# Patient Record
Sex: Male | Born: 1955 | Race: Black or African American | Hispanic: No | Marital: Single | Smoking: Current every day smoker
Health system: Southern US, Community
[De-identification: ages and names within clinical notes are randomized; demographics above are authoritative.]

## PROBLEM LIST (undated history)

## (undated) DIAGNOSIS — I10 Essential (primary) hypertension: Secondary | ICD-10-CM

## (undated) DIAGNOSIS — I513 Intracardiac thrombosis, not elsewhere classified: Secondary | ICD-10-CM

## (undated) DIAGNOSIS — I480 Paroxysmal atrial fibrillation: Secondary | ICD-10-CM

## (undated) DIAGNOSIS — I7101 Dissection of ascending aorta: Secondary | ICD-10-CM

## (undated) DIAGNOSIS — I5022 Chronic systolic (congestive) heart failure: Secondary | ICD-10-CM

## (undated) DIAGNOSIS — Z950 Presence of cardiac pacemaker: Secondary | ICD-10-CM

## (undated) DIAGNOSIS — G8220 Paraplegia, unspecified: Secondary | ICD-10-CM

## (undated) DIAGNOSIS — E782 Mixed hyperlipidemia: Secondary | ICD-10-CM

## (undated) DIAGNOSIS — I739 Peripheral vascular disease, unspecified: Secondary | ICD-10-CM

## (undated) DIAGNOSIS — R32 Unspecified urinary incontinence: Secondary | ICD-10-CM

## (undated) HISTORY — PX: HERNIA REPAIR: SHX51

---

## 2016-06-29 DIAGNOSIS — I639 Cerebral infarction, unspecified: Secondary | ICD-10-CM

## 2016-06-29 HISTORY — DX: Cerebral infarction, unspecified: I63.9

## 2016-07-03 ENCOUNTER — Inpatient Hospital Stay
Admission: EM | Admit: 2016-07-03 | Discharge: 2016-07-05 | DRG: 066 | Disposition: A | Discharge status: Home / Self Care | Payer: Self-pay | Attending: Internal Medicine | Admitting: Internal Medicine

## 2016-07-03 ENCOUNTER — Emergency Department: Payer: Self-pay

## 2016-07-03 ENCOUNTER — Encounter: Payer: Self-pay | Admitting: Emergency Medicine

## 2016-07-03 ENCOUNTER — Inpatient Hospital Stay: Payer: Self-pay

## 2016-07-03 DIAGNOSIS — R29702 NIHSS score 2: Secondary | ICD-10-CM | POA: Diagnosis present

## 2016-07-03 DIAGNOSIS — W19XXXA Unspecified fall, initial encounter: Secondary | ICD-10-CM | POA: Diagnosis present

## 2016-07-03 DIAGNOSIS — I160 Hypertensive urgency: Secondary | ICD-10-CM | POA: Diagnosis present

## 2016-07-03 DIAGNOSIS — I639 Cerebral infarction, unspecified: Secondary | ICD-10-CM

## 2016-07-03 DIAGNOSIS — R296 Repeated falls: Secondary | ICD-10-CM | POA: Diagnosis present

## 2016-07-03 DIAGNOSIS — M25561 Pain in right knee: Secondary | ICD-10-CM | POA: Diagnosis present

## 2016-07-03 DIAGNOSIS — F17210 Nicotine dependence, cigarettes, uncomplicated: Secondary | ICD-10-CM | POA: Diagnosis present

## 2016-07-03 DIAGNOSIS — I513 Intracardiac thrombosis, not elsewhere classified: Secondary | ICD-10-CM | POA: Diagnosis present

## 2016-07-03 DIAGNOSIS — R55 Syncope and collapse: Secondary | ICD-10-CM

## 2016-07-03 DIAGNOSIS — E876 Hypokalemia: Secondary | ICD-10-CM | POA: Diagnosis present

## 2016-07-03 DIAGNOSIS — I638 Other cerebral infarction: Principal | ICD-10-CM | POA: Diagnosis present

## 2016-07-03 DIAGNOSIS — G8311 Monoplegia of lower limb affecting right dominant side: Secondary | ICD-10-CM | POA: Diagnosis present

## 2016-07-03 DIAGNOSIS — I10 Essential (primary) hypertension: Secondary | ICD-10-CM | POA: Diagnosis present

## 2016-07-03 DIAGNOSIS — Z8249 Family history of ischemic heart disease and other diseases of the circulatory system: Secondary | ICD-10-CM

## 2016-07-03 HISTORY — DX: Essential (primary) hypertension: I10

## 2016-07-03 LAB — CBC
HCT: 44.2 % (ref 40.0–52.0)
Hemoglobin: 15 g/dL (ref 13.0–18.0)
MCH: 32.6 pg (ref 26.0–34.0)
MCHC: 34 g/dL (ref 32.0–36.0)
MCV: 95.7 fL (ref 80.0–100.0)
PLATELETS: 252 10*3/uL (ref 150–440)
RBC: 4.61 MIL/uL (ref 4.40–5.90)
RDW: 13.8 % (ref 11.5–14.5)
WBC: 9.9 10*3/uL (ref 3.8–10.6)

## 2016-07-03 LAB — URINALYSIS, COMPLETE (UACMP) WITH MICROSCOPIC
Bacteria, UA: NONE SEEN
Bilirubin Urine: NEGATIVE
GLUCOSE, UA: NEGATIVE mg/dL
HGB URINE DIPSTICK: NEGATIVE
KETONES UR: NEGATIVE mg/dL
LEUKOCYTES UA: NEGATIVE
Nitrite: NEGATIVE
PH: 7 (ref 5.0–8.0)
Protein, ur: NEGATIVE mg/dL
SQUAMOUS EPITHELIAL / LPF: NONE SEEN
Specific Gravity, Urine: 1.008 (ref 1.005–1.030)

## 2016-07-03 LAB — BASIC METABOLIC PANEL
Anion gap: 9 (ref 5–15)
BUN: 11 mg/dL (ref 6–20)
CALCIUM: 8.8 mg/dL — AB (ref 8.9–10.3)
CHLORIDE: 103 mmol/L (ref 101–111)
CO2: 26 mmol/L (ref 22–32)
CREATININE: 1 mg/dL (ref 0.61–1.24)
Glucose, Bld: 103 mg/dL — ABNORMAL HIGH (ref 65–99)
Potassium: 3.1 mmol/L — ABNORMAL LOW (ref 3.5–5.1)
SODIUM: 138 mmol/L (ref 135–145)

## 2016-07-03 LAB — MAGNESIUM: Magnesium: 2 mg/dL (ref 1.7–2.4)

## 2016-07-03 LAB — TROPONIN I: Troponin I: <0.03 ng/mL (ref <0.03)

## 2016-07-03 MED ORDER — GADOBENATE DIMEGLUMINE 529 MG/ML IV SOLN
15.0000 mL | Freq: Once | INTRAVENOUS | Status: AC | PRN
  Administered 2016-07-03: 15 mL via INTRAVENOUS

## 2016-07-03 MED ORDER — LABETALOL HCL 5 MG/ML IV SOLN
10.0000 mg | Freq: Once | INTRAVENOUS | Status: AC
  Administered 2016-07-03: 10 mg via INTRAVENOUS
  Filled 2016-07-03: qty 4

## 2016-07-03 MED ORDER — POTASSIUM CHLORIDE CRYS ER 20 MEQ PO TBCR
40.0000 meq | EXTENDED_RELEASE_TABLET | Freq: Once | ORAL | Status: AC
  Administered 2016-07-03: 40 meq via ORAL
  Filled 2016-07-03: qty 2

## 2016-07-03 MED ORDER — ATORVASTATIN CALCIUM 20 MG PO TABS
40.0000 mg | ORAL_TABLET | Freq: Every day | ORAL | Status: DC
  Administered 2016-07-03 – 2016-07-04 (×2): 40 mg via ORAL
  Filled 2016-07-03 (×2): qty 2

## 2016-07-03 MED ORDER — HYDROCHLOROTHIAZIDE 25 MG PO TABS: 25.0000 mg | ORAL_TABLET | Freq: Every day | ORAL | 0 refills | Status: DC

## 2016-07-03 MED ORDER — ASPIRIN EC 81 MG PO TBEC
81.0000 mg | DELAYED_RELEASE_TABLET | Freq: Every day | ORAL | Status: DC
  Administered 2016-07-03 – 2016-07-05 (×3): 81 mg via ORAL
  Filled 2016-07-03 (×3): qty 1

## 2016-07-03 MED ORDER — LISINOPRIL 10 MG PO TABS
10.0000 mg | ORAL_TABLET | Freq: Once | ORAL | Status: AC
  Administered 2016-07-03: 10 mg via ORAL
  Filled 2016-07-03: qty 1

## 2016-07-03 MED ORDER — ENOXAPARIN SODIUM 40 MG/0.4ML ~~LOC~~ SOLN
40.0000 mg | SUBCUTANEOUS | Status: DC
  Administered 2016-07-03 – 2016-07-04 (×2): 40 mg via SUBCUTANEOUS
  Filled 2016-07-03 (×3): qty 0.4

## 2016-07-03 MED ORDER — IOPAMIDOL (ISOVUE-370) INJECTION 76%
75.0000 mL | Freq: Once | INTRAVENOUS | Status: AC | PRN
  Administered 2016-07-03: 75 mL via INTRAVENOUS

## 2016-07-03 MED ORDER — ONDANSETRON HCL 4 MG/2ML IJ SOLN
4.0000 mg | Freq: Four times a day (QID) | INTRAMUSCULAR | Status: DC | PRN
  Administered 2016-07-04: 4 mg via INTRAVENOUS
  Filled 2016-07-03: qty 2

## 2016-07-03 MED ORDER — ONDANSETRON HCL 4 MG PO TABS
4.0000 mg | ORAL_TABLET | Freq: Four times a day (QID) | ORAL | Status: DC | PRN
  Administered 2016-07-03: 4 mg via ORAL
  Filled 2016-07-03: qty 1

## 2016-07-03 MED ORDER — LISINOPRIL 10 MG PO TABS
10.0000 mg | ORAL_TABLET | Freq: Every day | ORAL | Status: DC
  Administered 2016-07-03 – 2016-07-05 (×3): 10 mg via ORAL
  Filled 2016-07-03 (×3): qty 1

## 2016-07-03 MED ORDER — HYDRALAZINE HCL 20 MG/ML IJ SOLN
10.0000 mg | Freq: Four times a day (QID) | INTRAMUSCULAR | Status: DC | PRN
  Administered 2016-07-03: 10 mg via INTRAVENOUS
  Filled 2016-07-03: qty 1

## 2016-07-03 MED ORDER — ACETAMINOPHEN 325 MG PO TABS: 650.0000 mg | ORAL_TABLET | Freq: Four times a day (QID) | ORAL | Status: DC | PRN

## 2016-07-03 MED ORDER — POTASSIUM CHLORIDE CRYS ER 20 MEQ PO TBCR
20.0000 meq | EXTENDED_RELEASE_TABLET | Freq: Two times a day (BID) | ORAL | Status: AC
  Administered 2016-07-03 – 2016-07-04 (×4): 20 meq via ORAL
  Filled 2016-07-03 (×4): qty 1

## 2016-07-03 MED ORDER — POTASSIUM CHLORIDE CRYS ER 20 MEQ PO TBCR: 40.0000 meq | EXTENDED_RELEASE_TABLET | Freq: Once | ORAL | Status: DC

## 2016-07-03 MED ORDER — TRAMADOL HCL 50 MG PO TABS
50.0000 mg | ORAL_TABLET | Freq: Once | ORAL | Status: AC
  Administered 2016-07-03: 50 mg via ORAL
  Filled 2016-07-03: qty 1

## 2016-07-03 MED ORDER — METOPROLOL TARTRATE 25 MG PO TABS
25.0000 mg | ORAL_TABLET | Freq: Two times a day (BID) | ORAL | Status: DC
  Administered 2016-07-03 – 2016-07-05 (×4): 25 mg via ORAL
  Filled 2016-07-03 (×4): qty 1

## 2016-07-03 MED ORDER — HYDROCHLOROTHIAZIDE 25 MG PO TABS
25.0000 mg | ORAL_TABLET | Freq: Once | ORAL | Status: AC
  Administered 2016-07-03: 25 mg via ORAL
  Filled 2016-07-03: qty 1

## 2016-07-03 MED ORDER — ACETAMINOPHEN 650 MG RE SUPP: 650.0000 mg | Freq: Four times a day (QID) | RECTAL | Status: DC | PRN

## 2016-07-03 NOTE — ED Notes (Signed)
ED Provider at bedside. 

## 2016-07-03 NOTE — ED Notes (Signed)
Patient reports falling 5 times since last Thursday. Patient reports just walking and his legs just give out. Patient denies hitting head when he falls. Patient fell 2x on right knee tonight. Patient reports it is more the right side that gives out. Reports only pain in the right knee. Denies taking any medication at home for knee pain.

## 2016-07-03 NOTE — ED Triage Notes (Signed)
Pt ambulatory to stat desk in NAD, report multiple falls over past couple days, can't explain what causes him to fall, denies dizziness and lightheadedness.  Reports pain to right knee after falls.

## 2016-07-03 NOTE — ED Notes (Signed)
Patient transported to CT 

## 2016-07-03 NOTE — ED Notes (Signed)
Pt states unable to give urine sample at this time 

## 2016-07-03 NOTE — ED Provider Notes (Addendum)
[facility] Emergency Department Provider Note   ____________________________________________   First MD Initiated Contact with Patient 07/03/16 [phone]     (approximate)  I have reviewed the triage vital signs and the nursing notes.   HISTORY  Chief Complaint Fall and Knee Pain    HPI Mitchell Dixon is a 61 y.o. male who comes into the hospital today with some right knee pain. The patient reports that he has been having troubles with falls ever since Thursday. The patient denies any dizziness but reports that he's fallen 5 times since Thursday. He reports that when he walks his legs just seem to give out on him. The patient reports that he denies hitting his head but he did fall onto his right knee twice overnight. The patient reports that his right leg gives out more than the left and he rates his pain currently a 5 out of 10 in intensity. The patient did not take any medicine before coming in but just came in for evaluation. The patient has not had any headache, nausea, vomiting, shortness of breath or chest pain.   History reviewed. No pertinent past medical history.  There are no active problems to display for this patient.   Past Surgical History:  Procedure Laterality Date  . HERNIA REPAIR      Prior to Admission medications   Medication Sig Start Date End Date Taking? Authorizing Provider  hydrochlorothiazide (HYDRODIURIL) 25 MG tablet Take 1 tablet (25 mg total) by mouth daily. 07/03/16   Rebecka Apley, MD    Allergies Patient has no known allergies.  History reviewed. No pertinent family history.  Social History Social History  Substance Use Topics  . Smoking status: Current Every Day Smoker    Types: Cigarettes  . Smokeless tobacco: Never Used  . Alcohol use No    Review of Systems  Constitutional: No fever/chills Eyes: No visual changes. ENT: No sore throat. Cardiovascular: Denies chest pain. Respiratory: Denies  shortness of breath. Gastrointestinal: No abdominal pain.  No nausea, no vomiting.  No diarrhea.  No constipation. Genitourinary: Negative for dysuria. Musculoskeletal: right knee pain Skin: Negative for rash. Neurological: Frequent falls   ____________________________________________   PHYSICAL EXAM:  VITAL SIGNS: ED Triage Vitals  Enc Vitals Group     BP 07/03/16 0552 (!) 196/120     Pulse Rate 07/03/16 0552 77     Resp 07/03/16 0552 16     Temp 07/03/16 0552 97.8 F (36.6 C)     Temp Source 07/03/16 0552 Oral     SpO2 07/03/16 0552 97 %     Weight 07/03/16 0546 169 lb (76.7 kg)     Height 07/03/16 0546 5\' 10"  (1.778 m)     Head Circumference --      Peak Flow --      Pain Score 07/03/16 0546 5     Pain Loc --      Pain Edu? --      Excl. in GC? --     Constitutional: Alert and oriented. Well appearing and in no acute distress. Eyes: Conjunctivae are normal. PERRL. EOMI. Head: Atraumatic. Nose: No congestion/rhinnorhea. Mouth/Throat: Mucous membranes are moist.  Oropharynx non-erythematous. Neck: No cervical spine tenderness to palpation. Cardiovascular: Normal rate, regular rhythm. Grossly normal heart sounds.  Good peripheral circulation. Respiratory: Normal respiratory effort.  No retractions. Lungs CTAB. Gastrointestinal: Soft and nontender. No distention. positive bowel sounds Musculoskeletal: right knee tenderness to palpation worse on the right with some  pain with valgus stress, no noted swelling    Neurologic:  Normal speech and language. Cranial nerves II through XII are grossly intact with no focal motor none deficits Skin:  Skin is warm, dry and intact.  Psychiatric: Mood and affect are normal.   ____________________________________________   LABS (all labs ordered are listed, but only abnormal results are displayed)  Labs Reviewed  BASIC METABOLIC PANEL - Abnormal; Notable for the following:       Result Value   Potassium 3.1 (*)    Glucose, Bld  103 (*)    Calcium 8.8 (*)    All other components within normal limits  CBC  TROPONIN I  URINALYSIS, COMPLETE (UACMP) WITH MICROSCOPIC   ____________________________________________  EKG  ED ECG REPORT I, Rebecka Apley, the attending physician, personally viewed and interpreted this ECG.   Date: 07/03/2016  EKG Time: 555  Rate: 81  Rhythm: normal sinus rhythm  Axis: normal  Intervals:none  ST&T Change: none  ____________________________________________  RADIOLOGY  CT head Dg right knee ____________________________________________   PROCEDURES  Procedure(s) performed: None  Procedures  Critical Care performed: No  ____________________________________________   INITIAL IMPRESSION / ASSESSMENT AND PLAN / ED COURSE  Pertinent labs & imaging results that were available during my care of the patient were reviewed by me and considered in my medical decision making (see chart for details).  This is a 62 year old male who comes into the hospital today with some right knee pain after a fall. He reports that his leg is giving out on him more over the past few days. I did perform an x-ray of the patient's right knee and it was unremarkable. The patient also had some blood work performed was unremarkable. At this time we're waiting for the patient's urinalysis and a CT scan. His blood pressure is elevated sided give him a dose of tramadol. He denies taking any medication for hypertension. I will give him also a dose of hydrochlorothiazide and some potassium. The patient's care will be signed out to Dr. Shaune Pollack will follow-up the results of the CT scan and urinalysis and disposition the patient.  Clinical Course as of Jul 04 839  Sat Jul 03, 2016  0750 Negative DG Knee Complete 4 Views Right [AW]    Clinical Course User Index [AW] Rebecka Apley, MD     ____________________________________________   FINAL CLINICAL IMPRESSION(S) / ED DIAGNOSES  Final  diagnoses:  Acute pain of right knee  Fall, initial encounter  Hypertension, unspecified type      NEW MEDICATIONS STARTED DURING THIS VISIT:  New Prescriptions   HYDROCHLOROTHIAZIDE (HYDRODIURIL) 25 MG TABLET    Take 1 tablet (25 mg total) by mouth daily.     Note:  This document was prepared using Dragon voice recognition software and may include unintentional dictation errors.    Rebecka Apley, MD 07/03/16 0815    Rebecka Apley, MD 07/03/16 [phone]

## 2016-07-03 NOTE — Discharge Instructions (Signed)
Please follow-up with your primary care physician for further evaluation of this knee pain and this leg weakness. Please also follow-up for evaluation for your hypertension.

## 2016-07-03 NOTE — ED Notes (Signed)
Pt alert and oriented. NAD. Waiting on disposition.

## 2016-07-03 NOTE — ED Notes (Signed)
Patient transported to MRI 

## 2016-07-03 NOTE — ED Provider Notes (Signed)
[facility]  I accepted care from Dr. Zenda Alpers ____________________________________________    LABS (pertinent positives/negatives)  Labs Reviewed  BASIC METABOLIC PANEL - Abnormal; Notable for the following:       Result Value   Potassium 3.1 (*)    Glucose, Bld 103 (*)    Calcium 8.8 (*)    All other components within normal limits  URINALYSIS, COMPLETE (UACMP) WITH MICROSCOPIC - Abnormal; Notable for the following:    Color, Urine STRAW (*)    APPearance CLEAR (*)    All other components within normal limits  CBC  TROPONIN I     ____________________________________________    RADIOLOGY All xrays were viewed by me. Imaging interpreted by radiologist.  CT head without contrast:  IMPRESSION: 1. Advanced but nonspecific cerebral white matter abnormality, mostly subcortical and especially involving the left temporal lobe and bilateral subinsular white matter. Recommend follow-up Brain MRI (without and with contrast preferred) to characterize further. Differential considerations at this time include accelerated small vessel ischemia, sequelae of hypercoagulable state, vasculitis, prior infection, or demyelination. 2. No other acute intracranial abnormality.  MRI brain with and without contrast: Pending ____________________________________________   PROCEDURES  Procedure(s) performed: None  Critical Care performed: None  ____________________________________________   INITIAL IMPRESSION / ASSESSMENT AND PLAN / ED COURSE   Pertinent labs & imaging results that were available during my care of the patient were reviewed by me and considered in my medical decision making (see chart for details).  Mr. Lascelles was signed out to me awaiting repeat blood pressure as well as urinalysis and head CT result.  Head CT result showed no acute findings, but some nonspecific findings with the recommendation for MRI of the brain.  Patient has had  persistent elevated blood pressures despite initially having received hydrochlorothiazide by mouth, followed by lisinopril by mouth. His blood pressure remains near 200/130-140 diastolic.  He is not having headache or neurologic complaints, nor chest pain or breathing issues, however his diastolic is severely elevated and he does not have a primary care doctor for follow-up. I'm concerned about his ability to have close follow-up.  He describes the 2 falls being preceded by just "going out." He is not having any ongoing dizziness, and not sure if his blood pressure would've been related to his nearly passing out/falls.  Out of concern for the possibility of early PRES and persistent severely elevated diastolic bp with no outpatient provider, patient discussed with hospitalist Dr. Letitia Libra for observation admission.  He was given IV labetalol and is ordered to have MRI brain.  CONSULTATIONS: Hospitalist for admission.    Patient / Family / Caregiver informed of clinical course, medical decision-making process, and agree with plan.     ____________________________________________   FINAL CLINICAL IMPRESSION(S) / ED DIAGNOSES  Final diagnoses:  Acute pain of right knee  Fall, initial encounter  Hypertension, unspecified type  Hypertensive urgency  Syncope, unspecified syncope type        Governor Rooks, MD 07/03/16 1313

## 2016-07-03 NOTE — ED Notes (Signed)
Returned from MRI. NAD. Waiting on admit consult

## 2016-07-03 NOTE — H&P (Signed)
[employer] [facility] [place] [facility]   PATIENT NAME: Mitchell Dixon    MR#:  [id number]  DATE OF BIRTH:  [birth year]  DATE OF ADMISSION:  07/03/2016  PRIMARY CARE PHYSICIAN: Patient, No Pcp Per   REQUESTING/REFERRING PHYSICIAN: Dr. Governor Rooks  CHIEF COMPLAINT:   Chief Complaint  Patient presents with  . Fall  . Knee Pain    HISTORY OF PRESENT ILLNESS:  Melio Wasil  is a 61 y.o. male with a known history of Essential hypertension who presents to the hospital due to recurrent falls over the past 3-4 days. Patient says he is right leg gives way and he has fallen multiple times over the past 3-4 days. He does say that his right leg feels a little bit heavier than usual. He denies any headache, blurry vision, nausea, vomiting, chest pain, shortness of breath or any other associated symptoms. Patient presented to the emergency room was noted to be severely hypertensive with systolic blood pressures greater than 200. Patient also underwent a CT scan and MRI of the brain which is consistent with a acute small ischemic CVA and therefore hospitalist services clinic for evaluation.  PAST MEDICAL HISTORY:   Past Medical History:  Diagnosis Date  . Essential hypertension     PAST SURGICAL HISTORY:   Past Surgical History:  Procedure Laterality Date  . HERNIA REPAIR      SOCIAL HISTORY:   Social History  Substance Use Topics  . Smoking status: Current Every Day Smoker    Packs/day: 1.00    Years: 30.00    Types: Cigarettes  . Smokeless tobacco: Never Used  . Alcohol use No    FAMILY HISTORY:   Family History  Problem Relation Age of Onset  . Dementia Mother   . Hypertension Mother   . Pneumonia Father   . Hypertension Sister     DRUG ALLERGIES:  No Known Allergies  REVIEW OF SYSTEMS:   Review of Systems  Constitutional: Negative for fever and weight loss.  HENT: Negative for congestion, nosebleeds and tinnitus.   Eyes: Negative for blurred  vision, double vision and redness.  Respiratory: Negative for cough, hemoptysis and shortness of breath.   Cardiovascular: Negative for chest pain, orthopnea, leg swelling and PND.  Gastrointestinal: Negative for abdominal pain, diarrhea, melena, nausea and vomiting.  Genitourinary: Negative for dysuria, hematuria and urgency.  Musculoskeletal: Positive for falls. Negative for joint pain.  Neurological: Positive for sensory change (Right leg numbness. ). Negative for dizziness, tingling, focal weakness, seizures, weakness and headaches.  Endo/Heme/Allergies: Negative for polydipsia. Does not bruise/bleed easily.  Psychiatric/Behavioral: Negative for depression and memory loss. The patient is not nervous/anxious.     MEDICATIONS AT HOME:   Prior to Admission medications   Medication Sig Start Date End Date Taking? Authorizing Provider  hydrochlorothiazide (HYDRODIURIL) 25 MG tablet Take 1 tablet (25 mg total) by mouth daily. 07/03/16   Rebecka Apley, MD      VITAL SIGNS:  Blood pressure (!) 164/130, pulse 72, temperature 97.8 F (36.6 C), temperature source Oral, resp. rate 20, height 5\' 10"  (1.778 m), weight 76.7 kg (169 lb), SpO2 97 %.  PHYSICAL EXAMINATION:  Physical Exam  GENERAL:  61 y.o.-year-old patient lying in the bed in no acute distress.  EYES: Pupils equal, round, reactive to light and accommodation. No scleral icterus. Extraocular muscles intact.  HEENT: Head atraumatic, normocephalic. Oropharynx and nasopharynx clear. No oropharyngeal erythema, moist oral mucosa  NECK:  Supple, no jugular venous distention.  No thyroid enlargement, no tenderness.  LUNGS: Normal breath sounds bilaterally, no wheezing, rales, rhonchi. No use of accessory muscles of respiration.  CARDIOVASCULAR: S1, S2 RRR. No murmurs, rubs, gallops, clicks.  ABDOMEN: Soft, nontender, nondistended. Bowel sounds present. No organomegaly or mass.  EXTREMITIES: No pedal edema, cyanosis, or clubbing. + 2  pedal & radial pulses b/l.   NEUROLOGIC: Cranial nerves II through XII are intact. No focal Motor or sensory deficits appreciated b/l PSYCHIATRIC: The patient is alert and oriented x 3.  SKIN: No obvious rash, lesion, or ulcer.   LABORATORY PANEL:   CBC  Recent Labs Lab 07/03/16 0554  WBC 9.9  HGB 15.0  HCT 44.2  PLT 252   ------------------------------------------------------------------------------------------------------------------  Chemistries   Recent Labs Lab 07/03/16 0554  NA 138  K 3.1*  CL 103  CO2 26  GLUCOSE 103*  BUN 11  CREATININE 1.00  CALCIUM 8.8*   ------------------------------------------------------------------------------------------------------------------  Cardiac Enzymes  Recent Labs Lab 07/03/16 0554  TROPONINI <0.03   ------------------------------------------------------------------------------------------------------------------  RADIOLOGY:  Ct Head Wo Contrast  Result Date: 07/03/2016 CLINICAL DATA:  61 year old male with multiple falls recently. Hypertensive on presentation. EXAM: CT HEAD WITHOUT CONTRAST TECHNIQUE: Contiguous axial images were obtained from the base of the skull through the vertex without intravenous contrast. COMPARISON:  None. FINDINGS: Brain: No acute intracranial hemorrhage identified. No ventriculomegaly. Patchy and confluent nonspecific subcortical white matter hypodensity, with insula and anterior left temporal lobe involvement most prominent. Scattered frontal lobe involvement. There is some left corona radiata involvement. No definite cortical hypodensity or cytotoxic edema to suggest acute cortically based infarct. No midline shift, mass effect, or evidence of intracranial mass lesion. 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 Mild Calcified atherosclerosis at the skull base. No suspicious intracranial vascular hyperdensity. Skull: Intact.  No osseous  abnormality identified. Sinuses/Orbits: Clear. Other: No scalp hematoma identified. Visualized orbits and scalp soft tissues are within normal limits. IMPRESSION: 1. Advanced but nonspecific cerebral white matter abnormality, mostly subcortical and especially involving the left temporal lobe and bilateral subinsular white matter. Recommend follow-up Brain MRI (without and with contrast preferred) to characterize further. Differential considerations at this time include accelerated small vessel ischemia, sequelae of hypercoagulable state, vasculitis, prior infection, or demyelination. 2. No other acute intracranial abnormality. Electronically Signed   By: Odessa Fleming M.D.   On: 07/03/2016 08:42   Mr Brain W And Wo Contrast  Result Date: 07/03/2016 CLINICAL DATA:  Hypertension and abnormal head CT. EXAM: MRI HEAD WITHOUT AND WITH CONTRAST TECHNIQUE: Multiplanar, multiecho pulse sequences of the brain and surrounding structures were obtained without and with intravenous contrast. CONTRAST:  15mL MULTIHANCE GADOBENATE DIMEGLUMINE 529 MG/ML IV SOLN COMPARISON:  Head CT from earlier today FINDINGS: Brain: 11 mm focus of restricted diffusion in the left corona radiata and caudate body. Remote lacunar infarcts in the left caudate head and right internal capsule. There is patchy age advanced FLAIR hyperintensity in the cerebral white matter. There is nonspecific location, although it is notable anterior temporal lobe involvement. No infratentorial signal abnormality or predominant ovoid radiating periventricular involvement. No hemorrhage, hydrocephalus, or swelling. No abnormal intracranial enhancement Vascular: Major flow voids are preserved Skull and upper cervical spine: Negative Sinuses/Orbits: Negative IMPRESSION: 1. 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 2. Moderate for age white matter disease that is nonspecific. Patient's vascular risk factors and remote lacunar infarcts implies  chronic small vessel ischemia. 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  Pattern would be atypical for multiple sclerosis. Electronically Signed   By: Marnee Spring M.D.   On: 07/03/2016 14:12   Dg Knee Complete 4 Views Right  Result Date: 07/03/2016 CLINICAL DATA:  Larey Seat multiple times over the past 2 days. EXAM: RIGHT KNEE - COMPLETE 4+ VIEW COMPARISON:  None. FINDINGS: No evidence of fracture, dislocation, or joint effusion. No evidence of arthropathy or other focal bone abnormality. Soft tissues are unremarkable. IMPRESSION: Negative. Electronically Signed   By: Ellery Plunk M.D.   On: 07/03/2016 06:24     IMPRESSION AND PLAN:   62 year old male with past medical history of hypertension who presents to the hospital due to recurrent falls and noted to have accelerated hypertension with an acute CVA.  1. Acute CVA-patient has a acute CVA on MRI of the brain and the left corona radiata and caudate body. This correlates with patient's right leg weakness and recurrent falls. -We'll start the patient aspirin, high-dose intensity statin. -Gets CTA head and neck, a neurology consult. - Check a two-dimensional echocardiogram.  2. Hypertensive urgency- start the pt. On Oral Metoprolol, Lisinopril.  - PRN Hydralazine.  Goal SBP 170 and diastolic BP in the 90's.   3. Hypokalemia - will place on Oral potassium supplements.  - repeat level in a.m. Check Mg. Level.    All the records are reviewed and case discussed with ED provider. Management plans discussed with the patient, family and they are in agreement.  CODE STATUS: Full code  TOTAL TIME TAKING CARE OF THIS PATIENT: 40 minutes.    Houston Siren M.D on 07/03/2016 at 2:52 PM  Between 7am to 6pm - Pager - [phone]  After 6pm go to [email or web address] - password [email or web address]  [facility]  Office  [phone]  CC: Primary care physician; Patient, No Pcp Per

## 2016-07-04 ENCOUNTER — Inpatient Hospital Stay
Admit: 2016-07-04 | Discharge: 2016-07-04 | Disposition: A | Discharge status: Home / Self Care | Payer: Self-pay | Attending: Specialist | Admitting: Specialist

## 2016-07-04 DIAGNOSIS — I10 Essential (primary) hypertension: Secondary | ICD-10-CM

## 2016-07-04 DIAGNOSIS — I6300 Cerebral infarction due to thrombosis of unspecified precerebral artery: Secondary | ICD-10-CM

## 2016-07-04 LAB — ECHOCARDIOGRAM COMPLETE
HEIGHTINCHES: 70 in
WEIGHTICAEL: 2704 [oz_av]

## 2016-07-04 LAB — POTASSIUM: POTASSIUM: 3.9 mmol/L (ref 3.5–5.1)

## 2016-07-04 MED ORDER — SODIUM CHLORIDE 0.9% FLUSH
3.0000 mL | Freq: Two times a day (BID) | INTRAVENOUS | Status: DC
  Administered 2016-07-04 – 2016-07-05 (×2): 3 mL via INTRAVENOUS

## 2016-07-04 MED ORDER — SODIUM CHLORIDE 0.9% FLUSH: 3.0000 mL | INTRAVENOUS | Status: DC | PRN

## 2016-07-04 NOTE — Consult Note (Signed)
Referring Physician: Dr. Allena Katz     Chief Complaint: R Leg weakness   HPI: Moreland Degenova is an 61 y.o. male known history of Essential hypertension who presents to the hospital due to recurrent falls over the past 3-4 days. Patient says he is right leg give.  Patient presented to the emergency room was noted to be severely hypertensive with systolic blood pressures greater than 200. Pt has R corona radiata stroke.     Date last known well: 3-4 days ago  Time last known well: Unable to determine tPA Given: No: last normal 3-4 days ago.   Past Medical History:  Diagnosis Date  . Essential hypertension     Past Surgical History:  Procedure Laterality Date  . HERNIA REPAIR      Family History  Problem Relation Age of Onset  . Dementia Mother   . Hypertension Mother   . Pneumonia Father   . Hypertension Sister    Social History:  reports that he has been smoking Cigarettes.  He has a 30.00 pack-year smoking history. He has never used smokeless tobacco. He reports that he does not drink alcohol or use drugs.  Allergies: No Known Allergies  Medications: I have reviewed the patient's current medications.  ROS: History obtained from the patient  General ROS: negative for - chills, fatigue, fever, night sweats, weight gain or weight loss Psychological ROS: negative for - behavioral disorder, hallucinations, memory difficulties, mood swings or suicidal ideation Ophthalmic ROS: negative for - blurry vision, double vision, eye pain or loss of vision ENT ROS: negative for - epistaxis, nasal discharge, oral lesions, sore throat, tinnitus or vertigo Allergy and Immunology ROS: negative for - hives or itchy/watery eyes Hematological and Lymphatic ROS: negative for - bleeding problems, bruising or swollen lymph nodes Endocrine ROS: negative for - galactorrhea, hair pattern changes, polydipsia/polyuria or temperature intolerance Respiratory ROS: negative for - cough, hemoptysis,  shortness of breath or wheezing Cardiovascular ROS: negative for - chest pain, dyspnea on exertion, edema or irregular heartbeat Gastrointestinal ROS: negative for - abdominal pain, diarrhea, hematemesis, nausea/vomiting or stool incontinence Genito-Urinary ROS: negative for - dysuria, hematuria, incontinence or urinary frequency/urgency Musculoskeletal ROS: negative for - joint swelling or muscular weakness Neurological ROS: as noted in HPI Dermatological ROS: negative for rash and skin lesion changes  Physical Examination: Blood pressure (!) 142/101, pulse 84, temperature 98.3 F (36.8 C), temperature source Oral, resp. rate 16, height 5\' 10"  (1.778 m), weight 76.7 kg (169 lb), SpO2 96 %.   Neurological Examination   Mental Status: Alert, oriented, thought content appropriate.  Speech fluent without evidence of aphasia.  Able to follow 3 step commands without difficulty. Cranial Nerves: II: Discs flat bilaterally; Visual fields grossly normal, pupils equal, round, reactive to light and accommodation III,IV, VI: ptosis not present, extra-ocular motions intact bilaterally V,VII: smile symmetric, facial light touch sensation normal bilaterally VIII: hearing normal bilaterally IX,X: gag reflex present XI: bilateral shoulder shrug XII: midline tongue extension Motor: Right : Upper extremity   5/5    Left:     Upper extremity   5/5  Lower extremity   4+/5     Lower extremity   5/5 Tone and bulk:normal tone throughout; no atrophy noted Sensory: Pinprick and light touch intact throughout, bilaterally Deep Tendon Reflexes: 1+ and symmetric throughout Plantars: Right: downgoing   Left: downgoing Cerebellar: normal finger-to-nose, normal rapid alternating movements and normal heel-to-shin test Gait: not tested      Laboratory Studies:  Basic Metabolic  Panel:  Recent Labs Lab 07/03/16 0554 07/04/16 1019  NA 138  --   K 3.1* 3.9  CL 103  --   CO2 26  --   GLUCOSE 103*  --    BUN 11  --   CREATININE 1.00  --   CALCIUM 8.8*  --   MG 2.0  --     Liver Function Tests: No results for input(s): AST, ALT, ALKPHOS, BILITOT, PROT, ALBUMIN in the last 168 hours. No results for input(s): LIPASE, AMYLASE in the last 168 hours. No results for input(s): AMMONIA in the last 168 hours.  CBC:  Recent Labs Lab 07/03/16 0554  WBC 9.9  HGB 15.0  HCT 44.2  MCV 95.7  PLT 252    Cardiac Enzymes:  Recent Labs Lab 07/03/16 0554  TROPONINI <0.03    BNP: Invalid input(s): POCBNP  CBG: No results for input(s): GLUCAP in the last 168 hours.  Microbiology: No results found for this or any previous visit.  Coagulation Studies: No results for input(s): LABPROT, INR in the last 72 hours.  Urinalysis:  Recent Labs Lab 07/03/16 1231  COLORURINE STRAW*  LABSPEC 1.008  PHURINE 7.0  GLUCOSEU NEGATIVE  HGBUR NEGATIVE  BILIRUBINUR NEGATIVE  KETONESUR NEGATIVE  PROTEINUR NEGATIVE  NITRITE NEGATIVE  LEUKOCYTESUR NEGATIVE    Lipid Panel: No results found for: CHOL, TRIG, HDL, CHOLHDL, VLDL, LDLCALC  HgbA1C: No results found for: HGBA1C  Urine Drug Screen:  No results found for: LABOPIA, COCAINSCRNUR, LABBENZ, AMPHETMU, THCU, LABBARB  Alcohol Level: No results for input(s): ETH in the last 168 hours.  Other results: EKG: normal EKG, normal sinus rhythm, unchanged from previous tracings.  Imaging: Ct Angio Head W Or Wo Contrast  Result Date: 07/03/2016 CLINICAL DATA:  CVA. EXAM: CT ANGIOGRAPHY HEAD AND NECK TECHNIQUE: Multidetector CT imaging of the head and neck was performed using the standard protocol during bolus administration of intravenous contrast. Multiplanar CT image reconstructions and MIPs were obtained to evaluate the vascular anatomy. Carotid stenosis measurements (when applicable) are obtained utilizing NASCET criteria, using the distal internal carotid diameter as the denominator. CONTRAST:  75 cc Isovue 370 intravenous COMPARISON:  Head CT  and brain MRI earlier today FINDINGS: CTA NECK FINDINGS Aortic arch: Widening of the arch due to ductus bump. Three vessel branching but arch origin of the left vertebral artery and common origin of the brachiocephalic and left common carotid. No acute finding. Subtle ridge like structure along the left subclavian artery of indeterminate significance. More distal mild wasting is at the first rib crossing. Right carotid system: Smooth and widely patent. No definitive atherosclerosis. Left carotid system: Widely patent. Minimal undulation of the mid ICA is attributed to curvature rather than beading. No definitive atherosclerosis. Vertebral arteries: Right dominant vertebral artery. No brachiocephalic or right subclavian stenosis. The small left vertebral artery ends in PICA and originates from the arch. Both vessels are smooth and widely patent. Skeleton: No acute or aggressive finding Other neck: No evidence of mass or inflammation. Upper chest: Centrilobular emphysema. Review of the MIP images confirms the above findings CTA HEAD FINDINGS Anterior circulation: No noted atherosclerosis, stenosis, or beading. Hypoplastic right A1 segment with large right posterior communicating artery. Negative for aneurysm Posterior circulation: Strongly dominant right vertebral artery. Left vertebral artery ends in PICA. Basilar and vertebral arteries are smooth and widely patent. Large posterior communicating artery on the right. No branch occlusion or flow limiting stenosis. Negative for aneurysm. Venous sinuses: Patent Anatomic variants: Described above. Delayed  phase: No abnormal intracranial enhancement. Review of the MIP images confirms the above findings IMPRESSION: No acute arterial finding.  No noted atherosclerosis or stenosis. Electronically Signed   By: Marnee Spring M.D.   On: 07/03/2016 19:34   Ct Head Wo Contrast  Result Date: 07/03/2016 CLINICAL DATA:  61 year old male with multiple falls recently. Hypertensive  on presentation. EXAM: CT HEAD WITHOUT CONTRAST TECHNIQUE: Contiguous axial images were obtained from the base of the skull through the vertex without intravenous contrast. COMPARISON:  None. FINDINGS: Brain: No acute intracranial hemorrhage identified. No ventriculomegaly. Patchy and confluent nonspecific subcortical white matter hypodensity, with insula and anterior left temporal lobe involvement most prominent. Scattered frontal lobe involvement. There is some left corona radiata involvement. No definite cortical hypodensity or cytotoxic edema to suggest acute cortically based infarct. No midline shift, mass effect, or evidence of intracranial mass lesion. 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 Mild Calcified atherosclerosis at the skull base. No suspicious intracranial vascular hyperdensity. Skull: Intact.  No osseous abnormality identified. Sinuses/Orbits: Clear. Other: No scalp hematoma identified. Visualized orbits and scalp soft tissues are within normal limits. IMPRESSION: 1. Advanced but nonspecific cerebral white matter abnormality, mostly subcortical and especially involving the left temporal lobe and bilateral subinsular white matter. Recommend follow-up Brain MRI (without and with contrast preferred) to characterize further. Differential considerations at this time include accelerated small vessel ischemia, sequelae of hypercoagulable state, vasculitis, prior infection, or demyelination. 2. No other acute intracranial abnormality. Electronically Signed   By: Odessa Fleming M.D.   On: 07/03/2016 08:42   Ct Angio Neck W Or Wo Contrast  Result Date: 07/03/2016 CLINICAL DATA:  CVA. EXAM: CT ANGIOGRAPHY HEAD AND NECK TECHNIQUE: Multidetector CT imaging of the head and neck was performed using the standard protocol during bolus administration of intravenous contrast. Multiplanar CT image reconstructions and MIPs were obtained to evaluate the vascular  anatomy. Carotid stenosis measurements (when applicable) are obtained utilizing NASCET criteria, using the distal internal carotid diameter as the denominator. CONTRAST:  75 cc Isovue 370 intravenous COMPARISON:  Head CT and brain MRI earlier today FINDINGS: CTA NECK FINDINGS Aortic arch: Widening of the arch due to ductus bump. Three vessel branching but arch origin of the left vertebral artery and common origin of the brachiocephalic and left common carotid. No acute finding. Subtle ridge like structure along the left subclavian artery of indeterminate significance. More distal mild wasting is at the first rib crossing. Right carotid system: Smooth and widely patent. No definitive atherosclerosis. Left carotid system: Widely patent. Minimal undulation of the mid ICA is attributed to curvature rather than beading. No definitive atherosclerosis. Vertebral arteries: Right dominant vertebral artery. No brachiocephalic or right subclavian stenosis. The small left vertebral artery ends in PICA and originates from the arch. Both vessels are smooth and widely patent. Skeleton: No acute or aggressive finding Other neck: No evidence of mass or inflammation. Upper chest: Centrilobular emphysema. Review of the MIP images confirms the above findings CTA HEAD FINDINGS Anterior circulation: No noted atherosclerosis, stenosis, or beading. Hypoplastic right A1 segment with large right posterior communicating artery. Negative for aneurysm Posterior circulation: Strongly dominant right vertebral artery. Left vertebral artery ends in PICA. Basilar and vertebral arteries are smooth and widely patent. Large posterior communicating artery on the right. No branch occlusion or flow limiting stenosis. Negative for aneurysm. Venous sinuses: Patent Anatomic variants: Described above. Delayed phase: No abnormal intracranial enhancement. Review of the MIP images confirms the above findings IMPRESSION:  No acute arterial finding.  No noted  atherosclerosis or stenosis. Electronically Signed   By: Marnee Spring M.D.   On: 07/03/2016 19:34   Mr Laqueta Jean And Wo Contrast  Result Date: 07/03/2016 CLINICAL DATA:  Hypertension and abnormal head CT. EXAM: MRI HEAD WITHOUT AND WITH CONTRAST TECHNIQUE: Multiplanar, multiecho pulse sequences of the brain and surrounding structures were obtained without and with intravenous contrast. CONTRAST:  15mL MULTIHANCE GADOBENATE DIMEGLUMINE 529 MG/ML IV SOLN COMPARISON:  Head CT from earlier today FINDINGS: Brain: 11 mm focus of restricted diffusion in the left corona radiata and caudate body. Remote lacunar infarcts in the left caudate head and right internal capsule. There is patchy age advanced FLAIR hyperintensity in the cerebral white matter. There is nonspecific location, although it is notable anterior temporal lobe involvement. No infratentorial signal abnormality or predominant ovoid radiating periventricular involvement. No hemorrhage, hydrocephalus, or swelling. No abnormal intracranial enhancement Vascular: Major flow voids are preserved Skull and upper cervical spine: Negative Sinuses/Orbits: Negative IMPRESSION: 1. 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 2. Moderate for age white matter disease that is nonspecific. Patient's vascular risk factors and remote lacunar infarcts implies chronic small vessel ischemia. 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 Pattern would be atypical for multiple sclerosis. Electronically Signed   By: Marnee Spring M.D.   On: 07/03/2016 14:12   Dg Knee Complete 4 Views Right  Result Date: 07/03/2016 CLINICAL DATA:  Larey Seat multiple times over the past 2 days. EXAM: RIGHT KNEE - COMPLETE 4+ VIEW COMPARISON:  None. FINDINGS: No evidence of fracture, dislocation, or joint effusion. No evidence of arthropathy or other focal bone abnormality. Soft tissues are unremarkable. IMPRESSION: Negative. Electronically Signed   By:  Ellery Plunk M.D.   On: 07/03/2016 06:24    Assessment: 61 y.o. male known history of Essential hypertension who presents to the hospital due to recurrent falls over the past 3-4 days. Patient says he is right leg give.  Patient presented to the emergency room was noted to be severely hypertensive with systolic blood pressures greater than 200. Pt has R corona radiata stroke.   Stroke Risk Factors - hypertension  Plan: - ASA started was non any anti platelet therapy prior - Stroke likely related to small vessel dz.  - MRI likely significant white matter changes due to chronic HTN rather then [place] - pt/ot - echo - likely d/c tomorrow  07/04/2016, 12:51 PM

## 2016-07-04 NOTE — Progress Notes (Signed)
[facility] - [place] at [facility]   PATIENT NAME: Mitchell Dixon    MR#:  [id number]  DATE OF BIRTH:  January 13, 1956  SUBJECTIVE:   Came in with falls and right leg heaviness REVIEW OF SYSTEMS:   Review of Systems  Constitutional: Negative for chills, fever and weight loss.  HENT: Negative for ear discharge, ear pain and nosebleeds.   Eyes: Negative for blurred vision, pain and discharge.  Respiratory: Negative for sputum production, shortness of breath, wheezing and stridor.   Cardiovascular: Negative for chest pain, palpitations, orthopnea and PND.  Gastrointestinal: Negative for abdominal pain, diarrhea, nausea and vomiting.  Genitourinary: Negative for frequency and urgency.  Musculoskeletal: Negative for back pain and joint pain.  Neurological: Positive for weakness. Negative for sensory change, speech change and focal weakness.  Psychiatric/Behavioral: Negative for depression and hallucinations. The patient is not nervous/anxious.    Tolerating Diet:yes Tolerating PT: pending  DRUG ALLERGIES:  No Known Allergies  VITALS:  Blood pressure (!) 172/101, pulse 73, temperature 97.9 F (36.6 C), resp. rate 16, height 5\' 10"  (1.778 m), weight 76.7 kg (169 lb), SpO2 96 %.  PHYSICAL EXAMINATION:   Physical Exam  GENERAL:  61 y.o.-year-old patient lying in the bed with no acute distress.  EYES: Pupils equal, round, reactive to light and accommodation. No scleral icterus. Extraocular muscles intact.  HEENT: Head atraumatic, normocephalic. Oropharynx and nasopharynx clear.  NECK:  Supple, no jugular venous distention. No thyroid enlargement, no tenderness.  LUNGS: Normal breath sounds bilaterally, no wheezing, rales, rhonchi. No use of accessory muscles of respiration.  CARDIOVASCULAR: S1, S2 normal. No murmurs, rubs, or gallops.  ABDOMEN: Soft, nontender, nondistended. Bowel sounds present. No organomegaly or mass.  EXTREMITIES: No cyanosis, clubbing or  edema b/l.    NEUROLOGIC: Cranial nerves II through XII are intact. No focal Motor or sensory deficits b/l.   PSYCHIATRIC:  patient is alert and oriented x 3.  SKIN: No obvious rash, lesion, or ulcer.   LABORATORY PANEL:  CBC  Recent Labs Lab 07/03/16 0554  WBC 9.9  HGB 15.0  HCT 44.2  PLT 252    Chemistries   Recent Labs Lab 07/03/16 0554 07/04/16 1019  NA 138  --   K 3.1* 3.9  CL 103  --   CO2 26  --   GLUCOSE 103*  --   BUN 11  --   CREATININE 1.00  --   CALCIUM 8.8*  --   MG 2.0  --    Cardiac Enzymes  Recent Labs Lab 07/03/16 0554  TROPONINI <0.03   RADIOLOGY:  Ct Angio Head W Or Wo Contrast  Result Date: 07/03/2016 CLINICAL DATA:  CVA. EXAM: CT ANGIOGRAPHY HEAD AND NECK TECHNIQUE: Multidetector CT imaging of the head and neck was performed using the standard protocol during bolus administration of intravenous contrast. Multiplanar CT image reconstructions and MIPs were obtained to evaluate the vascular anatomy. Carotid stenosis measurements (when applicable) are obtained utilizing NASCET criteria, using the distal internal carotid diameter as the denominator. CONTRAST:  75 cc Isovue 370 intravenous COMPARISON:  Head CT and brain MRI earlier today FINDINGS: CTA NECK FINDINGS Aortic arch: Widening of the arch due to ductus bump. Three vessel branching but arch origin of the left vertebral artery and common origin of the brachiocephalic and left common carotid. No acute finding. Subtle ridge like structure along the left subclavian artery of indeterminate significance. More distal mild wasting is at the first rib crossing. Right carotid system: Smooth  and widely patent. No definitive atherosclerosis. Left carotid system: Widely patent. Minimal undulation of the mid ICA is attributed to curvature rather than beading. No definitive atherosclerosis. Vertebral arteries: Right dominant vertebral artery. No brachiocephalic or right subclavian stenosis. The small left vertebral  artery ends in PICA and originates from the arch. Both vessels are smooth and widely patent. Skeleton: No acute or aggressive finding Other neck: No evidence of mass or inflammation. Upper chest: Centrilobular emphysema. Review of the MIP images confirms the above findings CTA HEAD FINDINGS Anterior circulation: No noted atherosclerosis, stenosis, or beading. Hypoplastic right A1 segment with large right posterior communicating artery. Negative for aneurysm Posterior circulation: Strongly dominant right vertebral artery. Left vertebral artery ends in PICA. Basilar and vertebral arteries are smooth and widely patent. Large posterior communicating artery on the right. No branch occlusion or flow limiting stenosis. Negative for aneurysm. Venous sinuses: Patent Anatomic variants: Described above. Delayed phase: No abnormal intracranial enhancement. Review of the MIP images confirms the above findings IMPRESSION: No acute arterial finding.  No noted atherosclerosis or stenosis. Electronically Signed   By: Marnee Spring M.D.   On: 07/03/2016 19:34   Ct Head Wo Contrast  Result Date: 07/03/2016 CLINICAL DATA:  60 year old male with multiple falls recently. Hypertensive on presentation. EXAM: CT HEAD WITHOUT CONTRAST TECHNIQUE: Contiguous axial images were obtained from the base of the skull through the vertex without intravenous contrast. COMPARISON:  None. FINDINGS: Brain: No acute intracranial hemorrhage identified. No ventriculomegaly. Patchy and confluent nonspecific subcortical white matter hypodensity, with insula and anterior left temporal lobe involvement most prominent. Scattered frontal lobe involvement. There is some left corona radiata involvement. No definite cortical hypodensity or cytotoxic edema to suggest acute cortically based infarct. No midline shift, mass effect, or evidence of intracranial mass lesion. 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 Mild Calcified atherosclerosis at the skull base. No suspicious intracranial vascular hyperdensity. Skull: Intact.  No osseous abnormality identified. Sinuses/Orbits: Clear. Other: No scalp hematoma identified. Visualized orbits and scalp soft tissues are within normal limits. IMPRESSION: 1. Advanced but nonspecific cerebral white matter abnormality, mostly subcortical and especially involving the left temporal lobe and bilateral subinsular white matter. Recommend follow-up Brain MRI (without and with contrast preferred) to characterize further. Differential considerations at this time include accelerated small vessel ischemia, sequelae of hypercoagulable state, vasculitis, prior infection, or demyelination. 2. No other acute intracranial abnormality. Electronically Signed   By: Odessa Fleming M.D.   On: 07/03/2016 08:42   Ct Angio Neck W Or Wo Contrast  Result Date: 07/03/2016 CLINICAL DATA:  CVA. EXAM: CT ANGIOGRAPHY HEAD AND NECK TECHNIQUE: Multidetector CT imaging of the head and neck was performed using the standard protocol during bolus administration of intravenous contrast. Multiplanar CT image reconstructions and MIPs were obtained to evaluate the vascular anatomy. Carotid stenosis measurements (when applicable) are obtained utilizing NASCET criteria, using the distal internal carotid diameter as the denominator. CONTRAST:  75 cc Isovue 370 intravenous COMPARISON:  Head CT and brain MRI earlier today FINDINGS: CTA NECK FINDINGS Aortic arch: Widening of the arch due to ductus bump. Three vessel branching but arch origin of the left vertebral artery and common origin of the brachiocephalic and left common carotid. No acute finding. Subtle ridge like structure along the left subclavian artery of indeterminate significance. More distal mild wasting is at the first rib crossing. Right carotid system: Smooth and widely patent. No definitive atherosclerosis. Left carotid system: Widely patent. Minimal  undulation of  the mid ICA is attributed to curvature rather than beading. No definitive atherosclerosis. Vertebral arteries: Right dominant vertebral artery. No brachiocephalic or right subclavian stenosis. The small left vertebral artery ends in PICA and originates from the arch. Both vessels are smooth and widely patent. Skeleton: No acute or aggressive finding Other neck: No evidence of mass or inflammation. Upper chest: Centrilobular emphysema. Review of the MIP images confirms the above findings CTA HEAD FINDINGS Anterior circulation: No noted atherosclerosis, stenosis, or beading. Hypoplastic right A1 segment with large right posterior communicating artery. Negative for aneurysm Posterior circulation: Strongly dominant right vertebral artery. Left vertebral artery ends in PICA. Basilar and vertebral arteries are smooth and widely patent. Large posterior communicating artery on the right. No branch occlusion or flow limiting stenosis. Negative for aneurysm. Venous sinuses: Patent Anatomic variants: Described above. Delayed phase: No abnormal intracranial enhancement. Review of the MIP images confirms the above findings IMPRESSION: No acute arterial finding.  No noted atherosclerosis or stenosis. Electronically Signed   By: Marnee Spring M.D.   On: 07/03/2016 19:34   Mr Laqueta Jean And Wo Contrast  Result Date: 07/03/2016 CLINICAL DATA:  Hypertension and abnormal head CT. EXAM: MRI HEAD WITHOUT AND WITH CONTRAST TECHNIQUE: Multiplanar, multiecho pulse sequences of the brain and surrounding structures were obtained without and with intravenous contrast. CONTRAST:  15mL MULTIHANCE GADOBENATE DIMEGLUMINE 529 MG/ML IV SOLN COMPARISON:  Head CT from earlier today FINDINGS: Brain: 11 mm focus of restricted diffusion in the left corona radiata and caudate body. Remote lacunar infarcts in the left caudate head and right internal capsule. There is patchy age advanced FLAIR hyperintensity in the cerebral white matter.  There is nonspecific location, although it is notable anterior temporal lobe involvement. No infratentorial signal abnormality or predominant ovoid radiating periventricular involvement. No hemorrhage, hydrocephalus, or swelling. No abnormal intracranial enhancement Vascular: Major flow voids are preserved Skull and upper cervical spine: Negative Sinuses/Orbits: Negative IMPRESSION: 1. 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 2. Moderate for age white matter disease that is nonspecific. Patient's vascular risk factors and remote lacunar infarcts implies chronic small vessel ischemia. 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 Pattern would be atypical for multiple sclerosis. Electronically Signed   By: Marnee Spring M.D.   On: 07/03/2016 14:12   Dg Knee Complete 4 Views Right  Result Date: 07/03/2016 CLINICAL DATA:  Larey Seat multiple times over the past 2 days. EXAM: RIGHT KNEE - COMPLETE 4+ VIEW COMPARISON:  None. FINDINGS: No evidence of fracture, dislocation, or joint effusion. No evidence of arthropathy or other focal bone abnormality. Soft tissues are unremarkable. IMPRESSION: Negative. Electronically Signed   By: Ellery Plunk M.D.   On: 07/03/2016 06:24   ASSESSMENT AND PLAN:  61 year old male with past medical history of hypertension who presents to the hospital due to recurrent falls and noted to have accelerated hypertension with an acute CVA.  1. Acute CVA-patient has a acute CVA on MRI of the brain and the left corona radiata and caudate body. This correlates with patient's right leg weakness and recurrent falls. -cont aspirin, high-dose intensity statin. -Gets CTA head and neck negative -appreciate neurology consult. - Check a two-dimensional echocardiogram.  2. Hypertensive urgency- start the pt. On Oral Metoprolol, Lisinopril.  - PRN Hydralazine.  Goal SBP 170 and diastolic BP in the 90's.   3. Hypokalemia - will place on Oral  potassium supplements.    Case discussed with Care Management/Social Worker. Management plans discussed with  the patient, family and they are in agreement.  CODE STATUS: full  DVT Prophylaxis: lovenox  TOTAL TIME TAKING CARE OF THIS PATIENT: *30* minutes.  >50% time spent on counselling and coordination of care  POSSIBLE D/C IN 1-2 DAYS, DEPENDING ON CLINICAL CONDITION.  Note: This dictation was prepared with Dragon dictation along with smaller phrase technology. Any transcriptional errors that result from this process are unintentional.  Janyla Biscoe M.D on 07/04/2016 at 2:11 PM  Between 7am to 6pm - Pager - [phone]  After 6pm go to [email or web address] - password [email or web address]  [facility]  Office  [phone]  CC: Primary care physician; Patient, No Pcp Per

## 2016-07-04 NOTE — Progress Notes (Signed)
PT Cancellation Note  Patient Details Name: Domenique Cuervo MRN: [id number] DOB: 09-Apr-1955   Cancelled Treatment:    Reason Eval/Treat Not Completed: Medical issues which prohibited therapy.  BP very elevated and will check later as time and pt allow.   Ivar Drape 07/04/2016, 9:39 AM   Samul Dada, PT MS Acute Rehab Dept. Number: [facility] [id number] and [facility] [phone]

## 2016-07-04 NOTE — Progress Notes (Signed)
*  PRELIMINARY RESULTS* Echocardiogram 2D Echocardiogram has been performed.  Mitchell Dixon 07/04/2016, 8:11 AM

## 2016-07-05 DIAGNOSIS — I639 Cerebral infarction, unspecified: Secondary | ICD-10-CM

## 2016-07-05 LAB — CBC
HEMATOCRIT: 45 % (ref 40.0–52.0)
Hemoglobin: 15.4 g/dL (ref 13.0–18.0)
MCH: 33 pg (ref 26.0–34.0)
MCHC: 34.3 g/dL (ref 32.0–36.0)
MCV: 96.2 fL (ref 80.0–100.0)
PLATELETS: 248 10*3/uL (ref 150–440)
RBC: 4.68 MIL/uL (ref 4.40–5.90)
RDW: 14.1 % (ref 11.5–14.5)
WBC: 9 10*3/uL (ref 3.8–10.6)

## 2016-07-05 MED ORDER — ATORVASTATIN CALCIUM 40 MG PO TABS: 40.0000 mg | ORAL_TABLET | Freq: Every day | ORAL | 0 refills | Status: DC

## 2016-07-05 MED ORDER — LISINOPRIL 10 MG PO TABS: 10.0000 mg | ORAL_TABLET | Freq: Every day | ORAL | 0 refills | Status: DC

## 2016-07-05 MED ORDER — APIXABAN 5 MG PO TABS: 5.0000 mg | ORAL_TABLET | Freq: Two times a day (BID) | ORAL | Status: DC

## 2016-07-05 MED ORDER — APIXABAN 5 MG PO TABS: ORAL_TABLET | ORAL | 1 refills | Status: DC

## 2016-07-05 MED ORDER — APIXABAN 5 MG PO TABS
10.0000 mg | ORAL_TABLET | Freq: Two times a day (BID) | ORAL | Status: DC
  Administered 2016-07-05: 10 mg via ORAL
  Filled 2016-07-05: qty 2

## 2016-07-05 MED ORDER — METOPROLOL TARTRATE 25 MG PO TABS: 25.0000 mg | ORAL_TABLET | Freq: Two times a day (BID) | ORAL | 0 refills | Status: DC

## 2016-07-05 NOTE — Care Management (Signed)
Patient admitted with acute CVA.  No insurance.  Does odd jobs to get money.  Lives  in a mobile home.  Says "I am not disabled.  I just have not worked since 2012".  Denies issues with obtaining food, has a car and drives.  has not been to the doctor "in years."  Provided patient with [facility] application and given very firm instructions on the need to follow through with completion .  Made patient an appointment at the [facility] for 07/22/2016 at 8:40A in the event he decides he would rather be followed there.  Dr Judithann Sheen will sign home health orders and follow until 5/24- at which time patient should have kept appointment at the [facility] or have completed and been accepted by [facility].  Advanced evaluated for eligibility for charity care and have requested nurse, physical therapy and social work. [facility].  Patient is also going to discharge on new Eliquis.  Provided 30 day Eliquis coupon and it is very possible [facility] will have some on hand.  Updated attending that services have been finalized and patient to discharge.  Patient says his sister will transport him home

## 2016-07-05 NOTE — Progress Notes (Signed)
[facility] - [place] at [facility]   PATIENT NAME: Mitchell Dixon    MR#:  [id number]  DATE OF BIRTH:  [birth year]  SUBJECTIVE:   Came in with falls and right leg heaviness. Pt ambulated well this am with PT REVIEW OF SYSTEMS:   Review of Systems  Constitutional: Negative for chills, fever and weight loss.  HENT: Negative for ear discharge, ear pain and nosebleeds.   Eyes: Negative for blurred vision, pain and discharge.  Respiratory: Negative for sputum production, shortness of breath, wheezing and stridor.   Cardiovascular: Negative for chest pain, palpitations, orthopnea and PND.  Gastrointestinal: Negative for abdominal pain, diarrhea, nausea and vomiting.  Genitourinary: Negative for frequency and urgency.  Musculoskeletal: Negative for back pain and joint pain.  Neurological: Positive for weakness. Negative for sensory change, speech change and focal weakness.  Psychiatric/Behavioral: Negative for depression and hallucinations. The patient is not nervous/anxious.    Tolerating Diet:yes Tolerating PT: HHPT  DRUG ALLERGIES:  No Known Allergies  VITALS:  Blood pressure 132/88, pulse 65, temperature 98 F (36.7 C), temperature source Oral, resp. rate 17, height 5\' 10"  (1.778 m), weight 76.7 kg (169 lb), SpO2 95 %.  PHYSICAL EXAMINATION:   Physical Exam  GENERAL:  61 y.o.-year-old patient lying in the bed with no acute distress.  EYES: Pupils equal, round, reactive to light and accommodation. No scleral icterus. Extraocular muscles intact.  HEENT: Head atraumatic, normocephalic. Oropharynx and nasopharynx clear.  NECK:  Supple, no jugular venous distention. No thyroid enlargement, no tenderness.  LUNGS: Normal breath sounds bilaterally, no wheezing, rales, rhonchi. No use of accessory muscles of respiration.  CARDIOVASCULAR: S1, S2 normal. No murmurs, rubs, or gallops.  ABDOMEN: Soft, nontender, nondistended. Bowel sounds present. No  organomegaly or mass.  EXTREMITIES: No cyanosis, clubbing or edema b/l.    NEUROLOGIC: Cranial nerves II through XII are intact. No focal Motor or sensory deficits b/l.   PSYCHIATRIC:  patient is alert and oriented x 3.  SKIN: No obvious rash, lesion, or ulcer.   LABORATORY PANEL:  CBC  Recent Labs Lab 07/03/16 0554  WBC 9.9  HGB 15.0  HCT 44.2  PLT 252    Chemistries   Recent Labs Lab 07/03/16 0554 07/04/16 1019  NA 138  --   K 3.1* 3.9  CL 103  --   CO2 26  --   GLUCOSE 103*  --   BUN 11  --   CREATININE 1.00  --   CALCIUM 8.8*  --   MG 2.0  --    Cardiac Enzymes  Recent Labs Lab 07/03/16 0554  TROPONINI <0.03   RADIOLOGY:  Ct Angio Head W Or Wo Contrast  Result Date: 07/03/2016 CLINICAL DATA:  CVA. EXAM: CT ANGIOGRAPHY HEAD AND NECK TECHNIQUE: Multidetector CT imaging of the head and neck was performed using the standard protocol during bolus administration of intravenous contrast. Multiplanar CT image reconstructions and MIPs were obtained to evaluate the vascular anatomy. Carotid stenosis measurements (when applicable) are obtained utilizing NASCET criteria, using the distal internal carotid diameter as the denominator. CONTRAST:  75 cc Isovue 370 intravenous COMPARISON:  Head CT and brain MRI earlier today FINDINGS: CTA NECK FINDINGS Aortic arch: Widening of the arch due to ductus bump. Three vessel branching but arch origin of the left vertebral artery and common origin of the brachiocephalic and left common carotid. No acute finding. Subtle ridge like structure along the left subclavian artery of indeterminate significance. More distal mild wasting is  at the first rib crossing. Right carotid system: Smooth and widely patent. No definitive atherosclerosis. Left carotid system: Widely patent. Minimal undulation of the mid ICA is attributed to curvature rather than beading. No definitive atherosclerosis. Vertebral arteries: Right dominant vertebral artery. No  brachiocephalic or right subclavian stenosis. The small left vertebral artery ends in PICA and originates from the arch. Both vessels are smooth and widely patent. Skeleton: No acute or aggressive finding Other neck: No evidence of mass or inflammation. Upper chest: Centrilobular emphysema. Review of the MIP images confirms the above findings CTA HEAD FINDINGS Anterior circulation: No noted atherosclerosis, stenosis, or beading. Hypoplastic right A1 segment with large right posterior communicating artery. Negative for aneurysm Posterior circulation: Strongly dominant right vertebral artery. Left vertebral artery ends in PICA. Basilar and vertebral arteries are smooth and widely patent. Large posterior communicating artery on the right. No branch occlusion or flow limiting stenosis. Negative for aneurysm. Venous sinuses: Patent Anatomic variants: Described above. Delayed phase: No abnormal intracranial enhancement. Review of the MIP images confirms the above findings IMPRESSION: No acute arterial finding.  No noted atherosclerosis or stenosis. Electronically Signed   By: Marnee Spring M.D.   On: 07/03/2016 19:34   Ct Angio Neck W Or Wo Contrast  Result Date: 07/03/2016 CLINICAL DATA:  CVA. EXAM: CT ANGIOGRAPHY HEAD AND NECK TECHNIQUE: Multidetector CT imaging of the head and neck was performed using the standard protocol during bolus administration of intravenous contrast. Multiplanar CT image reconstructions and MIPs were obtained to evaluate the vascular anatomy. Carotid stenosis measurements (when applicable) are obtained utilizing NASCET criteria, using the distal internal carotid diameter as the denominator. CONTRAST:  75 cc Isovue 370 intravenous COMPARISON:  Head CT and brain MRI earlier today FINDINGS: CTA NECK FINDINGS Aortic arch: Widening of the arch due to ductus bump. Three vessel branching but arch origin of the left vertebral artery and common origin of the brachiocephalic and left common carotid.  No acute finding. Subtle ridge like structure along the left subclavian artery of indeterminate significance. More distal mild wasting is at the first rib crossing. Right carotid system: Smooth and widely patent. No definitive atherosclerosis. Left carotid system: Widely patent. Minimal undulation of the mid ICA is attributed to curvature rather than beading. No definitive atherosclerosis. Vertebral arteries: Right dominant vertebral artery. No brachiocephalic or right subclavian stenosis. The small left vertebral artery ends in PICA and originates from the arch. Both vessels are smooth and widely patent. Skeleton: No acute or aggressive finding Other neck: No evidence of mass or inflammation. Upper chest: Centrilobular emphysema. Review of the MIP images confirms the above findings CTA HEAD FINDINGS Anterior circulation: No noted atherosclerosis, stenosis, or beading. Hypoplastic right A1 segment with large right posterior communicating artery. Negative for aneurysm Posterior circulation: Strongly dominant right vertebral artery. Left vertebral artery ends in PICA. Basilar and vertebral arteries are smooth and widely patent. Large posterior communicating artery on the right. No branch occlusion or flow limiting stenosis. Negative for aneurysm. Venous sinuses: Patent Anatomic variants: Described above. Delayed phase: No abnormal intracranial enhancement. Review of the MIP images confirms the above findings IMPRESSION: No acute arterial finding.  No noted atherosclerosis or stenosis. Electronically Signed   By: Marnee Spring M.D.   On: 07/03/2016 19:34   Mr Laqueta Jean And Wo Contrast  Result Date: 07/03/2016 CLINICAL DATA:  Hypertension and abnormal head CT. EXAM: MRI HEAD WITHOUT AND WITH CONTRAST TECHNIQUE: Multiplanar, multiecho pulse sequences of the brain and surrounding structures were obtained without  and with intravenous contrast. CONTRAST:  15mL MULTIHANCE GADOBENATE DIMEGLUMINE 529 MG/ML IV SOLN  COMPARISON:  Head CT from earlier today FINDINGS: Brain: 11 mm focus of restricted diffusion in the left corona radiata and caudate body. Remote lacunar infarcts in the left caudate head and right internal capsule. There is patchy age advanced FLAIR hyperintensity in the cerebral white matter. There is nonspecific location, although it is notable anterior temporal lobe involvement. No infratentorial signal abnormality or predominant ovoid radiating periventricular involvement. No hemorrhage, hydrocephalus, or swelling. No abnormal intracranial enhancement Vascular: Major flow voids are preserved Skull and upper cervical spine: Negative Sinuses/Orbits: Negative IMPRESSION: 1. 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 2. Moderate for age white matter disease that is nonspecific. Patient's vascular risk factors and remote lacunar infarcts implies chronic small vessel ischemia. 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 Pattern would be atypical for multiple sclerosis. Electronically Signed   By: Marnee Spring M.D.   On: 07/03/2016 14:12   ASSESSMENT AND PLAN:  61 year old male with past medical history of hypertension who presents to the hospital due to recurrent falls and noted to have accelerated hypertension with an acute CVA.  1. Acute CVA-patient has a acute CVA on MRI of the brain and the left corona radiata and caudate body. This correlates with patient's right leg weakness and recurrent falls. -cont aspirin, high-dose intensity statin. - CTA head and neck negative -appreciate neurology consult. -  two-dimensional echocardiogram showed LV thrombus per Dr Milta Deiters. Will start on po oral anticoagulation for now. This was d/w dr Thad Ranger and she is agreeable. -CM for d/c planning.  2. Hypertensive urgency - On Oral Metoprolol, Lisinopril.  - PRN Hydralazine.  Goal SBP 170 and diastolic BP in the 90's.   3. Hypokalemia - will place on Oral  potassium supplements.   D/c once  CM makes arrangement for medication help  Case discussed with Care Management/Social Worker. Management plans discussed with the patient, family and they are in agreement.  CODE STATUS: full  DVT Prophylaxis: lovenox  TOTAL TIME TAKING CARE OF THIS PATIENT: *30* minutes.  >50% time spent on counselling and coordination of care  Pt is ready for discharge.  Note: This dictation was prepared with Dragon dictation along with smaller phrase technology. Any transcriptional errors that result from this process are unintentional.  Roosevelt Eimers M.D on 07/05/2016 at 12:32 PM  Between 7am to 6pm - Pager - [phone]  After 6pm go to [email or web address] - password [email or web address]  [facility]  Office  [phone]  CC: Primary care physician; Patient, No Pcp Per

## 2016-07-05 NOTE — Evaluation (Signed)
Physical Therapy Evaluation Patient Details Name: Mitchell Dixon MRN: [id number] DOB: 1955-08-20 Today's Date: 07/05/2016   History of Present Illness  Pt is a 61 y.o. 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 Pt came to ED reporting multiple falls in 2-3 days prior d/t his legs giving out and R knee pain from 2 falls landing on his R knee (x-ray negative). Pt has PMH of essential hypertension.  Clinical Impression  Pt was in bed watching TV upon entry and was agreeable to PT evaluation. Pt presents with mild R sided LE weakness, mild R LE sensation deficits, mild R UE fine motor coordination deficits, and mild R sided peripheral vision loss. Pt lives alone in a one story mobile home with 5 steps to enter with no rails. His two brothers will be available to assist PRN upon d/c. Pt usually drives himself and does not presently have anyone who can drive him upon d/c. Pt was independent with bed mobility and CGA for transfers and ambulation with and w/o RW (see gait deficits below) w/o LOB. PT will attempt stairs, continue exercise program, and perform gait training to address deficits during next session. Recommend HHPT upon d/c to address deficits mentioned above and d/t pt not having someone available to drive him to appointments.    Follow Up Recommendations Home health PT    Equipment Recommendations       Recommendations for Other Services OT consult     Precautions / Restrictions Precautions Precautions: Fall Restrictions Weight Bearing Restrictions: No      Mobility  Bed Mobility Overal bed mobility: Independent                Transfers Overall transfer level: Needs assistance Equipment used: Rolling walker (2 wheeled) Transfers: Sit to/from Stand Sit to Stand: Modified independent (Device/Increase time);Min guard         General transfer comment: Pt performed sit to stand with  RW for safety, but did not use RW for stand to sit with CGA for safety  Ambulation/Gait Ambulation/Gait assistance: Min guard   Assistive device: Rolling walker (2 wheeled);None Gait Pattern/deviations: Decreased stance time - right;Scissoring;Narrow base of support Gait velocity: Pt reprots he walks a little faster at home, but not too much Gait velocity interpretation: Below normal speed for age/gender General Gait Details: Pt ambulated 100 ft with RW steady on his feet with light touch on RW, then pt ambulated 160 ft w/o RW with gait deviations noted above; v/c's provided to perform quad set prior to steping on R LE d/t slight bend in R knee with stepping (pt able to follow cues and did not require any further cueing for this during ambulation)  Stairs            Wheelchair Mobility    Modified Rankin (Stroke Patients Only)       Balance Overall balance assessment: Needs assistance Sitting-balance support: No upper extremity supported;Feet supported Sitting balance-Leahy Scale: Normal     Standing balance support: No upper extremity supported Standing balance-Leahy Scale: Good               High level balance activites: Head turns High Level Balance Comments: Pt performed head turns up/down/L/R during ambulation w/o RW w/o changes to gait or LOB             Pertinent Vitals/Pain Pain Assessment: No/denies pain    Home Living Family/patient expects to be  discharged to:: Private residence Living Arrangements: Alone Available Help at Discharge: Family;Available PRN/intermittently Type of Home: Mobile home Home Access: Stairs to enter Entrance Stairs-Rails: None Entrance Stairs-Number of Steps: 5 Home Layout: One level Home Equipment: Cane - single point Additional Comments: Pt reports his 2 brothers will be able to check on him at home and his brother has a SPC that he could borrow.    Prior Function Level of Independence: Independent                Hand Dominance   Dominant Hand: Left    Extremity/Trunk Assessment   Upper Extremity Assessment Upper Extremity Assessment: Overall WFL for tasks assessed;RUE deficits/detail (B UE 5/5 strength for shoulder flexion and elbow flexion extension; good grip strength; sensation intact; ) RUE Deficits / Details: increased time to perform finger to thumb opposition test on R compared to L; normal finger to nose test     Lower Extremity Assessment Lower Extremity Assessment: RLE deficits/detail;LLE deficits/detail RLE Deficits / Details: Strength 5/5 ankle DF/PF in supine, 4/5 hip flexion and 4+/5 for knee flexion/extension in sitting; decreased sensation for dermatomes L1, L3, and S1 75% compared to L; tone WNL; normal heel to shin test; proprioception intact LLE Deficits / Details: Strength 5/5 ankle DF/PF in supine, 5/5 hip flexion and 5/5 knee flexion/extension in sitting; sensation intact; tone WNL; normal heel to shin test; proprioception intact       Communication   Communication: No difficulties  Cognition Arousal/Alertness: Awake/alert Behavior During Therapy: WFL for tasks assessed/performed Overall Cognitive Status: Within Functional Limits for tasks assessed                                        General Comments  Pt reported 7 falls in the 2-3 days prior to admission, but no other falls in the past year. Pt's mild R sided peripheral vision deficit did not appear to impair pt's function or mobility during the evaluation.    Exercises General Exercises - Lower Extremity Long Arc Quad: AROM;Strengthening;Right;10 reps;Seated (with manual resistance) Hip Flexion/Marching: AROM;Strengthening;Both;10 reps;Seated   Assessment/Plan    PT Assessment Patient needs continued PT services  PT Problem List Decreased strength;Decreased coordination;Decreased balance;Decreased knowledge of use of DME       PT Treatment Interventions DME instruction;Gait  training;Therapeutic activities;Therapeutic exercise;Patient/family education;Stair training;Balance training;Functional mobility training;Neuromuscular re-education    PT Goals (Current goals can be found in the Care Plan section)  Acute Rehab PT Goals Patient Stated Goal: to go home PT Goal Formulation: With patient Time For Goal Achievement: 07/19/16 Potential to Achieve Goals: Good    Frequency 7X/week   Barriers to discharge        Co-evaluation               AM-PAC PT "6 Clicks" Daily Activity  Outcome Measure Difficulty turning over in bed (including adjusting bedclothes, sheets and blankets)?: None Difficulty moving from lying on back to sitting on the side of the bed? : None Difficulty sitting down on and standing up from a chair with arms (e.g., wheelchair, bedside commode, etc,.)?: None Help needed moving to and from a bed to chair (including a wheelchair)?: A Little Help needed walking in hospital room?: A Little Help needed climbing 3-5 steps with a railing? : A Little 6 Click Score: 21    End of Session Equipment Utilized During Treatment: Gait belt Activity  Tolerance: Patient tolerated treatment well;No increased pain Patient left: in chair;with call bell/phone within reach;with chair alarm set Nurse Communication: Mobility status;Precautions PT Visit Diagnosis: Muscle weakness (generalized) (M62.81);Other symptoms and signs involving the nervous system (R29.898);Other abnormalities of gait and mobility (R26.89)    Time: 1018-1050 PT Time Calculation (min) (ACUTE ONLY): 32 min   Charges:         PT G Codes:         Ambra Haverstick, SPT 07/05/2016, 1:09 PM

## 2016-07-05 NOTE — Discharge Summary (Signed)
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 Essential hypertension     HOSPITAL COURSE:  61 year old male with past medical history of hypertension who presents to the hospital due to recurrent falls and noted to have accelerated hypertension with an acute CVA.  1. Acute CVA-patient has a acute CVA on MRI of the brain and the left corona radiata and caudate body. This correlates with patient's right leg weakness and recurrent falls. -cont aspirin, high-dose intensity statin. - CTA head and neck negative -appreciateneurology consult. -  two-dimensional echocardiogram showed LV thrombus per Dr Milta Deiters. Will start on po oral anticoagulation for now. This was d/w dr Thad Ranger and she is agreeable. -CM for d/c planning-patient has been given 30 day supply of eliquis via CM  2. Hypertensive urgency - On Oral Metoprolol, Lisinopril.  - PRN Hydralazine. Goal SBP 170 and diastolic BP in the 90's.   3. Hypokalemia - will place on Oral potassium supplements  CM has made arrangements for patient to be [facility] up [facility] and also made arrangements for patient to pick up medications from medication management clinic.  d/c h ome  CONSULTS OBTAINED:   Treatment Team:  Pauletta Browns, MD  DRUG ALLERGIES:  No Known Allergies  DISCHARGE MEDICATIONS:   Current Discharge Medication List    START taking these medications   Details  apixaban (ELIQUIS) 5 MG TABS tablet Take 10 mg (2 tabs) bid till 07/11/16 and then take 1 tab (5 mg ) bid from 07/12/16 Qty: 60 tablet, Refills: 1    atorvastatin (LIPITOR) 40 MG tablet Take 1 tablet (40 mg total) by mouth daily at 6 PM. Qty: 30 tablet, Refills: 0    hydrochlorothiazide (HYDRODIURIL) 25 MG tablet Take 1 tablet (25 mg total) by mouth daily. Qty: 20 tablet, Refills: 0    lisinopril (PRINIVIL,ZESTRIL) 10 MG tablet Take 1 tablet (10 mg total) by mouth daily. Qty: 30 tablet, Refills: 0    metoprolol tartrate (LOPRESSOR) 25 MG tablet Take 1 tablet (25 mg total) by mouth 2 (two) times daily. Qty: 60 tablet, Refills: 0        If you experience worsening of your admission symptoms, develop shortness of breath, life threatening emergency, suicidal or homicidal thoughts you must seek medical attention immediately by calling 911 or calling your MD immediately  if symptoms less severe.  You Must read complete instructions/literature along with all the possible adverse reactions/side effects for all the Medicines you take and that have been prescribed to you. Take any new Medicines after you have completely understood and accept all the possible adverse reactions/side effects.   Please note  You were cared for by a hospitalist during your hospital stay. If you have any questions about your discharge medications or the care you received while you were in the hospital after you are discharged,  you can call the unit and asked to speak with the hospitalist on call if the hospitalist that took care of you is not available. Once you are discharged, your primary care physician will handle any further medical issues. Please note that NO REFILLS for any discharge medications will be authorized once you are  discharged, as it is imperative that you return to your primary care physician (or establish a relationship with a primary care physician if you do not have one) for your aftercare needs so that they can reassess your need for medications and monitor your lab values.  DATA REVIEW:   CBC   Recent Labs Lab 07/05/16 1317  WBC 9.0  HGB 15.4  HCT 45.0  PLT 248    Chemistries   Recent Labs Lab 07/03/16 0554 07/04/16 1019  NA 138  --   K 3.1* 3.9  CL 103  --   CO2 26  --   GLUCOSE 103*  --   BUN 11  --   CREATININE 1.00  --   CALCIUM 8.8*  --   MG 2.0  --     Microbiology Results   No results found for this or any previous visit (from the past 240 hour(s)).  RADIOLOGY:  Ct Angio Head W Or Wo Contrast  Result Date: 07/03/2016 CLINICAL DATA:  CVA. EXAM: CT ANGIOGRAPHY HEAD AND NECK TECHNIQUE: Multidetector CT imaging of the head and neck was performed using the standard protocol during bolus administration of intravenous contrast. Multiplanar CT image reconstructions and MIPs were obtained to evaluate the vascular anatomy. Carotid stenosis measurements (when applicable) are obtained utilizing NASCET criteria, using the distal internal carotid diameter as the denominator. CONTRAST:  75 cc Isovue 370 intravenous COMPARISON:  Head CT and brain MRI earlier today FINDINGS: CTA NECK FINDINGS Aortic arch: Widening of the arch due to ductus bump. Three vessel branching but arch origin of the left vertebral artery and common origin of the brachiocephalic and left common carotid. No acute finding. Subtle ridge like structure along the left subclavian artery of indeterminate significance. More distal mild wasting is at the first rib crossing. Right carotid system: Smooth and widely patent. No definitive atherosclerosis. Left carotid system: Widely patent. Minimal undulation of the mid ICA is attributed to curvature rather than beading. No definitive atherosclerosis. Vertebral arteries: Right  dominant vertebral artery. No brachiocephalic or right subclavian stenosis. The small left vertebral artery ends in PICA and originates from the arch. Both vessels are smooth and widely patent. Skeleton: No acute or aggressive finding Other neck: No evidence of mass or inflammation. Upper chest: Centrilobular emphysema. Review of the MIP images confirms the above findings CTA HEAD FINDINGS Anterior circulation: No noted atherosclerosis, stenosis, or beading. Hypoplastic right A1 segment with large right posterior communicating artery. Negative for aneurysm Posterior circulation: Strongly dominant right vertebral artery. Left vertebral artery ends in PICA. Basilar and vertebral arteries are smooth and widely patent. Large posterior communicating artery on the right. No branch occlusion or flow limiting stenosis. Negative for aneurysm. Venous sinuses: Patent Anatomic variants: Described above. Delayed phase: No abnormal intracranial enhancement. Review of the MIP images confirms the above findings IMPRESSION: No acute arterial finding.  No noted atherosclerosis or stenosis. Electronically Signed   By: Marnee Spring M.D.   On: 07/03/2016 19:34   Ct Angio Neck W Or Wo Contrast  Result Date: 07/03/2016 CLINICAL DATA:  CVA. EXAM: CT ANGIOGRAPHY HEAD AND NECK TECHNIQUE: Multidetector CT imaging of the head and neck was performed  using the standard protocol during bolus administration of intravenous contrast. Multiplanar CT image reconstructions and MIPs were obtained to evaluate the vascular anatomy. Carotid stenosis measurements (when applicable) are obtained utilizing NASCET criteria, using the distal internal carotid diameter as the denominator. CONTRAST:  75 cc Isovue 370 intravenous COMPARISON:  Head CT and brain MRI earlier today FINDINGS: CTA NECK FINDINGS Aortic arch: Widening of the arch due to ductus bump. Three vessel branching but arch origin of the left vertebral artery and common origin of the  brachiocephalic and left common carotid. No acute finding. Subtle ridge like structure along the left subclavian artery of indeterminate significance. More distal mild wasting is at the first rib crossing. Right carotid system: Smooth and widely patent. No definitive atherosclerosis. Left carotid system: Widely patent. Minimal undulation of the mid ICA is attributed to curvature rather than beading. No definitive atherosclerosis. Vertebral arteries: Right dominant vertebral artery. No brachiocephalic or right subclavian stenosis. The small left vertebral artery ends in PICA and originates from the arch. Both vessels are smooth and widely patent. Skeleton: No acute or aggressive finding Other neck: No evidence of mass or inflammation. Upper chest: Centrilobular emphysema. Review of the MIP images confirms the above findings CTA HEAD FINDINGS Anterior circulation: No noted atherosclerosis, stenosis, or beading. Hypoplastic right A1 segment with large right posterior communicating artery. Negative for aneurysm Posterior circulation: Strongly dominant right vertebral artery. Left vertebral artery ends in PICA. Basilar and vertebral arteries are smooth and widely patent. Large posterior communicating artery on the right. No branch occlusion or flow limiting stenosis. Negative for aneurysm. Venous sinuses: Patent Anatomic variants: Described above. Delayed phase: No abnormal intracranial enhancement. Review of the MIP images confirms the above findings IMPRESSION: No acute arterial finding.  No noted atherosclerosis or stenosis. Electronically Signed   By: Marnee Spring M.D.   On: 07/03/2016 19:34     Management plans discussed with the patient, family and they are in agreement.  CODE STATUS:     Code Status Orders        Start     Ordered   07/03/16 1558  Full code  Continuous     07/03/16 1557    Code Status History    Date Active Date Inactive Code Status Order ID Comments User Context   This  patient has a current code status but no historical code status.      TOTAL TIME TAKING CARE OF THIS PATIENT: 40  minutes.    Oris Calmes M.D on 07/05/2016 at 3:21 PM  Between 7am to 6pm - Pager - [phone] After 6pm go to [email or web address] - password [email or web address]  [facility]  Office  [phone]  CC: Primary care physician; Patient, No Pcp Per

## 2016-07-05 NOTE — Progress Notes (Signed)
Subjective: Patient awake and alert.  No new neurological complaints.  Reports still having some difficulty walking.    Objective: Current vital signs: BP 132/88 (BP Location: Left Arm)   Pulse 65   Temp 98 F (36.7 C) (Oral)   Resp 17   Ht 5\' 10"  (1.778 m)   Wt 76.7 kg (169 lb)   SpO2 95%   BMI 24.25 kg/m  Vital signs in last 24 hours: Temp:  [97.9 F (36.6 C)-98.4 F (36.9 C)] 98 F (36.7 C) (05/07 0737) Pulse Rate:  [65-75] 65 (05/07 0737) Resp:  [16-17] 17 (05/07 0737) BP: (108-172)/(81-106) 132/88 (05/07 0737) SpO2:  [95 %-97 %] 95 % (05/07 0737)  Intake/Output from previous day: 05/06 0701 - 05/07 0700 In: 480 [P.O.:480] Out: 950 [Urine:950] Intake/Output this shift: Total I/O In: 240 [P.O.:240] Out: -  Nutritional status: Diet Heart Room service appropriate? Yes; Fluid consistency: Thin  Neurologic Exam: Mental Status: Alert, oriented, thought content appropriate.  Speech fluent without evidence of aphasia.  Able to follow 3 step commands without difficulty. Cranial Nerves: II: Discs flat bilaterally; Visual fields grossly normal, pupils equal, round, reactive to light and accommodation III,IV, VI: ptosis not present, extra-ocular motions intact bilaterally V,VII: smile symmetric, facial light touch sensation normal bilaterally VIII: hearing normal bilaterally IX,X: gag reflex present XI: bilateral shoulder shrug XII: midline tongue extension Motor: Right :  Upper extremity   5/5                                      Left:     Upper extremity   5/5             Lower extremity   4+/5                                                Lower extremity   5/5 Tone and bulk:normal tone throughout; no atrophy noted Sensory: Pinprick and light touch intact throughout, bilaterally Deep Tendon Reflexes: 1+ and symmetric throughout Plantars: Right: downgoing                                Left: downgoing Cerebellar: Normal finger-to-nose.  Dysmetria on right heel-to-shin  testing   Lab Results: Basic Metabolic Panel:  Recent Labs Lab 07/03/16 0554 07/04/16 1019  NA 138  --   K 3.1* 3.9  CL 103  --   CO2 26  --   GLUCOSE 103*  --   BUN 11  --   CREATININE 1.00  --   CALCIUM 8.8*  --   MG 2.0  --     Liver Function Tests: No results for input(s): AST, ALT, ALKPHOS, BILITOT, PROT, ALBUMIN in the last 168 hours. No results for input(s): LIPASE, AMYLASE in the last 168 hours. No results for input(s): AMMONIA in the last 168 hours.  CBC:  Recent Labs Lab 07/03/16 0554  WBC 9.9  HGB 15.0  HCT 44.2  MCV 95.7  PLT 252    Cardiac Enzymes:  Recent Labs Lab 07/03/16 0554  TROPONINI <0.03    Lipid Panel: No results for input(s): CHOL, TRIG, HDL, CHOLHDL, VLDL, LDLCALC in the last 168 hours.  CBG: No results for input(s):  GLUCAP in the last 168 hours.  Microbiology: No results found for this or any previous visit.  Coagulation Studies: No results for input(s): LABPROT, INR in the last 72 hours.  Imaging: Ct Angio Head W Or Wo Contrast  Result Date: 07/03/2016 CLINICAL DATA:  CVA. EXAM: CT ANGIOGRAPHY HEAD AND NECK TECHNIQUE: Multidetector CT imaging of the head and neck was performed using the standard protocol during bolus administration of intravenous contrast. Multiplanar CT image reconstructions and MIPs were obtained to evaluate the vascular anatomy. Carotid stenosis measurements (when applicable) are obtained utilizing NASCET criteria, using the distal internal carotid diameter as the denominator. CONTRAST:  75 cc Isovue 370 intravenous COMPARISON:  Head CT and brain MRI earlier today FINDINGS: CTA NECK FINDINGS Aortic arch: Widening of the arch due to ductus bump. Three vessel branching but arch origin of the left vertebral artery and common origin of the brachiocephalic and left common carotid. No acute finding. Subtle ridge like structure along the left subclavian artery of indeterminate significance. More distal mild wasting is  at the first rib crossing. Right carotid system: Smooth and widely patent. No definitive atherosclerosis. Left carotid system: Widely patent. Minimal undulation of the mid ICA is attributed to curvature rather than beading. No definitive atherosclerosis. Vertebral arteries: Right dominant vertebral artery. No brachiocephalic or right subclavian stenosis. The small left vertebral artery ends in PICA and originates from the arch. Both vessels are smooth and widely patent. Skeleton: No acute or aggressive finding Other neck: No evidence of mass or inflammation. Upper chest: Centrilobular emphysema. Review of the MIP images confirms the above findings CTA HEAD FINDINGS Anterior circulation: No noted atherosclerosis, stenosis, or beading. Hypoplastic right A1 segment with large right posterior communicating artery. Negative for aneurysm Posterior circulation: Strongly dominant right vertebral artery. Left vertebral artery ends in PICA. Basilar and vertebral arteries are smooth and widely patent. Large posterior communicating artery on the right. No branch occlusion or flow limiting stenosis. Negative for aneurysm. Venous sinuses: Patent Anatomic variants: Described above. Delayed phase: No abnormal intracranial enhancement. Review of the MIP images confirms the above findings IMPRESSION: No acute arterial finding.  No noted atherosclerosis or stenosis. Electronically Signed   By: Marnee Spring M.D.   On: 07/03/2016 19:34   Ct Angio Neck W Or Wo Contrast  Result Date: 07/03/2016 CLINICAL DATA:  CVA. EXAM: CT ANGIOGRAPHY HEAD AND NECK TECHNIQUE: Multidetector CT imaging of the head and neck was performed using the standard protocol during bolus administration of intravenous contrast. Multiplanar CT image reconstructions and MIPs were obtained to evaluate the vascular anatomy. Carotid stenosis measurements (when applicable) are obtained utilizing NASCET criteria, using the distal internal carotid diameter as the  denominator. CONTRAST:  75 cc Isovue 370 intravenous COMPARISON:  Head CT and brain MRI earlier today FINDINGS: CTA NECK FINDINGS Aortic arch: Widening of the arch due to ductus bump. Three vessel branching but arch origin of the left vertebral artery and common origin of the brachiocephalic and left common carotid. No acute finding. Subtle ridge like structure along the left subclavian artery of indeterminate significance. More distal mild wasting is at the first rib crossing. Right carotid system: Smooth and widely patent. No definitive atherosclerosis. Left carotid system: Widely patent. Minimal undulation of the mid ICA is attributed to curvature rather than beading. No definitive atherosclerosis. Vertebral arteries: Right dominant vertebral artery. No brachiocephalic or right subclavian stenosis. The small left vertebral artery ends in PICA and originates from the arch. Both vessels are smooth and widely patent.  Skeleton: No acute or aggressive finding Other neck: No evidence of mass or inflammation. Upper chest: Centrilobular emphysema. Review of the MIP images confirms the above findings CTA HEAD FINDINGS Anterior circulation: No noted atherosclerosis, stenosis, or beading. Hypoplastic right A1 segment with large right posterior communicating artery. Negative for aneurysm Posterior circulation: Strongly dominant right vertebral artery. Left vertebral artery ends in PICA. Basilar and vertebral arteries are smooth and widely patent. Large posterior communicating artery on the right. No branch occlusion or flow limiting stenosis. Negative for aneurysm. Venous sinuses: Patent Anatomic variants: Described above. Delayed phase: No abnormal intracranial enhancement. Review of the MIP images confirms the above findings IMPRESSION: No acute arterial finding.  No noted atherosclerosis or stenosis. Electronically Signed   By: Marnee Spring M.D.   On: 07/03/2016 19:34   Mr Laqueta Jean And Wo Contrast  Result Date:  07/03/2016 CLINICAL DATA:  Hypertension and abnormal head CT. EXAM: MRI HEAD WITHOUT AND WITH CONTRAST TECHNIQUE: Multiplanar, multiecho pulse sequences of the brain and surrounding structures were obtained without and with intravenous contrast. CONTRAST:  62mL MULTIHANCE GADOBENATE DIMEGLUMINE 529 MG/ML IV SOLN COMPARISON:  Head CT from earlier today FINDINGS: Brain: 11 mm focus of restricted diffusion in the left corona radiata and caudate body. Remote lacunar infarcts in the left caudate head and right internal capsule. There is patchy age advanced FLAIR hyperintensity in the cerebral white matter. There is nonspecific location, although it is notable anterior temporal lobe involvement. No infratentorial signal abnormality or predominant ovoid radiating periventricular involvement. No hemorrhage, hydrocephalus, or swelling. No abnormal intracranial enhancement Vascular: Major flow voids are preserved Skull and upper cervical spine: Negative Sinuses/Orbits: Negative IMPRESSION: 1. 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 2. Moderate for age white matter disease that is nonspecific. Patient's vascular risk factors and remote lacunar infarcts implies chronic small vessel ischemia. 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 Pattern would be atypical for multiple sclerosis. Electronically Signed   By: Marnee Spring M.D.   On: 07/03/2016 14:12    Medications:  I have reviewed the patient's current medications. Scheduled: . aspirin EC  81 mg Oral Daily  . atorvastatin  40 mg Oral q1800  . enoxaparin (LOVENOX) injection  40 mg Subcutaneous Q24H  . lisinopril  10 mg Oral Daily  . metoprolol tartrate  25 mg Oral BID  . sodium chloride flush  3 mL Intravenous Q12H    Assessment/Plan: Patient with continued RLE weakness.  Echocardiogram shows LV thrombus.  High risk for further ischemic events.    Recommendations: 1.  Anticoagulation to be started.   Patient made aware of risks and benefits. 2.  Frequent neuro checks 3.  BP control with SBP to be maintained <180 to help reduce the risk of hemorrhage.  .       LOS: 2 days   Thana Farr, MD Neurology [phone] 07/05/2016  12:31 PM

## 2016-07-05 NOTE — Progress Notes (Signed)
ANTICOAGULATION CONSULT NOTE - Initial Consult  Pharmacy Consult for Apixaban   Indication: LV Thrombus   No Known Allergies  Patient Measurements: Height: 5\' 10"  (177.8 cm) Weight: 169 lb (76.7 kg) IBW/kg (Calculated) : 73   Vital Signs: Temp: 98 F (36.7 C) (05/07 0737) Temp Source: Oral (05/07 0737) BP: 132/88 (05/07 0737) Pulse Rate: 65 (05/07 0737)  Labs:  Recent Labs  07/03/16 0554  HGB 15.0  HCT 44.2  PLT 252  CREATININE 1.00  TROPONINI <0.03    Estimated Creatinine Clearance: 83.1 mL/min (by C-G formula based on SCr of 1 mg/dL).   Medical History: Past Medical History:  Diagnosis Date  . Essential hypertension     Assessment: 61 yo male with Acute CVA, found to have LV thrombus on echo. Pharmacy consulted for Eliquis (Apixaban) dosing.    Goal of Therapy:  Monitor platelets by anticoagulation protocol: Yes   Plan:  Will start patient on Eliquis 10mg  BID x 7 days, followed by Eliquis 5mg  BID thereafter. Will discontinue enoxaparin.   Pharmacy will counsel patient prior to discharged on new medication.   Gardner Candle, PharmD, BCPS Clinical Pharmacist 07/05/2016 12:46 PM

## 2016-07-05 NOTE — Progress Notes (Signed)
Discharge paperwork reviewed with patient. Information regarding follow-up appointments, medications and education for all newly prescribed meds was provided, all questions answered. Prescription meds were provided to patient prior to discharge. Peripheral IV removed, catheter intact. Heart monitor removed, returned to the nurse station. Transport requested via wheelchair to the lobby for discharge.

## 2016-07-20 ENCOUNTER — Ambulatory Visit: Payer: Self-pay | Admitting: Adult Health Nurse Practitioner

## 2016-07-20 VITALS — BP 165/108 | HR 84 | Wt 158.0 lb

## 2016-07-20 DIAGNOSIS — I10 Essential (primary) hypertension: Secondary | ICD-10-CM | POA: Insufficient documentation

## 2016-07-20 DIAGNOSIS — Z0000 Encounter for general adult medical examination without abnormal findings: Secondary | ICD-10-CM

## 2016-07-20 DIAGNOSIS — E785 Hyperlipidemia, unspecified: Secondary | ICD-10-CM | POA: Insufficient documentation

## 2016-07-20 MED ORDER — ATORVASTATIN CALCIUM 40 MG PO TABS: 40.0000 mg | ORAL_TABLET | Freq: Every day | ORAL | 3 refills | Status: DC

## 2016-07-20 MED ORDER — LISINOPRIL 20 MG PO TABS: 20.0000 mg | ORAL_TABLET | Freq: Every day | ORAL | 3 refills | Status: DC

## 2016-07-20 MED ORDER — METOPROLOL TARTRATE 25 MG PO TABS: 25.0000 mg | ORAL_TABLET | Freq: Two times a day (BID) | ORAL | 3 refills | Status: DC

## 2016-07-20 MED ORDER — POTASSIUM CHLORIDE ER 20 MEQ PO TBCR: 20.0000 meq | EXTENDED_RELEASE_TABLET | Freq: Every day | ORAL | 3 refills | Status: DC

## 2016-07-20 MED ORDER — HYDROCHLOROTHIAZIDE 25 MG PO TABS: 25.0000 mg | ORAL_TABLET | Freq: Every day | ORAL | 3 refills | Status: DC

## 2016-07-20 NOTE — Progress Notes (Signed)
  Patient: Mitchell Dixon Male    DOB: [birth year]   61 y.o.   MRN: [id number] Visit Date: 07/20/2016  Today's Provider: Jacelyn Pi, NP   Chief Complaint  Patient presents with  . Establish Care   Subjective:    HPI   Pt states that he is not taking HCTZ and does not know why.  K was 3.1 during hospitalization.   He reports taking the other medications as directed.  Pt states that his BP runs high.  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   Denies dizziness, headaches or slurred speech.  Pt states that his eyesight and balance were both affected by the CVA.  Denies hematuria, melena or bleeding gums.     Denied family hx of prostate CA.  No Known Allergies Previous Medications   APIXABAN (ELIQUIS) 5 MG TABS TABLET    Take 10 mg (2 tabs) bid till 07/11/16 and then take 1 tab (5 mg ) bid from 07/12/16   ATORVASTATIN (LIPITOR) 40 MG TABLET    Take 1 tablet (40 mg total) by mouth daily at 6 PM.   HYDROCHLOROTHIAZIDE (HYDRODIURIL) 25 MG TABLET    Take 1 tablet (25 mg total) by mouth daily.   LISINOPRIL (PRINIVIL,ZESTRIL) 10 MG TABLET    Take 1 tablet (10 mg total) by mouth daily.   METOPROLOL TARTRATE (LOPRESSOR) 25 MG TABLET    Take 1 tablet (25 mg total) by mouth 2 (two) times daily.    Review of Systems  All other systems reviewed and are negative.   Social History  Substance Use Topics  . Smoking status: Current Every Day Smoker    Packs/day: 1.00    Years: 30.00    Types: Cigarettes  . Smokeless tobacco: Never Used  . Alcohol use No   Objective:   BP (!) 165/108 (BP Location: Right Arm, Patient Position: Sitting, Cuff Size: Normal)   Pulse 84   Wt 158 lb (71.7 kg)   BMI 22.67 kg/m   Physical Exam  Constitutional: He is oriented to person, place, and time. He appears well-developed and well-nourished.  HENT:  Head: Normocephalic and atraumatic.  Eyes: Pupils are equal, round, and reactive to light.  Neck: Normal range of motion. Neck supple.   Cardiovascular: Normal rate, regular rhythm, normal heart sounds and intact distal pulses.   Pulmonary/Chest: Effort normal and breath sounds normal.  Abdominal: Soft. Bowel sounds are normal.  Neurological: He is alert and oriented to person, place, and time.  Skin: Skin is warm and dry.  Psychiatric: He has a normal mood and affect.  Vitals reviewed.       Assessment & Plan:        Keep appointment with MM on 6.15  HTN:   Not controlled.  Goal BP <140/80.  Continue metoprolol, increase lisinopril to 20mg  daily and restart HCTZ 25 daily.  Add KCL supplement daily.  FU in 4 weeks for BP check and repeat CMET.  Encourage low salt diet and exercise.   Baseline labs ordered.     Jacelyn Pi, NP   [facility]

## 2016-07-21 LAB — LIPID PANEL
CHOLESTEROL TOTAL: 114 mg/dL (ref 100–199)
Chol/HDL Ratio: 4.1 ratio (ref 0.0–5.0)
HDL: 28 mg/dL — AB (ref >39)
LDL Calculated: 67 mg/dL (ref 0–99)
Triglycerides: 95 mg/dL (ref 0–149)
VLDL CHOLESTEROL CAL: 19 mg/dL (ref 5–40)

## 2016-07-21 LAB — COMPREHENSIVE METABOLIC PANEL
ALK PHOS: 94 IU/L (ref 39–117)
ALT: 19 IU/L (ref 0–44)
AST: 18 IU/L (ref 0–40)
Albumin/Globulin Ratio: 1.2 (ref 1.2–2.2)
Albumin: 3.9 g/dL (ref 3.5–5.5)
BILIRUBIN TOTAL: 0.4 mg/dL (ref 0.0–1.2)
BUN/Creatinine Ratio: 6 — ABNORMAL LOW (ref 9–20)
BUN: 7 mg/dL (ref 6–24)
CHLORIDE: 98 mmol/L (ref 96–106)
CO2: 27 mmol/L (ref 18–29)
Calcium: 9.1 mg/dL (ref 8.7–10.2)
Creatinine, Ser: 1.12 mg/dL (ref 0.76–1.27)
GFR calc non Af Amer: 72 mL/min/{1.73_m2} (ref >59)
GFR, EST AFRICAN AMERICAN: 83 mL/min/{1.73_m2} (ref >59)
GLUCOSE: 136 mg/dL — AB (ref 65–99)
Globulin, Total: 3.2 g/dL (ref 1.5–4.5)
Potassium: 3.9 mmol/L (ref 3.5–5.2)
Sodium: 142 mmol/L (ref 134–144)
TOTAL PROTEIN: 7.1 g/dL (ref 6.0–8.5)

## 2016-07-21 LAB — TSH: TSH: 0.577 u[IU]/mL (ref 0.450–4.500)

## 2016-07-21 LAB — PSA: Prostate Specific Ag, Serum: 0.5 ng/mL (ref 0.0–4.0)

## 2016-07-21 LAB — HEMOGLOBIN A1C
Est. average glucose Bld gHb Est-mCnc: 117 mg/dL
Hgb A1c MFr Bld: 5.7 % — ABNORMAL HIGH (ref 4.8–5.6)

## 2016-07-23 ENCOUNTER — Telehealth: Payer: Self-pay

## 2016-07-23 NOTE — Telephone Encounter (Signed)
Called pt with results. PT verbalized understanding. 

## 2016-07-23 NOTE — Telephone Encounter (Signed)
-----   Message from Jacelyn Pi, NP sent at 07/22/2016  5:42 PM EDT ----- Labs are stable. A1C is in the prediabetes range. Encourage healthy dietary changes and exercise.

## 2016-08-05 ENCOUNTER — Ambulatory Visit: Payer: Self-pay | Admitting: Family Medicine

## 2016-08-05 DIAGNOSIS — I10 Essential (primary) hypertension: Secondary | ICD-10-CM

## 2016-08-05 DIAGNOSIS — I6300 Cerebral infarction due to thrombosis of unspecified precerebral artery: Secondary | ICD-10-CM

## 2016-08-05 DIAGNOSIS — E785 Hyperlipidemia, unspecified: Secondary | ICD-10-CM

## 2016-08-05 MED ORDER — APIXABAN 5 MG PO TABS: ORAL_TABLET | ORAL | 3 refills | Status: DC

## 2016-08-05 NOTE — Progress Notes (Signed)
BP 115/79 (BP Location: Left Arm, Patient Position: Sitting, Cuff Size: Normal)   Pulse 60   Temp 97.7 F (36.5 C)   Ht '5\' 10"'$  (1.778 m)   Wt 151 lb 9.6 oz (68.8 kg)   BMI 21.75 kg/m    Subjective:    Patient ID: Mitchell Dixon, male    DOB: [birth year], 61 y.o.   MRN: [id number]  HPI: Mitchell Dixon is a 61 y.o. male  Chief Complaint  Patient presents with  . Follow-up    refill medications   Patient doing well on medications taken Eliquis without bleeding bruising issues. Taking Lipitor 40 review of labs from 2 weeks ago indicating good control of cholesterol with normal liver function. Blood pressure doing well with no complaints takes medication without problems and potassium is normal.  Relevant past medical, surgical, family and social history reviewed and updated as indicated. Interim medical history since our last visit reviewed. Allergies and medications reviewed and updated.  Review of Systems  Constitutional: Negative.   Respiratory: Negative.   Cardiovascular: Negative.     Per HPI unless specifically indicated above     Objective:    BP 115/79 (BP Location: Left Arm, Patient Position: Sitting, Cuff Size: Normal)   Pulse 60   Temp 97.7 F (36.5 C)   Ht '5\' 10"'$  (1.778 m)   Wt 151 lb 9.6 oz (68.8 kg)   BMI 21.75 kg/m   Wt Readings from Last 3 Encounters:  08/05/16 151 lb 9.6 oz (68.8 kg)  07/20/16 158 lb (71.7 kg)  07/03/16 169 lb (76.7 kg)    Physical Exam  Constitutional: He is oriented to person, place, and time. He appears well-developed and well-nourished.  HENT:  Head: Normocephalic and atraumatic.  Eyes: Conjunctivae and EOM are normal.  Neck: Normal range of motion.  Cardiovascular: Normal rate, regular rhythm and normal heart sounds.   Pulmonary/Chest: Effort normal and breath sounds normal.  Musculoskeletal: Normal range of motion.  Neurological: He is alert and oriented to person, place, and time.  Skin: No erythema.   Psychiatric: He has a normal mood and affect. His behavior is normal. Judgment and thought content normal.    Results for orders placed or performed in visit on 07/20/16  Comp Met (CMET)  Result Value Ref Range   Glucose 136 (H) 65 - 99 mg/dL   BUN 7 6 - 24 mg/dL   Creatinine, Ser 1.12 0.76 - 1.27 mg/dL   GFR calc non Af Amer 72 >59 mL/min/1.73   GFR calc Af Amer 83 >59 mL/min/1.73   BUN/Creatinine Ratio 6 (L) 9 - 20   Sodium 142 134 - 144 mmol/L   Potassium 3.9 3.5 - 5.2 mmol/L   Chloride 98 96 - 106 mmol/L   CO2 27 18 - 29 mmol/L   Calcium 9.1 8.7 - 10.2 mg/dL   Total Protein 7.1 6.0 - 8.5 g/dL   Albumin 3.9 3.5 - 5.5 g/dL   Globulin, Total 3.2 1.5 - 4.5 g/dL   Albumin/Globulin Ratio 1.2 1.2 - 2.2   Bilirubin Total 0.4 0.0 - 1.2 mg/dL   Alkaline Phosphatase 94 39 - 117 IU/L   AST 18 0 - 40 IU/L   ALT 19 0 - 44 IU/L  Lipid panel  Result Value Ref Range   Cholesterol, Total 114 100 - 199 mg/dL   Triglycerides 95 0 - 149 mg/dL   HDL 28 (L) >39 mg/dL   VLDL Cholesterol Cal 19 5 - 40 mg/dL  LDL Calculated 67 0 - 99 mg/dL   Chol/HDL Ratio 4.1 0.0 - 5.0 ratio  TSH  Result Value Ref Range   TSH 0.577 0.450 - 4.500 uIU/mL  Hemoglobin A1c  Result Value Ref Range   Hgb A1c MFr Bld 5.7 (H) 4.8 - 5.6 %   Est. average glucose Bld gHb Est-mCnc 117 mg/dL  PSA  Result Value Ref Range   Prostate Specific Ag, Serum 0.5 0.0 - 4.0 ng/mL      Assessment & Plan:   Problem List Items Addressed This Visit      Cardiovascular and Mediastinum   CVA (cerebral vascular accident) [facility])    Patient making good recovery and strength especially of his right leg. No issues with Eliquis. We will need to determine how long patient remains on anticoagulation will check with Dr. Posey Pronto.      Relevant Medications   apixaban (ELIQUIS) 5 MG TABS tablet   Hypertension    The current medical regimen is effective;  continue present plan and medications.       Relevant Medications   apixaban  (ELIQUIS) 5 MG TABS tablet     Other   Hyperlipidemia    The current medical regimen is effective;  continue present plan and medications.       Relevant Medications   apixaban (ELIQUIS) 5 MG TABS tablet       Follow up plan: Return in about 2 months (around 10/05/2016) for CBC, BMP, .

## 2016-08-05 NOTE — Assessment & Plan Note (Signed)
The current medical regimen is effective;  continue present plan and medications.  

## 2016-08-05 NOTE — Assessment & Plan Note (Signed)
Patient making good recovery and strength especially of his right leg. No issues with Eliquis. We will need to determine how long patient remains on anticoagulation will check with Dr. Allena Katz.

## 2016-08-10 ENCOUNTER — Other Ambulatory Visit: Payer: Self-pay

## 2016-08-12 ENCOUNTER — Ambulatory Visit: Payer: Self-pay

## 2016-08-13 ENCOUNTER — Ambulatory Visit: Payer: Self-pay | Admitting: Pharmacy Technician

## 2016-08-13 DIAGNOSIS — Z79899 Other long term (current) drug therapy: Secondary | ICD-10-CM

## 2016-08-13 NOTE — Progress Notes (Signed)
Completed [facility] application and contract.  Patient agreed to all terms of the [facility] contract.   Patient approved to receive medication assistance through 2018 at [facility], as long as eligibility continues to be met.   Provided patient with Civil engineer, contracting based on his particular needs.    Eliquis Prescription Application completed with patient.  Forwarded to [facility] for signature.  Sending request to [facility] for prescription for Eliquis to fill samples of Eliquis to bridge patient until medication arrives from [employer].  Upon receipt of signed application from provider, Eliquis Prescription Application will be submitted to [place].  [facility]

## 2016-08-19 ENCOUNTER — Other Ambulatory Visit: Payer: Self-pay | Admitting: Urology

## 2016-08-19 DIAGNOSIS — I6300 Cerebral infarction due to thrombosis of unspecified precerebral artery: Secondary | ICD-10-CM

## 2016-08-19 MED ORDER — APIXABAN 5 MG PO TABS: ORAL_TABLET | ORAL | 3 refills | Status: DC

## 2016-08-24 ENCOUNTER — Telehealth: Payer: Self-pay

## 2016-08-24 ENCOUNTER — Other Ambulatory Visit: Payer: Self-pay | Admitting: Adult Health Nurse Practitioner

## 2016-08-24 DIAGNOSIS — I6300 Cerebral infarction due to thrombosis of unspecified precerebral artery: Secondary | ICD-10-CM

## 2016-08-24 MED ORDER — APIXABAN 5 MG PO TABS: ORAL_TABLET | ORAL | 3 refills | Status: DC

## 2016-08-24 NOTE — Telephone Encounter (Signed)
Received PAP application from [facility] for Eliquis placed for provider to sign. 

## 2016-08-24 NOTE — Telephone Encounter (Signed)
Placed signed application/script in [facility] folder for pickup. 

## 2016-09-14 ENCOUNTER — Telehealth: Payer: Self-pay | Admitting: Pharmacist

## 2016-09-14 NOTE — Telephone Encounter (Signed)
09/14/16 Eliquis 5mg  Take 1 tablet two times a day, Faxed [employer] application for PAP.Forde Radon

## 2016-09-30 ENCOUNTER — Encounter: Payer: Self-pay | Admitting: Pharmacist

## 2016-09-30 ENCOUNTER — Other Ambulatory Visit: Payer: Self-pay | Admitting: Internal Medicine

## 2016-09-30 ENCOUNTER — Ambulatory Visit: Payer: Self-pay | Admitting: Pharmacist

## 2016-09-30 VITALS — BP 90/60 | Ht 70.0 in | Wt 145.0 lb

## 2016-09-30 DIAGNOSIS — Z79899 Other long term (current) drug therapy: Secondary | ICD-10-CM

## 2016-09-30 NOTE — Progress Notes (Signed)
[facility] Visit Note  Patient: Mitchell Dixon MRN: [id number] Date of Birth: [birth year] PCP: Patient, No Pcp Per   Koren Shiver 61 y.o. male presents for an Initial MTM visit today.  BP 90/60   Ht 5\' 10"  (1.778 m)   Wt 145 lb (65.8 kg)   BMI 20.81 kg/m   Patient Information   Past Medical History:  Diagnosis Date  . Essential hypertension       Past Surgical History:  Procedure Laterality Date  . HERNIA REPAIR       Family History  Problem Relation Age of Onset  . Dementia Mother   . Hypertension Mother   . Pneumonia Father   . Hypertension Sister     New Diagnoses (since last visit):   Family Support: sister  Lifestyle Diet: Lunch:  Eats once daily at lunch - hamburger, boiled chicken, bbq sandwich  Drinks: water             History  Alcohol Use No      History  Smoking Status  . Current Every Day Smoker  . Packs/day: 1.00  . Years: 30.00  . Types: Cigarettes  Smokeless Tobacco  . Never Used      Health Maintenance  Topic Date Due  . Hepatitis C Screening  [birth year]  . HIV Screening  08/22/1972  . TETANUS/TDAP  08/22/1976  . COLONOSCOPY  08/23/2007  . INFLUENZA VACCINE  09/29/2016   Outpatient Encounter Prescriptions as of 09/30/2016  Medication Sig  . apixaban (ELIQUIS) 5 MG TABS tablet Take 1 tab (5 mg ) bid  . atorvastatin (LIPITOR) 40 MG tablet Take 1 tablet (40 mg total) by mouth daily at 6 PM.  . hydrochlorothiazide (HYDRODIURIL) 25 MG tablet Take 1 tablet (25 mg total) by mouth daily.  Marland Kitchen lisinopril (PRINIVIL,ZESTRIL) 20 MG tablet Take 1 tablet (20 mg total) by mouth daily.  . metoprolol tartrate (LOPRESSOR) 25 MG tablet Take 1 tablet (25 mg total) by mouth 2 (two) times daily.  . potassium chloride 20 MEQ TBCR Take 20 mEq by mouth daily.   No facility-administered encounter medications on file as of 09/30/2016.     Assessment and Plan:   Compliance/Adherance: pt knows 3W's of all his  medications and only needed prompting on the names. He states that he never misses doses.  HTN: BP today was 90/60. Well under goal of <140/90. States that BP at home has bee 80-85/60 lately. Pt states that he has been experiencing dizziness multiple times daily. PT is taking lisinopril, metoprolol, and HCTZ for BP/fluid. Will ask provider to consider decreasing doses on Metoprolol to 12.5mg  bid and stop HCTZ.   HLD; TC 114; TG 95; HDL 28; VLDL 19; LDL 67; pt doing well on Atorvastatin 40mg .  CVA: pt doing well with no SEs. Know what signs and sx's to look for for bleeding - bruising, blood in urine or stool, and bleeding gums. States that he has not had any trouble with any of this on Eliquis. Pt is compliant and reports that he does not miss doses. Pt does mention that his appetite has still not returned to normal and that he only eats once a day. Encouraged pt to eat small meals throughout the day. He also states that he has trouble chewing sometimes since his stroke.  Smoking Cessation: has decreased from 1 PPD to 2 cigs/week. Pt has almost stopped after coming home from hospital.   Denice Paradise, PharmD, RPh [facility]) [phone]

## 2016-10-05 ENCOUNTER — Other Ambulatory Visit: Payer: Self-pay

## 2016-10-05 DIAGNOSIS — I10 Essential (primary) hypertension: Secondary | ICD-10-CM

## 2016-10-06 LAB — CBC
HEMOGLOBIN: 11.2 g/dL — AB (ref 13.0–17.7)
Hematocrit: 33 % — ABNORMAL LOW (ref 37.5–51.0)
MCH: 31.5 pg (ref 26.6–33.0)
MCHC: 33.9 g/dL (ref 31.5–35.7)
MCV: 93 fL (ref 79–97)
PLATELETS: 212 10*3/uL (ref 150–379)
RBC: 3.55 x10E6/uL — ABNORMAL LOW (ref 4.14–5.80)
RDW: 13.6 % (ref 12.3–15.4)
WBC: 9.4 10*3/uL (ref 3.4–10.8)

## 2016-10-06 LAB — COMPREHENSIVE METABOLIC PANEL
A/G RATIO: 1.7 (ref 1.2–2.2)
ALT: 24 IU/L (ref 0–44)
AST: 21 IU/L (ref 0–40)
Albumin: 4.4 g/dL (ref 3.5–5.5)
Alkaline Phosphatase: 102 IU/L (ref 39–117)
BILIRUBIN TOTAL: 0.4 mg/dL (ref 0.0–1.2)
BUN/Creatinine Ratio: 9 (ref 9–20)
BUN: 16 mg/dL (ref 6–24)
CHLORIDE: 99 mmol/L (ref 96–106)
CO2: 24 mmol/L (ref 20–29)
Calcium: 9.6 mg/dL (ref 8.7–10.2)
Creatinine, Ser: 1.77 mg/dL — ABNORMAL HIGH (ref 0.76–1.27)
GFR calc non Af Amer: 41 mL/min/{1.73_m2} — ABNORMAL LOW (ref >59)
GFR, EST AFRICAN AMERICAN: 48 mL/min/{1.73_m2} — AB (ref >59)
Globulin, Total: 2.6 g/dL (ref 1.5–4.5)
Glucose: 85 mg/dL (ref 65–99)
POTASSIUM: 4.7 mmol/L (ref 3.5–5.2)
Sodium: 139 mmol/L (ref 134–144)
TOTAL PROTEIN: 7 g/dL (ref 6.0–8.5)

## 2016-10-07 ENCOUNTER — Ambulatory Visit: Payer: Self-pay | Admitting: Internal Medicine

## 2016-10-07 VITALS — BP 97/72 | HR 68 | Temp 98.1°F | Ht 70.0 in | Wt 146.2 lb

## 2016-10-07 DIAGNOSIS — N289 Disorder of kidney and ureter, unspecified: Secondary | ICD-10-CM

## 2016-10-07 DIAGNOSIS — I513 Intracardiac thrombosis, not elsewhere classified: Principal | ICD-10-CM

## 2016-10-07 DIAGNOSIS — I639 Cerebral infarction, unspecified: Secondary | ICD-10-CM

## 2016-10-07 DIAGNOSIS — I10 Essential (primary) hypertension: Secondary | ICD-10-CM

## 2016-10-07 DIAGNOSIS — Z7901 Long term (current) use of anticoagulants: Secondary | ICD-10-CM

## 2016-10-07 DIAGNOSIS — I240 Acute coronary thrombosis not resulting in myocardial infarction: Secondary | ICD-10-CM

## 2016-10-07 DIAGNOSIS — I959 Hypotension, unspecified: Secondary | ICD-10-CM

## 2016-10-07 MED ORDER — HYDROCHLOROTHIAZIDE 25 MG PO TABS: 25.0000 mg | ORAL_TABLET | ORAL | 3 refills | Status: DC

## 2016-10-07 NOTE — Patient Instructions (Signed)
Do not take your fluid pill (hydrochlorothiazide) for the next 3 days.  Starting Monday 8/13, take the fluid pill just on Mondays, Wednesdays, and Fridays.  Recheck at the Clinic in about 2 weeks, with labs (BMP) first to check blood pressure/kidney irritation situation. We will have you followup with cardiology for an ultrasound of the heart to recheck the clot in your left heart.  This will help [place] make a decision about what type of blood thinner you may need to take long term.

## 2016-10-07 NOTE — Progress Notes (Signed)
Patient: Mitchell Dixon Male    DOB: 04-Mar-1955   61 y.o.   MRN: [id number] Visit Date: 10/07/2016  Today's Provider: Eustace Moore, MD   No chief complaint on file.  Subjective:    HPI  Checks blood pressure every am about 11, takes meds 8:30 or 9am.  BPs running low for about the last week, as low as 61/47, with some fatigue/malaise and indigestion.  No CP.  Equivocal dyspnea.  No black stools.  No syncope/presyncope, no falls.  BP improved a little yesterday and was 97 systolic today.   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 Labs a week ago with new renal insufficiency, creat 1.77.    No Known Allergies Previous Medications   APIXABAN (ELIQUIS) 5 MG TABS TABLET    Take 1 tab (5 mg ) bid   ATORVASTATIN (LIPITOR) 40 MG TABLET    TAKE ONE TABLET BY MOUTH EVERY EVENING AT 6:00pm   HYDROCHLOROTHIAZIDE (HYDRODIURIL) 25 MG TABLET    Take 1 tablet (25 mg total) by mouth daily.   LISINOPRIL (PRINIVIL,ZESTRIL) 20 MG TABLET    Take 1 tablet (20 mg total) by mouth daily.   METOPROLOL TARTRATE (LOPRESSOR) 25 MG TABLET    Take 1 tablet (25 mg total) by mouth 2 (two) times daily.   POTASSIUM CHLORIDE 20 MEQ TBCR    Take 20 mEq by mouth daily.    Review of Systems  All other systems reviewed and are negative.   Social History  Substance Use Topics  . Smoking status: Current Every Day Smoker    Packs/day: 1.00    Years: 30.00    Types: Cigarettes  . Smokeless tobacco: Never Used  . Alcohol use No   Objective:   Wt 146 lb 3.2 oz (66.3 kg)   BMI 20.98 kg/m   Physical Exam  Constitutional: He is oriented to person, place, and time. No distress.  Alert, nicely groomed  HENT:  Head: Atraumatic.  Eyes:  Conjugate gaze, no eye redness/drainage  Neck: Neck supple.  Cardiovascular: Normal rate and regular rhythm.   Pulmonary/Chest: No respiratory distress. He has no wheezes. He has no rales.  Lungs clear, symmetric breath sounds   Abdominal: He exhibits no distension.  Musculoskeletal: Normal range of motion.  No leg swelling Able to walk into the clinic independently  Neurological: He is alert and oriented to person, place, and time.  Skin: Skin is warm and dry.  No cyanosis  Nursing note and vitals reviewed.       Assessment & Plan:     Hypotension, acute renal insufficiency:  ? Dehydration? Does not appear septic or anaphylactic.  Cannot exclude pump dysfunction, but not clearly dyspneic, no clear anginal equivalent.  Do not take your fluid pill (hydrochlorothiazide) for the next 3 days.  Starting Monday 8/13, take the fluid pill just on Mondays, Wednesdays, and Fridays.  Recheck at the Clinic in about 2 weeks, with labs (BMP) first to check blood pressure/kidney irritation situation.  LV thrombus, small vessel CVA:  We will have you followup with cardiology for an ultrasound of the heart to recheck the clot in your left heart.  This will help [place] make a decision about what type of blood thinner you may need to take long term.  May also help determine source of recent difficulty with low blood pressures.         Eustace Moore, MD   [facility]

## 2016-10-19 ENCOUNTER — Other Ambulatory Visit: Payer: Self-pay

## 2016-10-19 DIAGNOSIS — I10 Essential (primary) hypertension: Secondary | ICD-10-CM

## 2016-10-20 LAB — COMPREHENSIVE METABOLIC PANEL
ALBUMIN: 4.2 g/dL (ref 3.5–5.5)
ALT: 13 IU/L (ref 0–44)
AST: 20 IU/L (ref 0–40)
Albumin/Globulin Ratio: 1.7 (ref 1.2–2.2)
Alkaline Phosphatase: 107 IU/L (ref 39–117)
BUN / CREAT RATIO: 8 — AB (ref 9–20)
BUN: 12 mg/dL (ref 6–24)
Bilirubin Total: 0.4 mg/dL (ref 0.0–1.2)
CO2: 26 mmol/L (ref 20–29)
Calcium: 9.4 mg/dL (ref 8.7–10.2)
Chloride: 98 mmol/L (ref 96–106)
Creatinine, Ser: 1.55 mg/dL — ABNORMAL HIGH (ref 0.76–1.27)
GFR, EST AFRICAN AMERICAN: 56 mL/min/{1.73_m2} — AB (ref >59)
GFR, EST NON AFRICAN AMERICAN: 48 mL/min/{1.73_m2} — AB (ref >59)
GLUCOSE: 102 mg/dL — AB (ref 65–99)
Globulin, Total: 2.5 g/dL (ref 1.5–4.5)
POTASSIUM: 4.2 mmol/L (ref 3.5–5.2)
SODIUM: 139 mmol/L (ref 134–144)
TOTAL PROTEIN: 6.7 g/dL (ref 6.0–8.5)

## 2016-10-26 ENCOUNTER — Ambulatory Visit: Payer: Self-pay | Admitting: Adult Health Nurse Practitioner

## 2016-10-26 VITALS — BP 137/94 | HR 72 | Temp 98.1°F | Ht 70.0 in | Wt 150.8 lb

## 2016-10-26 DIAGNOSIS — I10 Essential (primary) hypertension: Secondary | ICD-10-CM

## 2016-10-26 MED ORDER — LISINOPRIL 40 MG PO TABS: 20.0000 mg | ORAL_TABLET | Freq: Every day | ORAL | 3 refills | Status: DC

## 2016-10-26 NOTE — Progress Notes (Signed)
   Subjective:    Patient ID: Mitchell Dixon, male    DOB: 1955/09/22, 61 y.o.   MRN: [id number]  HPI   Pt here for a BMP and BP check due to titration of medication.  Pt reports taking HCTZ everyday b/c his BP was running high. Prescribed for MWF. Pt reports daily dizzy spells but no falling. He reports when he stopped taking it, his dizzy spells went away. Denies swelling when taking it.  Pt denies chest pain, SOB.  Patient Active Problem List   Diagnosis Date Noted  . Hypertension 07/20/2016  . Hyperlipidemia 07/20/2016  . CVA (cerebral vascular accident) (HCC) 07/03/2016   Allergies as of 10/26/2016   No Known Allergies     Medication List       Accurate as of 10/26/16  7:29 PM. Always use your most recent med list.          apixaban 5 MG Tabs tablet Commonly known as:  ELIQUIS Take 1 tab (5 mg ) bid   atorvastatin 40 MG tablet Commonly known as:  LIPITOR TAKE ONE TABLET BY MOUTH EVERY EVENING AT 6:00pm   hydrochlorothiazide 25 MG tablet Commonly known as:  HYDRODIURIL Take 1 tablet (25 mg total) by mouth 3 (three) times a week.   lisinopril 20 MG tablet Commonly known as:  PRINIVIL,ZESTRIL Take 1 tablet (20 mg total) by mouth daily.   metoprolol tartrate 25 MG tablet Commonly known as:  LOPRESSOR Take 1 tablet (25 mg total) by mouth 2 (two) times daily.   Potassium Chloride ER 20 MEQ Tbcr Take 20 mEq by mouth daily.        Review of Systems  All other systems reviewed and are negative.       Objective:   Physical Exam  Constitutional: He is oriented to person, place, and time. He appears well-developed and well-nourished.  Cardiovascular: Normal rate, regular rhythm and normal heart sounds.   Pulmonary/Chest: Effort normal and breath sounds normal.  Neurological: He is alert and oriented to person, place, and time.    BP (!) 137/94 (BP Location: Left Arm, Patient Position: Sitting, Cuff Size: Normal)   Pulse 72   Temp 98.1 F (36.7 C)    Ht 5\' 10"  (1.778 m)   Wt 150 lb 12.8 oz (68.4 kg)   BMI 21.64 kg/m        Assessment & Plan:   Stop HCTZ.  Increase Lisinopril to 40mg .  Stop Potassium.  F/u in 2 weeks for BP check.

## 2016-10-26 NOTE — Patient Instructions (Signed)
Stop taking Hydrochlorothiazide and Potassium. Take 40 mg of Lisinopril.

## 2016-11-11 ENCOUNTER — Ambulatory Visit: Payer: Self-pay

## 2016-12-03 ENCOUNTER — Other Ambulatory Visit: Payer: Self-pay | Admitting: Adult Health Nurse Practitioner

## 2016-12-09 ENCOUNTER — Ambulatory Visit: Payer: Self-pay

## 2016-12-21 ENCOUNTER — Telehealth: Payer: Self-pay | Admitting: Adult Health Nurse Practitioner

## 2016-12-21 NOTE — Telephone Encounter (Signed)
Wants to be a new pt. Call back

## 2017-01-18 ENCOUNTER — Other Ambulatory Visit: Payer: Self-pay | Admitting: Adult Health Nurse Practitioner

## 2017-02-03 ENCOUNTER — Ambulatory Visit: Payer: Self-pay | Admitting: Adult Health Nurse Practitioner

## 2017-02-03 VITALS — BP 190/128 | HR 63 | Temp 98.0°F | Wt 153.0 lb

## 2017-02-03 DIAGNOSIS — I10 Essential (primary) hypertension: Secondary | ICD-10-CM

## 2017-02-03 MED ORDER — LISINOPRIL 20 MG PO TABS: 40.0000 mg | ORAL_TABLET | Freq: Every day | ORAL | 0 refills | Status: DC

## 2017-02-03 NOTE — Progress Notes (Signed)
  Patient: Mitchell Dixon Male    DOB: [birth year]   61 y.o.   MRN: [id number] Visit Date: 02/03/2017  Today's Provider: [facility]   Chief Complaint  Patient presents with  . Follow-up    f/u with BP   Subjective:    Mitchell Dixon is 61 y/o male with HTN here for BP check after changing meds.  Recently d/ced HCTZ and but did not increase lisinopril d/t prescription mixup.  No chest pain. No HA. No n/v        No Known Allergies This SmartLink is deprecated. Use AVSMEDLIST instead to display the medication list for a patient.  Review of Systems  All other systems reviewed and are negative.   Social History   Tobacco Use  . Smoking status: Current Every Day Smoker    Packs/day: 0.25    Years: 30.00    Pack years: 7.50    Types: Cigarettes  . Smokeless tobacco: Never Used  Substance Use Topics  . Alcohol use: No   Objective:   BP (!) 190/128 (BP Location: Left Arm, Patient Position: Sitting, Cuff Size: Normal)   Pulse 63   Temp 98 F (36.7 C) (Oral)   Wt 153 lb (69.4 kg)   BMI 21.95 kg/m   Physical Exam  Constitutional: He is oriented to person, place, and time. He appears well-developed and well-nourished.  HENT:  Head: Normocephalic.  Eyes: Conjunctivae are normal.  Cardiovascular: Normal rate and regular rhythm.  Murmur heard.  Crescendo systolic murmur is present with a grade of 3/6. Pulmonary/Chest: Effort normal and breath sounds normal.  Abdominal: Soft. Bowel sounds are normal.  Neurological: He is alert and oriented to person, place, and time.  Skin: Skin is warm and dry.        Assessment & Plan:     Mitchell Dixon is 61 y/o male with HTN here for BP check after changing meds.  HTN Uncontrolled today to 190/128, but asymptomatic; concerning given hx of CVA. Recommended taking 25 mg HCTZ tonight, then starting 40 mg lisinopril tomorrow.   Murmur 3/6 systolic crescendo murmur loudest at Right upper sternal border.  Since patient is asymptomatic, will ctm unless he develops any symptoms. Will consider cards referral in future.       [facility]   [facility]

## 2017-02-17 ENCOUNTER — Ambulatory Visit: Payer: Self-pay | Admitting: Adult Health Nurse Practitioner

## 2017-02-17 VITALS — BP 187/126 | HR 72 | Temp 97.9°F | Wt 155.7 lb

## 2017-02-17 DIAGNOSIS — I10 Essential (primary) hypertension: Secondary | ICD-10-CM

## 2017-02-17 DIAGNOSIS — I6300 Cerebral infarction due to thrombosis of unspecified precerebral artery: Secondary | ICD-10-CM

## 2017-02-17 MED ORDER — APIXABAN 5 MG PO TABS: ORAL_TABLET | ORAL | 3 refills | Status: DC

## 2017-02-17 MED ORDER — AMLODIPINE BESYLATE 5 MG PO TABS: 5.0000 mg | ORAL_TABLET | Freq: Every day | ORAL | 3 refills | Status: DC

## 2017-02-17 NOTE — Progress Notes (Signed)
  Patient: Mitchell Dixon Male    DOB: 07/21/1955   61 y.o.   MRN: [id number] Visit Date: 02/17/2017  Today's Provider: Jacelyn Pi, NP   No chief complaint on file.  Subjective:    HPI  Here for BP FU- last visit BP was 190/128.  Lisinopril increased to 40mg  daily at last visit.  Off HCTZ due to dizziness. Denies HA, Dizziness or CP.     No Known Allergies This SmartLink is deprecated. Use AVSMEDLIST instead to display the medication list for a patient.  Review of Systems  All other systems reviewed and are negative.   Social History   Tobacco Use  . Smoking status: Current Every Day Smoker    Packs/day: 0.25    Years: 30.00    Pack years: 7.50    Types: Cigarettes  . Smokeless tobacco: Never Used  Substance Use Topics  . Alcohol use: No   Objective:   BP (!) 187/126 (BP Location: Left Arm, Patient Position: Sitting, Cuff Size: Normal)   Pulse 72   Temp 97.9 F (36.6 C) (Oral)   Wt 155 lb 11.2 oz (70.6 kg)   BMI 22.34 kg/m   Physical Exam  Constitutional: He appears well-developed and well-nourished.  HENT:  Head: Normocephalic and atraumatic.  Cardiovascular: Normal rate, regular rhythm and normal heart sounds.  Pulmonary/Chest: Effort normal and breath sounds normal.  Skin: Skin is warm and dry.  Vitals reviewed.       Assessment & Plan:     HTN:  Not controlled.  Goal BP <140/90.  Continue lisinopril and metoprolol. Add Norvasc 5mg  daily.  Encourage low salt diet and exercise.  Reviewed s/s of CVA.   FU in 2 weeks for BP check.  BMET today.  Will check CBC due to previous drop and Eliquis use.        Jacelyn Pi, NP   [facility] [place]

## 2017-02-18 LAB — BASIC METABOLIC PANEL
BUN/Creatinine Ratio: 10 (ref 9–20)
BUN: 12 mg/dL (ref 6–24)
CALCIUM: 9.7 mg/dL (ref 8.7–10.2)
CHLORIDE: 98 mmol/L (ref 96–106)
CO2: 26 mmol/L (ref 20–29)
CREATININE: 1.18 mg/dL (ref 0.76–1.27)
GFR calc non Af Amer: 67 mL/min/{1.73_m2} (ref >59)
GFR, EST AFRICAN AMERICAN: 78 mL/min/{1.73_m2} (ref >59)
Glucose: 98 mg/dL (ref 65–99)
Potassium: 3.6 mmol/L (ref 3.5–5.2)
Sodium: 141 mmol/L (ref 134–144)

## 2017-02-18 LAB — CBC
HEMATOCRIT: 40.7 % (ref 37.5–51.0)
HEMOGLOBIN: 13.6 g/dL (ref 13.0–17.7)
MCH: 31.8 pg (ref 26.6–33.0)
MCHC: 33.4 g/dL (ref 31.5–35.7)
MCV: 95 fL (ref 79–97)
Platelets: 246 10*3/uL (ref 150–379)
RBC: 4.28 x10E6/uL (ref 4.14–5.80)
RDW: 13.3 % (ref 12.3–15.4)
WBC: 7.6 10*3/uL (ref 3.4–10.8)

## 2017-02-23 ENCOUNTER — Other Ambulatory Visit: Payer: Self-pay

## 2017-02-23 ENCOUNTER — Inpatient Hospital Stay
Admission: EM | Admit: 2017-02-23 | Discharge: 2017-02-26 | DRG: 270 | Disposition: A | Discharge status: Other Acute Inpatient Hospital | Payer: Medicaid Other | Attending: Vascular Surgery | Admitting: Vascular Surgery

## 2017-02-23 ENCOUNTER — Inpatient Hospital Stay: Payer: Medicaid Other

## 2017-02-23 ENCOUNTER — Emergency Department: Payer: Medicaid Other | Admitting: Anesthesiology

## 2017-02-23 ENCOUNTER — Emergency Department: Payer: Medicaid Other

## 2017-02-23 ENCOUNTER — Encounter: Payer: Self-pay | Admitting: Emergency Medicine

## 2017-02-23 ENCOUNTER — Encounter
Admission: EM | Disposition: A | Discharge status: Other Acute Inpatient Hospital | Payer: Self-pay | Source: Home / Self Care | Attending: Vascular Surgery

## 2017-02-23 DIAGNOSIS — D696 Thrombocytopenia, unspecified: Secondary | ICD-10-CM | POA: Diagnosis present

## 2017-02-23 DIAGNOSIS — I708 Atherosclerosis of other arteries: Secondary | ICD-10-CM | POA: Diagnosis present

## 2017-02-23 DIAGNOSIS — G9511 Acute infarction of spinal cord (embolic) (nonembolic): Secondary | ICD-10-CM | POA: Diagnosis present

## 2017-02-23 DIAGNOSIS — I97618 Postprocedural hemorrhage and hematoma of a circulatory system organ or structure following other circulatory system procedure: Secondary | ICD-10-CM | POA: Diagnosis not present

## 2017-02-23 DIAGNOSIS — I998 Other disorder of circulatory system: Secondary | ICD-10-CM | POA: Diagnosis present

## 2017-02-23 DIAGNOSIS — Z7901 Long term (current) use of anticoagulants: Secondary | ICD-10-CM | POA: Diagnosis not present

## 2017-02-23 DIAGNOSIS — N280 Ischemia and infarction of kidney: Secondary | ICD-10-CM | POA: Diagnosis present

## 2017-02-23 DIAGNOSIS — N186 End stage renal disease: Secondary | ICD-10-CM | POA: Diagnosis present

## 2017-02-23 DIAGNOSIS — I959 Hypotension, unspecified: Secondary | ICD-10-CM | POA: Diagnosis not present

## 2017-02-23 DIAGNOSIS — I10 Essential (primary) hypertension: Secondary | ICD-10-CM

## 2017-02-23 DIAGNOSIS — I120 Hypertensive chronic kidney disease with stage 5 chronic kidney disease or end stage renal disease: Secondary | ICD-10-CM | POA: Diagnosis present

## 2017-02-23 DIAGNOSIS — I743 Embolism and thrombosis of arteries of the lower extremities: Secondary | ICD-10-CM | POA: Diagnosis not present

## 2017-02-23 DIAGNOSIS — I7102 Dissection of abdominal aorta: Secondary | ICD-10-CM | POA: Diagnosis present

## 2017-02-23 DIAGNOSIS — K559 Vascular disorder of intestine, unspecified: Secondary | ICD-10-CM | POA: Diagnosis present

## 2017-02-23 DIAGNOSIS — N179 Acute kidney failure, unspecified: Secondary | ICD-10-CM | POA: Diagnosis present

## 2017-02-23 DIAGNOSIS — I745 Embolism and thrombosis of iliac artery: Secondary | ICD-10-CM | POA: Diagnosis present

## 2017-02-23 DIAGNOSIS — Y838 Other surgical procedures as the cause of abnormal reaction of the patient, or of later complication, without mention of misadventure at the time of the procedure: Secondary | ICD-10-CM | POA: Diagnosis not present

## 2017-02-23 DIAGNOSIS — R0602 Shortness of breath: Secondary | ICD-10-CM

## 2017-02-23 DIAGNOSIS — M79604 Pain in right leg: Secondary | ICD-10-CM

## 2017-02-23 DIAGNOSIS — I7100 Dissection of unspecified site of aorta: Secondary | ICD-10-CM | POA: Diagnosis present

## 2017-02-23 DIAGNOSIS — G8220 Paraplegia, unspecified: Secondary | ICD-10-CM | POA: Diagnosis present

## 2017-02-23 DIAGNOSIS — I7777 Dissection of artery of lower extremity: Secondary | ICD-10-CM | POA: Diagnosis present

## 2017-02-23 DIAGNOSIS — Z86718 Personal history of other venous thrombosis and embolism: Secondary | ICD-10-CM

## 2017-02-23 DIAGNOSIS — E872 Acidosis: Secondary | ICD-10-CM | POA: Diagnosis present

## 2017-02-23 DIAGNOSIS — Z8673 Personal history of transient ischemic attack (TIA), and cerebral infarction without residual deficits: Secondary | ICD-10-CM

## 2017-02-23 DIAGNOSIS — E785 Hyperlipidemia, unspecified: Secondary | ICD-10-CM | POA: Diagnosis present

## 2017-02-23 DIAGNOSIS — I7410 Embolism and thrombosis of unspecified parts of aorta: Secondary | ICD-10-CM

## 2017-02-23 DIAGNOSIS — Z79899 Other long term (current) drug therapy: Secondary | ICD-10-CM | POA: Diagnosis not present

## 2017-02-23 DIAGNOSIS — I70213 Atherosclerosis of native arteries of extremities with intermittent claudication, bilateral legs: Secondary | ICD-10-CM

## 2017-02-23 DIAGNOSIS — I428 Other cardiomyopathies: Secondary | ICD-10-CM | POA: Diagnosis present

## 2017-02-23 DIAGNOSIS — D62 Acute posthemorrhagic anemia: Secondary | ICD-10-CM | POA: Diagnosis not present

## 2017-02-23 DIAGNOSIS — T80212A Local infection due to central venous catheter, initial encounter: Secondary | ICD-10-CM

## 2017-02-23 DIAGNOSIS — F17210 Nicotine dependence, cigarettes, uncomplicated: Secondary | ICD-10-CM | POA: Diagnosis present

## 2017-02-23 DIAGNOSIS — R109 Unspecified abdominal pain: Secondary | ICD-10-CM

## 2017-02-23 DIAGNOSIS — D6859 Other primary thrombophilia: Secondary | ICD-10-CM | POA: Diagnosis present

## 2017-02-23 HISTORY — PX: EMBOLECTOMY: SHX44

## 2017-02-23 HISTORY — PX: FASCIECTOMY: SHX6525

## 2017-02-23 LAB — CBC WITH DIFFERENTIAL/PLATELET
BASOS PCT: 0 %
Basophils Absolute: 0 10*3/uL (ref 0–0.1)
EOS ABS: 0.1 10*3/uL (ref 0–0.7)
EOS PCT: 1 %
HCT: 43.5 % (ref 40.0–52.0)
Hemoglobin: 14.2 g/dL (ref 13.0–18.0)
Lymphocytes Relative: 49 %
Lymphs Abs: 4.1 10*3/uL — ABNORMAL HIGH (ref 1.0–3.6)
MCH: 32.3 pg (ref 26.0–34.0)
MCHC: 32.6 g/dL (ref 32.0–36.0)
MCV: 99.1 fL (ref 80.0–100.0)
MONO ABS: 0.5 10*3/uL (ref 0.2–1.0)
MONOS PCT: 6 %
Neutro Abs: 3.7 10*3/uL (ref 1.4–6.5)
Neutrophils Relative %: 44 %
Platelets: 177 10*3/uL (ref 150–440)
RBC: 4.4 MIL/uL (ref 4.40–5.90)
RDW: 13.8 % (ref 11.5–14.5)
WBC: 8.4 10*3/uL (ref 3.8–10.6)

## 2017-02-23 LAB — COMPREHENSIVE METABOLIC PANEL
ALBUMIN: 4.4 g/dL (ref 3.5–5.0)
ALT: 26 U/L (ref 17–63)
ANION GAP: 16 — AB (ref 5–15)
AST: 38 U/L (ref 15–41)
Alkaline Phosphatase: 110 U/L (ref 38–126)
BUN: 10 mg/dL (ref 6–20)
CO2: 19 mmol/L — AB (ref 22–32)
Calcium: 9.2 mg/dL (ref 8.9–10.3)
Chloride: 105 mmol/L (ref 101–111)
Creatinine, Ser: 1.71 mg/dL — ABNORMAL HIGH (ref 0.61–1.24)
GFR calc non Af Amer: 42 mL/min — ABNORMAL LOW (ref >60)
GFR, EST AFRICAN AMERICAN: 49 mL/min — AB (ref >60)
GLUCOSE: 144 mg/dL — AB (ref 65–99)
POTASSIUM: 3.6 mmol/L (ref 3.5–5.1)
SODIUM: 140 mmol/L (ref 135–145)
TOTAL PROTEIN: 7.8 g/dL (ref 6.5–8.1)
Total Bilirubin: 0.6 mg/dL (ref 0.3–1.2)

## 2017-02-23 LAB — HEMOGLOBIN AND HEMATOCRIT, BLOOD
HEMATOCRIT: 31.7 % — AB (ref 40.0–52.0)
HEMOGLOBIN: 10.4 g/dL — AB (ref 13.0–18.0)

## 2017-02-23 LAB — TROPONIN I: TROPONIN I: 0.14 ng/mL — AB (ref <0.03)

## 2017-02-23 LAB — APTT: APTT: 30 s (ref 24–36)

## 2017-02-23 LAB — PROTIME-INR
INR: 1.07
Prothrombin Time: 13.8 seconds (ref 11.4–15.2)

## 2017-02-23 LAB — MRSA PCR SCREENING: MRSA BY PCR: NEGATIVE

## 2017-02-23 LAB — GLUCOSE, CAPILLARY: GLUCOSE-CAPILLARY: 93 mg/dL (ref 65–99)

## 2017-02-23 LAB — LACTIC ACID, PLASMA: Lactic Acid, Venous: 5.7 mmol/L (ref 0.5–1.9)

## 2017-02-23 LAB — PREPARE RBC (CROSSMATCH)

## 2017-02-23 LAB — ABO/RH: ABO/RH(D): A POS

## 2017-02-23 SURGERY — EMBOLECTOMY
Anesthesia: Choice | Site: Leg Lower | Laterality: Right | Wound class: Clean

## 2017-02-23 MED ORDER — HYDRALAZINE HCL 20 MG/ML IJ SOLN: 5.0000 mg | INTRAMUSCULAR | Status: DC | PRN

## 2017-02-23 MED ORDER — AMLODIPINE BESYLATE 5 MG PO TABS
5.0000 mg | ORAL_TABLET | Freq: Every day | ORAL | Status: DC
  Administered 2017-02-24: 5 mg via ORAL
  Filled 2017-02-23: qty 1

## 2017-02-23 MED ORDER — HEPARIN SODIUM (PORCINE) 1000 UNIT/ML IJ SOLN
INTRAMUSCULAR | Status: AC
Start: 2017-02-23 — End: 2017-02-23
  Filled 2017-02-23: qty 1

## 2017-02-23 MED ORDER — HEPARIN (PORCINE) IN NACL 100-0.45 UNIT/ML-% IJ SOLN
1100.0000 [IU]/h | INTRAMUSCULAR | Status: DC
  Administered 2017-02-23: 1100 [IU]/h via INTRAVENOUS
  Filled 2017-02-23: qty 250

## 2017-02-23 MED ORDER — HYDROMORPHONE HCL 1 MG/ML IJ SOLN
INTRAMUSCULAR | Status: AC
  Administered 2017-02-23: 1 mg
  Filled 2017-02-23: qty 1

## 2017-02-23 MED ORDER — ROCURONIUM BROMIDE 100 MG/10ML IV SOLN
INTRAVENOUS | Status: DC | PRN
  Administered 2017-02-23 (×2): 10 mg via INTRAVENOUS
  Administered 2017-02-23: 30 mg via INTRAVENOUS
  Administered 2017-02-23: 20 mg via INTRAVENOUS

## 2017-02-23 MED ORDER — ONDANSETRON HCL 4 MG/2ML IJ SOLN
INTRAMUSCULAR | Status: DC | PRN
  Administered 2017-02-23: 4 mg via INTRAVENOUS

## 2017-02-23 MED ORDER — ROCURONIUM BROMIDE 50 MG/5ML IV SOLN
INTRAVENOUS | Status: AC
  Filled 2017-02-23: qty 1

## 2017-02-23 MED ORDER — ACETAMINOPHEN 10 MG/ML IV SOLN
INTRAVENOUS | Status: AC
Start: 2017-02-23 — End: 2017-02-23
  Filled 2017-02-23: qty 100

## 2017-02-23 MED ORDER — ALUM & MAG HYDROXIDE-SIMETH 200-200-20 MG/5ML PO SUSP
15.0000 mL | ORAL | Status: DC | PRN
  Filled 2017-02-23: qty 30

## 2017-02-23 MED ORDER — SODIUM CHLORIDE 0.9 % IV SOLN
INTRAVENOUS | Status: DC | PRN
  Administered 2017-02-23 (×2): via INTRAVENOUS

## 2017-02-23 MED ORDER — POTASSIUM CHLORIDE CRYS ER 20 MEQ PO TBCR
20.0000 meq | EXTENDED_RELEASE_TABLET | Freq: Every day | ORAL | Status: DC | PRN
Start: 2017-02-23 — End: 2017-02-26

## 2017-02-23 MED ORDER — PROPOFOL 10 MG/ML IV BOLUS
INTRAVENOUS | Status: AC
  Filled 2017-02-23: qty 20

## 2017-02-23 MED ORDER — DOCUSATE SODIUM 100 MG PO CAPS
100.0000 mg | ORAL_CAPSULE | Freq: Every day | ORAL | Status: DC
  Administered 2017-02-24: 100 mg via ORAL
  Filled 2017-02-23: qty 1

## 2017-02-23 MED ORDER — EPHEDRINE SULFATE 50 MG/ML IJ SOLN
INTRAMUSCULAR | Status: DC | PRN
Start: 2017-02-23 — End: 2017-02-23
  Administered 2017-02-23: 10 mg via INTRAVENOUS

## 2017-02-23 MED ORDER — DEXAMETHASONE SODIUM PHOSPHATE 10 MG/ML IJ SOLN
INTRAMUSCULAR | Status: AC
  Filled 2017-02-23: qty 1

## 2017-02-23 MED ORDER — SUGAMMADEX SODIUM 200 MG/2ML IV SOLN
INTRAVENOUS | Status: DC | PRN
  Administered 2017-02-23: 140 mg via INTRAVENOUS

## 2017-02-23 MED ORDER — ACETAMINOPHEN 325 MG PO TABS
325.0000 mg | ORAL_TABLET | ORAL | Status: DC | PRN
Start: 2017-02-23 — End: 2017-02-26

## 2017-02-23 MED ORDER — ATORVASTATIN CALCIUM 20 MG PO TABS
20.0000 mg | ORAL_TABLET | Freq: Every day | ORAL | Status: DC
  Administered 2017-02-23 – 2017-02-25 (×3): 20 mg via ORAL
  Filled 2017-02-23 (×3): qty 1

## 2017-02-23 MED ORDER — ONDANSETRON HCL 4 MG/2ML IJ SOLN
INTRAMUSCULAR | Status: AC
  Filled 2017-02-23: qty 2

## 2017-02-23 MED ORDER — PROPOFOL 10 MG/ML IV BOLUS
INTRAVENOUS | Status: DC | PRN
  Administered 2017-02-23: 150 mg via INTRAVENOUS

## 2017-02-23 MED ORDER — LISINOPRIL 20 MG PO TABS
40.0000 mg | ORAL_TABLET | Freq: Every day | ORAL | Status: DC
  Administered 2017-02-24: 40 mg via ORAL
  Filled 2017-02-23: qty 2

## 2017-02-23 MED ORDER — CEFAZOLIN SODIUM-DEXTROSE 2-4 GM/100ML-% IV SOLN
2.0000 g | INTRAVENOUS | Status: AC
  Administered 2017-02-23: 2 g via INTRAVENOUS
  Filled 2017-02-23: qty 100

## 2017-02-23 MED ORDER — MAGNESIUM SULFATE 2 GM/50ML IV SOLN: 2.0000 g | Freq: Every day | INTRAVENOUS | Status: DC | PRN

## 2017-02-23 MED ORDER — CEFAZOLIN SODIUM 1 G IJ SOLR
INTRAMUSCULAR | Status: AC
  Filled 2017-02-23: qty 20

## 2017-02-23 MED ORDER — HEPARIN BOLUS VIA INFUSION
4200.0000 [IU] | Freq: Once | INTRAVENOUS | Status: AC
  Administered 2017-02-23: 4200 [IU] via INTRAVENOUS
  Filled 2017-02-23: qty 4200

## 2017-02-23 MED ORDER — ONDANSETRON HCL 4 MG/2ML IJ SOLN
4.0000 mg | Freq: Once | INTRAMUSCULAR | Status: AC
  Administered 2017-02-23: 4 mg via INTRAVENOUS
  Filled 2017-02-23: qty 2

## 2017-02-23 MED ORDER — EVICEL 5 ML EX KIT
PACK | CUTANEOUS | Status: AC
  Filled 2017-02-23: qty 1

## 2017-02-23 MED ORDER — CEFAZOLIN SODIUM-DEXTROSE 1-4 GM/50ML-% IV SOLN: 1.0000 g | Freq: Three times a day (TID) | INTRAVENOUS | Status: DC

## 2017-02-23 MED ORDER — ACETAMINOPHEN 10 MG/ML IV SOLN
INTRAVENOUS | Status: DC | PRN
  Administered 2017-02-23: 1000 mg via INTRAVENOUS

## 2017-02-23 MED ORDER — ONDANSETRON HCL 4 MG/2ML IJ SOLN
4.0000 mg | Freq: Four times a day (QID) | INTRAMUSCULAR | Status: DC | PRN
  Administered 2017-02-24 – 2017-02-25 (×2): 4 mg via INTRAVENOUS
  Filled 2017-02-23 (×2): qty 2

## 2017-02-23 MED ORDER — DEXAMETHASONE SODIUM PHOSPHATE 10 MG/ML IJ SOLN
INTRAMUSCULAR | Status: DC | PRN
  Administered 2017-02-23: 10 mg via INTRAVENOUS

## 2017-02-23 MED ORDER — MIDAZOLAM HCL 2 MG/2ML IJ SOLN
INTRAMUSCULAR | Status: DC | PRN
  Administered 2017-02-23: 2 mg via INTRAVENOUS

## 2017-02-23 MED ORDER — MORPHINE SULFATE (PF) 2 MG/ML IV SOLN
2.0000 mg | INTRAVENOUS | Status: DC | PRN
  Administered 2017-02-24 – 2017-02-25 (×6): 2 mg via INTRAVENOUS
  Administered 2017-02-26: 1 mg via INTRAVENOUS
  Filled 2017-02-23: qty 1
  Filled 2017-02-23: qty 3
  Filled 2017-02-23 (×3): qty 1

## 2017-02-23 MED ORDER — IOPAMIDOL (ISOVUE-370) INJECTION 76%
125.0000 mL | Freq: Once | INTRAVENOUS | Status: AC | PRN
  Administered 2017-02-23: 125 mL via INTRAVENOUS

## 2017-02-23 MED ORDER — DIPHENHYDRAMINE HCL 50 MG/ML IJ SOLN: 25.0000 mg | Freq: Once | INTRAMUSCULAR | Status: DC

## 2017-02-23 MED ORDER — FENTANYL CITRATE (PF) 100 MCG/2ML IJ SOLN: 25.0000 ug | INTRAMUSCULAR | Status: DC | PRN

## 2017-02-23 MED ORDER — SUGAMMADEX SODIUM 200 MG/2ML IV SOLN
INTRAVENOUS | Status: AC
  Filled 2017-02-23: qty 2

## 2017-02-23 MED ORDER — FAMOTIDINE IN NACL 20-0.9 MG/50ML-% IV SOLN
20.0000 mg | Freq: Two times a day (BID) | INTRAVENOUS | Status: DC
  Administered 2017-02-23 – 2017-02-24 (×2): 20 mg via INTRAVENOUS
  Filled 2017-02-23 (×2): qty 50

## 2017-02-23 MED ORDER — FENTANYL CITRATE (PF) 100 MCG/2ML IJ SOLN
INTRAMUSCULAR | Status: AC
  Filled 2017-02-23: qty 2

## 2017-02-23 MED ORDER — MIDAZOLAM HCL 2 MG/2ML IJ SOLN
INTRAMUSCULAR | Status: AC
  Filled 2017-02-23: qty 2

## 2017-02-23 MED ORDER — SENNOSIDES-DOCUSATE SODIUM 8.6-50 MG PO TABS: 1.0000 | ORAL_TABLET | Freq: Every evening | ORAL | Status: DC | PRN

## 2017-02-23 MED ORDER — METOPROLOL TARTRATE 25 MG PO TABS
25.0000 mg | ORAL_TABLET | Freq: Two times a day (BID) | ORAL | Status: DC
  Administered 2017-02-24 (×2): 25 mg via ORAL
  Filled 2017-02-23 (×2): qty 1

## 2017-02-23 MED ORDER — GUAIFENESIN-DM 100-10 MG/5ML PO SYRP
15.0000 mL | ORAL_SOLUTION | ORAL | Status: DC | PRN
  Filled 2017-02-23: qty 15

## 2017-02-23 MED ORDER — FENTANYL CITRATE (PF) 100 MCG/2ML IJ SOLN
INTRAMUSCULAR | Status: DC | PRN
  Administered 2017-02-23: 25 ug via INTRAVENOUS
  Administered 2017-02-23: 50 ug via INTRAVENOUS
  Administered 2017-02-23: 100 ug via INTRAVENOUS
  Administered 2017-02-23: 75 ug via INTRAVENOUS
  Administered 2017-02-23: 100 ug via INTRAVENOUS

## 2017-02-23 MED ORDER — FENTANYL CITRATE (PF) 250 MCG/5ML IJ SOLN
INTRAMUSCULAR | Status: AC
  Filled 2017-02-23: qty 5

## 2017-02-23 MED ORDER — OXYCODONE-ACETAMINOPHEN 5-325 MG PO TABS: 1.0000 | ORAL_TABLET | ORAL | Status: DC | PRN

## 2017-02-23 MED ORDER — LABETALOL HCL 5 MG/ML IV SOLN: 10.0000 mg | INTRAVENOUS | Status: DC | PRN

## 2017-02-23 MED ORDER — MORPHINE SULFATE (PF) 4 MG/ML IV SOLN
4.0000 mg | Freq: Once | INTRAVENOUS | Status: AC
  Administered 2017-02-23: 4 mg via INTRAVENOUS
  Filled 2017-02-23: qty 1

## 2017-02-23 MED ORDER — DEXTROSE 5 % IV SOLN
Freq: Three times a day (TID) | INTRAVENOUS | Status: AC
  Administered 2017-02-23 – 2017-02-24 (×3): via INTRAVENOUS
  Filled 2017-02-23 (×3): qty 10

## 2017-02-23 MED ORDER — HEPARIN SODIUM (PORCINE) 1000 UNIT/ML IJ SOLN
INTRAMUSCULAR | Status: DC | PRN
  Administered 2017-02-23: 2000 [IU] via INTRAVENOUS
  Administered 2017-02-23: 1000 [IU] via INTRAVENOUS
  Administered 2017-02-23: 5000 [IU] via INTRAVENOUS

## 2017-02-23 MED ORDER — SORBITOL 70 % SOLN
30.0000 mL | Freq: Every day | Status: DC | PRN
  Filled 2017-02-23: qty 30

## 2017-02-23 MED ORDER — ONDANSETRON HCL 4 MG/2ML IJ SOLN
4.0000 mg | Freq: Once | INTRAMUSCULAR | Status: AC | PRN
  Administered 2017-02-23: 4 mg via INTRAVENOUS

## 2017-02-23 MED ORDER — ACETAMINOPHEN 650 MG RE SUPP: 325.0000 mg | RECTAL | Status: DC | PRN

## 2017-02-23 MED ORDER — NITROGLYCERIN IN D5W 200-5 MCG/ML-% IV SOLN: 5.0000 ug/min | INTRAVENOUS | Status: DC

## 2017-02-23 MED ORDER — METOPROLOL TARTRATE 5 MG/5ML IV SOLN: 2.0000 mg | INTRAVENOUS | Status: DC | PRN

## 2017-02-23 MED ORDER — PHENOL 1.4 % MT LIQD
1.0000 | OROMUCOSAL | Status: DC | PRN
  Filled 2017-02-23: qty 177

## 2017-02-23 MED ORDER — SODIUM CHLORIDE 0.9 % IV SOLN
INTRAVENOUS | Status: DC
  Administered 2017-02-23 – 2017-02-24 (×3): via INTRAVENOUS

## 2017-02-23 SURGICAL SUPPLY — 100 items
BAG COUNTER SPONGE EZ (MISCELLANEOUS) ×4 IMPLANT
BAG DECANTER FOR FLEXI CONT (MISCELLANEOUS) ×5 IMPLANT
BAG ISOLATATION DRAPE 20X20 ST (DRAPES) ×6 IMPLANT
BALLN ARMADA 14X40X80 (BALLOONS) ×5
BALLOON ARMADA 14X40X80 (BALLOONS) ×3 IMPLANT
BLADE SURG 15 STRL LF DISP TIS (BLADE) ×3 IMPLANT
BLADE SURG 15 STRL SS (BLADE) ×2
BLADE SURG SZ11 CARB STEEL (BLADE) ×5 IMPLANT
BOOT SUTURE AID YELLOW STND (SUTURE) ×10 IMPLANT
BRUSH SCRUB EZ  4% CHG (MISCELLANEOUS) ×2
BRUSH SCRUB EZ 4% CHG (MISCELLANEOUS) ×3 IMPLANT
CANISTER SUCT 1200ML W/VALVE (MISCELLANEOUS) ×5 IMPLANT
CATH BEACON 5 .035 65 KMP TIP (CATHETERS) ×5 IMPLANT
CATH EMBOLECTOMY 3X80 (CATHETERS) ×5 IMPLANT
CATH EMBOLECTOMY 4X80 (CATHETERS) ×5 IMPLANT
CATH PIG 70CM (CATHETERS) ×5 IMPLANT
CHLORAPREP W/TINT 26ML (MISCELLANEOUS) ×20 IMPLANT
COUNTER SPONGE BAG EZ (MISCELLANEOUS) ×1
COVER PROBE FLX POLY STRL (MISCELLANEOUS) ×10 IMPLANT
DECANTER SPIKE VIAL GLASS SM (MISCELLANEOUS) ×5 IMPLANT
DERMABOND ADVANCED (GAUZE/BANDAGES/DRESSINGS) ×2
DERMABOND ADVANCED .7 DNX12 (GAUZE/BANDAGES/DRESSINGS) ×3 IMPLANT
DEVICE PRESTO INFLATION (MISCELLANEOUS) ×10 IMPLANT
DEVICE SAFEGUARD 24CM (GAUZE/BANDAGES/DRESSINGS) ×5 IMPLANT
DEVICE STARCLOSE SE CLOSURE (Vascular Products) ×5 IMPLANT
DRAPE C-ARM XRAY 36X54 (DRAPES) ×5 IMPLANT
DRAPE INCISE IOBAN 66X45 STRL (DRAPES) ×5 IMPLANT
DRAPE ISOLATE BAG 20X20 STRL (DRAPES) ×4
DRESSING SURGICEL FIBRLLR 1X2 (HEMOSTASIS) ×6 IMPLANT
DRSG MEPITEL 4X7.2 (GAUZE/BANDAGES/DRESSINGS) ×5 IMPLANT
DRSG SURGICEL FIBRILLAR 1X2 (HEMOSTASIS) ×10
DRSG TEGADERM 4X4.75 (GAUZE/BANDAGES/DRESSINGS) ×5 IMPLANT
DRSG VAC ATS MED SENSATRAC (GAUZE/BANDAGES/DRESSINGS) ×5 IMPLANT
DURAPREP 26ML APPLICATOR (WOUND CARE) ×5 IMPLANT
ELECT CAUTERY BLADE 6.4 (BLADE) ×10 IMPLANT
ELECT CAUTERY BLADE TIP 2.5 (TIP) ×5
ELECT REM PT RETURN 9FT ADLT (ELECTROSURGICAL) ×5
ELECTRODE CAUTERY BLDE TIP 2.5 (TIP) ×3 IMPLANT
ELECTRODE REM PT RTRN 9FT ADLT (ELECTROSURGICAL) ×3 IMPLANT
GEL ULTRASOUND 20GR AQUASONIC (MISCELLANEOUS) IMPLANT
GLOVE BIO SURGEON STRL SZ 6.5 (GLOVE) ×16 IMPLANT
GLOVE BIO SURGEON STRL SZ7 (GLOVE) ×20 IMPLANT
GLOVE BIO SURGEONS STRL SZ 6.5 (GLOVE) ×4
GLOVE INDICATOR 7.5 STRL GRN (GLOVE) ×5 IMPLANT
GLOVE SURG SYN 8.0 (GLOVE) ×15 IMPLANT
GLOW 'N TELL TAPE ×4 IMPLANT
GOWN STRL REUS W/ TWL LRG LVL3 (GOWN DISPOSABLE) ×9 IMPLANT
GOWN STRL REUS W/ TWL XL LVL3 (GOWN DISPOSABLE) ×6 IMPLANT
GOWN STRL REUS W/TWL LRG LVL3 (GOWN DISPOSABLE) ×6
GOWN STRL REUS W/TWL XL LVL3 (GOWN DISPOSABLE) ×4
GUIDEWIRE ADV .018X180CM (WIRE) ×5 IMPLANT
IV NS 500ML (IV SOLUTION) ×2
IV NS 500ML BAXH (IV SOLUTION) ×3 IMPLANT
KIT RM TURNOVER STRD PROC AR (KITS) ×5 IMPLANT
LABEL OR SOLS (LABEL) ×5 IMPLANT
LEMAITRE 4F OVER THE WIRE CATHETER ×5 IMPLANT
LOOP RED MAXI  1X406MM (MISCELLANEOUS) ×6
LOOP VESSEL MAXI 1X406 RED (MISCELLANEOUS) ×9 IMPLANT
LOOP VESSEL MINI 0.8X406 BLUE (MISCELLANEOUS) ×3 IMPLANT
LOOPS BLUE MINI 0.8X406MM (MISCELLANEOUS) ×2
NEEDLE FILTER BLUNT 18X 1/2SAF (NEEDLE) ×2
NEEDLE FILTER BLUNT 18X1 1/2 (NEEDLE) ×3 IMPLANT
NS IRRIG 500ML POUR BTL (IV SOLUTION) ×5 IMPLANT
PACK ANGIOGRAPHY (CUSTOM PROCEDURE TRAY) ×5 IMPLANT
PACK BASIN MAJOR ARMC (MISCELLANEOUS) ×5 IMPLANT
PACK UNIVERSAL (MISCELLANEOUS) ×5 IMPLANT
PENCIL ELECTRO HAND CTR (MISCELLANEOUS) ×10 IMPLANT
SHEATH BRITE TIP 6FRX11 (SHEATH) ×5 IMPLANT
SHEATH BRITE TIP 8FRX11 (SHEATH) ×10 IMPLANT
SPONGE LAP 18X18 5 PK (GAUZE/BANDAGES/DRESSINGS) IMPLANT
SPONGE XRAY 4X4 16PLY STRL (MISCELLANEOUS) IMPLANT
STENT LIFESTAR 14X60 (Permanent Stent) ×5 IMPLANT
STENT LIFESTREAM 12X38X80 (Permanent Stent) ×5 IMPLANT
SUT GTX CV-5 TTC13 DBL (SUTURE) IMPLANT
SUT MNCRL+ 5-0 UNDYED PC-3 (SUTURE) ×6 IMPLANT
SUT MONOCRYL 5-0 (SUTURE) ×4
SUT PROLENE 5 0 RB 1 DA (SUTURE) ×20 IMPLANT
SUT PROLENE 6 0 BV (SUTURE) ×35 IMPLANT
SUT SILK 2 0 (SUTURE) ×2
SUT SILK 2 0 SH (SUTURE) ×5 IMPLANT
SUT SILK 2-0 18XBRD TIE 12 (SUTURE) ×3 IMPLANT
SUT SILK 3 0 (SUTURE) ×2
SUT SILK 3-0 18XBRD TIE 12 (SUTURE) ×3 IMPLANT
SUT SILK 4 0 (SUTURE) ×2
SUT SILK 4-0 18XBRD TIE 12 (SUTURE) ×3 IMPLANT
SUT VIC AB 2-0 CT1 (SUTURE) ×10 IMPLANT
SUT VIC AB 2-0 SH 27 (SUTURE) ×2
SUT VIC AB 2-0 SH 27XBRD (SUTURE) ×3 IMPLANT
SUT VIC AB 3-0 SH 27 (SUTURE) ×4
SUT VIC AB 3-0 SH 27X BRD (SUTURE) ×6 IMPLANT
SUT VICRYL+ 3-0 36IN CT-1 (SUTURE) ×10 IMPLANT
SYR 20CC LL (SYRINGE) ×5 IMPLANT
SYR 3ML LL SCALE MARK (SYRINGE) ×15 IMPLANT
SYR BULB IRRIG 60ML STRL (SYRINGE) ×5 IMPLANT
SYR TB 1ML 27GX1/2 LL (SYRINGE) ×5 IMPLANT
TAPE GLOW AND TELL VASCULAR (MISCELLANEOUS) ×5 IMPLANT
TAPE UMBIL 1/8X18 RADIOPA (MISCELLANEOUS) ×5 IMPLANT
TRAY FOLEY W/METER SILVER 16FR (SET/KITS/TRAYS/PACK) ×5 IMPLANT
WIRE MAGIC TORQUE 260C (WIRE) ×10 IMPLANT
WND VAC CANISTER 500ML (MISCELLANEOUS) ×5 IMPLANT

## 2017-02-23 NOTE — Progress Notes (Signed)
ANTICOAGULATION CONSULT NOTE - Initial Consult  Pharmacy Consult for Heparin  Indication: arterial embolism  No Known Allergies  Patient Measurements: Height: 5\' 10"  (177.8 cm) Weight: 154 lb 12.2 oz (70.2 kg) IBW/kg (Calculated) : 73 Heparin Dosing Weight: 70.3 kg   Vital Signs: Temp: 97.5 F (36.4 C) (12/26 1800) Temp Source: Axillary (12/26 1800) BP: 142/101 (12/26 1816) Pulse Rate: 71 (12/26 1800)  Labs: Recent Labs    02/23/17 1015  HGB 14.2  HCT 43.5  PLT 177  APTT 30  LABPROT 13.8  INR 1.07  CREATININE 1.71*  TROPONINI 0.14*    Estimated Creatinine Clearance: 46.2 mL/min (A) (by C-G formula based on SCr of 1.71 mg/dL (H)).   Medical History: Past Medical History:  Diagnosis Date  . Essential hypertension     Medications:  Medications Prior to Admission  Medication Sig Dispense Refill Last Dose  . amLODipine (NORVASC) 5 MG tablet Take 1 tablet (5 mg total) by mouth daily. 30 tablet 3 02/22/2017 at Unknown time  . apixaban (ELIQUIS) 5 MG TABS tablet Take 1 tab (5 mg ) bid (Patient taking differently: Take 5 mg by mouth 2 (two) times daily. Take 1 tab (5 mg ) bid) 60 tablet 3 02/22/2017 at Unknown time  . atorvastatin (LIPITOR) 40 MG tablet TAKE ONE TABLET BY MOUTH EVERY EVENING AT 6:00pm 90 tablet 0 02/22/2017 at Unknown time  . lisinopril (PRINIVIL,ZESTRIL) 20 MG tablet Take 2 tablets (40 mg total) by mouth daily. 180 tablet 0 02/22/2017 at Unknown time  . metoprolol tartrate (LOPRESSOR) 25 MG tablet TAKE ONE TABLET BY MOUTH 2 TIMES A DAY 180 tablet 0 02/22/2017 at Unknown time    Assessment: Pharmacy consulted to dose heparin in this 61 year old male S/P femoral thromboembolectomy ;  Pt was on Eliquis at home , last dose was on 12/25 PM. CrCl = 46.2 ml/min  Goal of Therapy:  Heparin level 0.3-0.7 units/ml Monitor platelets by anticoagulation protocol: Yes   Plan:  Will order Heparin 4200 units IV bolus X 1 followed by heparin gtt to start @ 1100  units/hr. Will check HL and aPTT 6 hrs from start of drip since pt was on Eliquis PTA,  If both levels are therapeutic then OK to use heparin to guide dosing.  Will monitor platelets daily.   Saagar Tortorella D 02/23/2017,6:57 PM

## 2017-02-23 NOTE — Op Note (Addendum)
[facility] Percutaneous Study/Intervention Procedural Note   Date of Surgery: 02/23/2017  Surgeon(s): Dr. Leotis Pain, MD and Dr. Hortencia Pilar, MD  Assistants:none  Pre-operative Diagnosis: PAD with acute ischemia RLE  Post-operative diagnosis: Same with apparent dissection and occlusion of the right iliac artery and stenosis of the left iliac artery  Procedure(s) Performed: 1. Ultrasound guidance for vascular access left femoral artery 2. Catheter placement into aorta from bilateral femoral approaches 3. Aortogram and bilateral ileofemoral angiogram 4. Fogarty embolectomy of the right iliac artery and Fogarty embolectomy of the right SFA and popliteal arteries as well as the right profunda femoris artery 5. Kissing balloon stent placement to bilateral common iliac arteries extending into the distal aorta with 12 mm diameter by 38 mm length Lifestream stents on each side  6.  Self-expanding stent placement in the left distal common iliac artery and external iliac artery for flow-limiting dissection with 14 mm diameter by 6 cm length life star stent 7. StarClose closure device left femoral artery  8.  4 compartment fasciotomies right calf  EBL: 100 cc  Fluoro Time: 6.4 minutes  Anesthesia: General  Indications: Patient is a 61 y.o.male with profound right lower extremity ischemia with motor and sensory dysfunction that started today. The patient has a CT scan showing essentially no flow from the right iliac artery origin down with a presumed embolic occlusion. The patient is brought in to the operating room for further evaluation and potential treatment. Risks and benefits are discussed and informed consent is obtained  Procedure: The patient was identified and appropriate procedural time out was performed. The patient was then placed supine on the table and prepped  and draped in the usual sterile fashion. With Dr. Delana Meyer working on the right and myself working on the left, we proceeded with the operation.  We began by performing a cutdown on the right femoral artery and dissecting out the right common femoral artery, profunda femoris artery, superficial femoral artery and several branches.  One vessel was pulseless but not heavily diseased.  The patient was systemically heparinized.  Initially, we began by trying to pass Fogarty embolectomy balloons.  Proximally we were able to pass the balloon to about 25 cm and initially a 3 and a 4 Fogarty were used to perform iliofemoral embolectomies.  There was some thrombus that was removed but the inflow remained very poor.  We could get an 018 wire to pass and try to perform an over-the-wire embolectomy but again inflow remained very poor.  We then used a 3 Fogarty embolectomy balloon and made 2 passes down the SFA all the way to about 65-70 cm to know that we were in the tibial vessels.  Although there was only a scant amount of thrombus returned if any, there did return some back bleeding.  A single pass with a 3 Fogarty embolectomy balloon was made down the right profunda femoris artery to about 20 cm with no significant thrombus removed and sluggish backbleeding.  At this point, with the inflow remaining very poor we elected to access the left side for further evaluation.  Ultrasound was used to visualize a patent left common femoral artery and this was accessed without difficulty with a Seldinger needle and a 6 French sheath was placed.  A wire would not easily pass and imaging performed through the left femoral sheath showed what appeared to be a dissection in the left external and common iliac arteries.  Tediously, with a Glidewire and a Kumpe  catheter we started by getting access up and over the aortic bifurcation and going down into the right femoral artery confirming that we were in the true lumen.  This gave [place] wire and  catheter access which we parked at the aortic bifurcation from the right side.  Using the Glidewire and the Kumpe catheter we were able to cross the lesion in the iliac artery and gain intraluminal flow in the aorta and confirmed this with imaging.  Several images were performed and this was clearly an aortic dissection that extended into both iliac arteries with occlusion on the right and greater than 50% stenosis on the left.  The dissection extended well down into the external iliac artery on the left.  Dr. Delana Meyer was unable to cross the occlusion with a Kumpe catheter and a Glidewire and confirm intraluminal flow in the aorta.  We now had intraluminal access to the aorta Magic torque wires from both femoral sheaths up into the aorta.  Due to the generous size of the iliac arteries and aorta, we had upsized to 8 [place] sheaths.  A 12 mm diameter by 38 mm length covered stents were then selected.  These were Lifestream stents and replaced bilaterally proximal extent going about 4-5 mm into the aorta and the distal extent being in the common iliac artery bilaterally.  These were inflated to 8 atm and inflated simultaneously in a kissing balloon fashion.  This resulted in restoration of a pulse with good blood flow in the right side with less than 10% residual stenosis.  We did post dilate the proximal portions of the stents up to 14 mm and brisk flow was seen.  On the left, there was residual greater than 50% stenosis and dissection beyond the stented area going down into the external iliac artery and a second stent was required.  A 12 mm diameter by 6 cm length life star self-expanding stent was deployed from the distal common iliac artery down into the mid external iliac this was postdilated with a 12 mm balloon with excellent angiographic completion result and less than 10% residual stenosis in either iliac system.  We elected to terminate the procedure. The left-sided sheath was removed and StarClose closure  device was deployed in the left femoral artery with excellent hemostatic result.  The transverse arteriotomy in the right femoral artery was then closed with 5 interrupted 6-0 Prolene sutures.  Flushing maneuvers were performed and good pulsatile flow was seen.  The groin was then closed with 2-0 Vicryl, 2 layers of 3-0 Vicryl, and a 4-0 Monocryl.  Medial and lateral longitudinal Incisions were performed for fasciotomies.  Dissection down to the fascia was performed with electrocautery and fasciotomies were performed in the usual fashion with Metzenbaum scissors both medially and laterally to expose all 4 compartments and decompress them.  Negative pressure dressings were then placed medially and laterally on the calf.  The patient was taken to the recovery room in stable condition having tolerated the procedure well.  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  This created occlusion of the proximal right common iliac artery and greater than 50% stenosis of the left common and external iliac arteries.  The femoral vessels appear to be reasonably clean with minimal disease and no stenosis or occlusion.    Disposition: Patient was taken to the recovery room in stable condition having tolerated the procedure well.  Complications: None  Leotis Pain 02/23/2017 3:40 PM   This note was created with  Dragon [employer] transcription system. Any errors in dictation are purely unintentional.

## 2017-02-23 NOTE — ED Notes (Signed)
Patient transported to CT 

## 2017-02-23 NOTE — Anesthesia Procedure Notes (Signed)
Central Venous Catheter Insertion Performed by: attending Patient location: OR. Preanesthetic checklist: patient identified, IV checked, site marked, risks and benefits discussed, surgical consent, monitors and equipment checked, pre-op evaluation, timeout performed and anesthesia consent Position: supine Patient sedated Hand hygiene performed  and maximum sterile barriers used  Central line was placed.Triple lumen Procedure performed using ultrasound guided technique. Attempts: 1 Following insertion, dressing applied and line sutured. Post procedure assessment: blood return through all ports and free fluid flow  Patient tolerated the procedure well with no immediate complications. Additional procedure comments: Performed by Gilda Crease, MD. Sterile technique used.

## 2017-02-23 NOTE — Anesthesia Procedure Notes (Signed)
Arterial Line Insertion Performed by: Berdine Addison, MD, anesthesiologist  Patient location: OR. Preanesthetic checklist: patient identified, IV checked, site marked, risks and benefits discussed, surgical consent, monitors and equipment checked, pre-op evaluation, timeout performed and anesthesia consent Lidocaine 1% used for infiltration and patient sedated Right, radial was placed Catheter size: 20 G Hand hygiene performed  and maximum sterile barriers used  Allen's test indicative of satisfactory collateral circulation Attempts: 3 Procedure performed without using ultrasound guided technique. Following insertion, dressing applied. Post procedure assessment: normal  Patient tolerated the procedure well with no immediate complications.

## 2017-02-23 NOTE — Consult Note (Signed)
Pt admitted to [facility] Unit 12/26 s/p catheter placement into aorta bilateral femoral artery approach, fogarty embolectomy of the right common and external iliac artery as well as the profunda femoris and superficial femoral arteries, open transluminal angioplasty and stent placement right common iliac artery; "kissing balloon" technique, percutaneous transluminal angioplasty and stent placement left common iliac artery; "kissing balloon" technique, percutaneous transluminal angioplasty and stent placement left external iliac artery, open cutdown right common femoral artery, abdominal aortogram with iliofemoral angiography bilaterally, and 4 compartment fasciotomies right calf.  PCCM consulted for medical management overnight. Pt did have postop bleeding at right fasciotomy site Vascular Surgeon assessed site and changed dressing hgb pending and heparin gtt placed on hold until 01:00 am.  Pt is alert and oriented, follows commands, nsr on cardiac monitor, right lower extremity dressing intact with small amount of dried blood present, bp stable PCCM will assist with management.  Sonda Rumble, AGNP  Pulmonary/Critical Care Pager [phone] (please enter 7 digits) PCCM Consult Pager [phone] (please enter 7 digits)

## 2017-02-23 NOTE — Progress Notes (Signed)
RN spoke with Dr. Gilda Crease and made MD aware that patient is bleeding substantially from right lower leg faciatomy site.  Dr. Gilda Crease gave order to stop heparin drip and stated that he would come see patient and assess bleeding and not to reinforce site at this time that he would handle it when he arrives.

## 2017-02-23 NOTE — ED Notes (Signed)
Pt reports his last meal was last night.

## 2017-02-23 NOTE — Anesthesia Procedure Notes (Signed)
Procedure Name: Intubation Date/Time: 02/23/2017 12:21 PM Performed by: Jonna Clark, CRNA Pre-anesthesia Checklist: Patient identified, Patient being monitored, Timeout performed, Emergency Drugs available and Suction available Patient Re-evaluated:Patient Re-evaluated prior to induction Oxygen Delivery Method: Circle system utilized Preoxygenation: Pre-oxygenation with 100% oxygen Induction Type: IV induction Ventilation: Mask ventilation without difficulty Laryngoscope Size: Mac and 3 Grade View: Grade I Tube type: Oral Tube size: 7.5 mm Number of attempts: 1 Placement Confirmation: ETT inserted through vocal cords under direct vision,  positive ETCO2 and breath sounds checked- equal and bilateral Secured at: 21 cm Tube secured with: Tape Dental Injury: Teeth and Oropharynx as per pre-operative assessment

## 2017-02-23 NOTE — Anesthesia Preprocedure Evaluation (Addendum)
Anesthesia Evaluation  Patient identified by MRN, date of birth, ID band Patient awake    Reviewed: Allergy & Precautions, NPO status , Patient's Chart, lab work & pertinent test results, reviewed documented beta blocker date and time   Airway Mallampati: II  TM Distance: >3 FB     Dental  (+) Chipped, Missing, Poor Dentition, Dental Advisory Given   Pulmonary Current Smoker,           Cardiovascular hypertension, Pt. on medications and Pt. on home beta blockers      Neuro/Psych    GI/Hepatic   Endo/Other    Renal/GU      Musculoskeletal   Abdominal   Peds  Hematology   Anesthesia Other Findings Emergency. Lbbb. Has recovered from stroke in 06/2016. Walks without a cain.EF 45. No cardiac symptoms. No chest pain. Will start A-line. Troponins elevated.Allens test ok.  Reproductive/Obstetrics                           Anesthesia Physical Anesthesia Plan  ASA: III  Anesthesia Plan: General   Post-op Pain Management:    Induction: Intravenous  PONV Risk Score and Plan:   Airway Management Planned: Oral ETT  Additional Equipment:   Intra-op Plan:   Post-operative Plan:   Informed Consent: I have reviewed the patients History and Physical, chart, labs and discussed the procedure including the risks, benefits and alternatives for the proposed anesthesia with the patient or authorized representative who has indicated his/her understanding and acceptance.     Plan Discussed with: CRNA  Anesthesia Plan Comments:         Anesthesia Quick Evaluation

## 2017-02-23 NOTE — Op Note (Addendum)
OPERATIVE NOTE   PROCEDURE: 1. Insertion of triple-lumen central venous catheter right IJ approach.  PRE-OPERATIVE DIAGNOSIS: right leg ischemia, poor venous access  POST-OPERATIVE DIAGNOSIS: same  CO-SURGEON: Renford Dills M.D. And Annice Needy, MD  ANESTHESIA: 1% lidocaine local infiltration  ESTIMATED BLOOD LOSS: Minimal cc  INDICATIONS:   Mitchell Dixon is a 61 y.o. male who presents with acute ischemia of the right lower extremity.  He has poor intravenous access so prior to initiating the surgery I will place a central line.  DESCRIPTION: After obtaining full informed written consent, the patient was positioned supine. The right neck and chest wall was prepped and draped in a sterile fashion. Ultrasound was placed in a sterile sleeve. Ultrasound was utilized to identify the right IJ vein which is noted to be echolucent and compressible indicating patency. Images recorded for the permanent record. Under real-time visualization a Seldinger needle is inserted into the vein and the guidewires advanced without difficulty. Small counterincision was made at the wire insertion site. Dilators passed over the wire and the triple-lumen catheter is fed without difficulty.  All 3 lm aspirate and flush easily and are packed with heparin saline. Catheter secured to the skin of the right neck with 2-0 silk. A sterile dressing is applied with Biopatch.  COMPLICATIONS: None  CONDITION: Unchanged  Renford Dills, M.D. [facility] Office:  [phone]   02/23/2017, 8:34 PM

## 2017-02-23 NOTE — ED Provider Notes (Signed)
[facility] Emergency Department Provider Note       Time seen: ----------------------------------------- 10:28 AM on 02/23/2017 -----------------------------------------   I have reviewed the triage vital signs and the nursing notes.  HISTORY   Chief Complaint Abdominal Pain    HPI Mitchell Dixon is a 61 y.o. male with a history of hypertension, hyperlipidemia and CVA who presents to the ED for acute onset abdominal pain.  Patient states he had some pain in his chest and then acutely began having lower abdominal pain at 830.  Patient states he cannot move either 1 of his legs right now, worse in the right leg.  He is complaining of some shortness of breath and abdominal pain currently.  He can also come planes of nausea.  He has never had this problem before.  Past Medical History:  Diagnosis Date  . Essential hypertension     Patient Active Problem List   Diagnosis Date Noted  . Hypertension 07/20/2016  . Hyperlipidemia 07/20/2016  . CVA (cerebral vascular accident) (HCC) 07/03/2016    Past Surgical History:  Procedure Laterality Date  . HERNIA REPAIR      Allergies Patient has no known allergies.  Social History Social History   Tobacco Use  . Smoking status: Current Every Day Smoker    Packs/day: 0.25    Years: 30.00    Pack years: 7.50    Types: Cigarettes  . Smokeless tobacco: Never Used  Substance Use Topics  . Alcohol use: No  . Drug use: No    Review of Systems Constitutional: Negative for fever. Eyes: Negative for vision changes ENT:  Negative for congestion, sore throat Cardiovascular: Positive for chest pain Respiratory: Positive for shortness of breath Gastrointestinal: Positive for abdominal pain Genitourinary: Negative for dysuria. Musculoskeletal: Positive for leg pain and weakness Skin: Negative for rash. Neurological: Positive for weakness in both legs  All systems negative/normal/unremarkable  except as stated in the HPI  ____________________________________________   PHYSICAL EXAM:  VITAL SIGNS: ED Triage Vitals  Enc Vitals Group     BP 02/23/17 1020 (!) 134/113     Pulse Rate 02/23/17 1020 (!) 59     Resp 02/23/17 1020 18     Temp --      Temp src --      SpO2 02/23/17 1020 99 %     Weight 02/23/17 1009 155 lb (70.3 kg)     Height 02/23/17 1009 5\' 10"  (1.778 m)     Head Circumference --      Peak Flow --      Pain Score 02/23/17 1009 7     Pain Loc --      Pain Edu? --      Excl. in GC? --     Constitutional: Alert and oriented.  Mild to moderate distress Eyes: Conjunctivae are normal. Normal extraocular movements. ENT   Head: Normocephalic and atraumatic.   Nose: No congestion/rhinnorhea.   Mouth/Throat: Mucous membranes are moist.   Neck: No stridor. Cardiovascular: Normal rate, regular rhythm.  No obvious murmur, there are no pulses in the right leg.  No femoral pulses palpated or obtained by Doppler in the right leg.  Left leg has good dopplerable pulses.  Both extremities are cool to touch.  Patient states they are asensate Respiratory: Mild tachypnea with clear breath sounds. Gastrointestinal: Soft with nonfocal tenderness.  Normal bowel sounds. Musculoskeletal: Limited range of motion, cool with pulse deficits in the right leg Neurologic:  Normal speech and  language. No gross focal neurologic deficits are appreciated.  Patient cannot move his legs, states they are a sensate. Skin: Skin is cool, pallor is noted Psychiatric: Anxious mood and affect ____________________________________________  EKG: Interpreted by me.  Sinus rhythm the rate of 69 bpm, normal PR interval,, left bundle branch block, long QT  ____________________________________________  ED COURSE:  As part of my medical decision making, I reviewed the following data within the electronic MEDICAL RECORD NUMBER History obtained from family if available, nursing notes, old chart and  ekg, as well as notes from prior ED visits. Patient presented for abdominal pain with sudden onset inability to move his legs and likely acute limb ischemia, we will assess with labs and imaging as indicated at this time. Clinical Course as of Feb 23 1129  Wed Feb 23, 2017  1051 Patient discussed with vascular surgery  [JW]    Clinical Course User Index [JW] Emily FilbertWilliams, Nena Hampe E, MD   Procedures ____________________________________________   LABS (pertinent positives/negatives)  Labs Reviewed  CBC WITH DIFFERENTIAL/PLATELET - Abnormal; Notable for the following components:      Result Value   Lymphs Abs 4.1 (*)    All other components within normal limits  PROTIME-INR  APTT  COMPREHENSIVE METABOLIC PANEL  TROPONIN I  LACTIC ACID, PLASMA  LACTIC ACID, PLASMA  TYPE AND SCREEN   CRITICAL CARE Performed by: Emily FilbertWilliams, Aedan Geimer E   Total critical care time: 30 minutes  Critical care time was exclusive of separately billable procedures and treating other patients.  Critical care was necessary to treat or prevent imminent or life-threatening deterioration.  Critical care was time spent personally by me on the following activities: development of treatment plan with patient and/or surrogate as well as nursing, discussions with consultants, evaluation of patient's response to treatment, examination of patient, obtaining history from patient or surrogate, ordering and performing treatments and interventions, ordering and review of laboratory studies, ordering and review of radiographic studies, pulse oximetry and re-evaluation of patient's condition.  RADIOLOGY Images were viewed by me  CT aorta with femoral runoff Reveals poor blood flow from the right common femoral artery distally.  There is no flow in the right leg.  Also right kidney appears ischemic. ____________________________________________  DIFFERENTIAL DIAGNOSIS   Embolism, acute ischemia, dissection, AAA  FINAL  ASSESSMENT AND PLAN  Acute limb ischemia, aortic thrombosis   Plan: Patient had presented for acute onset of abdominal pain with inability to move his legs and particularly right leg pain. Patient's labs are overall reassuring. Patient's imaging however revealed significant thrombosis and possible embolism involving the aorta and most of the right leg.  I discussed with vascular surgery early on about my suspicions prior to the CT scan.  He has been evaluated by Dr. Gilda CreaseSchnier and will go to the OR for thrombectomy.  He is in critical condition at this time.   Emily FilbertWilliams, Eliya Bubar E, MD   Note: This note was generated in part or whole with voice recognition software. Voice recognition is usually quite accurate but there are transcription errors that can and very often do occur. I apologize for any typographical errors that were not detected and corrected.     Emily FilbertWilliams, Navy Rothschild E, MD 02/23/17 [phone]

## 2017-02-23 NOTE — Anesthesia Post-op Follow-up Note (Signed)
Anesthesia QCDR form completed.        

## 2017-02-23 NOTE — Transfer of Care (Signed)
Immediate Anesthesia Transfer of Care Note  Patient: Jereth Barnish  Procedure(s) Performed: EMBOLECTOMY (Right Groin) FASCIECTOMY (Right Leg Lower)  Patient Location: PACU  Anesthesia Type:General  Level of Consciousness: sedated and responds to stimulation  Airway & Oxygen Therapy: Patient Spontanous Breathing and Patient connected to face mask oxygen  Post-op Assessment: Report given to RN and Post -op Vital signs reviewed and stable  Post vital signs: Reviewed and stable  Last Vitals:  Vitals:   02/23/17 1117 02/23/17 1634  BP: (!) 157/114 126/84  Pulse: 70 70  Resp: 16 12  SpO2: 98% 98%    Last Pain:  Vitals:   02/23/17 1112  PainSc: 7          Complications: No apparent anesthesia complications

## 2017-02-23 NOTE — Anesthesia Postprocedure Evaluation (Signed)
Anesthesia Post Note  Patient: Mitchell Dixon  Procedure(s) Performed: EMBOLECTOMY (Right Groin) FASCIECTOMY (Right Leg Lower)  Patient location during evaluation: PACU Anesthesia Type: General Level of consciousness: awake and alert Pain management: pain level controlled Vital Signs Assessment: post-procedure vital signs reviewed and stable Respiratory status: spontaneous breathing, nonlabored ventilation, respiratory function stable and patient connected to nasal cannula oxygen Cardiovascular status: blood pressure returned to baseline and stable Postop Assessment: no apparent nausea or vomiting Anesthetic complications: no     Last Vitals:  Vitals:   02/23/17 2000 02/23/17 2100  BP: (!) 127/95 98/87  Pulse: 86   Resp: 11 14  Temp: 36.4 C   SpO2: 96%     Last Pain:  Vitals:   02/23/17 2000  TempSrc: Axillary  PainSc: 5                  Kaydie Petsch S

## 2017-02-23 NOTE — Progress Notes (Signed)
Called to see patient for continued bleeding from the fasciotomy site.  Dressing taken down lateral fasciotomy site is inspected medial fasciotomy site does not have any overt bleeding.  Inspection of the lateral site does not reveal any focal bleeding site appears to be more diffuse.  Fibril R was placed in the bed of the wound and the wound was redressed with gauze ABD pads Covan and an Ace.  Hemoglobin will be checked at 10:00 as well as in the morning.  Heparin will be held for now and restarted later this evening

## 2017-02-23 NOTE — H&P (Signed)
[facility] Admission History & Physical  MRN : [id number]  Mitchell Dixon is a 61 y.o. (June 25, 1955) male who presents with chief complaint of  Chief Complaint  Patient presents with  . Abdominal Pain  .  History of Present Illness: The patient presented to the emergency room earlier this morning with combination of abdominal pain and leg pain.  At the present time he is denying abdominal pain.  He continues to have excruciating 10 out of 10 leg pain.  He also states he can no longer move his leg.  He is never experienced anything like this in the past.  The onset was abrupt.  He denies prior similar episodes.  CT angiogram demonstrates occlusion at the origin of the right common iliac.  There is no evidence of flow through the entire right leg.  There is no reconstitution of the internal iliac on the right or profunda on the right there is no distal reconstitution of any popliteal or tibial vessels.  Current Facility-Administered Medications  Medication Dose Route Frequency Provider Last Rate Last Dose  . ceFAZolin (ANCEF) IVPB 2g/100 mL premix  2 g Intravenous On Call to OR Schnier, Latina Craver, MD      . diphenhydrAMINE (BENADRYL) injection 25 mg  25 mg Intravenous Once Schnier, Latina Craver, MD       Current Outpatient Medications  Medication Sig Dispense Refill  . amLODipine (NORVASC) 5 MG tablet Take 1 tablet (5 mg total) by mouth daily. 30 tablet 3  . apixaban (ELIQUIS) 5 MG TABS tablet Take 1 tab (5 mg ) bid (Patient taking differently: Take 5 mg by mouth 2 (two) times daily. Take 1 tab (5 mg ) bid) 60 tablet 3  . atorvastatin (LIPITOR) 40 MG tablet TAKE ONE TABLET BY MOUTH EVERY EVENING AT 6:00pm 90 tablet 0  . lisinopril (PRINIVIL,ZESTRIL) 20 MG tablet Take 2 tablets (40 mg total) by mouth daily. 180 tablet 0  . metoprolol tartrate (LOPRESSOR) 25 MG tablet TAKE ONE TABLET BY MOUTH 2 TIMES A DAY 180 tablet 0    Past Medical History:  Diagnosis Date   . Essential hypertension     Past Surgical History:  Procedure Laterality Date  . HERNIA REPAIR      Social History Social History   Tobacco Use  . Smoking status: Current Every Day Smoker    Packs/day: 0.25    Years: 30.00    Pack years: 7.50    Types: Cigarettes  . Smokeless tobacco: Never Used  Substance Use Topics  . Alcohol use: No  . Drug use: No    Family History Family History  Problem Relation Age of Onset  . Dementia Mother   . Hypertension Mother   . Pneumonia Father   . Hypertension Sister   No family history of bleeding/clotting disorders, porphyria or autoimmune disease   No Known Allergies   REVIEW OF SYSTEMS (Negative unless checked)  Constitutional: [] Weight loss  [] Fever  [] Chills Cardiac: [] Chest pain   [] Chest pressure   [] Palpitations   [] Shortness of breath when laying flat   [] Shortness of breath at rest   [] Shortness of breath with exertion. Vascular:  [] Pain in legs with walking   [x] Pain in legs at rest   [] Pain in legs when laying flat   [] Claudication   [] Pain in feet when walking  [] Pain in feet at rest  [] Pain in feet when laying flat   [] History of DVT   [] Phlebitis   [] Swelling in  legs   [] Varicose veins   [] Non-healing ulcers Pulmonary:   [] Uses home oxygen   [] Productive cough   [] Hemoptysis   [] Wheeze  [] COPD   [] Asthma Neurologic:  [] Dizziness  [] Blackouts   [] Seizures   [] History of stroke   [] History of TIA  [] Aphasia   [] Temporary blindness   [] Dysphagia   [] Weakness or numbness in arms   [] Weakness or numbness in legs Musculoskeletal:  [] Arthritis   [] Joint swelling   [] Joint pain   [] Low back pain Hematologic:  [] Easy bruising  [] Easy bleeding   [] Hypercoagulable state   [] Anemic  [] Hepatitis Gastrointestinal:  [] Blood in stool   [] Vomiting blood  [] Gastroesophageal reflux/heartburn   [] Difficulty swallowing. Genitourinary:  [] Chronic kidney disease   [] Difficult urination  [] Frequent urination  [] Burning with urination    [] Blood in urine Skin:  [] Rashes   [] Ulcers   [] Wounds Psychological:  [] History of anxiety   []  History of major depression.  Physical Examination  Vitals:   02/23/17 1009 02/23/17 1020 02/23/17 1117  BP:  (!) 134/113 (!) 157/114  Pulse:  (!) 59 70  Resp:  18 16  SpO2:  99% 98%  Weight: 70.3 kg (155 lb)    Height: 5\' 10"  (1.778 m)     Body mass index is 22.24 kg/m. Gen: WD/WN, NAD Head: [place]/AT, No temporalis wasting. Prominent temp pulse not noted. Ear/Nose/Throat: Hearing grossly intact, nares w/o erythema or drainage, oropharynx w/o Erythema/Exudate,  Eyes: Conjunctiva clear, sclera non-icteric Neck: Trachea midline.  No JVD.  Pulmonary:  Good air movement, respirations not labored, no use of accessory muscles.  Cardiac: RRR, normal S1, S2. Vascular: Right leg is cold there is no motor function no flexion or extension of the knee ankle or toes. Vessel Right Left  Radial Palpable Palpable  Ulnar Palpable Palpable  Brachial Palpable Palpable  Carotid Palpable, without bruit Palpable, without bruit  Aorta Not palpable N/A  Femoral  not palpable Palpable  Popliteal  not palpable Palpable  PT  not palpable Palpable  DP  not palpable Palpable   Gastrointestinal: soft, non-tender/non-distended. No guarding/reflex.  Musculoskeletal: M/S 5/5 throughout upper extremities and left no motor function of the right lower extremity.  Right lower extremity with ischemic changes.  No deformity or atrophy.  Neurologic: Sensation grossly intact in extremities.  Symmetrical.  Speech is fluent. Motor exam as listed above. Psychiatric: Judgment intact, Mood & affect appropriate for pt's clinical situation. Dermatologic: No rashes or ulcers noted.  No cellulitis or open wounds. Lymph : No Cervical, Axillary, or Inguinal lymphadenopathy.     CBC Lab Results  Component Value Date   WBC 8.4 02/23/2017   HGB 14.2 02/23/2017   HCT 43.5 02/23/2017   MCV 99.1 02/23/2017   PLT 177 02/23/2017     BMET    Component Value Date/Time   NA 140 02/23/2017 1015   NA 141 02/17/2017 1909   K 3.6 02/23/2017 1015   CL 105 02/23/2017 1015   CO2 19 (L) 02/23/2017 1015   GLUCOSE 144 (H) 02/23/2017 1015   BUN 10 02/23/2017 1015   BUN 12 02/17/2017 1909   CREATININE 1.71 (H) 02/23/2017 1015   CALCIUM 9.2 02/23/2017 1015   GFRNONAA 42 (L) 02/23/2017 1015   GFRAA 49 (L) 02/23/2017 1015   Estimated Creatinine Clearance: 46.3 mL/min (A) (by C-G formula based on SCr of 1.71 mg/dL (H)).  COAG Lab Results  Component Value Date   INR 1.07 02/23/2017    Radiology No results found.  Assessment/Plan 1.  Ischemic right leg The patient has a profoundly ischemic right lower extremity with motor sensory compromise.  I have recommended that we proceed with surgery to revascularize his right lower extremity.  This may entail thromboembolectomy in combination with a femoral femoral bypass and/or a femoropopliteal bypass.  The fasciotomies were also discussed with the patient.  The risks and benefits as well as alternative therapies and the high likelihood for limb loss were all discussed with the patient all questions were answered patient wishes for [place] to proceed and asks that we do everything to save his leg.  Patient will be taken to the operating room emergently as soon as the surgical suite is available  2.  History of cardiac thrombus  uncertain as to whether this was ever completely evaluated.  [facility] stating that he was going to be referred to cardiology.  3.  Hypertension Continue antihypertensive medications as already ordered, these medications have been reviewed and there are no changes at this time.    Levora Dredge, MD  02/23/2017 11:42 AM

## 2017-02-23 NOTE — Progress Notes (Signed)
Dr. Gilda Crease at bedside and assessed right lower extremity bleeding.  MD stated he would place surgiseal and to order hemoglobin for 2200.

## 2017-02-23 NOTE — Op Note (Signed)
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  Catheter placement into aorta bilateral femoral artery approach  2.  Fogarty embolectomy of the right common and external iliac artery as well as the profunda femoris and superficial femoral arteries  3.  Open transluminal angioplasty and stent placement right common iliac artery; "kissing balloon" technique  4.  Percutaneous transluminal angioplasty and stent placement left common iliac artery; "kissing balloon" technique  5.  Percutaneous transluminal angioplasty and stent placement left external iliac artery  6.  Ultrasound guided access left common femoral artery             7.  Open cutdown right common femoral artery             8.  Abdominal aortogram with iliofemoral angiography bilaterally             9.  4 compartment fasciotomies right calf  Anesthesia: General  Sheath: 8 French sheaths bilaterally  Contrast: Approximately 60 cc  Fluoroscopy Time: 6.4 minutes  Indications: Patient presented to the emergency room with profound right lower extremity ischemia he had motor and sensory deficit of the right lower extremity.  CT scan demonstrated absence of flow from the common iliac down to the right ankle.  Patient has a history of embolic stroke several months ago.  He has documented thrombus within his heart.  He ran out of his Eliquis proximally 4 days ago and so has not been anticoagulated.  Procedure:  Donell BeersRickey Nelson Pattersonis a 61 y.o. male who was identified and appropriate procedural time out was performed.  General anesthesia was obtained and a central line was placed which will be dictated as a separate note.  The patient was  then positioned supine on the table and prepped and draped in the usual sterile fashion.  With Dr. Wyn Quakerew working on the patient's left side and myself on the patient's right side we began by exploring the right groin.  Vertical incision was then made in the right groin and the dissection carried down to expose the anterior wall of the common femoral artery.  A short segment of common femoral artery was then dissected circumferentially and vessel loops were then placed.  5000 units of heparin was given and allowed to circulate.  A transverse arteriotomy was then made and the common femoral artery was open.  Both a #3 and a #4 Fogarty were used to perform Fogarty embolectomy beginning in the iliacs first.  Although some forward bleeding was noted clearly this did not represent full inflow.  The Fogarty catheter itself was hanging up at approximately 20-25 cm suggesting that it was not fully into the aorta.  Attention was then turned to the superficial femoral and the profunda femoris artery which were each treated with Fogarty embolectomy using a #3 Fogarty.  2 passes were made down the SFA to the full length of the Fogarty catheter.  Although some back bleeding was noted it was sluggish.  This was true of the profunda femoris and there was no significant thrombus retrieved.  Given that the inflow was still not adequate we elected to bring in fluoroscopy and the C-arm was prepped and advanced into the field.  Ultrasound was used to evaluate the left common femoral artery.  It  was echolucent and pulsatile indicating it is patent .  An ultrasound image was acquired for the permanent record.  A micropuncture needle was used to access the left common femoral artery under direct ultrasound guidance.  The microwire was then advanced under fluoroscopic guidance and an 6 Fr sheath was placed later this was upsized to an 8 [place] sheath.  The Kumpe catheter was then advanced up the right side and positioned at the level of  the aortic bifurcation and an AP image of the aorta was obtained.  This demonstrated that the Kumpe catheter was in the false lumen of dissection this was later verified with injections from the left side where the dissection flap was clearly visualized.  Ultimately using a combination of the Kumpe catheter pigtail catheter and Glidewire as well as Magic torque wire we were able to negotiate a catheter and wire from both the right and left groins into the true lumen of the abdominal aorta is was verified by angiography.  Magic torque wires were then advanced up both the right and left side.  An additional 2000 Units of heparin was given and allowed to circulate for proximally 4 minutes.  Magnified images of the aortic bifurcation were then made using hand injection contrast from the femoral sheaths. After appropriate sizing a 12 x 38 life Stream stent was selected for the right and a 12 x 38 Lifestream stent was selected for the left. There were then advanced and positioned just above the aortic bifurcation. Insufflation for full expansion of the stents was performed simultaneously. Follow-up imaging was then performed and which demonstrated flow through both iliacs.  No further dissection flap was noted on the right however on the left there was an extensive dissection flap that extended through the common iliac and into the external iliac.  This was then treated by deploying a 14 mm by 60 life star stent across this dissection flap into the external iliac and post dilating it with a 12 mm balloon.    The pigtail catheter was then introduced up the right and bolus injection of contrast was used to perform final imaging of the distal aortic reconstruction.  Follow-up imaging demonstrated resolution of the dissection within the aortic distal aorta iliac system.  Oblique views were then obtained of the left groin Star close device is deployed without difficulty.  The right groin was then irrigated and the  femoral arteriotomy closed using interrupted 6-0 Prolene.  Once hemostasis had been achieved the wound was closed in multiple layers using 2-0 Vicryl followed by 3-0 Vicryl followed by 4-0 Monocryl subcuticular.  Attention is then turned to the distal right limb medial incision is made carried down through the soft tissues to expose the fascia in both the posterior and anterior fascia is opened with Metzenbaum scissors.  Subsequently the leg is rotated and a linear incision is made over the lateral muscle groups with the dissection is carried down through the soft tissues and then the anterior and lateral compartments are opened with Metzenbaum scissors.  Initially a VAC dressing was applied but this was later removed secondary to bleeding from the fasciotomy sites and the wounds were packed and wrapped with gauze and then Ace wraps.  The patient tolerated the procedure well but remains in guarded condition.   Findings:   Aortogram:  The abdominal aorta is opacified with contrast.  There appears to be a dissection which extends into the left common iliac and left external iliac.  It causes an occlusion  at the origin of the right common iliac as well as occludes the right renal.  Following placement of the iliac stents there is now wide patency with less than 10% residual stenosis with rapid flow through the aortic bifurcation bilaterally.  Summary:  Successful reconstruction of the distal aorta and bilateral iliac arteries  Disposition: Patient was taken to the recovery room in stable condition having tolerated the procedure well.  Earl Lites Schnier 02/23/2017,7:19 PM

## 2017-02-23 NOTE — Progress Notes (Addendum)
RN called to inform pharmacy that heparin drip will be held until 01:00 per Dr. Gilda Crease due to bleeding at fasciotomy site. I retimed aPTT and heparin level to 07:00 (approximately 6 hours after infusion resumes) per protocol.  Nathan A. Frederick, [place].D., BCPS Clinical Pharmacist 02/23/2017 20:26   12/27 0100 per RN heparin restart deferred until 0400. Levels rescheduled for 1000  Fulton Reek, PharmD, BCPS  02/24/17 2:59 AM

## 2017-02-23 NOTE — ED Triage Notes (Signed)
[facility].  Arrives from home with c/o SOB and abdominal pain.  Onset of symptoms this morning at 0830.  Patietn c/o nausea.  No emesis.  States pain initially started in chest, then went into abdomen and then to his back.  VS WNL.  Patient AAOx3.  skin cool and dry

## 2017-02-24 ENCOUNTER — Encounter: Payer: Self-pay | Admitting: Vascular Surgery

## 2017-02-24 LAB — PROCALCITONIN: PROCALCITONIN: 3.29 ng/mL

## 2017-02-24 LAB — PROTEIN / CREATININE RATIO, URINE
CREATININE, URINE: 134 mg/dL
Protein Creatinine Ratio: 0.6 mg/mg{Cre} — ABNORMAL HIGH (ref 0.00–0.15)
TOTAL PROTEIN, URINE: 81 mg/dL

## 2017-02-24 LAB — CBC
HCT: 29.1 % — ABNORMAL LOW (ref 40.0–52.0)
HEMOGLOBIN: 9.4 g/dL — AB (ref 13.0–18.0)
MCH: 31.3 pg (ref 26.0–34.0)
MCHC: 32.3 g/dL (ref 32.0–36.0)
MCV: 96.9 fL (ref 80.0–100.0)
PLATELETS: 166 10*3/uL (ref 150–440)
RBC: 3 MIL/uL — ABNORMAL LOW (ref 4.40–5.90)
RDW: 13.7 % (ref 11.5–14.5)
WBC: 19.9 10*3/uL — ABNORMAL HIGH (ref 3.8–10.6)

## 2017-02-24 LAB — HEPARIN LEVEL (UNFRACTIONATED)
HEPARIN UNFRACTIONATED: 1.23 [IU]/mL — AB (ref 0.30–0.70)
Heparin Unfractionated: 1.01 IU/mL — ABNORMAL HIGH (ref 0.30–0.70)

## 2017-02-24 LAB — BASIC METABOLIC PANEL
ANION GAP: 12 (ref 5–15)
BUN: 25 mg/dL — ABNORMAL HIGH (ref 6–20)
CALCIUM: 7.3 mg/dL — AB (ref 8.9–10.3)
CO2: 16 mmol/L — AB (ref 22–32)
CREATININE: 3.29 mg/dL — AB (ref 0.61–1.24)
Chloride: 108 mmol/L (ref 101–111)
GFR, EST AFRICAN AMERICAN: 22 mL/min — AB (ref >60)
GFR, EST NON AFRICAN AMERICAN: 19 mL/min — AB (ref >60)
GLUCOSE: 178 mg/dL — AB (ref 65–99)
Potassium: 5 mmol/L (ref 3.5–5.1)
Sodium: 136 mmol/L (ref 135–145)

## 2017-02-24 LAB — URINALYSIS, ROUTINE W REFLEX MICROSCOPIC
BACTERIA UA: NONE SEEN
BILIRUBIN URINE: NEGATIVE
Glucose, UA: 50 mg/dL — AB
KETONES UR: 5 mg/dL — AB
Leukocytes, UA: NEGATIVE
Nitrite: NEGATIVE
PH: 5 (ref 5.0–8.0)
PROTEIN: 30 mg/dL — AB
SQUAMOUS EPITHELIAL / LPF: NONE SEEN
Specific Gravity, Urine: 1.039 — ABNORMAL HIGH (ref 1.005–1.030)

## 2017-02-24 LAB — GLUCOSE, CAPILLARY
Glucose-Capillary: 111 mg/dL — ABNORMAL HIGH (ref 65–99)
Glucose-Capillary: 101 mg/dL — ABNORMAL HIGH (ref 65–99)

## 2017-02-24 LAB — HEMOGLOBIN
Hemoglobin: 8.3 g/dL — ABNORMAL LOW (ref 13.0–18.0)
Hemoglobin: 7.1 g/dL — ABNORMAL LOW (ref 13.0–18.0)

## 2017-02-24 LAB — APTT: aPTT: >160 seconds (ref 24–36)

## 2017-02-24 MED ORDER — INFLUENZA VAC SPLIT QUAD 0.5 ML IM SUSY: 0.5000 mL | PREFILLED_SYRINGE | INTRAMUSCULAR | Status: DC

## 2017-02-24 MED ORDER — SODIUM CHLORIDE 0.9 % IV SOLN
Freq: Once | INTRAVENOUS | Status: AC
  Administered 2017-02-24: 19:00:00 via INTRAVENOUS

## 2017-02-24 MED ORDER — FAMOTIDINE IN NACL 20-0.9 MG/50ML-% IV SOLN: 20.0000 mg | INTRAVENOUS | Status: DC

## 2017-02-24 MED ORDER — HEPARIN (PORCINE) IN NACL 100-0.45 UNIT/ML-% IJ SOLN
600.0000 [IU]/h | INTRAMUSCULAR | Status: DC
  Administered 2017-02-25: 700 [IU]/h via INTRAVENOUS

## 2017-02-24 MED ORDER — FUROSEMIDE 10 MG/ML IJ SOLN
10.0000 mg | Freq: Once | INTRAMUSCULAR | Status: AC
  Administered 2017-02-24: 10 mg via INTRAVENOUS
  Filled 2017-02-24: qty 2

## 2017-02-24 MED ORDER — HEPARIN (PORCINE) IN NACL 100-0.45 UNIT/ML-% IJ SOLN
900.0000 [IU]/h | INTRAMUSCULAR | Status: DC
  Administered 2017-02-24 (×2): 900 [IU]/h via INTRAVENOUS
  Filled 2017-02-24: qty 250

## 2017-02-24 MED ORDER — HYDRALAZINE HCL 20 MG/ML IJ SOLN: 10.0000 mg | INTRAMUSCULAR | Status: DC | PRN

## 2017-02-24 MED ORDER — PNEUMOCOCCAL VAC POLYVALENT 25 MCG/0.5ML IJ INJ: 0.5000 mL | INJECTION | INTRAMUSCULAR | Status: DC

## 2017-02-24 MED ORDER — METOPROLOL TARTRATE 5 MG/5ML IV SOLN: 2.5000 mg | INTRAVENOUS | Status: DC | PRN

## 2017-02-24 NOTE — Consult Note (Signed)
Date: 02/24/2017                  Patient Name:  Mitchell Dixon  MRN: [id number]  DOB: [birth year]  Age / Sex: 61 y.o., male         PCP: Patient, No Pcp Per                 Service Requesting Consult: Vascular surgery/ Schnier, Latina Craver, MD                 Reason for Consult: ARF            History of Present Illness: Patient is a 61 y.o. male with medical problems of HTN, hld, STROKE, Cardiac thrombus, who was admitted to [facility] on 02/23/2017 for evaluation of acute onset of abdominal pain.  Patient presented to the emergency room yesterday morning for acute onset of chest pain and then later lower abdominal pain.  He also complained of excruciating leg pain and inability to move his legs. He underwent a CT angiogram that demonstrated occlusion of the origin of right common iliac.  Patient was admitted for acute limb ischemia. Patient then underwent emergent revascularization of the right lower extremity along with aortic stent placement.  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  Nonocclusive thrombus of proximal SMA and celiac trunk.  Thrombus in the left external iliac artery and left common femoral artery.  Nephrology consult is requested for acute renal failure.  Patient's baseline creatinine is 1.18 from December 20, GFR 67/78 Upon admission, creatinine had increased to 1.71.  Unfortunately however, his creatinine has further increased to 3.29 today.  Patient was noted to have elevated lactic acid of 5.7.  Urine output is recorded at 760 cc over the last 24 hours He Currently has a Foley catheter.  His appetite is poor.  He does not feel like eating.  He has not had any vomiting but he does feel nauseous.  Since his revascularization, he has some improved feeling in his legs.  However, he is still unable to move his legs.    Medications: Outpatient  medications: Medications Prior to Admission  Medication Sig Dispense Refill Last Dose  . amLODipine (NORVASC) 5 MG tablet Take 1 tablet (5 mg total) by mouth daily. 30 tablet 3 02/22/2017 at Unknown time  . apixaban (ELIQUIS) 5 MG TABS tablet Take 1 tab (5 mg ) bid (Patient taking differently: Take 5 mg by mouth 2 (two) times daily. Take 1 tab (5 mg ) bid) 60 tablet 3 02/22/2017 at Unknown time  . atorvastatin (LIPITOR) 40 MG tablet TAKE ONE TABLET BY MOUTH EVERY EVENING AT 6:00pm 90 tablet 0 02/22/2017 at Unknown time  . lisinopril (PRINIVIL,ZESTRIL) 20 MG tablet Take 2 tablets (40 mg total) by mouth daily. 180 tablet 0 02/22/2017 at Unknown time  . metoprolol tartrate (LOPRESSOR) 25 MG tablet TAKE ONE TABLET BY MOUTH 2 TIMES A DAY 180 tablet 0 02/22/2017 at Unknown time    Current medications: Current Facility-Administered Medications  Medication Dose Route Frequency Provider Last Rate Last Dose  . 0.9 %  sodium chloride infusion   Intravenous Continuous Schnier, Latina Craver, MD 75 mL/hr at 02/24/17 0148    . acetaminophen (TYLENOL) tablet 325-650 mg  325-650 mg Oral Q4H PRN Schnier, Latina Craver, MD       Or  . acetaminophen (  TYLENOL) suppository 325-650 mg  325-650 mg Rectal Q4H PRN Schnier, Latina CraverGregory G, MD      . alum & mag hydroxide-simeth (MAALOX/MYLANTA) 200-200-20 MG/5ML suspension 15-30 mL  15-30 mL Oral Q2H PRN Schnier, Latina CraverGregory G, MD      . amLODipine (NORVASC) tablet 5 mg  5 mg Oral Daily Schnier, Latina CraverGregory G, MD   5 mg at 02/24/17 0904  . atorvastatin (LIPITOR) tablet 20 mg  20 mg Oral Q2000 Schnier, Latina CraverGregory G, MD   20 mg at 02/23/17 2128  . ceFAZolin (ANCEF) 1 g in dextrose 5 % 50 mL injection   Intravenous Q8H Schnier, Latina CraverGregory G, MD      . docusate sodium (COLACE) capsule 100 mg  100 mg Oral Daily Schnier, Latina CraverGregory G, MD   100 mg at 02/24/17 0904  . famotidine (PEPCID) IVPB 20 mg premix  20 mg Intravenous Q12H Schnier, Latina CraverGregory G, MD 100 mL/hr at 02/24/17 0907 20 mg at 02/24/17 0907  .  fentaNYL (SUBLIMAZE) injection 25 mcg  25 mcg Intravenous Q5 min PRN Berdine Addisonhomas, Mathai, MD      . guaiFENesin-dextromethorphan [facility](ROBITUSSIN DM) 100-10 MG/5ML syrup 15 mL  15 mL Oral Q4H PRN Schnier, Latina CraverGregory G, MD      . heparin ADULT infusion 100 units/mL (25000 units/25650mL sodium chloride 0.45%)  1,100 Units/hr Intravenous Continuous Schnier, Latina CraverGregory G, MD 11 mL/hr at 02/24/17 0500 1,100 Units/hr at 02/24/17 0500  . hydrALAZINE (APRESOLINE) injection 5 mg  5 mg Intravenous Q20 Min PRN Schnier, Latina CraverGregory G, MD      . Melene Muller[START ON 02/25/2017] Influenza vac split quadrivalent PF (FLUARIX) injection 0.5 mL  0.5 mL Intramuscular Tomorrow-1000 Merwyn KatosSimonds, David B, MD      . labetalol (NORMODYNE,TRANDATE) injection 10 mg  10 mg Intravenous Q10 min PRN Schnier, Latina CraverGregory G, MD      . magnesium sulfate IVPB 2 g 50 mL  2 g Intravenous Daily PRN Schnier, Latina CraverGregory G, MD      . metoprolol tartrate (LOPRESSOR) injection 2-5 mg  2-5 mg Intravenous Q2H PRN Schnier, Latina CraverGregory G, MD      . metoprolol tartrate (LOPRESSOR) tablet 25 mg  25 mg Oral BID Renford DillsSchnier, Gregory G, MD   25 mg at 02/24/17 0904  . morphine 2 MG/ML injection 2-5 mg  2-5 mg Intravenous Q1H PRN Schnier, Latina CraverGregory G, MD      . nitroGLYCERIN 50 mg in dextrose 5 % 250 mL (0.2 mg/mL) infusion  5-250 mcg/min Intravenous Titrated Schnier, Latina CraverGregory G, MD      . ondansetron [facility](ZOFRAN) injection 4 mg  4 mg Intravenous Q6H PRN Schnier, Latina CraverGregory G, MD   4 mg at 02/24/17 0609  . oxyCODONE-acetaminophen (PERCOCET/ROXICET) 5-325 MG per tablet 1-2 tablet  1-2 tablet Oral Q4H PRN Schnier, Latina CraverGregory G, MD      . phenol (CHLORASEPTIC) mouth spray 1 spray  1 spray Mouth/Throat PRN Schnier, Latina CraverGregory G, MD      . Melene Muller[START ON 02/25/2017] pneumococcal 23 valent vaccine (PNU-IMMUNE) injection 0.5 mL  0.5 mL Intramuscular Tomorrow-1000 Merwyn KatosSimonds, David B, MD      . potassium chloride SA (K-DUR,KLOR-CON) CR tablet 20-40 mEq  20-40 mEq Oral Daily PRN Schnier, Latina CraverGregory G, MD      . senna-docusate (Senokot-S)  tablet 1 tablet  1 tablet Oral QHS PRN Schnier, Latina CraverGregory G, MD      . sorbitol 70 % solution 30 mL  30 mL Oral Daily PRN Schnier, Latina CraverGregory G, MD  Allergies: No Known Allergies    Past Medical History: Past Medical History:  Diagnosis Date  . Essential hypertension      Past Surgical History: Past Surgical History:  Procedure Laterality Date  . HERNIA REPAIR       Family History: Family History  Problem Relation Age of Onset  . Dementia Mother   . Hypertension Mother   . Pneumonia Father   . Hypertension Sister      Social History: Social History   Socioeconomic History  . Marital status: Single    Spouse name: Not on file  . Number of children: Not on file  . Years of education: Not on file  . Highest education level: Not on file  Social Needs  . Financial resource strain: Not on file  . Food insecurity - worry: Not on file  . Food insecurity - inability: Not on file  . Transportation needs - medical: Not on file  . Transportation needs - non-medical: Not on file  Occupational History  . Not on file  Tobacco Use  . Smoking status: Current Every Day Smoker    Packs/day: 0.25    Years: 30.00    Pack years: 7.50    Types: Cigarettes  . Smokeless tobacco: Never Used  Substance and Sexual Activity  . Alcohol use: No  . Drug use: No  . Sexual activity: No  Other Topics Concern  . Not on file  Social History Narrative  . Not on file     Review of Systems: Gen: No fevers or chills HEENT: No vision problems, no hearing issues CV: No chest pain or shortness of breath at present Resp: No cough or sputum production GI: Some nausea but no vomiting.  No blood in the stool GU : No problems with voiding reported prior to admission.  No blood in the urine MS: No joint pains Derm:  No rashes Psych: No complaints Heme: Patient had clot in the heart diagnosis some Neuro: Unable to move his legs Endocrine.  No complaints  Vital Signs: Blood  pressure 130/70, pulse (!) 106, temperature (!) 97.5 F (36.4 C), temperature source Axillary, resp. rate 12, height 5\' 10"  (1.778 m), weight 70.2 kg (154 lb 12.2 oz), SpO2 93 %.   Intake/Output Summary (Last 24 hours) at 02/24/2017 0926 Last data filed at 02/24/2017 0600 Gross per 24 hour  Intake 2909.5 ml  Output 1010 ml  Net 1899.5 ml    Weight trends: Filed Weights   02/23/17 1009 02/23/17 1824  Weight: 70.3 kg (155 lb) 70.2 kg (154 lb 12.2 oz)    Physical Exam: General:  No acute distress, laying in the bed  HEENT  anicteric, moist oral mucous membranes  Neck:  Supple  Lungs:  Normal breathing effort, clear to auscultation  Heart::  Regular rhythm, no rub  Abdomen:  Soft, nontender  Extremities:  Right lower extremity bandage, toes are cool to touch  Neurologic:  Alert, oriented, unable to move legs  Skin:  No acute rashes     Foley:  In place       Lab results: Basic Metabolic Panel: Recent Labs  Lab 02/17/17 1909 02/23/17 1015 02/24/17 0415  NA 141 140 136  K 3.6 3.6 5.0  CL 98 105 108  CO2 26 19* 16*  GLUCOSE 98 144* 178*  BUN 12 10 25*  CREATININE 1.18 1.71* 3.29*  CALCIUM 9.7 9.2 7.3*    Liver Function Tests: Recent Labs  Lab 02/23/17 1015  AST 38  ALT 26  ALKPHOS 110  BILITOT 0.6  PROT 7.8  ALBUMIN 4.4   No results for input(s): LIPASE, AMYLASE in the last 168 hours. No results for input(s): AMMONIA in the last 168 hours.  CBC: Recent Labs  Lab 02/23/17 1015 02/23/17 2246 02/24/17 0415  WBC 8.4  --  19.9*  NEUTROABS 3.7  --   --   HGB 14.2 10.4* 9.4*  HCT 43.5 31.7* 29.1*  MCV 99.1  --  96.9  PLT 177  --  166    Cardiac Enzymes: Recent Labs  Lab 02/23/17 1015  TROPONINI 0.14*    BNP: Invalid input(s): POCBNP  CBG: Recent Labs  Lab 02/23/17 1758  GLUCAP 93    Microbiology: Recent Results (from the past 720 hour(s))  MRSA PCR Screening     Status: None   Collection Time: 02/23/17  5:53 PM  Result Value Ref  Range Status   MRSA by PCR NEGATIVE NEGATIVE Final    Comment:        The GeneXpert MRSA Assay (FDA approved for NASAL specimens only), is one component of a comprehensive MRSA colonization surveillance program. It is not intended to diagnose MRSA infection nor to guide or monitor treatment for MRSA infections. Performed at [facility], [address] [place], [place] [address]      Coagulation Studies: Recent Labs    02/23/17 1015  LABPROT 13.8  INR 1.07    Urinalysis: No results for input(s): COLORURINE, LABSPEC, PHURINE, GLUCOSEU, HGBUR, BILIRUBINUR, KETONESUR, PROTEINUR, UROBILINOGEN, NITRITE, LEUKOCYTESUR in the last 72 hours.  Invalid input(s): APPERANCEUR      Imaging: Dg Chest 1 View  Result Date: 02/23/2017 CLINICAL DATA:  Central line placement. EXAM: CHEST 1 VIEW COMPARISON:  None. FINDINGS: Right internal jugular approach central venous catheter terminates at the cavoatrial junction. Cardiomediastinal silhouette is normal. Mediastinal contours appear intact. There is no evidence of focal airspace consolidation, pleural effusion or pneumothorax. Osseous structures are without acute abnormality. Soft tissues are grossly normal. IMPRESSION: Right internal jugular approach central venous catheter terminates at the cavoatrial junction. No evidence of pneumothorax. Electronically Signed   By: Ted Mcalpine M.D.   On: 02/23/2017 17:08   Ct Angio Aortobifemoral W And/or Wo Contrast  Addendum Date: 02/23/2017   ADDENDUM REPORT: 02/23/2017 13:04 ADDENDUM: Pockets of gas in the anterior abdomen on sequence 4, 64 probably represent intraluminal small bowel rather than free air. Small pockets of gas in the central abdomen could also represent gas within very decompressed loops of small bowel. No evidence for portal venous gas. Electronically Signed   By: Richarda Overlie M.D.   On: 02/23/2017 13:04   Result Date: 02/23/2017 CLINICAL DATA:  61 year old with a  cold right leg. Evaluate for arterial embolism. Abdominal pain. EXAM: CT ANGIOGRAPHY OF ABDOMINAL AORTA WITH ILIOFEMORAL RUNOFF TECHNIQUE: Multidetector CT imaging of the abdomen, pelvis and lower extremities was performed using the standard protocol during bolus administration of intravenous contrast. Multiplanar CT image reconstructions and MIPs were obtained to evaluate the vascular anatomy. CONTRAST:  ISOVUE-370 IOPAMIDOL (ISOVUE-370) INJECTION 76% COMPARISON:  None. FINDINGS: VASCULAR Aorta: The distal descending thoracic aorta is mildly enlarged measuring up to 3.7 cm with irregular mural thrombus along the anterior wall. Irregular mural thrombus in the proximal abdominal aorta at the level of the SMA. Findings most likely related to acute thrombus but difficult to exclude an underlying dissection in the aorta. Thrombus along the right side of the distal abdominal aorta. Aorta at the level  of the celiac trunk measures roughly 3.2 cm. Infrarenal abdominal aorta measures 2.6 cm. Images at the aortic root are unremarkable but limited evaluation of the thoracic aorta. Celiac: Small filling defects and irregularity involving the celiac trunk most likely represents thrombus but difficult to exclude dissection flaps in this area. There is flow in the left gastric artery, splenic artery and common hepatic artery. Distal splenic artery is not visualized but the spleen is also very small. SMA: Thrombus or dissection flap involving the origin of the SMA originating from the right side of the abdominal aorta. The SMA is patent. Main branch vessels have flow. 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 There is no blood flow in the right renal artery or right kidney. Irregular filling defects in the proximal left renal artery compatible with thrombus. High-grade narrowing of the mid left renal artery due to thrombus. IMA: Origin of the IMA is thrombosed but there is distal reconstitution of the IMA a few cm  beyond the origin. RIGHT Lower Extremity Inflow: Thrombus along the right side of the distal abdominal aorta extends into the right common iliac artery. There is no flow in the right common or right external iliac artery. Question minimal reconstitution in the right internal iliac arteries. Outflow: Minimal reconstitution of the right common femoral artery from the inferior epigastric artery. Small amount of flow in the right profunda femoral artery. The proximal right SFA is thrombosed. Minimal reconstitution of mid right SFA. Distal SFA is occluded. No flow in the right popliteal artery. Runoff: No flow in the right runoff vessels. LEFT Lower Extremity Inflow: Irregular mural thrombus involving the left common iliac artery but the vessel is patent. Cannot exclude nonocclusive thrombus or dissection involving the left common iliac artery. Proximal left internal iliac artery is thrombosed with some distal reconstitution. Left external iliac artery has a dissection flap or wall based thrombus. Outflow: Dissection flap or stringy thrombus extending into the left common femoral artery. Left profunda femoral artery is patent. Left SFA is patent. Left popliteal artery is patent. Runoff: Three-vessel runoff. Cannot exclude distal thrombus in the peroneal artery at the ankle. Veins: No obvious venous abnormality within the limitations of this arterial phase study. Review of the MIP images confirms the above findings. NON-VASCULAR Lower chest: Centrilobular emphysema at the lung bases. No large pleural effusions. Hepatobiliary: No acute abnormality to the liver or gallbladder. Pancreas: No significant enhancement in the pancreas on this phase of imaging. There may be fluid along the posterior aspect of the pancreatic body and tail region. Spleen: Spleen is small without any significant enhancement. Cannot exclude distal occlusion of the splenic artery. Adrenals/Urinary Tract: Adrenal glands are within normal limits. No flow  in the right kidney. Enhancement in the left kidney but there is a striated enhancement pattern along the periphery of the kidneys. Negative for hydronephrosis. Urinary bladder is decompressed. Stomach/Bowel: Normal appendix. Irregular small foci of gas in the central mesentery area. An example of this is on sequence 4, image 76 just anterior to the aorta. These small gas pockets connect with a larger pocket in the upper abdomen on sequence 4, 66. There is also concern for an abnormal gas pocket in the right anterior abdomen on image 64 just posterior to the colon. Findings are concerning for pneumatosis and possibly free air. Cannot exclude pneumatosis in small bowel loops in the lower abdomen and pelvis. There is no free air near the hemidiaphragms or liver. Mild dilatation of proximal small bowel loops. No evidence  for bowel obstruction. Gas at the gastric fundus is probably intraluminal but has an unusual configuration. Lymphatic: No significant lymph node enlargement in the abdomen or pelvis. Reproductive: Prostate is unremarkable. Other: Trace free fluid in the pelvis. Musculoskeletal: No suspicious bone findings. IMPRESSION: VASCULAR Multifocal arterial emboli. Arterial emboli throughout the right lower extremity starting at the right common iliac artery. No significant flow to the right lower extremity with exception of minimal flow in the right profunda femoral arteries. 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 Thrombus in the left renal artery with a critical stenosis in the mid left renal artery. Nonocclusive thrombus involving proximal SMA and celiac trunk. Proximal occlusion of the IMA. Thrombus in the left external iliac artery and left common femoral artery. No significant arterial occlusion in the left lower extremity. Thrombus in the left internal iliac artery. Ectasia of the thoracoabdominal aorta with irregular mural thrombus particularly along the anterior right side of the aorta. Findings  could be secondary to arterial emboli/thrombus but difficult to exclude an underlying aortic dissection. NON-VASCULAR Unusual appearance of the bowel structures. Difficult to exclude pneumatosis and gas within the mesentery. No large pocket of free air. No arterial flow to the right kidney. Striated enhancement pattern in the left kidney compatible with arterial emboli in the left renal artery. Limited evaluation of the splenic arterial flow and enhancement. Critical Value/emergent results were called by telephone at the time of interpretation on 02/23/2017 at 11:27 am to Dr. Daryel November , who verbally acknowledged these results. The results were also discussed with Dr. Gilda Crease of Vascular surgery at 12:00 p.m. on 02/23/2017. Electronically Signed: By: Richarda Overlie M.D. On: 02/23/2017 12:22   Dg C-arm 61-120 Min-no Report  Result Date: 02/23/2017 Fluoroscopy was utilized by the requesting physician.  No radiographic interpretation.      Assessment & Plan: Pt is a 61 y.o. African-American  male with hypertension, hyperlipidemia, stroke, cardiac thrombus, was admitted on 02/23/2017 with acute limb ischemia and underwent revascularization.   1.  Acute renal failure, nonoliguric 2.  Metabolic acidosis  Multiple factors including IV contrast exposure, multifocal arterial emboli including complete occlusion of the right renal artery and critical stenosis of the left mid left renal artery due to thrombus At present, patient is getting IV normal saline His serum creatinine and potassium are increasing rapidly Discussed with patient that it is expected he will need hemodialysis this admission He has a cousin who takes dialysis and he also knows a couple of other people who take dialysis, therefore he is somewhat familiar with it. There is no acute indication for dialysis at present but due to rapid increase in serum creatinine and potassium, he is expected to need dialysis in the near future Obtain  urinalysis, urine P/C ratio, ANA, ANCA, Hepatitis B and C studies, SPEP, UPEP, HIV (patient verbally consented)

## 2017-02-24 NOTE — Progress Notes (Signed)
CRITICAL VALUE ALERT  Critical Value:  WBC 19.9  Date & Time Notied:  02/24/17 0500  Provider Notified: Annabelle Harman, NP  Orders Received/Actions taken: Order to draw Procalcitonin.

## 2017-02-24 NOTE — Progress Notes (Signed)
CRITICAL VALUE ALERT  Critical Value:  Creatinine 3.29  Date & Time Notied:  02/24/17 9604  Provider Notified: Annabelle Harman, NP  Orders Received/Actions taken: Nephrology Consult

## 2017-02-24 NOTE — Progress Notes (Signed)
Mitchell Dixon  Daily Progress Note   Subjective  - 1 Day Post-Op  Patient is awake and alert this morning he states his abdominal pain is gone and his right leg pain is gone.  However, he also states he cannot feel or move his right leg.  Objective Vitals:   02/24/17 1030 02/24/17 1100 02/24/17 1130 02/24/17 1200  BP:  (!) 136/91  (!) 139/102  Pulse:      Resp: 18 15 19 14   Temp:      TempSrc:      SpO2:      Weight:      Height:        Intake/Output Summary (Last 24 hours) at 02/24/2017 1239 Last data filed at 02/24/2017 1217 Gross per 24 hour  Intake 3439.5 ml  Output 1050 ml  Net 2389.5 ml    PULM  Normal effort , no use of accessory muscles CV  No JVD, RRR Abd      No distended, nontender VASC  right leg is warm with good Doppler signals fasciotomy sites are dressed there is some blood staining of the lateral site  Laboratory CBC    Component Value Date/Time   WBC 19.9 (H) 02/24/2017 0415   HGB 8.3 (L) 02/24/2017 1033   HGB 13.6 02/17/2017 1909   HCT 29.1 (L) 02/24/2017 0415   HCT 40.7 02/17/2017 1909   PLT 166 02/24/2017 0415   PLT 246 02/17/2017 1909    BMET    Component Value Date/Time   NA 136 02/24/2017 0415   NA 141 02/17/2017 1909   K 5.0 02/24/2017 0415   CL 108 02/24/2017 0415   CO2 16 (L) 02/24/2017 0415   GLUCOSE 178 (H) 02/24/2017 0415   BUN 25 (H) 02/24/2017 0415   BUN 12 02/17/2017 1909   CREATININE 3.29 (H) 02/24/2017 0415   CALCIUM 7.3 (L) 02/24/2017 0415   GFRNONAA 19 (L) 02/24/2017 0415   GFRAA 22 (L) 02/24/2017 0415    Assessment/Planning: POD #1 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  Bilateral fasciotomies performed as well.   I am disappointed that he is not had more significant recovery of his neuromuscular function.  At this time his right leg remains well perfused and hopefully as time passes he will regain significantly more function.  Of note  his renal function is now deteriorated significantly nephrology has seen him and we will make plans for a dialysis catheter placement.  He remains critically ill and therefore no immediate re-intervention at this time.    Levora Dredge  02/24/2017, 12:39 PM

## 2017-02-24 NOTE — Progress Notes (Signed)
At beginnining of shift in nurse to nurse hand off pt found with paper and cloth absorbant pad soaked with bright red blood. Pts right leg had bleed through wrapped fasciotomy sites on the right leg. Surgeon called and made aware. Order to stop heparin until 1 am. MD arrived to bedside and rewrapped fasciotomy site. Order to recheck H&H at 10 pm. Will continue to monitor closely.

## 2017-02-24 NOTE — Progress Notes (Signed)
On midnight assessment pt had slightly bled through newly wrapped fasciotomy site. MD called with h&h result of Hgb 10.4 and Hct 31.7 MD verbal to check morning labs for recheck and reinforce dressing. Will continue to monitor.

## 2017-02-24 NOTE — Progress Notes (Signed)
ANTICOAGULATION CONSULT NOTE - Initial Consult  Pharmacy Consult for Heparin  Indication: arterial embolism  No Known Allergies  Patient Measurements: Height: 5\' 10"  (177.8 cm) Weight: 154 lb 12.2 oz (70.2 kg) IBW/kg (Calculated) : 73 Heparin Dosing Weight: 70.3 kg   Vital Signs: Temp: 97.5 F (36.4 C) (12/27 0830) Temp Source: Axillary (12/27 0830) BP: 139/102 (12/27 1200) Pulse Rate: 125 (12/27 1000)  Labs: Recent Labs    02/23/17 1015 02/23/17 2246 02/24/17 0415 02/24/17 1000 02/24/17 1033  HGB 14.2 10.4* 9.4*  --  8.3*  HCT 43.5 31.7* 29.1*  --   --   PLT 177  --  166  --   --   APTT 30  --   --  >160*  --   LABPROT 13.8  --   --   --   --   INR 1.07  --   --   --   --   HEPARINUNFRC  --   --   --  1.01*  --   CREATININE 1.71*  --  3.29*  --   --   TROPONINI 0.14*  --   --   --   --     Estimated Creatinine Clearance: 24 mL/min (A) (by C-G formula based on SCr of 3.29 mg/dL (H)).   Medical History: Past Medical History:  Diagnosis Date  . Essential hypertension     Medications:  Medications Prior to Admission  Medication Sig Dispense Refill Last Dose  . amLODipine (NORVASC) 5 MG tablet Take 1 tablet (5 mg total) by mouth daily. 30 tablet 3 02/22/2017 at Unknown time  . apixaban (ELIQUIS) 5 MG TABS tablet Take 1 tab (5 mg ) bid (Patient taking differently: Take 5 mg by mouth 2 (two) times daily. Take 1 tab (5 mg ) bid) 60 tablet 3 02/22/2017 at Unknown time  . atorvastatin (LIPITOR) 40 MG tablet TAKE ONE TABLET BY MOUTH EVERY EVENING AT 6:00pm 90 tablet 0 02/22/2017 at Unknown time  . lisinopril (PRINIVIL,ZESTRIL) 20 MG tablet Take 2 tablets (40 mg total) by mouth daily. 180 tablet 0 02/22/2017 at Unknown time  . metoprolol tartrate (LOPRESSOR) 25 MG tablet TAKE ONE TABLET BY MOUTH 2 TIMES A DAY 180 tablet 0 02/22/2017 at Unknown time    Assessment: Pharmacy consulted to dose heparin in this 61 year old male s/p femoral thromboembolectomy. Pt was on  Eliquis at home.  Goal of Therapy:  Heparin level 0.3-0.7 units/ml Monitor platelets by anticoagulation protocol: Yes   Plan:  Baseline heparin level not ordered but both heparin level and aPTT are elevated. Will hold heparin infusion for 1 hour and resume infusion at 900 units/hr. Will check HL and aPTT 8 hours after resuming infusion. May be able to dose by HL only if levels continue to correlate, but that is difficult to determine at this time without a baseline HL.   Luisa Hart D 02/24/2017,12:22 PM

## 2017-02-24 NOTE — Care Management (Signed)
Patient is followed by [facility] and Medication Management.

## 2017-02-24 NOTE — Progress Notes (Signed)
CRITICAL VALUE ALERT  Critical Value:  APTT Date & Time Notied:  12/27 1200 pm   Provider Notified: Luisa Hart, [facility] Orders Received/Actions taken: Hold Heprin for 1 hour

## 2017-02-24 NOTE — Progress Notes (Signed)
Dr. Gilda Crease present and gave order to continue to reinforce dressing to right lower leg with ABD pads and kerlix over the ACE wrap and to not remove ace wrap.  MD stated if the ABD pads are soaked with blood to the point you can squeeze blood from them then to notify him.

## 2017-02-24 NOTE — Progress Notes (Signed)
ANTICOAGULATION CONSULT NOTE - Initial Consult  Pharmacy Consult for Heparin  Indication: arterial embolism  No Known Allergies  Patient Measurements: Height: 5\' 10"  (177.8 cm) Weight: 154 lb 12.2 oz (70.2 kg) IBW/kg (Calculated) : 73 Heparin Dosing Weight: 70.3 kg   Vital Signs: Temp: 98.6 F (37 C) (12/27 2230) Temp Source: Axillary (12/27 2230) BP: 142/80 (12/27 2210) Pulse Rate: 93 (12/27 2230)  Labs: Recent Labs    02/23/17 1015 02/23/17 2246 02/24/17 0415 02/24/17 1000 02/24/17 1033 02/24/17 1720 02/24/17 2114  HGB 14.2 10.4* 9.4*  --  8.3* 7.1*  --   HCT 43.5 31.7* 29.1*  --   --   --   --   PLT 177  --  166  --   --   --   --   APTT 30  --   --  >160*  --   --  >160*  LABPROT 13.8  --   --   --   --   --   --   INR 1.07  --   --   --   --   --   --   HEPARINUNFRC  --   --   --  1.01*  --   --  1.23*  CREATININE 1.71*  --  3.29*  --   --   --   --   TROPONINI 0.14*  --   --   --   --   --   --     Estimated Creatinine Clearance: 24 mL/min (A) (by C-G formula based on SCr of 3.29 mg/dL (H)).   Medical History: Past Medical History:  Diagnosis Date  . Essential hypertension     Medications:  Medications Prior to Admission  Medication Sig Dispense Refill Last Dose  . amLODipine (NORVASC) 5 MG tablet Take 1 tablet (5 mg total) by mouth daily. 30 tablet 3 02/22/2017 at Unknown time  . apixaban (ELIQUIS) 5 MG TABS tablet Take 1 tab (5 mg ) bid (Patient taking differently: Take 5 mg by mouth 2 (two) times daily. Take 1 tab (5 mg ) bid) 60 tablet 3 02/22/2017 at Unknown time  . atorvastatin (LIPITOR) 40 MG tablet TAKE ONE TABLET BY MOUTH EVERY EVENING AT 6:00pm 90 tablet 0 02/22/2017 at Unknown time  . lisinopril (PRINIVIL,ZESTRIL) 20 MG tablet Take 2 tablets (40 mg total) by mouth daily. 180 tablet 0 02/22/2017 at Unknown time  . metoprolol tartrate (LOPRESSOR) 25 MG tablet TAKE ONE TABLET BY MOUTH 2 TIMES A DAY 180 tablet 0 02/22/2017 at Unknown time     Assessment: Pharmacy consulted to dose heparin in this 61 year old male s/p femoral thromboembolectomy. Pt was on Eliquis at home.  Goal of Therapy:  Heparin level 0.3-0.7 units/ml Monitor platelets by anticoagulation protocol: Yes   Plan:  Baseline heparin level not ordered but both heparin level and aPTT are elevated. Will hold heparin infusion for 1 hour and resume infusion at 900 units/hr. Will check HL and aPTT 8 hours after resuming infusion. May be able to dose by HL only if levels continue to correlate, but that is difficult to determine at this time without a baseline HL.   12/27 2115 heparin level 1.23, aPTT >160. Hold drip x q hour and restart at 700 units/hr. Recheck aPTT and heparin level 6 hours after restart.  Loranda Mastel S 02/24/2017,11:27 PM

## 2017-02-24 NOTE — Progress Notes (Signed)
Dressing reinforced per MD verbal order.

## 2017-02-25 ENCOUNTER — Inpatient Hospital Stay: Payer: Medicaid Other

## 2017-02-25 ENCOUNTER — Inpatient Hospital Stay: Admit: 2017-02-25 | Payer: Medicaid Other

## 2017-02-25 ENCOUNTER — Encounter: Payer: Self-pay | Admitting: Vascular Surgery

## 2017-02-25 ENCOUNTER — Encounter
Admission: EM | Disposition: A | Discharge status: Other Acute Inpatient Hospital | Payer: Self-pay | Source: Home / Self Care | Attending: Vascular Surgery

## 2017-02-25 DIAGNOSIS — I743 Embolism and thrombosis of arteries of the lower extremities: Secondary | ICD-10-CM

## 2017-02-25 DIAGNOSIS — D6859 Other primary thrombophilia: Secondary | ICD-10-CM

## 2017-02-25 DIAGNOSIS — R0602 Shortness of breath: Secondary | ICD-10-CM

## 2017-02-25 DIAGNOSIS — G8220 Paraplegia, unspecified: Secondary | ICD-10-CM

## 2017-02-25 DIAGNOSIS — T8131XA Disruption of external operation (surgical) wound, not elsewhere classified, initial encounter: Secondary | ICD-10-CM

## 2017-02-25 DIAGNOSIS — N186 End stage renal disease: Secondary | ICD-10-CM

## 2017-02-25 HISTORY — PX: DIALYSIS/PERMA CATHETER INSERTION: CATH118288

## 2017-02-25 LAB — HEPARIN LEVEL (UNFRACTIONATED): Heparin Unfractionated: 0.68 IU/mL (ref 0.30–0.70)

## 2017-02-25 LAB — URINE DRUG SCREEN, QUALITATIVE (ARMC ONLY)
Amphetamines, Ur Screen: NOT DETECTED
Barbiturates, Ur Screen: NOT DETECTED
Benzodiazepine, Ur Scrn: POSITIVE — AB
CANNABINOID 50 NG, UR ~~LOC~~: NOT DETECTED
Cocaine Metabolite,Ur ~~LOC~~: NOT DETECTED
MDMA (ECSTASY) UR SCREEN: NOT DETECTED
METHADONE SCREEN, URINE: NOT DETECTED
Opiate, Ur Screen: POSITIVE — AB
Phencyclidine (PCP) Ur S: NOT DETECTED
TRICYCLIC, UR SCREEN: NOT DETECTED

## 2017-02-25 LAB — BASIC METABOLIC PANEL
ANION GAP: 10 (ref 5–15)
BUN: 42 mg/dL — AB (ref 6–20)
CALCIUM: 7.1 mg/dL — AB (ref 8.9–10.3)
CO2: 18 mmol/L — ABNORMAL LOW (ref 22–32)
Chloride: 105 mmol/L (ref 101–111)
Creatinine, Ser: 4.66 mg/dL — ABNORMAL HIGH (ref 0.61–1.24)
GFR calc Af Amer: 15 mL/min — ABNORMAL LOW (ref >60)
GFR, EST NON AFRICAN AMERICAN: 13 mL/min — AB (ref >60)
GLUCOSE: 114 mg/dL — AB (ref 65–99)
Potassium: 4.6 mmol/L (ref 3.5–5.1)
Sodium: 133 mmol/L — ABNORMAL LOW (ref 135–145)

## 2017-02-25 LAB — HEMOGLOBIN AND HEMATOCRIT, BLOOD
HEMATOCRIT: 24.3 % — AB (ref 40.0–52.0)
HEMOGLOBIN: 7.7 g/dL — AB (ref 13.0–18.0)

## 2017-02-25 LAB — CBC
HCT: 29.9 % — ABNORMAL LOW (ref 40.0–52.0)
Hemoglobin: 9.8 g/dL — ABNORMAL LOW (ref 13.0–18.0)
MCH: 29.8 pg (ref 26.0–34.0)
MCHC: 32.9 g/dL (ref 32.0–36.0)
MCV: 90.7 fL (ref 80.0–100.0)
PLATELETS: 102 10*3/uL — AB (ref 150–440)
RBC: 3.29 MIL/uL — ABNORMAL LOW (ref 4.40–5.90)
RDW: 15.6 % — AB (ref 11.5–14.5)
WBC: 25.5 10*3/uL — AB (ref 3.8–10.6)

## 2017-02-25 LAB — C4 COMPLEMENT: COMPLEMENT C4, BODY FLUID: 22 mg/dL (ref 14–44)

## 2017-02-25 LAB — C3 COMPLEMENT: C3 Complement: 96 mg/dL (ref 82–167)

## 2017-02-25 LAB — PROCALCITONIN: Procalcitonin: 2.81 ng/mL

## 2017-02-25 LAB — GLUCOSE, CAPILLARY
GLUCOSE-CAPILLARY: 108 mg/dL — AB (ref 65–99)
Glucose-Capillary: 118 mg/dL — ABNORMAL HIGH (ref 65–99)
Glucose-Capillary: 110 mg/dL — ABNORMAL HIGH (ref 65–99)
Glucose-Capillary: 100 mg/dL — ABNORMAL HIGH (ref 65–99)

## 2017-02-25 LAB — APTT: APTT: 111 s — AB (ref 24–36)

## 2017-02-25 LAB — HEPATITIS B SURFACE ANTIGEN: Hepatitis B Surface Ag: NEGATIVE

## 2017-02-25 LAB — HIV ANTIBODY (ROUTINE TESTING W REFLEX): HIV Screen 4th Generation wRfx: NONREACTIVE

## 2017-02-25 LAB — SURGICAL PATHOLOGY

## 2017-02-25 SURGERY — DIALYSIS/PERMA CATHETER INSERTION
Anesthesia: Moderate Sedation

## 2017-02-25 SURGERY — DIALYSIS/PERMA CATHETER INSERTION
Anesthesia: Moderate Sedation | Laterality: Right

## 2017-02-25 MED ORDER — CEFTRIAXONE SODIUM 1 G IJ SOLR
1.0000 g | INTRAMUSCULAR | Status: DC
  Filled 2017-02-25: qty 10

## 2017-02-25 MED ORDER — HEPARIN SODIUM (PORCINE) 10000 UNIT/ML IJ SOLN
INTRAMUSCULAR | Status: AC
  Filled 2017-02-25: qty 1

## 2017-02-25 MED ORDER — DEXTROSE 5 % IV SOLN
INTRAVENOUS | Status: AC
  Administered 2017-02-25: 1.5 g via INTRAVENOUS
  Filled 2017-02-25: qty 1.5

## 2017-02-25 MED ORDER — SODIUM CHLORIDE 0.9 % IV BOLUS (SEPSIS)
1000.0000 mL | Freq: Once | INTRAVENOUS | Status: AC
  Administered 2017-02-25: 1000 mL via INTRAVENOUS

## 2017-02-25 MED ORDER — CEFTRIAXONE SODIUM 2 G IJ SOLR
2.0000 g | Freq: Once | INTRAMUSCULAR | Status: AC
  Administered 2017-02-25: 2 g via INTRAVENOUS
  Filled 2017-02-25: qty 2

## 2017-02-25 MED ORDER — DEXTROSE 5 % IV SOLN: 2.0000 g | INTRAVENOUS | Status: DC

## 2017-02-25 MED ORDER — IOPAMIDOL (ISOVUE-370) INJECTION 76%
100.0000 mL | Freq: Once | INTRAVENOUS | Status: AC | PRN
  Administered 2017-02-25: 100 mL via INTRAVENOUS

## 2017-02-25 MED ORDER — IOPAMIDOL (ISOVUE-370) INJECTION 76%
75.0000 mL | Freq: Once | INTRAVENOUS | Status: AC | PRN
  Administered 2017-02-25: 75 mL via INTRAVENOUS

## 2017-02-25 MED ORDER — SODIUM CHLORIDE 0.9 % IV SOLN: INTRAVENOUS | Status: DC

## 2017-02-25 MED ORDER — MIDAZOLAM HCL 2 MG/2ML IJ SOLN
INTRAMUSCULAR | Status: AC
  Filled 2017-02-25: qty 2

## 2017-02-25 MED ORDER — LIDOCAINE-EPINEPHRINE (PF) 1 %-1:200000 IJ SOLN
INTRAMUSCULAR | Status: AC
  Filled 2017-02-25: qty 30

## 2017-02-25 MED ORDER — LORAZEPAM 2 MG/ML IJ SOLN
0.5000 mg | INTRAMUSCULAR | Status: DC | PRN
  Administered 2017-02-25: 0.5 mg via INTRAVENOUS
  Filled 2017-02-25: qty 1

## 2017-02-25 MED ORDER — LORAZEPAM 2 MG/ML IJ SOLN: 1.0000 mg | Freq: Once | INTRAMUSCULAR | Status: AC

## 2017-02-25 MED ORDER — DEXTROSE 5 % IV SOLN
1.5000 g | INTRAVENOUS | Status: DC
  Administered 2017-02-25: 1.5 g via INTRAVENOUS

## 2017-02-25 MED ORDER — IPRATROPIUM-ALBUTEROL 0.5-2.5 (3) MG/3ML IN SOLN
3.0000 mL | Freq: Four times a day (QID) | RESPIRATORY_TRACT | Status: DC
  Administered 2017-02-25 – 2017-02-26 (×3): 3 mL via RESPIRATORY_TRACT
  Filled 2017-02-25 (×3): qty 3

## 2017-02-25 MED ORDER — BUDESONIDE 0.25 MG/2ML IN SUSP
0.2500 mg | Freq: Four times a day (QID) | RESPIRATORY_TRACT | Status: DC
  Administered 2017-02-25 – 2017-02-26 (×3): 0.25 mg via RESPIRATORY_TRACT
  Filled 2017-02-25 (×4): qty 2

## 2017-02-25 MED ORDER — FENTANYL CITRATE (PF) 100 MCG/2ML IJ SOLN
INTRAMUSCULAR | Status: AC
  Filled 2017-02-25: qty 2

## 2017-02-25 MED ORDER — LORAZEPAM 2 MG/ML IJ SOLN
INTRAMUSCULAR | Status: AC
  Administered 2017-02-25: 1 mg
  Filled 2017-02-25: qty 1

## 2017-02-25 SURGICAL SUPPLY — 3 items
CATH PALINDROME RT-P 15FX28CM (CATHETERS) ×3 IMPLANT
PACK ANGIOGRAPHY (CUSTOM PROCEDURE TRAY) ×3 IMPLANT
SUT ETHILON 3-0 (SUTURE) ×9 IMPLANT

## 2017-02-25 NOTE — Progress Notes (Signed)
Dr Bard Herbert aware of patients hemoglobin being 7.7. At this time no new orders and MD acknowledged.

## 2017-02-25 NOTE — Progress Notes (Signed)
Pharmacy Antibiotic Note  Mitchell Dixon is a 61 y.o. male admitted on 02/23/2017 with UTI.  Pharmacy has been consulted for ceftriaxone dosing.  Plan: Will change ceftriaxone dosing to 1 g iv q 24 hours for UTI.   Height: 5\' 10"  (177.8 cm) Weight: 154 lb (69.9 kg) IBW/kg (Calculated) : 73  Temp (24hrs), Avg:98.5 F (36.9 C), Min:98.2 F (36.8 C), Max:98.9 F (37.2 C)  Recent Labs  Lab 02/23/17 1015 02/23/17 1030 02/24/17 0415 02/25/17 0214  WBC 8.4  --  19.9* 25.5*  CREATININE 1.71*  --  3.29* 4.66*  LATICACIDVEN  --  5.7*  --   --     Estimated Creatinine Clearance: 16.9 mL/min (A) (by C-G formula based on SCr of 4.66 mg/dL (H)).    No Known Allergies  Antimicrobials this admission: Perioperative cefazolin and cefuroxime  Ceftriaxone 12/28 >>  Dose adjustments this admission: CTX 2 g >> 1 g   Microbiology results: 12/28 UCx: pending  12/26 MRSA PCR: (-)  Thank you for allowing pharmacy to be a part of this patient's care.  Luisa Hart D 02/25/2017 2:12 PM

## 2017-02-25 NOTE — Progress Notes (Signed)
Hemodialysis started

## 2017-02-25 NOTE — Progress Notes (Signed)
1135 Patient very pale, poor mentation, and unable to obtain BP with automatic BP cuff and gasping respirations.on arrival to [facility] from procedure.  Obtained a systolic BP of 82 with doppler. HR 98, sats 100% . Placed on O2 2l/m,  IV fluids opened up to rate of 999. Stat H and H drawn and cross matched for 2 units of blood. Dr. Wyn Quaker  and charge in CCU notified of patient's condition. 1150 Transported to CCU by 2 RN's.

## 2017-02-25 NOTE — Progress Notes (Signed)
Patient alert, no complaints of shortness of breath. Patient does state that he is unable to move or feel both legs. Dr. Gilda Crease and Simmonds aware. Pedals must be dopplered bilaterally. Patient tolerating diet, minimal output noted. Dialysis catheter placed this afternoon. Dr Wyn Quaker. Patient scheduled to get dialysis today. Hemoglobin 7.7. Per Dr Bard Herbert we will not transfuse. CBC tomorrow in the am. At this time patient has no pain.

## 2017-02-25 NOTE — Progress Notes (Signed)
Post hemodialysis assessment

## 2017-02-25 NOTE — Progress Notes (Signed)
Patient restless c/o being hot saline bolus given 

## 2017-02-25 NOTE — Progress Notes (Signed)
I was notified at 9:10 pm that the patient was not able to move both legs.  This was different than the sign out I had received where I was told he only had loss of function in his right leg.  I spoke with Dr. Gilda Crease who had received no knowledge in the change in neuro status.  I also spoke with Dr. Wyn Quaker who said that he ahd motor function in his left leg during his fasciotomy closure this afternoon.  He does have doppler signals in both legs.  I do not think his change is related to vascular compromise.  This hs the potential to be a spinal cord issue, especially given his presentation with arterial dissection.  I have recommended a stat MRI of his spine and neurology consult for further evaluation, but would defer to the expertise of neurology  Durene Cal

## 2017-02-25 NOTE — Progress Notes (Signed)
Red alarm sounding. Upon entering room, writing nurse found patient to have ART line removed and pt bleeding from radial ART line site. Pressure applied until bleeding stopped, then pressure gauze applied. BP stable. EBL 50 mL. Site without complication. Previously cuff BP and ART line were correlating. Will continue to monitor.

## 2017-02-25 NOTE — Progress Notes (Signed)
[facility] [place], [place] 02/25/17  Subjective:   Patient continues to be critically ill.  He is not able to move his legs.  He has no sensation in his legs either.  Serum creatinine has further worsened to 4.67.  Potassium is 4.6.  BUN of 42. Urine output 760 cc.  Foley collection. Patient had returned from PermCath placement when seen.  Denies any nausea or vomiting.  He feels short of breath.  His chest x-ray is clear.Marland Kitchen.  He is requiring oxygen by nasal cannula.  Oxygen saturation is 100% with  supplementation  Objective:  Vital signs in last 24 hours:  Temp:  [98.2 F (36.8 C)-98.9 F (37.2 C)] 98.8 F (37.1 C) (12/28 0920) Pulse Rate:  [82-97] 83 (12/28 1300) Resp:  [12-28] 15 (12/28 1400) BP: (65-162)/(52-100) 112/80 (12/28 1400) SpO2:  [85 %-100 %] 100 % (12/28 1300) Arterial Line BP: (114-157)/(66-83) 138/80 (12/28 0300) FiO2 (%):  [28 %] 28 % (12/28 1242) Weight:  [69.9 kg (154 lb)] 69.9 kg (154 lb) (12/28 0920)  Weight change:  Filed Weights   02/23/17 1009 02/23/17 1824 02/25/17 0920  Weight: 70.3 kg (155 lb) 70.2 kg (154 lb 12.2 oz) 69.9 kg (154 lb)    Intake/Output:    Intake/Output Summary (Last 24 hours) at 02/25/2017 1533 Last data filed at 02/25/2017 1400 Gross per 24 hour  Intake 1494.58 ml  Output 825 ml  Net 669.58 ml     Physical Exam: General:  Anxious appearing, laying in the bed  HEENT  anicteric, moist oral mucous membranes  Neck  supple  Pulm/lungs  normal breathing effort, clear to auscultation  CVS/Heart  tachycardic  Abdomen:   Soft, nontender  Extremities:  Some peripheral edema on the right  Neurologic:  Unable to move both legs, no sensation to touch in both legs  Skin:  Congestive changes on the right  Access:  Left IJ PermCath       Basic Metabolic Panel:  Recent Labs  Lab 02/23/17 1015 02/24/17 0415 02/25/17 0214  NA 140 136 133*  K 3.6 5.0 4.6  CL 105 108 105  CO2 19* 16* 18*  GLUCOSE 144* 178*  114*  BUN 10 25* 42*  CREATININE 1.71* 3.29* 4.66*  CALCIUM 9.2 7.3* 7.1*     CBC: Recent Labs  Lab 02/23/17 1015 02/23/17 2246 02/24/17 0415 02/24/17 1033 02/24/17 1720 02/25/17 0214 02/25/17 1145  WBC 8.4  --  19.9*  --   --  25.5*  --   NEUTROABS 3.7  --   --   --   --   --   --   HGB 14.2 10.4* 9.4* 8.3* 7.1* 9.8* 7.7*  HCT 43.5 31.7* 29.1*  --   --  29.9* 24.3*  MCV 99.1  --  96.9  --   --  90.7  --   PLT 177  --  166  --   --  102*  --       Lab Results  Component Value Date   HEPBSAG Negative 02/24/2017      Microbiology:  Recent Results (from the past 240 hour(s))  MRSA PCR Screening     Status: None   Collection Time: 02/23/17  5:53 PM  Result Value Ref Range Status   MRSA by PCR NEGATIVE NEGATIVE Final    Comment:        The GeneXpert MRSA Assay (FDA approved for NASAL specimens only), is one component of a comprehensive MRSA colonization surveillance  program. It is not intended to diagnose MRSA infection nor to guide or monitor treatment for MRSA infections. Performed at [facility], [address] [place], [place] [address]     Coagulation Studies: Recent Labs    02/23/17 1015  LABPROT 13.8  INR 1.07    Urinalysis: Recent Labs    02/24/17 1205  COLORURINE AMBER*  LABSPEC 1.039*  PHURINE 5.0  GLUCOSEU 50*  HGBUR LARGE*  BILIRUBINUR NEGATIVE  KETONESUR 5*  PROTEINUR 30*  NITRITE NEGATIVE  LEUKOCYTESUR NEGATIVE      Imaging: Dg Chest 1 View  Result Date: 02/23/2017 CLINICAL DATA:  Central line placement. EXAM: CHEST 1 VIEW COMPARISON:  None. FINDINGS: Right internal jugular approach central venous catheter terminates at the cavoatrial junction. Cardiomediastinal silhouette is normal. Mediastinal contours appear intact. There is no evidence of focal airspace consolidation, pleural effusion or pneumothorax. Osseous structures are without acute abnormality. Soft tissues are grossly normal. IMPRESSION: Right internal  jugular approach central venous catheter terminates at the cavoatrial junction. No evidence of pneumothorax. Electronically Signed   By: Ted Mcalpine M.D.   On: 02/23/2017 17:08   Dg Chest Port 1 View  Result Date: 02/25/2017 CLINICAL DATA:  Shortness of Breath EXAM: PORTABLE CHEST 1 VIEW COMPARISON:  02/23/2017 FINDINGS: Cardiac shadow is within normal limits. The lungs are well aerated bilaterally. No focal infiltrate or sizable effusion is seen. No pneumothorax is noted. New dialysis catheter is noted on the left in satisfactory position. Right jugular central line is stable in appearance. No bony abnormality is noted. IMPRESSION: Status post dialysis catheter placement without complicating factors. Electronically Signed   By: Alcide Clever M.D.   On: 02/25/2017 12:35     Medications:   . sodium chloride Stopped (02/24/17 1830)  . [START ON 02/26/2017] cefTRIAXone (ROCEPHIN)  IV    . famotidine (PEPCID) IV     . atorvastatin  20 mg Oral Q2000  . budesonide (PULMICORT) nebulizer solution  0.25 mg Nebulization Q6H  . docusate sodium  100 mg Oral Daily  . Influenza vac split quadrivalent PF  0.5 mL Intramuscular Tomorrow-1000  . ipratropium-albuterol  3 mL Nebulization Q6H  . pneumococcal 23 valent vaccine  0.5 mL Intramuscular Tomorrow-1000   acetaminophen **OR** acetaminophen, alum & mag hydroxide-simeth, metoprolol tartrate, morphine injection, ondansetron, oxyCODONE-acetaminophen, phenol, potassium chloride, senna-docusate, sorbitol  Assessment/ Plan:  61 y.o. African-American male with hypertension, hyperlipidemia, stroke, cardiac thrombus, was admitted on 02/23/2017 with acute limb ischemia and underwent revascularization.  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  1.  Acute renal failure, nonoliguric 2.  Metabolic acidosis 3.  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  A PermCath was placed today.  Patient continues to have some bleeding issues on the surgical site.  We will dialyze the patient to treat any possibility of uremia contributing to the bleeding.  His hemoglobin has dropped from 14.2 at the time of admission to 7.7 today.  Continue to monitor clinically.  We will assess daily for need of further dialysis.   LOS: 2 Verlee Pope Thedore Mins 12/28/20183:33 PM  Central [address] [place], [place] [phone]

## 2017-02-25 NOTE — H&P (Signed)
[facility] History & Physical Update  The patient was interviewed and re-examined.  The patient's previous History and Physical has been reviewed and is unchanged.  There is no change in the plan of care. We plan to proceed with the scheduled procedure.  Festus Barren, MD  02/25/2017, 10:35 AM

## 2017-02-25 NOTE — Op Note (Signed)
OPERATIVE NOTE    PRE-OPERATIVE DIAGNOSIS: 1. ESRD 2. Aortic dissection 3. RLE ischemia  POST-OPERATIVE DIAGNOSIS: same as above  PROCEDURE: 1. Ultrasound guidance for vascular access to the left internal jugular vein 2. Fluoroscopic guidance for placement of catheter 3. Placement of a 28 cm tip to cuff tunneled hemodialysis catheter via the left internal jugular vein  SURGEON: Festus Barren, MD  ANESTHESIA:  Local  ESTIMATED BLOOD LOSS: 10 cc  FLUORO TIME: less than one minute  CONTRAST: none  FINDING(S): 1.  Patent left internal jugular vein  SPECIMEN(S):  None  INDICATIONS:   Mitchell Dixon is a 61 y.o. male who presents with renal failure after a dissection.  The patient needs long term dialysis access for their ESRD, and a Permcath is necessary.  Risks and benefits are discussed and informed consent is obtained.    DESCRIPTION: After obtaining full informed written consent, the patient was brought back to the vascular suited. The patient's left neck and chest were sterilely prepped and draped in a sterile surgical field was created. Moderate conscious sedation was administered during a face to face encounter with the patient throughout the procedure with my supervision of the RN administering medicines and monitoring the patient's vital signs, pulse oximetry, telemetry and mental status throughout from the start of the procedure until the patient was taken to the recovery room.  The left internal jugular vein was visualized with ultrasound and found to be patent. It was then accessed under direct ultrasound guidance and a permanent image was recorded. A wire was placed. After skin nick and dilatation, the peel-away sheath was placed over the wire. I then turned my attention to an area under the clavicle. Approximately 1-2 fingerbreadths below the clavicle a small counterincision was created and tunneled from the subclavicular incision to the access site. Using  fluoroscopic guidance, a 28 centimeter tip to cuff tunneled hemodialysis catheter was selected, and tunneled from the subclavicular incision to the access site. It was then placed through the peel-away sheath and the peel-away sheath was removed. Using fluoroscopic guidance the catheter tips were parked in the right atrium. The appropriate distal connectors were placed. It withdrew blood well and flushed easily with heparinized saline and a concentrated heparin solution was then placed. It was secured to the chest wall with 2 Prolene sutures. The access incision was closed single 4-0 Monocryl. A 4-0 Monocryl pursestring suture was placed around the exit site. Sterile dressings were placed. The patient tolerated the procedure well and was taken to the recovery room in stable condition.  COMPLICATIONS: None  CONDITION: Stable  Festus Barren  02/25/2017, 11:21 AM   This note was created with Dragon Medical transcription system. Any errors in dictation are purely unintentional.

## 2017-02-25 NOTE — Progress Notes (Signed)
Unable to get an accurate PCOT with [employer]. PCOT obtained by pulling blood from central line.

## 2017-02-25 NOTE — Progress Notes (Signed)
Dialysis post treatment assessment

## 2017-02-25 NOTE — Consult Note (Addendum)
ADDENDUM CTA Chest shows dissection in the aorta -  Most likely has infarct to the thoracic spine.  Will continue to follow for results.  [facility]  Impression:    61 year old male with abrupt onset lower extremity weakness, bilaterally after surgery.  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 Not a tPA candidate because of surgery today. Also spinal cord infarct with tpa could be dangerous. Also onset is unkown, given that right leg did not work two days ago, would not be a candidate for tPA at this time.      Recommendations:   #1. CT head and CT Angio head and Neck stat, with Chest Abdomen/Pelvis #2. MRI without contrast of the lumbar spine stat. MRI of thoracic spine without contrast stat., and MRI Cervical spine #3. CT head, CTA H/N ordered stat. ADDENDUM #4. Patinet was emergently transferrred for Ascending Aortic Dissection   ---------------------------------------------------------------------  CC:  History of Present Illness: Mitchell Dixon is a 61 y.o. that comes in with abrupt onset lower extremity weakeness, with history of blood clots prior and  Had a right leg evac and fasciotomy and developed bilateral le paralysis, with right let. He is bale to move his legs and completely absent.    He came to the emergency room he coul dnot move his right leg could not come to the emergency room on the 26th. The left leg is now weak. He could not move both legs, when he came to the emergency room.    Diagnostic Testing:  CT of his head   He is not able to move his bilateral lower extremities.      CT head is negative.   Vital Signs:    Vitals:   02/25/17 1945 02/25/17 2000 02/25/17 2015 02/25/17 2030  BP: 114/69 (!) 82/68 110/69 121/85  Pulse:      Resp: (!) 23 (!) 27 19 (!) 32  Temp:  97.8 F (36.6 C)  97.6 F (36.4 C)  TempSrc:  Axillary  Axillary  SpO2:      Weight:       Height:          Exam:  Mental Status:  Awake, alert, oriented  Naming: Intact Repetition: Intact   Speech: fluent  Cranial Nerves:  Pupils: Equal round and reactive to light Extraocular movements: Intact in all cardinal gaze Ptosis: Absent Visual fields: Intact to finger counting Facial sensation: Intact to pin and light touch Facial movements: Intact and symmetric    Motor Examination: Not able to feel in the left or the right leg, and so now this is acute.  Also at the same time has a foley in so unable to tell about urinary incontinence.  Reflexes not able to be done by nursing @ bediside.   Medical Decision Making:  - Extensive number of diagnosis or management options are considered above.   - Extensive amount of complex data reviewed.   - High risk of complication and/or morbidity or mortality are associated with differential diagnostic considerations above.  - There may be uncertain outcome and increased probability of prolonged functional impairment or high probability of severe prolonged functional impairment associated with some of these differential diagnosis.   Medical Data Reviewed:  1.Data reviewed include clinical labs, radiology,  Medical Tests;   2.Tests results discussed w/performing or interpreting physician;   3.Obtaining/reviewing old medical records;  4.Obtaining case history from another source;  5.Independent review of image,  tracing or specimen.    Patient was informed the Neurology Consult would happen via telehealth (remote video) and consented to receiving care in this manner.

## 2017-02-25 NOTE — Progress Notes (Signed)
Pharmacy Antibiotic Note  Mitchell Dixon is a 61 y.o. male admitted on 02/23/2017 with UTI.  Pharmacy has been consulted for ceftriaxone dosing.  Plan: Ceftriaxone 2 grams q 24 hours ordered.  Height: 5\' 10"  (177.8 cm) Weight: 154 lb 12.2 oz (70.2 kg) IBW/kg (Calculated) : 73  Temp (24hrs), Avg:98.3 F (36.8 C), Min:97.4 F (36.3 C), Max:98.9 F (37.2 C)  Recent Labs  Lab 02/23/17 1015 02/23/17 1030 02/24/17 0415 02/25/17 0214  WBC 8.4  --  19.9* 25.5*  CREATININE 1.71*  --  3.29* 4.66*  LATICACIDVEN  --  5.7*  --   --     Estimated Creatinine Clearance: 16.9 mL/min (A) (by C-G formula based on SCr of 4.66 mg/dL (H)).    No Known Allergies  Antimicrobials this admission: Cefazolin (surg) ceftriaxone 12/28 >>    >>   Dose adjustments this admission:   Microbiology results: 12/28 UCx: pending  12/26 MRSA PCR: (-)      12/27 UA: LE(-) NO2(-)  WBC TNTC Thank you for allowing pharmacy to be a part of this patient's care.  Massiel Stipp S 02/25/2017 3:36 AM

## 2017-02-25 NOTE — Progress Notes (Signed)
Dr Gilda Crease at bedside to evaluate patient. MD is aware that patient unable to move right leg. Moderate blood drainage noted. Per MD he will take him back to surgery and to re-inforce dressing. NPO

## 2017-02-25 NOTE — Progress Notes (Signed)
CRITICAL VALUE ALERT  Critical Value:  Creatinine 4.66, Procalcitonin 2.8, WBC 25.5, Hgb 9.8  Date & Time Notied:  02/25/17 0325  Provider Notified: Annabelle Harman, NP  Orders Received/Actions taken: Order for Cefepime

## 2017-02-25 NOTE — Op Note (Signed)
[facility]   OPERATIVE NOTE  DATE: 02/25/2017  PRE-OPERATIVE DIAGNOSIS: Open wounds right lower extremity status post fasciotomies performed for profound ischemia with revascularization with persistent neurologic deficit present and bleeding from the wounds  POST-OPERATIVE DIAGNOSIS: same as above  PROCEDURE: 1.   Right lower extremity fasciotomy closures  SURGEON: Festus Barren  ASSISTANT(S): None  ANESTHESIA: None  ESTIMATED BLOOD LOSS: 10 cc  FINDING(S): 1.  None   INDICATIONS:   Mitchell Dixon is a 61 y.o. male who presents with bleeding from his fasciotomy sites in the right lower extremity.  He continues to have profound neurologic deficit of the right lower extremity despite revascularization in a prompt fashion.  He is bleeding from these wounds and they should be close to stop the bleeding.  Risks and benefits were discussed and informed consent was obtained.  DESCRIPTION: After obtaining full informed written consent, the patient was brought back to the operating room and placed supine upon the operating table.  The patient was prepped and draped in the standard fashion.  The medial wound was closed with a running 3-0 nylon suture without tension.  The lateral wound was more swollen and had more bleeding.  A larger 2-0 nylon suture as well as a 3-0 nylon suture were required to close this wound.  These were performed in a running fashion and the wound was closed without difficulty.  Sterile dressings were then placed. At this point, we elected to complete the procedure.  The patient was taken to the recovery room in stable condition.   COMPLICATIONS: None  CONDITION: Stable  Festus Barren  02/25/2017, 11:22 AM    This note was created with Dragon Medical transcription system. Any errors in dictation are purely unintentional.

## 2017-02-25 NOTE — Progress Notes (Signed)
Hemodialysis pre-assessment 

## 2017-02-25 NOTE — Progress Notes (Signed)
PULMONARY / CRITICAL CARE MEDICINE   Name: Mitchell Dixon MRN: [id number] DOB: 08-09-1955    ADMISSION DATE:  02/23/2017 CONSULTATION DATE:  12/26  REFERRING MD:  Gilda Crease   PR PROFILE:   77 M smoker with hx of htn, CVA, LV thrombus who was off of his apixaban for several day and presented to ED with acute onset of abdominal and LE pain. CTA revealed occlusion of R common iliac artery with complete absence of flow to entire R leg. Went urgently to OR where abdominal, iliac and femoral artery dissection with ischemia to RLE was found.  Underwent extensive procedure including embolectomy, angioplasty and right calf fasciotomy.  Admitted to ICU postoperatively on heparin infusion.  Also noted to have acute kidney injury.  Underwent PermCath placement 12/28  MAJOR EVENTS/TEST RESULTS: 12/26 Admission via ED as above 12/26 Emergent OR with embolectomy of R common and external iliac arteries, profunda femoral rest and superficial femoral arteries.  Also, open transluminal angioplasty and stent placement right common iliac artery, percutaneous transluminal angioplasty and stent placement left common iliac artery, percutaneous transluminal angioplasty and stent placement left external iliac artery, open cutdown right common femoral artery, 4 compartment fasciotomies right calf.  To ICU postop on heparin infusion. 12/27 worsening acute kidney injury.  Nephrology consultation. 12/28 Paraplegia with nearly total loss of all sensation in BLE. Underwent closure of RLE fasciotomy. PermCath placed. HD initiated  INDWELLING DEVICES: L IJ Permcath (tunneled) 12/28 R IJ CVL 12/28 >>   MICRO DATA: MRSA PCR 12/26 >> NEG  ANTIMICROBIALS:  Ceftriaxone 12/29 >>     SUBJECTIVE:  Returned to ICU from procedure with complaints of shortness of breath and with hypotension.  BP improved after NS bolus.  CXR without acute findings.  Bronchodilators initiated.  VITAL SIGNS: BP 136/78   Pulse 83   Temp  98.8 F (37.1 C) (Oral)   Resp (!) 21   Ht 5\' 10"  (1.778 m)   Wt 69.9 kg (154 lb)   SpO2 100%   BMI 22.10 kg/m   HEMODYNAMICS:    VENTILATOR SETTINGS: FiO2 (%):  [28 %] 28 %  INTAKE / OUTPUT: I/O last 3 completed shifts: In: 3455.6 [P.O.:540; I.V.:1895.6; Blood:815; Other:55; IV Piggyback:150] Out: 1220 [Urine:1220]  PHYSICAL EXAMINATION: General: Cognition intact.  No overt distress Neuro: CNs intact, upper extremity strength normal, BLE paraplegia with complete sensory loss HEENT: NCAT, sclerae white Cardiovascular: Reg, no M Lungs: Mildly diminished breath sounds throughout without adventitious sounds Abdomen: Soft, diminished BS, nontender Ext: RLE edema, left DP pulse palpable, right DP pulse dopplerable Skin:  RLE calf in surgical dressing  LABS:  BMET Recent Labs  Lab 02/23/17 1015 02/24/17 0415 02/25/17 0214  NA 140 136 133*  K 3.6 5.0 4.6  CL 105 108 105  CO2 19* 16* 18*  BUN 10 25* 42*  CREATININE 1.71* 3.29* 4.66*  GLUCOSE 144* 178* 114*    Electrolytes Recent Labs  Lab 02/23/17 1015 02/24/17 0415 02/25/17 0214  CALCIUM 9.2 7.3* 7.1*    CBC Recent Labs  Lab 02/23/17 1015  02/24/17 0415  02/24/17 1720 02/25/17 0214 02/25/17 1145  WBC 8.4  --  19.9*  --   --  25.5*  --   HGB 14.2   < > 9.4*   < > 7.1* 9.8* 7.7*  HCT 43.5   < > 29.1*  --   --  29.9* 24.3*  PLT 177  --  166  --   --  102*  --    < > =  values in this interval not displayed.    Coag's Recent Labs  Lab 02/23/17 1015 02/24/17 1000 02/24/17 2114 02/25/17 0612  APTT 30 >160* >160* 111*  INR 1.07  --   --   --     Sepsis Markers Recent Labs  Lab 02/23/17 1030 02/24/17 0514 02/25/17 0214  LATICACIDVEN 5.7*  --   --   PROCALCITON  --  3.29 2.81    ABG No results for input(s): PHART, PCO2ART, PO2ART in the last 168 hours.  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 Extensive embolization to renal arteries and lower extremities P:  MAP goal > 55 mmHg Holding metoprolol and amlodipine  RENAL A:   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 P:   Monitor BMET intermittently Monitor I/Os Correct electrolytes as indicated Nephrology following HD initiated 12/28  GASTROINTESTINAL A:   Abdominal pain -likely due to vascular insufficiency P:   SUP: IV famotidine Renal/carb modified diet  HEMATOLOGIC A:   Acute blood loss anemia Thrombocytopenia Apparent thrombophilia -unclear if this has been evaluated by Hematology P:  DVT px: full dose heparin Monitor CBC intermittently Transfuse per usual guidelines  Timing of resumption of heparin per vascular surgery Will need lifelong anticoagulation Would consider Hematology consultation at some point  INFECTIOUS A:   Pyuria P:   Monitor temp, WBC count Micro and abx as above   ENDOCRINE A:   No issues P:   Monitor glucose on chem panels  NEUROLOGIC A:   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

## 2017-02-25 NOTE — Progress Notes (Signed)
ANTICOAGULATION CONSULT NOTE - Initial Consult  Pharmacy Consult for Heparin  Indication: arterial embolism  No Known Allergies  Patient Measurements: Height: 5\' 10"  (177.8 cm) Weight: 154 lb 12.2 oz (70.2 kg) IBW/kg (Calculated) : 73 Heparin Dosing Weight: 70.3 kg   Vital Signs: Temp: 98.2 F (36.8 C) (12/28 0125) Temp Source: Oral (12/28 0125) BP: 146/93 (12/28 0625) Pulse Rate: 88 (12/28 0200)  Labs: Recent Labs    02/23/17 1015 02/23/17 2246 02/24/17 0415 02/24/17 1000  02/24/17 1720 02/24/17 2114 02/25/17 0214 02/25/17 0612  HGB 14.2 10.4* 9.4*  --    < > 7.1*  --  9.8*  --   HCT 43.5 31.7* 29.1*  --   --   --   --  29.9*  --   PLT 177  --  166  --   --   --   --  102*  --   APTT 30  --   --  >160*  --   --  >160*  --  111*  LABPROT 13.8  --   --   --   --   --   --   --   --   INR 1.07  --   --   --   --   --   --   --   --   HEPARINUNFRC  --   --   --  1.01*  --   --  1.23*  --  0.68  CREATININE 1.71*  --  3.29*  --   --   --   --  4.66*  --   TROPONINI 0.14*  --   --   --   --   --   --   --   --    < > = values in this interval not displayed.    Estimated Creatinine Clearance: 16.9 mL/min (A) (by C-G formula based on SCr of 4.66 mg/dL (H)).   Medical History: Past Medical History:  Diagnosis Date  . Essential hypertension     Medications:  Medications Prior to Admission  Medication Sig Dispense Refill Last Dose  . amLODipine (NORVASC) 5 MG tablet Take 1 tablet (5 mg total) by mouth daily. 30 tablet 3 02/22/2017 at Unknown time  . apixaban (ELIQUIS) 5 MG TABS tablet Take 1 tab (5 mg ) bid (Patient taking differently: Take 5 mg by mouth 2 (two) times daily. Take 1 tab (5 mg ) bid) 60 tablet 3 02/22/2017 at Unknown time  . atorvastatin (LIPITOR) 40 MG tablet TAKE ONE TABLET BY MOUTH EVERY EVENING AT 6:00pm 90 tablet 0 02/22/2017 at Unknown time  . lisinopril (PRINIVIL,ZESTRIL) 20 MG tablet Take 2 tablets (40 mg total) by mouth daily. 180 tablet 0  02/22/2017 at Unknown time  . metoprolol tartrate (LOPRESSOR) 25 MG tablet TAKE ONE TABLET BY MOUTH 2 TIMES A DAY 180 tablet 0 02/22/2017 at Unknown time    Assessment: Pharmacy consulted to dose heparin in this 61 year old male s/p femoral thromboembolectomy. Pt was on Eliquis at home.  Goal of Therapy:  Heparin level 0.3-0.7 units/ml Monitor platelets by anticoagulation protocol: Yes   Plan:  Baseline heparin level not ordered but both heparin level and aPTT are elevated. Will hold heparin infusion for 1 hour and resume infusion at 900 units/hr. Will check HL and aPTT 8 hours after resuming infusion. May be able to dose by HL only if levels continue to correlate, but that is difficult to determine at this  time without a baseline HL.   12/27 2115 heparin level 1.23, aPTT >160. Hold drip x q hour and restart at 700 units/hr. Recheck aPTT and heparin level 6 hours after restart.  12/28 0615 heparin level 0.68. APTT 111. Decrease to 600 units/hr. Recheck aPTT in 6 hours.  Fulton Reek, PharmD, BCPS  02/25/17 6:48 AM

## 2017-02-26 ENCOUNTER — Ambulatory Visit (HOSPITAL_COMMUNITY)
Admission: AD | Admit: 2017-02-26 | Discharge: 2017-02-26 | Disposition: A | Discharge status: Home / Self Care | Payer: Medicaid Other | Source: Other Acute Inpatient Hospital | Attending: Vascular Surgery | Admitting: Vascular Surgery

## 2017-02-26 DIAGNOSIS — I7100 Dissection of unspecified site of aorta: Secondary | ICD-10-CM | POA: Insufficient documentation

## 2017-02-26 DIAGNOSIS — G8220 Paraplegia, unspecified: Secondary | ICD-10-CM

## 2017-02-26 DIAGNOSIS — D6859 Other primary thrombophilia: Secondary | ICD-10-CM

## 2017-02-26 DIAGNOSIS — I743 Embolism and thrombosis of arteries of the lower extremities: Secondary | ICD-10-CM

## 2017-02-26 DIAGNOSIS — R0602 Shortness of breath: Secondary | ICD-10-CM

## 2017-02-26 LAB — COMPREHENSIVE METABOLIC PANEL
ALBUMIN: 2.3 g/dL — AB (ref 3.5–5.0)
ALT: 48 U/L (ref 17–63)
ANION GAP: 12 (ref 5–15)
AST: 258 U/L — AB (ref 15–41)
Alkaline Phosphatase: 43 U/L (ref 38–126)
BILIRUBIN TOTAL: 0.4 mg/dL (ref 0.3–1.2)
BUN: 42 mg/dL — AB (ref 6–20)
CHLORIDE: 101 mmol/L (ref 101–111)
CO2: 21 mmol/L — ABNORMAL LOW (ref 22–32)
Calcium: 6.8 mg/dL — ABNORMAL LOW (ref 8.9–10.3)
Creatinine, Ser: 4.73 mg/dL — ABNORMAL HIGH (ref 0.61–1.24)
GFR calc Af Amer: 14 mL/min — ABNORMAL LOW (ref >60)
GFR calc non Af Amer: 12 mL/min — ABNORMAL LOW (ref >60)
GLUCOSE: 107 mg/dL — AB (ref 65–99)
POTASSIUM: 4.5 mmol/L (ref 3.5–5.1)
SODIUM: 134 mmol/L — AB (ref 135–145)
TOTAL PROTEIN: 4.6 g/dL — AB (ref 6.5–8.1)

## 2017-02-26 LAB — HEPATITIS C ANTIBODY

## 2017-02-26 LAB — CBC
HEMATOCRIT: 16.7 % — AB (ref 40.0–52.0)
Hemoglobin: 5.4 g/dL — ABNORMAL LOW (ref 13.0–18.0)
MCH: 29.5 pg (ref 26.0–34.0)
MCHC: 32.4 g/dL (ref 32.0–36.0)
MCV: 91.1 fL (ref 80.0–100.0)
PLATELETS: 71 10*3/uL — AB (ref 150–440)
RBC: 1.83 MIL/uL — ABNORMAL LOW (ref 4.40–5.90)
RDW: 15.9 % — AB (ref 11.5–14.5)
WBC: 21.7 10*3/uL — AB (ref 3.8–10.6)

## 2017-02-26 LAB — MAGNESIUM: Magnesium: 1.7 mg/dL (ref 1.7–2.4)

## 2017-02-26 LAB — PHOSPHORUS: Phosphorus: 5.5 mg/dL — ABNORMAL HIGH (ref 2.5–4.6)

## 2017-02-26 LAB — PREPARE RBC (CROSSMATCH)

## 2017-02-26 LAB — URINE CULTURE: CULTURE: NO GROWTH

## 2017-02-26 LAB — PROCALCITONIN: PROCALCITONIN: 2.93 ng/mL

## 2017-02-26 LAB — HEPATITIS B SURFACE ANTIBODY,QUALITATIVE: HEP B S AB: NONREACTIVE

## 2017-02-26 LAB — HEPATITIS B CORE ANTIBODY, TOTAL: Hep B Core Total Ab: NEGATIVE

## 2017-02-26 LAB — ANA W/REFLEX IF POSITIVE: Anti Nuclear Antibody(ANA): NEGATIVE

## 2017-02-26 LAB — PROTIME-INR
INR: 1.11
PROTHROMBIN TIME: 14.2 s (ref 11.4–15.2)

## 2017-02-26 MED ORDER — LABETALOL HCL 5 MG/ML IV SOLN
5.0000 mg | INTRAVENOUS | Status: DC | PRN
  Administered 2017-02-26: 5 mg via INTRAVENOUS

## 2017-02-26 MED ORDER — LORAZEPAM 2 MG/ML IJ SOLN
0.5000 mg | INTRAMUSCULAR | 0 refills | Status: DC | PRN
Start: 2017-02-26 — End: 2017-04-07

## 2017-02-26 MED ORDER — FAMOTIDINE IN NACL 20-0.9 MG/50ML-% IV SOLN: 20.0000 mg | INTRAVENOUS | Status: DC

## 2017-02-26 MED ORDER — SORBITOL 70 % SOLN
30.0000 mL | Freq: Every day | Status: DC | PRN
Start: 2017-02-26 — End: 2017-04-07

## 2017-02-26 MED ORDER — PHENOL 1.4 % MT LIQD: 1.0000 | OROMUCOSAL | 0 refills | Status: DC | PRN

## 2017-02-26 MED ORDER — ONDANSETRON HCL 4 MG/2ML IJ SOLN
4.00 mg | INTRAMUSCULAR | Status: DC
Start: ? — End: 2017-02-26

## 2017-02-26 MED ORDER — LABETALOL HCL 5 MG/ML IV SOLN
5.0000 mg | INTRAVENOUS | Status: DC | PRN
  Administered 2017-02-26 (×2): 5 mg via INTRAVENOUS
  Filled 2017-02-26 (×2): qty 4

## 2017-02-26 MED ORDER — POLYETHYLENE GLYCOL 3350 17 G PO PACK
17.00 | PACK | ORAL | Status: DC
Start: 2017-02-26 — End: 2017-02-26

## 2017-02-26 MED ORDER — MORPHINE SULFATE (PF) 2 MG/ML IV SOLN: 2.0000 mg | INTRAVENOUS | 0 refills | Status: DC | PRN

## 2017-02-26 MED ORDER — SENNOSIDES-DOCUSATE SODIUM 8.6-50 MG PO TABS: 1.0000 | ORAL_TABLET | Freq: Every evening | ORAL | Status: DC | PRN

## 2017-02-26 MED ORDER — LABETALOL HCL 5 MG/ML IV SOLN: 5.0000 mg | INTRAVENOUS | Status: DC | PRN

## 2017-02-26 MED ORDER — SODIUM CHLORIDE 0.9 % IV SOLN
20.00 | INTRAVENOUS | Status: DC
Start: ? — End: 2017-02-26

## 2017-02-26 MED ORDER — DOCUSATE SODIUM 100 MG PO CAPS
100.00 mg | ORAL_CAPSULE | ORAL | Status: DC
Start: 2017-02-26 — End: 2017-02-26

## 2017-02-26 MED ORDER — DOCUSATE SODIUM 100 MG PO CAPS: 100.0000 mg | ORAL_CAPSULE | Freq: Every day | ORAL | 0 refills | Status: DC

## 2017-02-26 MED ORDER — BUDESONIDE 0.25 MG/2ML IN SUSP: 0.2500 mg | Freq: Four times a day (QID) | RESPIRATORY_TRACT | 12 refills | Status: DC

## 2017-02-26 MED ORDER — POTASSIUM CHLORIDE CRYS ER 20 MEQ PO TBCR: 20.0000 meq | EXTENDED_RELEASE_TABLET | Freq: Every day | ORAL | Status: DC | PRN

## 2017-02-26 MED ORDER — SODIUM CHLORIDE 0.9 % IV SOLN
Freq: Once | INTRAVENOUS | Status: AC
  Administered 2017-02-26: 03:00:00 via INTRAVENOUS

## 2017-02-26 MED ORDER — DEXTROSE 5 % IV SOLN: 1.0000 g | INTRAVENOUS | Status: DC

## 2017-02-26 MED ORDER — INFLUENZA VAC SPLIT QUAD 0.5 ML IM SUSY
0.50 | PREFILLED_SYRINGE | INTRAMUSCULAR | Status: DC
Start: ? — End: 2017-02-26

## 2017-02-26 MED ORDER — ALUM & MAG HYDROXIDE-SIMETH 200-200-20 MG/5ML PO SUSP: 15.0000 mL | ORAL | 0 refills | Status: DC | PRN

## 2017-02-26 MED ORDER — INFLUENZA VAC SPLIT QUAD 0.5 ML IM SUSY: 0.5000 mL | PREFILLED_SYRINGE | INTRAMUSCULAR | 0 refills | Status: AC

## 2017-02-26 MED ORDER — CLEVIDIPINE 25 MG/50ML IV EMUL
0.00 | INTRAVENOUS | Status: DC
Start: ? — End: 2017-02-26

## 2017-02-26 MED ORDER — IPRATROPIUM-ALBUTEROL 0.5-2.5 (3) MG/3ML IN SOLN: 3.0000 mL | Freq: Four times a day (QID) | RESPIRATORY_TRACT | Status: DC

## 2017-02-26 MED ORDER — ACETAMINOPHEN 650 MG RE SUPP
650.00 mg | RECTAL | Status: DC
Start: ? — End: 2017-02-26

## 2017-02-26 MED ORDER — ACETAMINOPHEN 325 MG PO TABS
650.00 mg | ORAL_TABLET | ORAL | Status: DC
Start: ? — End: 2017-02-26

## 2017-02-26 MED ORDER — HYDRALAZINE HCL 20 MG/ML IJ SOLN
10.00 mg | INTRAMUSCULAR | Status: DC
Start: ? — End: 2017-02-26

## 2017-02-26 MED ORDER — ATORVASTATIN CALCIUM 20 MG PO TABS: 20.0000 mg | ORAL_TABLET | Freq: Every day | ORAL | Status: DC

## 2017-02-26 MED ORDER — ONDANSETRON HCL 4 MG/2ML IJ SOLN: 4.0000 mg | Freq: Four times a day (QID) | INTRAMUSCULAR | 0 refills | Status: DC | PRN

## 2017-02-26 MED ORDER — LORAZEPAM 2 MG/ML IJ SOLN
1.0000 mg | Freq: Once | INTRAMUSCULAR | Status: AC
  Administered 2017-02-26: 1 mg via INTRAVENOUS

## 2017-02-26 MED ORDER — ESMOLOL HCL-SODIUM CHLORIDE 2500 MG/250ML IV SOLN
0.00 | INTRAVENOUS | Status: DC
Start: ? — End: 2017-02-26

## 2017-02-26 MED ORDER — OXYCODONE-ACETAMINOPHEN 5-325 MG PO TABS: 1.0000 | ORAL_TABLET | ORAL | 0 refills | Status: DC | PRN

## 2017-02-26 NOTE — Progress Notes (Signed)
Patient seen on routine rounds. Unable to move either extremities. Also reports complete loss of sensation in bilateral lower extremities with significant loss in light touch as well as pinprick sensation.  Heparin on hold secondary to bleeding from right calf fasciotomy. Dr. Myra Gianotti notified of physical exam findings.  Also inquired from Dr. Myra Gianotti if heparin should be restarted.  Per Dr.Brabham's recommendations, neurology was consulted and an MRI of the L-spine was ordered.  Patient was evaluated by telemetry neurology- Dr. Vivien Rota.  The [facility] recommended the stat CT head, CTA head and neck, CTA chest abdomen and pelvis, MRI L-spine and MRI of the spine.  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  Imaging results discussed with Dr. Myra Gianotti who recommended transferring the patient to eat at [facility] or [facility].  [facility] contacted and patient accepted by the vascular team.  Dr. Alphonsus Sias and Dr. Patrice Paradise at the accepting physicians.  Patient transported by [facility] to [facility]. Patient's sister notified and updated on imaging findings and transfer  [name] S. [facility] ANP-BC Pulmonary and Critical Care Medicine [facility] Pager [phone] or [phone]

## 2017-02-26 NOTE — Progress Notes (Signed)
Report called to TICU RN, Arline Asp and the air transport RN, [place]. All questions answered and number given to them incase they needed to contact me. [facility] is setting up transportation and will call me back with an update.

## 2017-02-26 NOTE — Progress Notes (Signed)
Patient received a total of 5mg  of morphine and a total of 2mg  from additional doses of lorazepam to keep patient calm during CT/MRI. Patient remains lethargic from medications.

## 2017-02-26 NOTE — Discharge Summary (Signed)
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 CTA revealed occlusion of R common iliac artery with complete absence of flow to entire R leg. Went urgently to OR where abdominal, iliac and femoral artery dissection with ischemia to RLE was found.  Underwent extensive procedure including embolectomy, angioplasty and right calf fasciotomy.  Admitted to ICU postoperatively on heparin infusion.  Also noted to have acute kidney injury.  Underwent PermCath placement  And hemodialysis 12/28  MAJOR EVENTS/TEST RESULTS: 12/26 Admission via ED as above 12/26 Emergent OR with embolectomy of R common and external iliac arteries, profunda femoral rest and superficial femoral arteries.  Also, open transluminal angioplasty and stent placement right common iliac artery, percutaneous transluminal angioplasty and stent placement left common iliac artery, percutaneous transluminal angioplasty and stent placement left external iliac artery, open cutdown right common femoral artery, 4 compartment fasciotomies right calf.  To ICU postop on heparin infusion. 12/27 worsening acute  kidney injury.  Nephrology consultation. 12/28 Paraplegia with nearly total loss of all sensation in BLE. Underwent closure of RLE fasciotomy. PermCath placed. HD initiated 12/29>transfer to [facility] for type A aortic dissection  PROCEDURES 1. Ultrasound guidance for vascular access left femoral artery 2.Catheter placement into aorta from bilateral femoral approaches 3. Aortogram and bilateral ileofemoral angiogram 4. Fogarty embolectomy of the right iliac artery and Fogarty embolectomy of the right SFA and popliteal arteries as well as the right profunda femoris artery 5. Kissing balloon stent placement to bilateral common iliac arteries extending into the distal aorta with 12 mm diameter by 38 mm length Lifestream stents on each side 6.  Self-expanding stent placement in the left distal common iliac artery and external iliac artery for flow-limiting dissection with 14 mm diameter by 6 cm length life star stent 7. StarClose closure device left femoral artery  8.  4 compartment fasciotomies right calf 9. Right IJ TLC 10. Right [place] HD catheter 11. Open cutdown right common femoral artery  CONSULTATIONS Nephology Vascular Neurology Hematology (pending)  INDWELLING DEVICES: L IJ Permcath (tunneled) 12/28 R IJ CVL 12/28 >>   MICRO DATA: MRSA PCR 12/26 >> NEG  ANTIMICROBIALS:  Ceftriaxone 12/29 >>     SUBJECTIVE:  Returned to ICU from procedure with complaints of shortness of breath and with hypotension.  BP improved after NS bolus.  CXR without acute findings.  Bronchodilators initiated.  VITAL SIGNS: BP 114/80   Pulse (!) 107   Temp 98.4 F (36.9 C) (Axillary)   Resp 14   Ht 5\' 10"  (1.778 m)   Wt 70.4 kg (155 lb 3.3 oz)   SpO2 96%   BMI 22.27 kg/m   HEMODYNAMICS:    VENTILATOR SETTINGS: FiO2 (%):  [28 %] 28 %  INTAKE / OUTPUT: I/O last  3 completed shifts: In: 2619.6 [P.O.:540; I.V.:1059.6; Blood:815; Other:55; IV Piggyback:150] Out: 1085 [Urine:1085]  PHYSICAL  EXAMINATION: General: Cognition intact.  No overt distress Neuro: CNs intact, upper extremity strength normal, BLE paraplegia with complete sensory loss  HEENT: NCAT, sclerae white Cardiovascular: Apical pulse tachycardic, S1/S2, no MRG, +3 edema RLE, +1 in LLE, +2 pulses in BLUE, Doppler pedal pulses in BLLE Lungs: Mildly diminished breath sounds throughout without adventitious sounds Abdomen: Soft, diminished BS, nontender Ext: RLE edema, left DP pulse palpable, right DP pulse dopplerable MS: +ROM in BLUE, unable to move lower extremities Skin:  RLE calf in surgical dressing  LABS:  BMET Recent Labs  Lab 02/24/17 0415 02/25/17 0214 02/26/17 0123  NA 136 133* 134*  K 5.0 4.6 4.5  CL 108 105 101  CO2 16* 18* 21*  BUN 25* 42* 42*  CREATININE 3.29* 4.66* 4.73*  GLUCOSE 178* 114* 107*    Electrolytes Recent Labs  Lab 02/24/17 0415 02/25/17 0214 02/26/17 0123  CALCIUM 7.3* 7.1* 6.8*  MG  --   --  1.7  PHOS  --   --  5.5*    CBC Recent Labs  Lab 02/24/17 0415  02/25/17 0214 02/25/17 1145 02/26/17 0123  WBC 19.9*  --  25.5*  --  21.7*  HGB 9.4*   < > 9.8* 7.7* 5.4*  HCT 29.1*  --  29.9* 24.3* 16.7*  PLT 166  --  102*  --  71*   < > = values in this interval not displayed.    Coag's Recent Labs  Lab 02/23/17 1015 02/24/17 1000 02/24/17 2114 02/25/17 0612 02/26/17 0123  APTT 30 >160* >160* 111*  --   INR 1.07  --   --   --  1.11    Sepsis Markers Recent Labs  Lab 02/23/17 1030 02/24/17 0514 02/25/17 0214 02/26/17 0123  LATICACIDVEN 5.7*  --   --   --   PROCALCITON  --  3.29 2.81 2.93    ABG No results for input(s): PHART, PCO2ART, PO2ART in the last 168 hours.  Liver Enzymes Recent Labs  Lab 02/23/17 1015 02/26/17 0123  AST 38 258*  ALT 26 48  ALKPHOS 110 43  BILITOT 0.6 0.4  ALBUMIN 4.4 2.3*    Cardiac Enzymes Recent Labs  Lab 02/23/17 1015  TROPONINI 0.14*    Glucose Recent Labs  Lab 02/25/17 0052 02/25/17 0608  02/25/17 1253 02/25/17 1256 02/25/17 1738 02/25/17 1741  GLUCAP 108* 118* 12* 110* 42* 100*    Ct Angio Head W Or Wo Contrast  Addendum Date: 02/26/2017   ADDENDUM REPORT: 02/26/2017 01:16 ADDENDUM: These results were called by telephone at the time of interpretation on 02/26/2017 at 1:14 am to NP MAGADALENE TUKOV , who verbally acknowledged these results. Electronically Signed   By: Mitzi Hansen M.D.   On: 02/26/2017 01:16   Result Date: 02/26/2017 CLINICAL DATA:  61 y/o  M; stroke for follow-up. EXAM: CT ANGIOGRAM OF HEAD AND NECK CT HEAD WITHOUT CONTRAST CT ANGIOGRAPHY CHEST, ABDOMEN AND PELVIS TECHNIQUE: CT angiogram of head and neck was acquired with multi detector CT imaging technique using the standard protocol during bolus administration of intravenous contrast. Multiplanar MIPS were obtained. Multidetector CT imaging through the chest, abdomen and pelvis was performed using the standard protocol during bolus administration of intravenous contrast. Multiplanar reconstructed images and MIPs were obtained and reviewed to evaluate the vascular anatomy. CONTRAST:  75mL ISOVUE-370 IOPAMIDOL (ISOVUE-370) INJECTION 76%, ISOVUE-370 IOPAMIDOL (ISOVUE-370) INJECTION 76% COMPARISON:  07/03/2016 CT angiogram head and neck. 02/23/2017 CT angiogram aorta by femoral. FINDINGS: CT HEAD FINDINGS No large acute infarct, hemorrhage, or focal mass effect. Stable small chronic lacunar infarcts are present within left lentiform nucleus, left anterior insula, and right anterior insula. Stable mild chronic microvascular ischemic changes of white matter. Stable mild brain parenchymal volume loss. Stable calcific atherosclerosis of carotid siphons. Skull is unremarkable. Normal aeration of visualized paranasal sinuses and mastoid air cells. Normal visualized orbits. CTA NECK FINDINGS Aortic arch: Left vertebral artery arises from aortic arch true lumen. Left common carotid artery arises from right  brachiocephalic artery. Bilateral common carotid artery is and the right vertebral artery arise from true lumen. Type A the dissection propagates into the left subclavian artery and the origin of right brachiocephalic artery. Aortic dissection better characterized on the concurrent CT angiogram of chest. Right carotid system: No evidence of dissection, stenosis (50% or greater) or occlusion. Left carotid system: No evidence of dissection, stenosis (50% or greater) or occlusion. Vertebral arteries: Right dominant. No evidence of dissection, stenosis (50% or greater) or occlusion. Left vertebral artery origin arises from the true lumen of dissection. Skeleton: Mild cervical spondylosis with multilevel disc and facet degenerative changes. No high-grade bony canal stenosis. Disc and facet disease results in multifactorial at least moderate canal stenosis at the C4-5 level. Other neck: Bilateral central venous catheters are present extending into the SVC. There are foci of air throughout the subcutaneous fat and edema at the sites of catheter insertion which is likely post procedural. No lymphadenopathy or mass identified. No effacement of the aerodigestive tract. Upper chest: As below. Review of the MIP images confirms the above findings CTA HEAD FINDINGS Anterior circulation: No significant stenosis, proximal occlusion, aneurysm, or vascular malformation. Posterior circulation: No significant stenosis, proximal occlusion, aneurysm, or vascular malformation. Venous sinuses: As permitted by contrast timing, patent. Anatomic variants: Bilateral posterior communicating arteries and anterior communicating artery present. Delayed phase: No abnormal intracranial enhancement. Review of the MIP images confirms the above findings CTA CHEST FINDINGS Cardiovascular: 4.2 cm ascending aortic aneurysm. Type A aortic dissection involving the origin of right brachiocephalic artery and extending into the left subclavian artery. The left  subclavian artery dissection flap ends before the axillary artery. The aortic dissection flap terminates just below the brachiocephalic artery origin approximately 7 cm from the sino-tubular junction in the ascending segment and does not extend into the aortic root. The flap within the descending aorta extends to the aortic bifurcation in the lower abdomen. Mild cardiomegaly. No pericardial effusion. Normal caliber main pulmonary artery. No central or lobar pulmonary embolus. Mediastinum/Nodes: No enlarged mediastinal, hilar, or axillary lymph nodes. Thyroid gland, trachea, and esophagus demonstrate no significant findings. Lungs/Pleura: Mild centrilobular emphysema with upper lobe predominance. Minor bibasilar atelectasis of lower lobes. No consolidation, effusion, or pneumothorax. Musculoskeletal: No chest wall abnormality. No acute or significant osseous findings. Review of the MIP images confirms the above findings. CTA ABDOMEN AND PELVIS FINDINGS VASCULAR Aorta: Type A aortic dissection extends from the ascending segment of the thoracic aorta to the bifurcation of the abdominal aorta. The false lumen of the dissection through the majority of descending thoracic aorta and abdominal aorta is largely collapsed against the wall of the aorta. A small false lumen is present within the infrarenal segment. Celiac: Patent without evidence of aneurysm, dissection, vasculitis or significant stenosis. True lumen origin. Dissection flap extends to the origin but does not propagate into the artery. SMA: Patent without evidence of aneurysm, dissection, vasculitis  or significant stenosis. True lumen origin. Dissection flap extends to the origin but does not propagate into the artery. Renals: Right renal artery occlusion and a non enhancement of right kidney. Patent left renal artery arising from true lumen. There appears to be improved patency of the mid segment of left renal artery. IMA: Patent without evidence of aneurysm,  dissection, vasculitis or significant stenosis. True lumen origin. Inflow: Stented bilateral common iliac artery's are patent as is the runoff into the lower extremities and pelvis. Veins: No obvious venous abnormality within the limitations of this arterial phase study. Review of the MIP images confirms the above findings. NON-VASCULAR Hepatobiliary: No focal liver abnormality is seen. No gallstones, gallbladder wall thickening, or biliary dilatation. Vicarious excretion of contrast into the gallbladder. Pancreas: Unremarkable. No pancreatic ductal dilatation or surrounding inflammatory changes. Spleen: Normal in size without focal abnormality. Adrenals/Urinary Tract: Normal adrenal glands. Occlusion of right renal artery and nonenhancement of right kidney is stable. Multiple small cortical nonenhancing defects are present within the left kidney likely representing areas of infarction (less than 10% renal volume). No hydronephrosis. Normal bladder. Foley catheter in situ within the bladder. Stomach/Bowel: Stomach is within normal limits. Appendix appears normal. No evidence of bowel wall thickening, distention, or inflammatory changes. Lymphatic: No lymphadenopathy. Reproductive: Prostate is unremarkable. Other: Large volume of low-attenuation fluid within the right retroperitoneum, probably edema related the kidney infarction. Musculoskeletal: No fracture is seen. Review of the MIP images confirms the above findings. IMPRESSION: CT head: 1. No acute intracranial abnormality or abnormal enhancement. 2. Stable chronic microvascular ischemic changes and parenchymal volume loss of the brain. CTA head: No large vessel occlusion, aneurysm, or significant stenosis is identified. CTA neck: 1. Patent carotid and vertebral arteries. No dissection, aneurysm, or significant stenosis by NASCET criteria is identified. 2. Bilateral central venous catheters extending into the SVC. Edema and small volume of air in soft tissues at  catheter insertions is likely post procedural. 3. Cervical spondylosis with at least moderate multifactorial C4-5 canal stenosis. CTA chest, abdomen, and pelvis for dissection: 1. Type A aortic dissection arising approximately 7 cm downstream to the sino-tubular junction and extending to the aortic bifurcation. The false lumen is largely collapsed throughout the majority of descending thoracic and abdominal segments. 2. Dissection flap extends into right brachiocephalic artery and the left subclavian artery. No involvement of the carotid or vertebral artery origins. 3. 4.2 cm ascending aortic aneurysm. 4. Stable occlusion of right renal artery and nonenhancement of right kidney. 5. 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 Improved patency of left renal artery. 6. Patent celiac axis, SMA, IMA. Dissection flap extends to the origins of celiac axis and SMA without propagation into the vessels. 7. Patent stented bilateral common iliac arteries and visible runoff. 8. Edema and right retroperitoneum likely related to renal infarction. 9. Mild centrilobular emphysema of the lungs. Electronically Signed: By: Mitzi Hansen M.D. On: 02/26/2017 00:28   Ct Angio Neck W Or Wo Contrast  Addendum Date: 02/26/2017   ADDENDUM REPORT: 02/26/2017 01:16 ADDENDUM: These results were called by telephone at the time of interpretation on 02/26/2017 at 1:14 am to NP MAGADALENE TUKOV , who verbally acknowledged these results. Electronically Signed   By: Mitzi Hansen M.D.   On: 02/26/2017 01:16   Result Date: 02/26/2017 CLINICAL DATA:  61 y/o  M; stroke for follow-up. EXAM: CT ANGIOGRAM OF HEAD AND NECK CT HEAD WITHOUT CONTRAST CT ANGIOGRAPHY CHEST, ABDOMEN AND PELVIS TECHNIQUE: CT angiogram  of head and neck was acquired with multi detector CT imaging technique using the standard protocol during bolus administration of intravenous contrast. Multiplanar MIPS were obtained.  Multidetector CT imaging through the chest, abdomen and pelvis was performed using the standard protocol during bolus administration of intravenous contrast. Multiplanar reconstructed images and MIPs were obtained and reviewed to evaluate the vascular anatomy. CONTRAST:  75mL ISOVUE-370 IOPAMIDOL (ISOVUE-370) INJECTION 76%, ISOVUE-370 IOPAMIDOL (ISOVUE-370) INJECTION 76% COMPARISON:  07/03/2016 CT angiogram head and neck. 02/23/2017 CT angiogram aorta by femoral. FINDINGS: CT HEAD FINDINGS No large acute infarct, hemorrhage, or focal mass effect. Stable small chronic lacunar infarcts are present within left lentiform nucleus, left anterior insula, and right anterior insula. Stable mild chronic microvascular ischemic changes of white matter. Stable mild brain parenchymal volume loss. Stable calcific atherosclerosis of carotid siphons. Skull is unremarkable. Normal aeration of visualized paranasal sinuses and mastoid air cells. Normal visualized orbits. CTA NECK FINDINGS Aortic arch: Left vertebral artery arises from aortic arch true lumen. Left common carotid artery arises from right brachiocephalic artery. Bilateral common carotid artery is and the right vertebral artery arise from true lumen. Type A the dissection propagates into the left subclavian artery and the origin of right brachiocephalic artery. Aortic dissection better characterized on the concurrent CT angiogram of chest. Right carotid system: No evidence of dissection, stenosis (50% or greater) or occlusion. Left carotid system: No evidence of dissection, stenosis (50% or greater) or occlusion. Vertebral arteries: Right dominant. No evidence of dissection, stenosis (50% or greater) or occlusion. Left vertebral artery origin arises from the true lumen of dissection. Skeleton: Mild cervical spondylosis with multilevel disc and facet degenerative changes. No high-grade bony canal stenosis. Disc and facet disease results in multifactorial at least  moderate canal stenosis at the C4-5 level. Other neck: Bilateral central venous catheters are present extending into the SVC. There are foci of air throughout the subcutaneous fat and edema at the sites of catheter insertion which is likely post procedural. No lymphadenopathy or mass identified. No effacement of the aerodigestive tract. Upper chest: As below. Review of the MIP images confirms the above findings CTA HEAD FINDINGS Anterior circulation: No significant stenosis, proximal occlusion, aneurysm, or vascular malformation. Posterior circulation: No significant stenosis, proximal occlusion, aneurysm, or vascular malformation. Venous sinuses: As permitted by contrast timing, patent. Anatomic variants: Bilateral posterior communicating arteries and anterior communicating artery present. Delayed phase: No abnormal intracranial enhancement. Review of the MIP images confirms the above findings CTA CHEST FINDINGS Cardiovascular: 4.2 cm ascending aortic aneurysm. Type A aortic dissection involving the origin of right brachiocephalic artery and extending into the left subclavian artery. The left subclavian artery dissection flap ends before the axillary artery. The aortic dissection flap terminates just below the brachiocephalic artery origin approximately 7 cm from the sino-tubular junction in the ascending segment and does not extend into the aortic root. The flap within the descending aorta extends to the aortic bifurcation in the lower abdomen. Mild cardiomegaly. No pericardial effusion. Normal caliber main pulmonary artery. No central or lobar pulmonary embolus. Mediastinum/Nodes: No enlarged mediastinal, hilar, or axillary lymph nodes. Thyroid gland, trachea, and esophagus demonstrate no significant findings. Lungs/Pleura: Mild centrilobular emphysema with upper lobe predominance. Minor bibasilar atelectasis of lower lobes. No consolidation, effusion, or pneumothorax. Musculoskeletal: No chest wall abnormality.  No acute or significant osseous findings. Review of the MIP images confirms the above findings. CTA ABDOMEN AND PELVIS FINDINGS VASCULAR Aorta: Type A aortic dissection extends from the ascending segment of the thoracic  aorta to the bifurcation of the abdominal aorta. The false lumen of the dissection through the majority of descending thoracic aorta and abdominal aorta is largely collapsed against the wall of the aorta. A small false lumen is present within the infrarenal segment. Celiac: Patent without evidence of aneurysm, dissection, vasculitis or significant stenosis. True lumen origin. Dissection flap extends to the origin but does not propagate into the artery. SMA: Patent without evidence of aneurysm, dissection, vasculitis or significant stenosis. True lumen origin. Dissection flap extends to the origin but does not propagate into the artery. Renals: Right renal artery occlusion and a non enhancement of right kidney. Patent left renal artery arising from true lumen. There appears to be improved patency of the mid segment of left renal artery. IMA: Patent without evidence of aneurysm, dissection, vasculitis or significant stenosis. True lumen origin. Inflow: Stented bilateral common iliac artery's are patent as is the runoff into the lower extremities and pelvis. Veins: No obvious venous abnormality within the limitations of this arterial phase study. Review of the MIP images confirms the above findings. NON-VASCULAR Hepatobiliary: No focal liver abnormality is seen. No gallstones, gallbladder wall thickening, or biliary dilatation. Vicarious excretion of contrast into the gallbladder. Pancreas: Unremarkable. No pancreatic ductal dilatation or surrounding inflammatory changes. Spleen: Normal in size without focal abnormality. Adrenals/Urinary Tract: Normal adrenal glands. Occlusion of right renal artery and nonenhancement of right kidney is stable. Multiple small cortical nonenhancing defects are present  within the left kidney likely representing areas of infarction (less than 10% renal volume). No hydronephrosis. Normal bladder. Foley catheter in situ within the bladder. Stomach/Bowel: Stomach is within normal limits. Appendix appears normal. No evidence of bowel wall thickening, distention, or inflammatory changes. Lymphatic: No lymphadenopathy. Reproductive: Prostate is unremarkable. Other: Large volume of low-attenuation fluid within the right retroperitoneum, probably edema related the kidney infarction. Musculoskeletal: No fracture is seen. Review of the MIP images confirms the above findings. IMPRESSION: CT head: 1. No acute intracranial abnormality or abnormal enhancement. 2. Stable chronic microvascular ischemic changes and parenchymal volume loss of the brain. CTA head: No large vessel occlusion, aneurysm, or significant stenosis is identified. CTA neck: 1. Patent carotid and vertebral arteries. No dissection, aneurysm, or significant stenosis by NASCET criteria is identified. 2. Bilateral central venous catheters extending into the SVC. Edema and small volume of air in soft tissues at catheter insertions is likely post procedural. 3. Cervical spondylosis with at least moderate multifactorial C4-5 canal stenosis. CTA chest, abdomen, and pelvis for dissection: 1. Type A aortic dissection arising approximately 7 cm downstream to the sino-tubular junction and extending to the aortic bifurcation. The false lumen is largely collapsed throughout the majority of descending thoracic and abdominal segments. 2. Dissection flap extends into right brachiocephalic artery and the left subclavian artery. No involvement of the carotid or vertebral artery origins. 3. 4.2 cm ascending aortic aneurysm. 4. Stable occlusion of right renal artery and nonenhancement of right kidney. 5. 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 Improved patency of left renal artery. 6. Patent celiac axis, SMA,  IMA. Dissection flap extends to the origins of celiac axis and SMA without propagation into the vessels. 7. Patent stented bilateral common iliac arteries and visible runoff. 8. Edema and right retroperitoneum likely related to renal infarction. 9. Mild centrilobular emphysema of the lungs. Electronically Signed: By: Mitzi HansenLance  Furusawa-Stratton M.D. On: 02/26/2017 00:28   Mr Cervical Spine Wo Contrast  Result Date: 02/26/2017 CLINICAL DATA:  61 y/o  M; loss of function of legs. EXAM: MRI CERVICAL SPINE WITHOUT CONTRAST TECHNIQUE: Multiplanar, multisequence MR imaging of the cervical spine was performed. No intravenous contrast was administered. COMPARISON:  02/17/2017 CT angiogram of head, neck, chest, abdomen, and pelvis. FINDINGS: Alignment: Straightening of cervical lordosis, no listhesis. Vertebrae: No fracture, evidence of discitis, or bone lesion. Cord: Increased cord signal within right cord at C4-5 level. Otherwise no abnormal cord signal. Posterior Fossa, vertebral arteries, paraspinal tissues: Negative. Disc levels: C2-3: No significant disc displacement, foraminal stenosis, or canal stenosis. C3-4: Disc osteophyte complex with right greater than left uncovertebral and facet hypertrophy. Mild right foraminal stenosis and mild canal stenosis. C4-5: Large disc osteophyte complex eccentric to the right with right greater than left uncovertebral and facet hypertrophy. Severe canal stenosis with for greater than left anterior cord compression. Severe right and mild left foraminal stenosis. C5-6: Disc osteophyte complex with right greater than left uncovertebral and facet hypertrophy. Severe right and moderate left foraminal stenosis. Moderate canal stenosis. C6-7: Disc osteophyte complex with bilateral uncovertebral and facet hypertrophy. Mild bilateral foraminal stenosis. Mild canal stenosis. C7-T1: Left-greater-than-right uncovertebral and facet hypertrophy. Mild right and severe left foraminal stenosis.  IMPRESSION: 1. No acute osseous abnormality.  Advanced cervical spondylosis. 2. 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 3. Multiple levels of mild canal stenosis and moderate C5-6 canal stenosis. 4. Multilevel mild and moderate foraminal stenosis. Severe right C4-5, severe right C5-6, and severe left C7-T1 foraminal stenosis. These results were called by telephone at the time of interpretation on 02/26/2017 at 1:14 am to NP MAGADALENE TUKOV , who verbally acknowledged these results. Electronically Signed   By: Mitzi Hansen M.D.   On: 02/26/2017 01:14   Mr Lumbar Spine Wo Contrast  Result Date: 02/26/2017 CLINICAL DATA:  61 y/o  M; loss of motor function in the legs. EXAM: MRI LUMBAR SPINE WITHOUT CONTRAST TECHNIQUE: Multiplanar, multisequence MR imaging of the lumbar spine was performed. No intravenous contrast was administered. COMPARISON:  Concurrent cervical spine MRI. CT angiogram of chest abdomen and pelvis. FINDINGS: Segmentation:  Standard. Alignment:  Physiologic. Vertebrae:  No fracture, evidence of discitis, or bone lesion. Conus medullaris and cauda equina: Conus extends to the L2 level. Diffusely increased T2 signal within the visible lower thoracic cord and conus medullaris. Normal appearance of cauda equina. Paraspinal and other soft tissues: Increased signal of right kidney, likely related to renal infarction as seen on CT. Dissection flap within the infrarenal abdominal aorta. Increased T2 signal throughout the right psoas muscle with swelling, no discrete mass, suspected myositis, possibly ischemic given additional areas of ischemia. Disc levels: L1-2: No significant disc displacement, foraminal stenosis, or canal stenosis. L2-3: No significant disc displacement, foraminal stenosis, or canal stenosis. L3-4: Minimal disc bulge. No significant foraminal or canal stenosis. L4-5: Small disc bulge and right subarticular disc  protrusion with annular fissure. Mild bilateral foraminal stenosis. Disc protrusion contacts descending right L5 nerve root in lateral recess. L5-S1: Small disc bulge with small central disc protrusion. Mild bilateral foraminal stenosis. No significant canal stenosis. IMPRESSION: 1. Diffusely increased signal within lower thoracic cord and conus medullaris. Findings likely represent acute myelitis, probably ischemic given aortic dissection and multiple vessel thromboses in the abdomen. 2. Mild lumbar spondylosis. No high-grade foraminal or canal stenosis. 3. Swelling and increased T2 signal of right psoas muscle, probably myositis. 4. Increased T2 signal of the right kidney, renal infarct as seen on CT. These results were called by telephone at  the time of interpretation on 02/26/2017 at 1:15 am to NP MAGADALENE TUKOV , who verbally acknowledged these results. Electronically Signed   By: Mitzi Hansen M.D.   On: 02/26/2017 01:15   Dg Chest Port 1 View  Result Date: 02/25/2017 CLINICAL DATA:  Shortness of Breath EXAM: PORTABLE CHEST 1 VIEW COMPARISON:  02/23/2017 FINDINGS: Cardiac shadow is within normal limits. The lungs are well aerated bilaterally. No focal infiltrate or sizable effusion is seen. No pneumothorax is noted. New dialysis catheter is noted on the left in satisfactory position. Right jugular central line is stable in appearance. No bony abnormality is noted. IMPRESSION: Status post dialysis catheter placement without complicating factors. Electronically Signed   By: Alcide Clever M.D.   On: 02/25/2017 12:35   Ct Angio Chest/abd/pel For Dissection W And/or W/wo  Addendum Date: 02/26/2017   ADDENDUM REPORT: 02/26/2017 01:16 ADDENDUM: These results were called by telephone at the time of interpretation on 02/26/2017 at 1:14 am to NP MAGADALENE TUKOV , who verbally acknowledged these results. Electronically Signed   By: Mitzi Hansen M.D.   On: 02/26/2017 01:16   Result  Date: 02/26/2017 CLINICAL DATA:  61 y/o  M; stroke for follow-up. EXAM: CT ANGIOGRAM OF HEAD AND NECK CT HEAD WITHOUT CONTRAST CT ANGIOGRAPHY CHEST, ABDOMEN AND PELVIS TECHNIQUE: CT angiogram of head and neck was acquired with multi detector CT imaging technique using the standard protocol during bolus administration of intravenous contrast. Multiplanar MIPS were obtained. Multidetector CT imaging through the chest, abdomen and pelvis was performed using the standard protocol during bolus administration of intravenous contrast. Multiplanar reconstructed images and MIPs were obtained and reviewed to evaluate the vascular anatomy. CONTRAST:  75mL ISOVUE-370 IOPAMIDOL (ISOVUE-370) INJECTION 76%, ISOVUE-370 IOPAMIDOL (ISOVUE-370) INJECTION 76% COMPARISON:  07/03/2016 CT angiogram head and neck. 02/23/2017 CT angiogram aorta by femoral. FINDINGS: CT HEAD FINDINGS No large acute infarct, hemorrhage, or focal mass effect. Stable small chronic lacunar infarcts are present within left lentiform nucleus, left anterior insula, and right anterior insula. Stable mild chronic microvascular ischemic changes of white matter. Stable mild brain parenchymal volume loss. Stable calcific atherosclerosis of carotid siphons. Skull is unremarkable. Normal aeration of visualized paranasal sinuses and mastoid air cells. Normal visualized orbits. CTA NECK FINDINGS Aortic arch: Left vertebral artery arises from aortic arch true lumen. Left common carotid artery arises from right brachiocephalic artery. Bilateral common carotid artery is and the right vertebral artery arise from true lumen. Type A the dissection propagates into the left subclavian artery and the origin of right brachiocephalic artery. Aortic dissection better characterized on the concurrent CT angiogram of chest. Right carotid system: No evidence of dissection, stenosis (50% or greater) or occlusion. Left carotid system: No evidence of dissection, stenosis (50% or  greater) or occlusion. Vertebral arteries: Right dominant. No evidence of dissection, stenosis (50% or greater) or occlusion. Left vertebral artery origin arises from the true lumen of dissection. Skeleton: Mild cervical spondylosis with multilevel disc and facet degenerative changes. No high-grade bony canal stenosis. Disc and facet disease results in multifactorial at least moderate canal stenosis at the C4-5 level. Other neck: Bilateral central venous catheters are present extending into the SVC. There are foci of air throughout the subcutaneous fat and edema at the sites of catheter insertion which is likely post procedural. No lymphadenopathy or mass identified. No effacement of the aerodigestive tract. Upper chest: As below. Review of the MIP images confirms the above findings CTA HEAD FINDINGS Anterior circulation: No significant stenosis, proximal  occlusion, aneurysm, or vascular malformation. Posterior circulation: No significant stenosis, proximal occlusion, aneurysm, or vascular malformation. Venous sinuses: As permitted by contrast timing, patent. Anatomic variants: Bilateral posterior communicating arteries and anterior communicating artery present. Delayed phase: No abnormal intracranial enhancement. Review of the MIP images confirms the above findings CTA CHEST FINDINGS Cardiovascular: 4.2 cm ascending aortic aneurysm. Type A aortic dissection involving the origin of right brachiocephalic artery and extending into the left subclavian artery. The left subclavian artery dissection flap ends before the axillary artery. The aortic dissection flap terminates just below the brachiocephalic artery origin approximately 7 cm from the sino-tubular junction in the ascending segment and does not extend into the aortic root. The flap within the descending aorta extends to the aortic bifurcation in the lower abdomen. Mild cardiomegaly. No pericardial effusion. Normal caliber main pulmonary artery. No central or  lobar pulmonary embolus. Mediastinum/Nodes: No enlarged mediastinal, hilar, or axillary lymph nodes. Thyroid gland, trachea, and esophagus demonstrate no significant findings. Lungs/Pleura: Mild centrilobular emphysema with upper lobe predominance. Minor bibasilar atelectasis of lower lobes. No consolidation, effusion, or pneumothorax. Musculoskeletal: No chest wall abnormality. No acute or significant osseous findings. Review of the MIP images confirms the above findings. CTA ABDOMEN AND PELVIS FINDINGS VASCULAR Aorta: Type A aortic dissection extends from the ascending segment of the thoracic aorta to the bifurcation of the abdominal aorta. The false lumen of the dissection through the majority of descending thoracic aorta and abdominal aorta is largely collapsed against the wall of the aorta. A small false lumen is present within the infrarenal segment. Celiac: Patent without evidence of aneurysm, dissection, vasculitis or significant stenosis. True lumen origin. Dissection flap extends to the origin but does not propagate into the artery. SMA: Patent without evidence of aneurysm, dissection, vasculitis or significant stenosis. True lumen origin. Dissection flap extends to the origin but does not propagate into the artery. Renals: Right renal artery occlusion and a non enhancement of right kidney. Patent left renal artery arising from true lumen. There appears to be improved patency of the mid segment of left renal artery. IMA: Patent without evidence of aneurysm, dissection, vasculitis or significant stenosis. True lumen origin. Inflow: Stented bilateral common iliac artery's are patent as is the runoff into the lower extremities and pelvis. Veins: No obvious venous abnormality within the limitations of this arterial phase study. Review of the MIP images confirms the above findings. NON-VASCULAR Hepatobiliary: No focal liver abnormality is seen. No gallstones, gallbladder wall thickening, or biliary dilatation.  Vicarious excretion of contrast into the gallbladder. Pancreas: Unremarkable. No pancreatic ductal dilatation or surrounding inflammatory changes. Spleen: Normal in size without focal abnormality. Adrenals/Urinary Tract: Normal adrenal glands. Occlusion of right renal artery and nonenhancement of right kidney is stable. Multiple small cortical nonenhancing defects are present within the left kidney likely representing areas of infarction (less than 10% renal volume). No hydronephrosis. Normal bladder. Foley catheter in situ within the bladder. Stomach/Bowel: Stomach is within normal limits. Appendix appears normal. No evidence of bowel wall thickening, distention, or inflammatory changes. Lymphatic: No lymphadenopathy. Reproductive: Prostate is unremarkable. Other: Large volume of low-attenuation fluid within the right retroperitoneum, probably edema related the kidney infarction. Musculoskeletal: No fracture is seen. Review of the MIP images confirms the above findings. IMPRESSION: CT head: 1. No acute intracranial abnormality or abnormal enhancement. 2. Stable chronic microvascular ischemic changes and parenchymal volume loss of the brain. CTA head: No large vessel occlusion, aneurysm, or significant stenosis is identified. CTA neck: 1. Patent carotid and vertebral  arteries. No dissection, aneurysm, or significant stenosis by NASCET criteria is identified. 2. Bilateral central venous catheters extending into the SVC. Edema and small volume of air in soft tissues at catheter insertions is likely post procedural. 3. Cervical spondylosis with at least moderate multifactorial C4-5 canal stenosis. CTA chest, abdomen, and pelvis for dissection: 1. Type A aortic dissection arising approximately 7 cm downstream to the sino-tubular junction and extending to the aortic bifurcation. The false lumen is largely collapsed throughout the majority of descending thoracic and abdominal segments. 2. Dissection flap extends into  right brachiocephalic artery and the left subclavian artery. No involvement of the carotid or vertebral artery origins. 3. 4.2 cm ascending aortic aneurysm. 4. Stable occlusion of right renal artery and nonenhancement of right kidney. 5. 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 Improved patency of left renal artery. 6. Patent celiac axis, SMA, IMA. Dissection flap extends to the origins of celiac axis and SMA without propagation into the vessels. 7. Patent stented bilateral common iliac arteries and visible runoff. 8. Edema and right retroperitoneum likely related to renal infarction. 9. Mild centrilobular emphysema of the lungs. Electronically Signed: By: Mitzi Hansen M.D. 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 Extensive embolization to renal arteries and lower extremities Sinus tachycardia P:  Vascular Surgery recommends transfer to [facility] for further evaluation and treatment MAP goal > 55 mmHg; SBP 90-120 Labetalol prn to keep HR and BP within parameters Hemodynamic monitoring per ICU protocol  RENAL A:   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 P:   Monitor BMET intermittently Monitor I/Os Correct electrolytes as indicated Nephrology following HD initiated 12/28  GASTROINTESTINAL A:   Abdominal pain -likely due to vascular insufficiency P:   SUP: IV famotidine Renal/carb modified diet as tolerated  HEMATOLOGIC A:   Acute blood loss anemia-Current hemoglobin 5.4g/dl Thrombocytopenia Apparent thrombophilia -unclear if this has been evaluated by Hematology P:  DVT px: full  dose heparin Monitor CBC intermittently Transfuse 2 units of PRBCs Timing of resumption of heparin per vascular surgery Will need lifelong anticoagulation Would consider Hematology consultation at some point  INFECTIOUS A:   Pyuria P:   Monitor temp, WBC count Micro and abx as above   ENDOCRINE A:   No issues P:   Monitor glucose on chem panels  NEUROLOGIC A:   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 Myra Gianotti, Dr. Vivien Rota and Dr, Deterding.  Patient's sister updated on on MRI and CTA findings and  pending transfer to [facility]  Allergies as of 02/26/2017   No Known Allergies     Medication List    STOP taking these medications   amLODipine 5 MG tablet Commonly known as:  NORVASC   apixaban 5 MG Tabs tablet Commonly known as:  ELIQUIS   lisinopril 20 MG tablet Commonly known as:  PRINIVIL,ZESTRIL   metoprolol tartrate 25 MG tablet Commonly known as:  LOPRESSOR     TAKE these medications   alum & mag hydroxide-simeth 200-200-20 MG/5ML suspension  Commonly known as:  MAALOX/MYLANTA Take 15-30 mLs by mouth every 2 (two) hours as needed for indigestion.   atorvastatin 20 MG tablet Commonly known as:  LIPITOR Take 1 tablet (20 mg total) by mouth daily at 8 pm. What changed:    medication strength  See the new instructions.   budesonide 0.25 MG/2ML nebulizer solution Commonly known as:  PULMICORT Take 2 mLs (0.25 mg total) by nebulization every 6 (six) hours.   cefTRIAXone 1 g in dextrose 5 % 50 mL Inject 1 g into the vein daily.   docusate sodium 100 MG capsule Commonly known as:  COLACE Take 1 capsule (100 mg total) by mouth daily.   famotidine 20-0.9 MG/50ML-% Commonly known as:  PEPCID Inject 50 mLs (20 mg total) into the vein daily.   Influenza vac split  quadrivalent PF 0.5 ML injection Commonly known as:  FLUARIX Inject 0.5 mLs into the muscle tomorrow at 10 am for 1 dose.   ipratropium-albuterol 0.5-2.5 (3) MG/3ML Soln Commonly known as:  DUONEB Take 3 mLs by nebulization every 6 (six) hours.   labetalol 5 MG/ML injection Commonly known as:  NORMODYNE,TRANDATE Inject 1-4 mLs (5-20 mg total) into the vein every 30 (thirty) minutes as needed (Until SBP < 120 mmHg and KEEP HR 60-70).   LORazepam 2 MG/ML injection Commonly known as:  ATIVAN Inject 0.25 mLs (0.5 mg total) into the vein every 4 (four) hours as needed for anxiety.   morphine 2 MG/ML injection Inject 1-2.5 mLs (2-5 mg total) into the vein every hour as needed.   ondansetron 4 MG/2ML Soln injection Commonly known as:  ZOFRAN Inject 2 mLs (4 mg total) into the vein every 6 (six) hours as needed for nausea or vomiting.   oxyCODONE-acetaminophen 5-325 MG tablet Commonly known as:  PERCOCET/ROXICET Take 1-2 tablets by mouth every 4 (four) hours as needed for moderate pain.   phenol 1.4 % Liqd Commonly known as:  CHLORASEPTIC Use as directed 1 spray in the mouth or throat as needed for throat irritation / pain.   potassium chloride SA 20 MEQ tablet Commonly known as:  K-DUR,KLOR-CON Take 1-2 tablets (20-40 mEq total) by mouth daily as needed (low potassium level).   senna-docusate 8.6-50 MG tablet Commonly known as:  Senokot-S Take 1 tablet by mouth at bedtime as needed for mild constipation.   sorbitol 70 % Soln Take 30 mLs by mouth daily as needed for moderate constipation.       Chanette Demo S. [facility] ANP-BC Pulmonary and Critical Care Medicine [facility] Pager [phone] or [phone] 02/26/2017 3:34 AM

## 2017-02-26 NOTE — Progress Notes (Signed)
Report given to [facility] transportation. They are on their way to pick up the patient at this time.

## 2017-02-27 LAB — TYPE AND SCREEN
ABO/RH(D): A POS
Antibody Screen: NEGATIVE
UNIT DIVISION: 0
UNIT DIVISION: 0
UNIT DIVISION: 0
Unit division: 0

## 2017-02-27 LAB — BPAM RBC
BLOOD PRODUCT EXPIRATION DATE: 201901252359
BLOOD PRODUCT EXPIRATION DATE: 201901252359
Blood Product Expiration Date: 201901132359
Blood Product Expiration Date: 201901132359
ISSUE DATE / TIME: 201812271820
ISSUE DATE / TIME: 201812272158
ISSUE DATE / TIME: 201812290216
UNIT TYPE AND RH: 6200
Unit Type and Rh: 6200
Unit Type and Rh: 6200
Unit Type and Rh: 6200

## 2017-02-28 DIAGNOSIS — Q2112 Patent foramen ovale: Secondary | ICD-10-CM | POA: Insufficient documentation

## 2017-02-28 DIAGNOSIS — Q211 Atrial septal defect: Secondary | ICD-10-CM | POA: Insufficient documentation

## 2017-02-28 LAB — GLUCOSE, CAPILLARY
GLUCOSE-CAPILLARY: 12 mg/dL — AB (ref 65–99)
GLUCOSE-CAPILLARY: 42 mg/dL — AB (ref 65–99)

## 2017-02-28 LAB — PROTEIN ELECTROPHORESIS, SERUM
A/G Ratio: 1.3 (ref 0.7–1.7)
Albumin ELP: 2.7 g/dL — ABNORMAL LOW (ref 2.9–4.4)
Alpha-1-Globulin: 0.3 g/dL (ref 0.0–0.4)
Alpha-2-Globulin: 0.6 g/dL (ref 0.4–1.0)
Beta Globulin: 0.6 g/dL — ABNORMAL LOW (ref 0.7–1.3)
GAMMA GLOBULIN: 0.6 g/dL (ref 0.4–1.8)
GLOBULIN, TOTAL: 2.1 g/dL — AB (ref 2.2–3.9)
TOTAL PROTEIN ELP: 4.8 g/dL — AB (ref 6.0–8.5)

## 2017-03-02 LAB — ANCA TITERS
Atypical P-ANCA titer: <1:20 {titer}
C-ANCA: <1:20 {titer}
P-ANCA: <1:20 {titer}

## 2017-03-02 MED ORDER — DOCUSATE SODIUM 100 MG PO CAPS
100.00 mg | ORAL_CAPSULE | ORAL | Status: DC
Start: 2017-03-02 — End: 2017-03-02

## 2017-03-02 MED ORDER — ACETAMINOPHEN 325 MG PO TABS
650.00 mg | ORAL_TABLET | ORAL | Status: DC
Start: ? — End: 2017-03-02

## 2017-03-02 MED ORDER — METOPROLOL TARTRATE 25 MG PO TABS
25.00 mg | ORAL_TABLET | ORAL | Status: DC
Start: 2017-03-02 — End: 2017-03-02

## 2017-03-02 MED ORDER — POLYETHYLENE GLYCOL 3350 17 G PO PACK
17.00 | PACK | ORAL | Status: DC
Start: 2017-03-03 — End: 2017-03-02

## 2017-03-02 MED ORDER — GENERIC EXTERNAL MEDICATION
Status: DC
Start: ? — End: 2017-03-02

## 2017-03-02 MED ORDER — HEPARIN SODIUM (PORCINE) 5000 UNIT/ML IJ SOLN
5000.00 | INTRAMUSCULAR | Status: DC
Start: 2017-03-02 — End: 2017-03-02

## 2017-03-02 MED ORDER — ONDANSETRON HCL 4 MG/2ML IJ SOLN
4.00 mg | INTRAMUSCULAR | Status: DC
Start: ? — End: 2017-03-02

## 2017-03-02 MED ORDER — HYDRALAZINE HCL 20 MG/ML IJ SOLN
5.00 mg | INTRAMUSCULAR | Status: DC
Start: ? — End: 2017-03-02

## 2017-03-02 MED ORDER — ACETAMINOPHEN 650 MG RE SUPP
650.00 mg | RECTAL | Status: DC
Start: ? — End: 2017-03-02

## 2017-03-03 ENCOUNTER — Ambulatory Visit: Payer: Self-pay

## 2017-03-03 LAB — PROTEIN ELECTRO, RANDOM URINE
ALPHA-1-GLOBULIN, U: 7.3 %
Albumin ELP, Urine: 23 %
Alpha-2-Globulin, U: 20.2 %
Beta Globulin, U: 21.9 %
Gamma Globulin, U: 27.6 %
M SPIKE UR: 18.2 % — AB
TOTAL PROTEIN, URINE-UPE24: 61.5 mg/dL

## 2017-03-04 ENCOUNTER — Encounter: Payer: Self-pay | Admitting: Vascular Surgery

## 2017-03-09 ENCOUNTER — Ambulatory Visit: Payer: Self-pay | Admitting: Ophthalmology

## 2017-03-10 ENCOUNTER — Ambulatory Visit: Payer: Self-pay

## 2017-03-17 NOTE — Care Management (Signed)
03/17/2017 note entry regarding call from [place]: This RNCM received call from Dionicio Stall Director of Care management and Patient logistics at [facility].  [phone] or [phone]. She is requesting that [facility] take patient back and assume care of this patient.  She states that [facility] hospitalist have already declined assumption of care.  She explained that it was assumed by [facility] that we would take patient back. Patient was transferred to [facility] on 02/23/17 per Lelan Pons, by Harless Nakayama "that agreed to take patient back".  I explained that our hospitalists admit such patients in the event this need arises however Ms. Patria Mane does not have admission privileges. I did tell Lelan Pons that I would reach out to our Physician Advisor, which I did (Dr. Fulton Reek) and my director [facility]. Patient was transferred to [facility] as [facility] could not meet his medical needs. I have been advised by both [facility] and Dr. Fulton Reek, that we will respect the decision made by our admitting hospitalist to not accept this patient as we would not be doing anything any differently than [facility]. I have updated Lelan Pons and shared her concern with meeting patient's needs however those needs can be met at his current location- [facility].

## 2017-04-07 ENCOUNTER — Other Ambulatory Visit: Payer: Self-pay | Admitting: Internal Medicine

## 2017-04-07 ENCOUNTER — Encounter: Payer: Self-pay | Admitting: Pharmacist

## 2017-04-07 ENCOUNTER — Other Ambulatory Visit: Payer: Self-pay | Admitting: Adult Health Nurse Practitioner

## 2017-04-07 DIAGNOSIS — I270 Primary pulmonary hypertension: Secondary | ICD-10-CM

## 2017-04-07 DIAGNOSIS — I70229 Atherosclerosis of native arteries of extremities with rest pain, unspecified extremity: Secondary | ICD-10-CM | POA: Insufficient documentation

## 2017-04-07 DIAGNOSIS — I7101 Dissection of ascending aorta: Secondary | ICD-10-CM | POA: Insufficient documentation

## 2017-04-07 DIAGNOSIS — I998 Other disorder of circulatory system: Secondary | ICD-10-CM | POA: Insufficient documentation

## 2017-04-07 DIAGNOSIS — N179 Acute kidney failure, unspecified: Secondary | ICD-10-CM | POA: Insufficient documentation

## 2017-05-16 ENCOUNTER — Other Ambulatory Visit: Payer: Self-pay | Admitting: Adult Health Nurse Practitioner

## 2017-05-16 ENCOUNTER — Other Ambulatory Visit: Payer: Self-pay | Admitting: Internal Medicine

## 2017-06-09 ENCOUNTER — Telehealth: Payer: Self-pay | Admitting: Pharmacy Technician

## 2017-06-09 NOTE — Telephone Encounter (Signed)
Patient failed to provide 2019 poi.  No additional medication assistance will be provided by [facility] without the required proof of income documentation.  Patient notified by letter.  Betty J. Kluttz Care Manager [facility] 

## 2017-07-19 ENCOUNTER — Telehealth: Payer: Self-pay | Admitting: Pharmacy Technician

## 2017-07-19 NOTE — Telephone Encounter (Signed)
Patient has full Medicaid.  No longer eligible to receive [facility] services.  Pt notified by letter.  Sherilyn Dacosta Care Manager [facility]

## 2018-03-29 ENCOUNTER — Encounter: Payer: Medicaid Other | Attending: Internal Medicine | Admitting: Internal Medicine

## 2018-03-29 ENCOUNTER — Other Ambulatory Visit: Payer: Self-pay | Admitting: Internal Medicine

## 2018-03-29 ENCOUNTER — Ambulatory Visit
Admission: RE | Admit: 2018-03-29 | Discharge: 2018-03-29 | Disposition: A | Discharge status: Home / Self Care | Payer: Medicaid Other | Source: Ambulatory Visit | Attending: Internal Medicine | Admitting: Internal Medicine

## 2018-03-29 ENCOUNTER — Other Ambulatory Visit
Admission: RE | Admit: 2018-03-29 | Discharge: 2018-03-29 | Disposition: A | Discharge status: Home / Self Care | Payer: Medicaid Other | Source: Ambulatory Visit | Attending: Internal Medicine | Admitting: Internal Medicine

## 2018-03-29 DIAGNOSIS — Z8249 Family history of ischemic heart disease and other diseases of the circulatory system: Secondary | ICD-10-CM | POA: Insufficient documentation

## 2018-03-29 DIAGNOSIS — L089 Local infection of the skin and subcutaneous tissue, unspecified: Secondary | ICD-10-CM | POA: Diagnosis present

## 2018-03-29 DIAGNOSIS — Z8673 Personal history of transient ischemic attack (TIA), and cerebral infarction without residual deficits: Secondary | ICD-10-CM | POA: Insufficient documentation

## 2018-03-29 DIAGNOSIS — L89312 Pressure ulcer of right buttock, stage 2: Secondary | ICD-10-CM | POA: Diagnosis not present

## 2018-03-29 DIAGNOSIS — G8221 Paraplegia, complete: Secondary | ICD-10-CM | POA: Insufficient documentation

## 2018-03-29 DIAGNOSIS — Z86718 Personal history of other venous thrombosis and embolism: Secondary | ICD-10-CM | POA: Diagnosis not present

## 2018-03-29 DIAGNOSIS — Z87891 Personal history of nicotine dependence: Secondary | ICD-10-CM | POA: Diagnosis not present

## 2018-03-29 DIAGNOSIS — Q211 Atrial septal defect: Secondary | ICD-10-CM | POA: Insufficient documentation

## 2018-03-29 DIAGNOSIS — I4891 Unspecified atrial fibrillation: Secondary | ICD-10-CM | POA: Insufficient documentation

## 2018-03-29 DIAGNOSIS — T148XXA Other injury of unspecified body region, initial encounter: Secondary | ICD-10-CM | POA: Diagnosis not present

## 2018-03-29 DIAGNOSIS — Z7901 Long term (current) use of anticoagulants: Secondary | ICD-10-CM | POA: Insufficient documentation

## 2018-03-29 DIAGNOSIS — L89154 Pressure ulcer of sacral region, stage 4: Secondary | ICD-10-CM | POA: Diagnosis not present

## 2018-03-29 DIAGNOSIS — I10 Essential (primary) hypertension: Secondary | ICD-10-CM | POA: Insufficient documentation

## 2018-03-30 NOTE — Progress Notes (Signed)
Mitchell Dixon, Mitchell Dixon ([id number]) Visit Report for 03/29/2018 Abuse/Suicide Risk Screen Details Patient Name: Mitchell Dixon, Mitchell Dixon Date of Service: 03/29/2018 8:45 AM Medical Record Number: [id number] Patient Account Number: [id number] Date of Birth/Sex: [birth year] (63 y.o. Male) Treating RN: Curtis Sites Primary Care Pookela Sellin: Karie Fetch Other Clinician: Referring Eston Heslin: Karie Fetch Treating Eamonn Sermeno/Extender: [facility] [place] in Treatment: 0 Abuse/Suicide Risk Screen Items Answer ABUSE/SUICIDE RISK SCREEN: Has anyone close to you tried to hurt or harm you recentlyo No Do you feel uncomfortable with anyone in your familyo No Has anyone forced you do things that you didnot want to doo No Do you have any thoughts of harming yourselfo No Patient displays signs or symptoms of abuse and/or neglect. No Electronic Signature(s) Signed: 03/29/2018 5:19:25 PM By: Curtis Sites Entered By: Curtis Sites on 03/29/2018 08:55:18 Mitchell Dixon ([id number]) -------------------------------------------------------------------------------- Activities of Daily Living Details Patient Name: Mitchell Dixon Date of Service: 03/29/2018 8:45 AM Medical Record Number: [id number] Patient Account Number: [id number] Date of Birth/Sex: 02-17-1956 (63 y.o. Male) Treating RN: Curtis Sites Primary Care Amillion Scobee: Karie Fetch Other Clinician: Referring Davide Risdon: Karie Fetch Treating Humberto Addo/Extender: [facility] [place] in Treatment: 0 Activities of Daily Living Items Answer Activities of Daily Living (Please select one for each item) Drive Automobile Not Able Take Medications Completely Able Use Telephone Completely Able Care for Appearance Need Assistance Use Toilet Need Assistance Bath / Shower Need Assistance Dress Self Need Assistance Feed Self Completely Able Walk Not Able Get In / Out Bed Need Assistance Housework Need Assistance Prepare Meals Need Assistance Handle  Money Completely Able Shop for Self Need Assistance Electronic Signature(s) Signed: 03/29/2018 5:19:25 PM By: Curtis Sites Entered By: Curtis Sites on 03/29/2018 08:56:53 Mitchell Dixon ([id number]) -------------------------------------------------------------------------------- Education Assessment Details Patient Name: Mitchell Dixon Date of Service: 03/29/2018 8:45 AM Medical Record Number: [id number] Patient Account Number: [id number] Date of Birth/Sex: 02/04/1956 (62 y.o. Male) Treating RN: Curtis Sites Primary Care Jaliza Seifried: Karie Fetch Other Clinician: Referring Eyana Stolze: Karie Fetch Treating Alitzel Cookson/Extender: [facility] [place] in Treatment: 0 Primary Learner Assessed: Patient Learning Preferences/Education Level/Primary Language Learning Preference: Explanation, Demonstration Highest Education Level: High School Preferred Language: English Cognitive Barrier Assessment/Beliefs Language Barrier: No Translator Needed: No Memory Deficit: No Emotional Barrier: No Cultural/Religious Beliefs Affecting Medical Care: No Physical Barrier Assessment Impaired Vision: No Impaired Hearing: No Decreased Hand dexterity: No Knowledge/Comprehension Assessment Knowledge Level: Medium Comprehension Level: Medium Ability to understand written Medium instructions: Ability to understand verbal Medium instructions: Motivation Assessment Anxiety Level: Calm Cooperation: Cooperative Education Importance: Acknowledges Need Interest in Health Problems: Asks Questions Perception: Coherent Willingness to Engage in Self- Medium Management Activities: Readiness to Engage in Self- Medium Management Activities: Electronic Signature(s) Signed: 03/29/2018 5:19:25 PM By: Curtis Sites Entered By: Curtis Sites on 03/29/2018 08:55:52 Mitchell Dixon ([id number]) -------------------------------------------------------------------------------- Fall Risk Assessment  Details Patient Name: Mitchell Dixon Date of Service: 03/29/2018 8:45 AM Medical Record Number: [id number] Patient Account Number: [id number] Date of Birth/Sex: Nov 27, 1955 (62 y.o. Male) Treating RN: Curtis Sites Primary Care Cutter Passey: Karie Fetch Other Clinician: Referring Amali Uhls: Karie Fetch Treating Niajah Sipos/Extender: [facility] [place] in Treatment: 0 Fall Risk Assessment Items Have you had 2 or more falls in the last 12 monthso 0 No Have you had any fall that resulted in injury in the last 12 monthso 0 No FALL RISK ASSESSMENT: History of falling - immediate or within 3 months 0 No Secondary diagnosis 0 No Ambulatory aid None/bed rest/wheelchair/nurse 0 Yes Crutches/cane/walker 0 No Furniture 0  No IV Access/Saline Lock 0 No Gait/Training Normal/bed rest/immobile 0 No Weak 10 Yes Impaired 20 Yes Mental Status Oriented to own ability 0 Yes Electronic Signature(s) Signed: 03/29/2018 5:19:25 PM By: Curtis Sites Entered By: Curtis Sites on 03/29/2018 08:56:13 Mitchell Dixon ([id number]) -------------------------------------------------------------------------------- Foot Assessment Details Patient Name: Mitchell Dixon Date of Service: 03/29/2018 8:45 AM Medical Record Number: [id number] Patient Account Number: [id number] Date of Birth/Sex: November 22, 1955 (62 y.o. Male) Treating RN: Curtis Sites Primary Care Ahmya Bernick: Karie Fetch Other Clinician: Referring Inaya Gillham: Karie Fetch Treating Tracker Mance/Extender: [facility] [place] in Treatment: 0 Foot Assessment Items Site Locations + = Sensation present, - = Sensation absent, C = Callus, U = Ulcer R = Redness, W = Warmth, M = Maceration, PU = Pre-ulcerative lesion F = Fissure, S = Swelling, D = Dryness Assessment Right: Left: Other Deformity: No No Prior Foot Ulcer: No No Prior Amputation: No No Charcot Joint: No No Ambulatory Status: Non-ambulatory Assistance Device: Wheelchair Gait:  Steady Electronic Signature(s) Signed: 03/29/2018 5:19:25 PM By: Curtis Sites Entered By: Curtis Sites on 03/29/2018 09:08:21 Mitchell Dixon ([id number]) -------------------------------------------------------------------------------- Nutrition Risk Assessment Details Patient Name: Mitchell Dixon Date of Service: 03/29/2018 8:45 AM Medical Record Number: [id number] Patient Account Number: [id number] Date of Birth/Sex: 03/28/1955 (62 y.o. Male) Treating RN: Curtis Sites Primary Care Johnni Wunschel: Karie Fetch Other Clinician: Referring Laurelyn Terrero: Karie Fetch Treating Sheilia Reznick/Extender: [facility] [place] in Treatment: 0 Height (in): 70 Weight (lbs): 140 Body Mass Index (BMI): 20.1 Nutrition Risk Assessment Items NUTRITION RISK SCREEN: I have an illness or condition that made me change the kind and/or amount of 0 No food I eat I eat fewer than two meals per day 0 No I eat few fruits and vegetables, or milk products 0 No I have three or more drinks of beer, liquor or wine almost every day 0 No I have tooth or mouth problems that make it hard for me to eat 0 No I don't always have enough money to buy the food I need 0 No I eat alone most of the time 0 No I take three or more different prescribed or over-the-counter drugs a day 1 Yes Without wanting to, I have lost or gained 10 pounds in the last six months 0 No I am not always physically able to shop, cook and/or feed myself 0 No Nutrition Protocols Good Risk Protocol 0 No interventions needed Moderate Risk Protocol Electronic Signature(s) Signed: 03/29/2018 5:19:25 PM By: Curtis Sites Entered By: Curtis Sites on 03/29/2018 08:56:18

## 2018-03-31 NOTE — Progress Notes (Addendum)
[name], [name] ([id number]) Visit Report for 03/29/2018 Chief Complaint Document Details Patient Name: Mitchell Dixon Date of Service: 03/29/2018 8:45 AM Medical Record Number: [id number] Patient Account Number: [id number] Date of Birth/Sex: [birth year] (63 y.o. M) Treating RN: Huel Coventry Primary Care Provider: Karie Fetch Other Clinician: Referring Provider: Karie Fetch Treating Provider/Extender: Altamese [place] in Treatment: 0 Information Obtained from: Patient Chief Complaint 03/29/2018; patient is here for review of a sacral pressure ulcer and a more superficial ulcer on the right buttock Electronic Signature(s) Signed: 03/30/2018 9:49:40 AM By: Baltazar Najjar MD Entered By: Baltazar Najjar on 03/30/2018 09:47:44 Tangen, [name] ([id number]) -------------------------------------------------------------------------------- Debridement Details Patient Name: Mitchell Dixon Date of Service: 03/29/2018 8:45 AM Medical Record Number: [id number] Patient Account Number: [id number] Date of Birth/Sex: December 02, 1955 (63 y.o. M) Treating RN: Huel Coventry Primary Care Provider: Karie Fetch Other Clinician: Referring Provider: Karie Fetch Treating Provider/Extender: Altamese [place] in Treatment: 0 Debridement Performed for Wound #1 Midline Sacrum Assessment: Performed By: Physician Maxwell Caul, MD Debridement Type: Debridement Level of Consciousness (Pre- Awake and Alert procedure): Pre-procedure Verification/Time Yes - 09:40 Out Taken: Start Time: 09:40 Pain Control: Lidocaine Total Area Debrided (L x W): 3.3 (cm) x 3 (cm) = 9.9 (cm) Tissue and other material Viable, Non-Viable, Eschar, Slough, Subcutaneous, Slough debrided: Level: Skin/Subcutaneous Tissue Debridement Description: Excisional Instrument: Curette, Forceps Specimen: Swab, Number of Specimens Taken: 1 Bleeding: Moderate Hemostasis Achieved: Pressure End Time:  09:46 Procedural Pain: Insensate Post Procedural Pain: Insensate Response to Treatment: Procedure was tolerated well Level of Consciousness Awake and Alert (Post-procedure): Post Debridement Measurements of Total Wound Length: (cm) 3.3 Stage: Category/Stage IV Width: (cm) 3 Depth: (cm) 2.1 Volume: (cm) 16.328 Character of Wound/Ulcer Post Stable Debridement: Post Procedure Diagnosis Same as Pre-procedure Electronic Signature(s) Signed: 03/29/2018 6:00:27 PM By: Elliot Gurney, BSN, RN, CWS, Kim RN, BSN Signed: 03/30/2018 9:49:40 AM By: Baltazar Najjar MD Entered By: Elliot Gurney, BSN, RN, CWS, Kim on 03/29/2018 09:46:24 NAVJOT, LOERA ([id number]) -------------------------------------------------------------------------------- HPI Details Patient Name: Mitchell Dixon Date of Service: 03/29/2018 8:45 AM Medical Record Number: [id number] Patient Account Number: [id number] Date of Birth/Sex: 13-Oct-1955 (63 y.o. 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 He has been followed at the [facility] wound care clinic/podiatry for wounds on his bilateral feet. He has home health. We did not look at these today. Over the last 3 weeks they noticed a small area on his sacrum which has matured into a fairly substantial pressure ulcer. He is here for our review of this. 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 He also required an embolectomy and fasciectomy of the right leg and he  currently has wounds on the right heel and left ankle but as stated these are being followed at [facility]. 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 fib on Eliquis, Electronic Signature(s) Signed:  03/30/2018 9:49:40 AM By: Baltazar Najjarobson, Michael MD Entered By: Baltazar Najjarobson, Michael on 03/30/2018 09:47:57 Weinert, Rachel MouldsICKEY N. ([id number]) -------------------------------------------------------------------------------- Physical Exam Details Patient Name: Mitchell GearPATTERSON, Chapman N. Date of Service: 03/29/2018 8:45 AM Medical Record Number: [id number] Patient Account Number: [id number] Date of Birth/Sex: 1955-11-28 (63 y.o. M) Treating RN: Huel CoventryWoody, Kim Primary Care Provider: Karie FetchAYCOCK, NGWE Other Clinician: Referring Provider: Karie FetchAYCOCK, NGWE Treating Provider/Extender: Altamese CarolinaOBSON, MICHAEL G Weeks in Treatment: 0 Constitutional Patient is hypertensive.. Pulse regular and within target range for patient.Marland Kitchen. Respirations regular, non-labored and within target range.. Temperature is normal and within the target range for the patient.Marland Kitchen. appears in no distress. Somewhat thin man although his brother states this is always been so. Eyes Conjunctivae clear. No discharge. Respiratory Respiratory effort is easy and symmetric bilaterally. Rate is normal at rest and on room air.. Bilateral breath sounds are clear and equal in all lobes with no wheezes, rales or rhonchi.. Cardiovascular I did not hear any systolic murmurs. Gastrointestinal (GI) Abdomen is soft and non-distended without masses or tenderness. Bowel sounds active in all quadrants.. No liver or spleen enlargement or tenderness.. Neurological Lower thoracic spinal level. Psychiatric No evidence of depression, anxiety, or agitation. Calm, cooperative, and communicative. Appropriate  interactions and affect.. Notes Wound exam oThe area is on the lower sacral a little bigger than a quarter in terms of area however this has considerable depth and necrotic debris at the surface. Using a #5 curette and pickups I removed copious amounts of nonviable subcutaneous tissue. This does not have palpable bone but it is certainly close to it. There is also very significant undermining. [facility] area on the right buttock which is a superficial stage II Electronic Signature(s) Signed: 03/30/2018 9:49:40 AM By: Baltazar Najjarobson, Michael MD Entered By: Baltazar Najjarobson, Michael on 03/29/2018 10:38:06 Mitchell GearPATTERSON, Nuchem N. ([id number]) -------------------------------------------------------------------------------- Physician Orders Details Patient Name: Mitchell GearPATTERSON, Eligh N. Date of Service: 03/29/2018 8:45 AM Medical Record Number: [id number] Patient Account Number: [id number] Date of Birth/Sex: 1955-11-28 (63 y.o. M) Treating RN: Huel CoventryWoody, Kim Primary Care Provider: Karie FetchAYCOCK, NGWE Other Clinician: Referring Provider: Karie FetchAYCOCK, NGWE Treating Provider/Extender: Altamese CarolinaOBSON, MICHAEL G Weeks in Treatment: 0 Verbal / Phone Orders: No Diagnosis Coding Wound Cleansing Wound #1 Midline Sacrum o Clean wound with Normal Saline. Wound #2 Right Sacrum o Clean wound with Normal Saline. Anesthetic (add to Medication List) Wound #1 Midline Sacrum o Topical Lidocaine 4% cream applied to wound bed prior to debridement (In Clinic Only). Wound #2 Right Sacrum o Topical Lidocaine 4% cream applied to wound bed prior to debridement (In Clinic Only). Primary Wound Dressing Wound #1 Midline Sacrum o Silver Alginate Wound #2 Right Sacrum o Silver Alginate Secondary Dressing Wound #1 Midline Sacrum o Boardered Foam Dressing Wound #2 Right Sacrum o Boardered Foam Dressing Dressing Change Frequency Wound #1 Midline Sacrum o Change dressing every day. Wound #2 Right Sacrum o  Change dressing every day. Follow-up Appointments Wound #1 Midline Sacrum o Return Appointment in 1 week. Wound #2 Right Sacrum o Return Appointment in 1 week. Off-Loading Mitchell GearTERSON, Dora N. ([id number]) Wound #1 Midline Sacrum o Mattress o Turn and reposition every 2 hours Wound #2 Right Sacrum o Mattress o Turn and reposition every 2 hours Home Health Wound #1 Midline Sacrum o Continue Home Health Visits - [facility] o Home Health Nurse may visit PRN to address patientos wound care needs. o FACE TO FACE ENCOUNTER: MEDICARE and MEDICAID PATIENTS: I certify that this patient is under my care and that I had  a face-to-face encounter that meets the physician face-to-face encounter requirements with this patient on this date. The encounter with the patient was in whole or in part for the following MEDICAL CONDITION: (primary reason for Home Healthcare) MEDICAL NECESSITY: I certify, that based on my findings, NURSING services are a medically necessary home health service. HOME BOUND STATUS: I certify that my clinical findings support that this patient is homebound (i.e., Due to illness or injury, pt requires aid of supportive devices such as crutches, cane, wheelchairs, walkers, the use of special transportation or the assistance of another person to leave their place of residence. There is a normal inability to leave the home and doing so requires considerable and taxing effort. Other absences are for medical reasons / religious services and are infrequent or of short duration when for other reasons). o If current dressing causes regression in wound condition, may D/C ordered dressing product/s and apply Normal Saline Moist Dressing daily until next Wound Healing Center / Other MD appointment. Notify Wound Healing Center of regression in wound condition at [phone]. o Please direct any NON-WOUND related issues/requests for orders to patient's Primary  Care Physician Wound #2 Right Sacrum o Continue Home Health Visits - [facility] o Home Health Nurse may visit PRN to address patientos wound care needs. o FACE TO FACE ENCOUNTER: MEDICARE and MEDICAID PATIENTS: I certify that this patient is under my care and that I had a face-to-face encounter that meets the physician face-to-face encounter requirements with this patient on this date. The encounter with the patient was in whole or in part for the following MEDICAL CONDITION: (primary reason for Home Healthcare) MEDICAL NECESSITY: I certify, that based on my findings, NURSING services are a medically necessary home health service. HOME BOUND STATUS: I certify that my clinical findings support that this patient is homebound (i.e., Due to illness or injury, pt requires aid of supportive devices such as crutches, cane, wheelchairs, walkers, the use of special transportation or the assistance of another person to leave their place of residence. There is a normal inability to leave the home and doing so requires considerable and taxing effort. Other absences are for medical reasons / religious services and are infrequent or of short duration when for other reasons). o If current dressing causes regression in wound condition, may D/C ordered dressing product/s and apply Normal Saline Moist Dressing daily until next Wound Healing Center / Other MD appointment. Notify Wound Healing Center of regression in wound condition at [phone]. o Please direct any NON-WOUND related issues/requests for orders to patient's Primary Care Physician Medications-please add to medication list. Wound #1 Midline Sacrum o P.O. Antibiotics Wound #2 Right Sacrum o P.O. Antibiotics Laboratory o Bacteria identified in Wound by Culture (MICRO) - sacrum oooo LOINC Code: 6462-6 FERMIN, HOSSEINI ([id number]) oooo Convenience Name: Wound culture routine Radiology o X-ray, coccyx -  Pelvis Electronic Signature(s) Signed: 03/29/2018 6:00:27 PM By: Elliot Gurney, BSN, RN, CWS, Kim RN, BSN Signed: 03/30/2018 9:49:40 AM By: Baltazar Najjar MD Entered By: Elliot Gurney, BSN, RN, CWS, Kim on 03/29/2018 09:49:21 SIDDHAN, STUECK ([id number]) -------------------------------------------------------------------------------- Problem List Details Patient Name: Mitchell Dixon Date of Service: 03/29/2018 8:45 AM Medical Record Number: [id number] Patient Account Number: [id number] Date of Birth/Sex: 1955-09-16 (63 y.o. M) Treating RN: Huel Coventry Primary Care Provider: Karie Fetch Other Clinician: Referring Provider: Karie Fetch Treating Provider/Extender: Altamese [place] in Treatment: 0 Active Problems ICD-10 Evaluated Encounter Code Description Active Date Today Diagnosis L89.154 Pressure ulcer of sacral  region, stage 4 03/29/2018 No Yes L89.312 Pressure ulcer of right buttock, stage 2 03/29/2018 No Yes G82.21 Paraplegia, complete 03/29/2018 No Yes Inactive Problems Resolved Problems Electronic Signature(s) Signed: 03/30/2018 9:49:40 AM By: Baltazar Najjar MD Entered By: Baltazar Najjar on 03/29/2018 10:26:31 Mitchell Dixon ([id number]) -------------------------------------------------------------------------------- Progress Note Details Patient Name: Mitchell Dixon Date of Service: 03/29/2018 8:45 AM Medical Record Number: [id number] Patient Account Number: [id number] Date of Birth/Sex: 01/03/1956 (63 y.o. M) Treating RN: Huel Coventry Primary Care Provider: Karie Fetch Other Clinician: Referring Provider: Karie Fetch Treating Provider/Extender: Altamese [place] in Treatment: 0 Subjective Chief Complaint Information obtained from Patient 03/29/2018; patient is here for review of a sacral pressure ulcer and a more superficial ulcer on the right buttock History of Present Illness (HPI) ADMISSION 03/29/2018 This is a 63 year old man who is a thoracic level  paraplegic secondary to a spinal infarction during a time of critical illness in late 2018. He has been followed at the [facility] wound care clinic/podiatry for wounds on his bilateral feet. He has home health. We did not look at these today. Over the last 3 weeks they noticed a small area on his sacrum which has matured into a fairly substantial pressure ulcer. He is here for our review of this. 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 He also required an embolectomy and fasciectomy of the right leg and he currently has wounds on the right heel and left ankle but as stated these are being followed at [facility]. 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 fib on Eliquis, Wound History Patient presents with 3 open wounds that have been present for approximately 3 weeks. Patient has been treating wounds in the following manner: silvadene and alginate. Laboratory tests have not been performed in the last month. Patient reportedly has not tested positive for an antibiotic resistant organism. Patient reportedly has not tested positive for osteomyelitis. Patient reportedly has had testing performed to evaluate circulation in the legs. Patient History Information obtained from Patient. Allergies No Known Drug Allergies Family History Cancer - Siblings, Diabetes - Siblings, Heart Disease - Mother, Hypertension - Mother, No family history of Hereditary Spherocytosis, Kidney Disease, Lung Disease, Seizures, Stroke, Thyroid Problems, Tuberculosis. Social History Former smoker - 2 or 3 years, Marital Status - Single, Alcohol Use - Never,  Drug Use - No History. Medical History Eyes JMARION, CHRISTIANO ([id number]) Denies history of Cataracts, Glaucoma, Optic Neuritis Ear/Nose/Mouth/Throat Denies history of Chronic sinus problems/congestion, Middle ear problems Hematologic/Lymphatic Denies history of Anemia, Hemophilia, Human Immunodeficiency Virus, Lymphedema, Sickle Cell Disease Respiratory Denies history of Aspiration, Asthma, Chronic Obstructive Pulmonary Disease (COPD), Pneumothorax, Sleep Apnea, Tuberculosis Cardiovascular Patient has history of Arrhythmia - a fib, Deep Vein Thrombosis, Hypertension Denies history of Congestive Heart Failure, Coronary Artery Disease, Hypotension, Myocardial Infarction, Peripheral Arterial Disease, Peripheral Venous Disease, Phlebitis, Vasculitis Gastrointestinal Denies history of Cirrhosis , Colitis, Crohn s, Hepatitis A, Hepatitis B, Hepatitis C Endocrine Denies history of Type I Diabetes, Type II Diabetes Genitourinary Denies history of End Stage Renal Disease Immunological Denies history of Lupus Erythematosus, Raynaud s, Scleroderma Integumentary (Skin) Patient has history of History of pressure  wounds Denies history of History of Burn Musculoskeletal Denies history of Gout, Rheumatoid Arthritis, Osteoarthritis, Osteomyelitis Neurologic Patient has history of Paraplegia - since Dec 2018 Denies history of Dementia, Neuropathy, Quadriplegia, Seizure Disorder Oncologic Denies history of Received Chemotherapy, Received Radiation Psychiatric Denies history of Anorexia/bulimia, Confinement Anxiety Medical And Surgical History Notes Genitourinary self cath Neurologic CVA in 2017 Review of Systems (ROS) Constitutional Symptoms (General Health) Denies complaints or symptoms of Fatigue, Fever, Chills, Marked Weight Change. Eyes Denies complaints or symptoms of Dry Eyes, Vision Changes, Glasses / Contacts. Ear/Nose/Mouth/Throat Denies complaints or symptoms of Difficult  clearing ears, Sinusitis. Hematologic/Lymphatic Denies complaints or symptoms of Bleeding / Clotting Disorders, Human Immunodeficiency Virus. Respiratory Denies complaints or symptoms of Chronic or frequent coughs, Shortness of Breath. Cardiovascular Complains or has symptoms of LE edema. Denies complaints or symptoms of Chest pain. Gastrointestinal Denies complaints or symptoms of Frequent diarrhea, Nausea, Vomiting. Endocrine Denies complaints or symptoms of Hepatitis, Thyroid disease, Polydypsia (Excessive Thirst). Genitourinary Denies complaints or symptoms of Kidney failure/ Dialysis, Incontinence/dribbling. Immunological PAIGE, LACOUR ([id number]) Denies complaints or symptoms of Hives, Itching. Integumentary (Skin) Complains or has symptoms of Wounds. Denies complaints or symptoms of Bleeding or bruising tendency, Breakdown, Swelling. Musculoskeletal Denies complaints or symptoms of Muscle Pain, Muscle Weakness. Neurologic Denies complaints or symptoms of Numbness/parasthesias, Focal/Weakness. Psychiatric Denies complaints or symptoms of Anxiety, Claustrophobia. Objective Constitutional Patient is hypertensive.. Pulse regular and within target range for patient.Marland Kitchen Respirations regular, non-labored and within target range.. Temperature is normal and within the target range for the patient.Marland Kitchen appears in no distress. Somewhat thin man although his brother states this is always been so. Vitals Time Taken: 8:46 AM, Height: 70 in, Source: Stated, Weight: 140 lbs, Source: Stated, BMI: 20.1, Temperature: 97.6 F, Pulse: 68 bpm, Respiratory Rate: 16 breaths/min, Blood Pressure: 148/90 mmHg. Eyes Conjunctivae clear. No discharge. Respiratory Respiratory effort is easy and symmetric bilaterally. Rate is normal at rest and on room air.. Bilateral breath sounds are clear and equal in all lobes with no wheezes, rales or rhonchi.. Cardiovascular I did not hear any systolic  murmurs. Gastrointestinal (GI) Abdomen is soft and non-distended without masses or tenderness. Bowel sounds active in all quadrants.. No liver or spleen enlargement or tenderness.. Neurological Lower thoracic spinal level. Psychiatric No evidence of depression, anxiety, or agitation. Calm, cooperative, and communicative. Appropriate interactions and affect.. General Notes: Wound exam The area is on the lower sacral a little bigger than a quarter in terms of area however this has considerable depth and necrotic debris at the surface. Using a #5 curette and pickups I removed copious amounts of nonviable subcutaneous tissue. This does not have palpable bone but it is certainly close to it. There is also very significant undermining. A culture was done He has a superficial area on the right buttock which is a superficial stage II Integumentary (Hair, Skin) Wound #1 status is Open. Original cause of wound was Gradually Appeared. The wound is located on the Midline Sacrum. The wound measures 3.3cm length x 3cm width x 1.1cm depth; 7.775cm^2 area and 8.553cm^3 volume. There is Fat Layer (Subcutaneous Tissue) Exposed exposed. There is no tunneling or undermining noted. There is a large amount of serous Timberlake, Ranier N. ([id number]) drainage noted. Foul odor after cleansing was noted. The wound margin is flat and intact. There is no granulation within the wound bed. There is a large (67-100%) amount of necrotic tissue within the wound bed including Eschar and Adherent Slough. The periwound skin appearance did not  exhibit: Callus, Crepitus, Excoriation, Induration, Rash, Scarring, Dry/Scaly, Maceration, Atrophie Blanche, Cyanosis, Ecchymosis, Hemosiderin Staining, Mottled, Pallor, Rubor, Erythema. Periwound temperature was noted as No Abnormality. Wound #2 status is Open. Original cause of wound was Pressure Injury. The wound is located on the Right Gluteus. The wound measures 0.8cm length x 0.5cm  width x 0.1cm depth; 0.314cm^2 area and 0.031cm^3 volume. The wound is limited to skin breakdown. There is no tunneling or undermining noted. There is a medium amount of serous drainage noted. The wound margin is flat and intact. There is large (67-100%) pink granulation within the wound bed. There is a small (1-33%) amount of necrotic tissue within the wound bed including Adherent Slough. The periwound skin appearance did not exhibit: Callus, Crepitus, Excoriation, Induration, Rash, Scarring, Dry/Scaly, Maceration, Atrophie Blanche, Cyanosis, Ecchymosis, Hemosiderin Staining, Mottled, Pallor, Rubor, Erythema. Periwound temperature was noted as No Abnormality. Assessment Active Problems ICD-10 Pressure ulcer of sacral region, stage 4 Pressure ulcer of right buttock, stage 2 Paraplegia, complete Diagnoses ICD-10 L89.154: Pressure ulcer of sacral region, stage 4 L89.312: Pressure ulcer of right buttock, stage 2 G82.21: Paraplegia, complete Procedures Wound #1 Pre-procedure diagnosis of Wound #1 is a Pressure Ulcer located on the Midline Sacrum . There was a Excisional Skin/Subcutaneous Tissue Debridement with a total area of 9.9 sq cm performed by Maxwell Caul, MD. With the following instrument(s): Curette, and Forceps to remove Viable and Non-Viable tissue/material. Material removed includes Eschar, Subcutaneous Tissue, and Slough after achieving pain control using Lidocaine. 1 specimen was taken by a Swab and sent to the lab per facility protocol. A time out was conducted at 09:40, prior to the start of the procedure. A Moderate amount of bleeding was controlled with Pressure. The procedure was tolerated well with a pain level of Insensate throughout and a pain level of Insensate following the procedure. Post Debridement Measurements: 3.3cm length x 3cm width x 2.1cm depth; 16.328cm^3 volume. Post debridement Stage noted as Category/Stage IV. Character of Wound/Ulcer Post Debridement  is stable. Post procedure Diagnosis Wound #1: Same as Pre-Procedure Plan JUANJOSE, MOJICA. ([id number]) Wound Cleansing: Wound #1 Midline Sacrum: Clean wound with Normal Saline. Wound #2 Right Sacrum: Clean wound with Normal Saline. Anesthetic (add to Medication List): Wound #1 Midline Sacrum: Topical Lidocaine 4% cream applied to wound bed prior to debridement (In Clinic Only). Wound #2 Right Sacrum: Topical Lidocaine 4% cream applied to wound bed prior to debridement (In Clinic Only). Primary Wound Dressing: Wound #1 Midline Sacrum: Silver Alginate Wound #2 Right Sacrum: Silver Alginate Secondary Dressing: Wound #1 Midline Sacrum: Boardered Foam Dressing Wound #2 Right Sacrum: Boardered Foam Dressing Dressing Change Frequency: Wound #1 Midline Sacrum: Change dressing every day. Wound #2 Right Sacrum: Change dressing every day. Follow-up Appointments: Wound #1 Midline Sacrum: Return Appointment in 1 week. Wound #2 Right Sacrum: Return Appointment in 1 week. Off-Loading: Wound #1 Midline Sacrum: Mattress Turn and reposition every 2 hours Wound #2 Right Sacrum: Mattress Turn and reposition every 2 hours Home Health: Wound #1 Midline Sacrum: Continue Home Health Visits - [facility] Nurse may visit PRN to address patient s wound care needs. FACE TO FACE ENCOUNTER: MEDICARE and MEDICAID PATIENTS: I certify that this patient is under my care and that I had a face-to-face encounter that meets the physician face-to-face encounter requirements with this patient on this date. The encounter with the patient was in whole or in part for the following MEDICAL CONDITION: (primary reason for Home Healthcare) MEDICAL NECESSITY: I  certify, that based on my findings, NURSING services are a medically necessary home health service. HOME BOUND STATUS: I certify that my clinical findings support that this patient is homebound (i.e., Due to illness or injury, pt  requires aid of supportive devices such as crutches, cane, wheelchairs, walkers, the use of special transportation or the assistance of another person to leave their place of residence. There is a normal inability to leave the home and doing so requires considerable and taxing effort. Other absences are for medical reasons / religious services and are infrequent or of short duration when for other reasons). If current dressing causes regression in wound condition, may D/C ordered dressing product/s and apply Normal Saline Moist Dressing daily until next Wound Healing Center / Other MD appointment. Notify Wound Healing Center of regression in wound condition at [phone]. Please direct any NON-WOUND related issues/requests for orders to patient's Primary Care Physician Wound #2 Right Sacrum: Continue Home Health Visits - [facility] Nurse may visit PRN to address patient s wound care needs. FACE TO FACE ENCOUNTER: MEDICARE and MEDICAID PATIENTS: I certify that this patient is under my care and that I had a face-to-face encounter that meets the physician face-to-face encounter requirements with this patient on this date. The JOSEALFREDO, LANGTRY ([id number]) encounter with the patient was in whole or in part for the following MEDICAL CONDITION: (primary reason for Home Healthcare) MEDICAL NECESSITY: I certify, that based on my findings, NURSING services are a medically necessary home health service. HOME BOUND STATUS: I certify that my clinical findings support that this patient is homebound (i.e., Due to illness or injury, pt requires aid of supportive devices such as crutches, cane, wheelchairs, walkers, the use of special transportation or the assistance of another person to leave their place of residence. There is a normal inability to leave the home and doing so requires considerable and taxing effort. Other absences are for medical reasons / religious services and are  infrequent or of short duration when for other reasons). If current dressing causes regression in wound condition, may D/C ordered dressing product/s and apply Normal Saline Moist Dressing daily until next Wound Healing Center / Other MD appointment. Notify Wound Healing Center of regression in wound condition at [phone]. Please direct any NON-WOUND related issues/requests for orders to patient's Primary Care Physician Medications-please add to medication list.: Wound #1 Midline Sacrum: P.O. Antibiotics Wound #2 Right Sacrum: P.O. Antibiotics Laboratory ordered were: Wound culture routine - sacrum Radiology ordered were: X-ray, coccyx - Pelvis 1. Situation of the sacrum is very concerning for an underlying wound infection/soft tissue infection or osteomyelitis. I have ordered an x-ray and done a culture but no empiric antibiotics 2. Silver alginate as the primary dressing for now these orders have been sent to home health. 3. The patient has wounds on his left ankle and right heel which are being followed by another clinic. I had some concern that home health could not take orders on a different area of the same patient from different providers although we called them and they state it was not the case although I would still be suspicious about this 4. No empiric antibiotics I will wait for the culture and x-ray results. 5. I was very firm about offloading this both in the wheelchair [has aRoho cushion] and in bed apparently having a level 1 surface Electronic Signature(s) Signed: 04/03/2018 12:33:32 PM By: Elliot Gurney, BSN, RN, CWS, Kim RN, BSN Signed: 04/05/2018 5:51:47 PM By: Baltazar Najjar  MD Previous Signature: 03/30/2018 9:48:27 AM Version By: Baltazar Najjar MD Entered By: Elliot Gurney, BSN, RN, CWS, Kim on 04/03/2018 12:33:32 ZACK, CRAGER ([id number]) -------------------------------------------------------------------------------- ROS/PFSH Details Patient Name: Mitchell Dixon Date of Service: 03/29/2018 8:45 AM Medical Record Number: [id number] Patient Account Number: [id number] Date of Birth/Sex: [birth year] (63 y.o. M) Treating RN: Curtis Sites Primary Care Provider: Karie Fetch Other Clinician: Referring Provider: Karie Fetch Treating Provider/Extender: Altamese  in Treatment: 0 Information Obtained From Patient Wound History Do you currently have one or more open woundso Yes How many open wounds do you currently haveo 3 Approximately how long have you had your woundso 3 weeks How have you been treating your wound(s) until nowo silvadene and alginate Has your wound(s) ever healed and then re-openedo No Have you had any lab work done in the past montho No Have you tested positive for an antibiotic resistant organism (MRSA, VRE)o No Have you tested positive for osteomyelitis (bone infection)o No Have you had any tests for circulation on your legso Yes Where was the test doneo [facility] vascular Constitutional Symptoms (General Health) Complaints and Symptoms: Negative for: Fatigue; Fever; Chills; Marked Weight Change Eyes Complaints and Symptoms: Negative for: Dry Eyes; Vision Changes; Glasses / Contacts Medical History: Negative for: Cataracts; Glaucoma; Optic Neuritis Ear/Nose/Mouth/Throat Complaints and Symptoms: Negative for: Difficult clearing ears; Sinusitis Medical History: Negative for: Chronic sinus problems/congestion; Middle ear problems Hematologic/Lymphatic Complaints and Symptoms: Negative for: Bleeding / Clotting Disorders; Human Immunodeficiency Virus Medical History: Negative for: Anemia; Hemophilia; Human Immunodeficiency Virus; Lymphedema; Sickle Cell Disease Respiratory Complaints and Symptoms: Negative for: Chronic or frequent coughs; Shortness of Breath Medical HistoryORRY, SIGL ([id number]) Negative for: Aspiration; Asthma; Chronic Obstructive Pulmonary Disease (COPD); Pneumothorax; Sleep  Apnea; Tuberculosis Cardiovascular Complaints and Symptoms: Positive for: LE edema Negative for: Chest pain Medical History: Positive for: Arrhythmia - a fib; Deep Vein Thrombosis; Hypertension Negative for: Congestive Heart Failure; Coronary Artery Disease; Hypotension; Myocardial Infarction; Peripheral Arterial Disease; Peripheral Venous Disease; Phlebitis; Vasculitis Gastrointestinal Complaints and Symptoms: Negative for: Frequent diarrhea; Nausea; Vomiting Medical History: Negative for: Cirrhosis ; Colitis; Crohnos; Hepatitis A; Hepatitis B; Hepatitis C Endocrine Complaints and Symptoms: Negative for: Hepatitis; Thyroid disease; Polydypsia (Excessive Thirst) Medical History: Negative for: Type I Diabetes; Type II Diabetes Genitourinary Complaints and Symptoms: Negative for: Kidney failure/ Dialysis; Incontinence/dribbling Medical History: Negative for: End Stage Renal Disease Past Medical History Notes: self cath Immunological Complaints and Symptoms: Negative for: Hives; Itching Medical History: Negative for: Lupus Erythematosus; Raynaudos; Scleroderma Integumentary (Skin) Complaints and Symptoms: Positive for: Wounds Negative for: Bleeding or bruising tendency; Breakdown; Swelling Medical History: Positive for: History of pressure wounds Negative for: History of Burn Musculoskeletal MERLEN, GURRY ([id number]) Complaints and Symptoms: Negative for: Muscle Pain; Muscle Weakness Medical History: Negative for: Gout; Rheumatoid Arthritis; Osteoarthritis; Osteomyelitis Neurologic Complaints and Symptoms: Negative for: Numbness/parasthesias; Focal/Weakness Medical History: Positive for: Paraplegia - since Dec 2018 Negative for: Dementia; Neuropathy; Quadriplegia; Seizure Disorder Past Medical History Notes: CVA in 2017 Psychiatric Complaints and Symptoms: Negative for: Anxiety; Claustrophobia Medical History: Negative for: Anorexia/bulimia; Confinement  Anxiety Oncologic Medical History: Negative for: Received Chemotherapy; Received Radiation Immunizations Pneumococcal Vaccine: Received Pneumococcal Vaccination: No Implantable Devices Family and Social History Cancer: Yes - Siblings; Diabetes: Yes - Siblings; Heart Disease: Yes - Mother; Hereditary Spherocytosis: No; Hypertension: Yes - Mother; Kidney Disease: No; Lung Disease: No; Seizures: No; Stroke: No; Thyroid Problems: No; Tuberculosis: No; Former smoker - 2 or 3 years; Marital Status - Single; Alcohol Use: Never;  Drug Use: No History; Financial Concerns: No; Food, Clothing or Shelter Needs: No; Support System Lacking: No; Transportation Concerns: No; Advanced Directives: No; Patient does not want information on Advanced Directives Electronic Signature(s) Signed: 03/29/2018 5:19:25 PM By: Curtis Sitesorthy, Joanna Signed: 03/30/2018 9:49:40 AM By: Baltazar Najjarobson, Michael MD Entered By: Curtis Sitesorthy, Joanna on 03/29/2018 09:07:58 Mitchell GearPATTERSON, Elwin N. ([id number]) -------------------------------------------------------------------------------- SuperBill Details Patient Name: Mitchell GearPATTERSON, Bryley N. Date of Service: 03/29/2018 Medical Record Number: [id number] Patient Account Number: [id number] Date of Birth/Sex: 1955/08/18 (63 y.o. M) Treating RN: Huel CoventryWoody, Kim Primary Care Provider: Karie FetchAYCOCK, NGWE Other Clinician: Referring Provider: Karie FetchAYCOCK, NGWE Treating Provider/Extender: Altamese CarolinaOBSON, MICHAEL G Weeks in Treatment: 0 Diagnosis Coding ICD-10 Codes Code Description L89.154 Pressure ulcer of sacral region, stage 4 L89.312 Pressure ulcer of right buttock, stage 2 G82.21 Paraplegia, complete Facility Procedures CPT4 Code: [id number] Description: [phone] - WOUND CARE VISIT-LEV 2 EST PT Modifier: Quantity: 1 CPT4 Code: [id number] Description: 11042 - DEB SUBQ TISSUE 20 SQ CM/< ICD-10 Diagnosis Description L89.154 Pressure ulcer of sacral region, stage 4 Modifier: Quantity: 1 Physician Procedures CPT4 Code:  [id number] Description: WC PHYS LEVEL 3 o NEW PT ICD-10 Diagnosis Description L89.154 Pressure ulcer of sacral region, stage 4 L89.312 Pressure ulcer of right buttock, stage 2 G82.21 Paraplegia, complete Modifier: 25 Quantity: 1 CPT4 Code: [id number] Description: 11042 - WC PHYS SUBQ TISS 20 SQ CM ICD-10 Diagnosis Description L89.154 Pressure ulcer of sacral region, stage 4 Modifier: Quantity: 1 Electronic Signature(s) Signed: 03/30/2018 9:49:40 AM By: Baltazar Najjarobson, Michael MD Entered By: Baltazar Najjarobson, Michael on 03/29/2018 10:40:34

## 2018-03-31 NOTE — Progress Notes (Addendum)
ORMAN, Dixon ([id number]) Visit Report for 03/29/2018 Allergy List Details Patient Name: Mitchell Dixon, Mitchell Dixon Date of Service: 03/29/2018 8:45 AM Medical Record Number: [id number] Patient Account Number: [id number] Date of Birth/Sex: 03-18-1955 (63 y.o. Male) Treating RN: Curtis Sites Primary Care Tamalyn Wadsworth: Karie Fetch Other Clinician: Referring Shai Mckenzie: Karie Fetch Treating Sebastian Lurz/Extender: Maxwell Caul Weeks in Treatment: 0 Allergies Active Allergies No Known Drug Allergies Allergy Notes Electronic Signature(s) Signed: 03/29/2018 5:19:25 PM By: Curtis Sites Entered By: Curtis Sites on 03/29/2018 08:55:06 Mitchell Dixon ([id number]) -------------------------------------------------------------------------------- Arrival Information Details Patient Name: Mitchell Dixon Date of Service: 03/29/2018 8:45 AM Medical Record Number: [id number] Patient Account Number: [id number] Date of Birth/Sex: 08/01/1955 (63 y.o. Male) Treating RN: Huel Coventry Primary Care Sir Mallis: Karie Fetch Other Clinician: Referring Sholanda Croson: Karie Fetch Treating Adaiah Jaskot/Extender: Altamese [place] in Treatment: 0 Visit Information Patient Arrived: Wheel Chair Arrival Time: 08:45 Accompanied By: brother Transfer Assistance: Manual Patient Identification Verified: Yes Secondary Verification Process Completed: Yes Electronic Signature(s) Signed: 03/29/2018 11:46:38 AM By: Dayton Martes RCP, RRT, CHT Entered By: Dayton Martes on 03/29/2018 08:46:13 Mitchell Dixon ([id number]) -------------------------------------------------------------------------------- Clinic Level of Care Assessment Details Patient Name: Mitchell Dixon Date of Service: 03/29/2018 8:45 AM Medical Record Number: [id number] Patient Account Number: [id number] Date of Birth/Sex: [birth year] (63 y.o. Male) Treating RN: Huel Coventry Primary Care Ruddy Swire: Karie Fetch  Other Clinician: Referring Passion Lavin: Karie Fetch Treating Marella Vanderpol/Extender: Altamese [place] in Treatment: 0 Clinic Level of Care Assessment Items TOOL 1 Quantity Score []  - Use when EandM and Procedure is performed on INITIAL visit 0 ASSESSMENTS - Nursing Assessment / Reassessment X - General Physical Exam (combine w/ comprehensive assessment (listed just below) when 1 20 performed on new pt. evals) []  - 0 Comprehensive Assessment (HX, ROS, Risk Assessments, Wounds Hx, etc.) ASSESSMENTS - Wound and Skin Assessment / Reassessment []  - Dermatologic / Skin Assessment (not related to wound area) 0 ASSESSMENTS - Ostomy and/or Continence Assessment and Care []  - Incontinence Assessment and Management 0 []  - 0 Ostomy Care Assessment and Management (repouching, etc.) PROCESS - Coordination of Care X - Simple Patient / Family Education for ongoing care 1 15 []  - 0 Complex (extensive) Patient / Family Education for ongoing care []  - 0 Staff obtains Chiropractor, Records, Test Results / Process Orders []  - 0 Staff telephones HHA, Nursing Homes / Clarify orders / etc []  - 0 Routine Transfer to another Facility (non-emergent condition) []  - 0 Routine Hospital Admission (non-emergent condition) X- 1 15 New Admissions / Manufacturing engineer / Ordering NPWT, Apligraf, etc. []  - 0 Emergency Hospital Admission (emergent condition) PROCESS - Special Needs []  - Pediatric / Minor Patient Management 0 []  - 0 Isolation Patient Management []  - 0 Hearing / Language / Visual special needs []  - 0 Assessment of Community assistance (transportation, D/C planning, etc.) []  - 0 Additional assistance / Altered mentation []  - 0 Support Surface(s) Assessment (bed, cushion, seat, etc.) Mitchell Dixon. ([id number]) INTERVENTIONS - Miscellaneous []  - External ear exam 0 []  - 0 Patient Transfer (multiple staff / Nurse, adult / Similar devices) []  - 0 Simple Staple / Suture removal (25 or  less) []  - 0 Complex Staple / Suture removal (26 or more) []  - 0 Hypo/Hyperglycemic Management (do not check if billed separately) []  - 0 Ankle / Brachial Index (ABI) - do not check if billed separately Has the patient been seen at the hospital within the last three years: Yes Total Score: 50 Level  Of Care: New/Established - Level 2 Electronic Signature(s) Signed: 03/29/2018 6:00:27 PM By: Elliot Gurney, BSN, RN, CWS, Kim RN, BSN Entered By: Elliot Gurney, BSN, RN, CWS, Kim on 03/29/2018 09:49:43 Mitchell Dixon ([id number]) -------------------------------------------------------------------------------- Encounter Discharge Information Details Patient Name: Mitchell Dixon Date of Service: 03/29/2018 8:45 AM Medical Record Number: [id number] Patient Account Number: [id number] Date of Birth/Sex: Jan 21, 1956 (63 y.o. Male) Treating RN: Curtis Sites Primary Care Kairee Kozma: Karie Fetch Other Clinician: Referring Kannan Proia: Karie Fetch Treating Ruth Tully/Extender: Altamese [place] in Treatment: 0 Encounter Discharge Information Items Post Procedure Vitals Discharge Condition: Stable Temperature (F): 97.6 Ambulatory Status: Wheelchair Pulse (bpm): 68 Discharge Destination: Home Respiratory Rate (breaths/min): 16 Transportation: Private Auto Blood Pressure (mmHg): 148/90 Accompanied By: brother Schedule Follow-up Appointment: Yes Clinical Summary of Care: Electronic Signature(s) Signed: 03/29/2018 5:19:25 PM By: Curtis Sites Entered By: Curtis Sites on 03/29/2018 10:06:10 Mitchell Dixon ([id number]) -------------------------------------------------------------------------------- Lower Extremity Assessment Details Patient Name: Mitchell Dixon Date of Service: 03/29/2018 8:45 AM Medical Record Number: [id number] Patient Account Number: [id number] Date of Birth/Sex: 31-Oct-1955 (63 y.o. Male) Treating RN: Curtis Sites Primary Care Zareena Willis: Karie Fetch Other  Clinician: Referring Myra Weng: Karie Fetch Treating Caleen Taaffe/Extender: Maxwell Caul Weeks in Treatment: 0 Electronic Signature(s) Signed: 03/29/2018 5:19:25 PM By: Curtis Sites Entered By: Curtis Sites on 03/29/2018 09:08:09 Mitchell Dixon ([id number]) -------------------------------------------------------------------------------- Multi Wound Chart Details Patient Name: Mitchell Dixon Date of Service: 03/29/2018 8:45 AM Medical Record Number: [id number] Patient Account Number: [id number] Date of Birth/Sex: 04-12-1955 (63 y.o. Male) Treating RN: Huel Coventry Primary Care Darol Cush: Karie Fetch Other Clinician: Referring Magdelene Ruark: Karie Fetch Treating Seanna Sisler/Extender: Altamese [place] in Treatment: 0 Vital Signs Height(in): 70 Pulse(bpm): 68 Weight(lbs): 140 Blood Pressure(mmHg): 148/90 Body Mass Index(BMI): 20 Temperature(F): 97.6 Respiratory Rate 16 (breaths/min): Photos: [N/A:N/A] Wound Location: Sacrum - Midline Right Sacrum N/A Wounding Event: Gradually Appeared Pressure Injury N/A Primary Etiology: Pressure Ulcer Pressure Ulcer N/A Comorbid History: Arrhythmia, Deep Vein Arrhythmia, Deep Vein N/A Thrombosis, Hypertension, Thrombosis, Hypertension, History of pressure wounds, History of pressure wounds, Paraplegia Paraplegia Date Acquired: 03/08/2018 03/08/2018 N/A Weeks of Treatment: 0 0 N/A Wound Status: Open Open N/A Measurements L x W x D 3.3x3x1.1 0.8x0.5x0.1 N/A (cm) Area (cm) : 7.775 0.314 N/A Volume (cm) : 8.553 0.031 N/A Classification: Unstageable/Unclassified Category/Stage II N/A Exudate Amount: Large Medium N/A Exudate Type: Serous Serous N/A Exudate Color: amber amber N/A Foul Odor After Cleansing: Yes No N/A Odor Anticipated Due to No N/A N/A Product Use: Wound Margin: Flat and Intact Flat and Intact N/A Granulation Amount: None Present (0%) Large (67-100%) N/A Granulation Quality: N/A Pink N/A Necrotic Amount: Large  (67-100%) Small (1-33%) N/A Necrotic Tissue: Eschar, Adherent Slough Adherent Slough N/A Exposed Structures: Fat Layer (Subcutaneous Fascia: No N/A Tissue) Exposed: Yes Fat Layer (Subcutaneous Fascia: No Tissue) Exposed: No Packett, Bunnie N. ([id number]) Tendon: No Tendon: No Muscle: No Muscle: No Joint: No Joint: No Bone: No Bone: No Limited to Skin Breakdown Epithelialization: None Small (1-33%) N/A Debridement: Debridement - Excisional N/A N/A Pre-procedure 09:40 N/A N/A Verification/Time Out Taken: Pain Control: Lidocaine N/A N/A Tissue Debrided: Necrotic/Eschar, N/A N/A Subcutaneous, Slough Level: Skin/Subcutaneous Tissue N/A N/A Debridement Area (sq cm): 9.9 N/A N/A Instrument: Curette, Forceps N/A N/A Specimen: Swab N/A N/A Number of Specimens 1 N/A N/A Taken: Bleeding: Moderate N/A N/A Hemostasis Achieved: Pressure N/A N/A Procedural Pain: Insensate N/A N/A Post Procedural Pain: Insensate N/A N/A Debridement Treatment Procedure was tolerated well N/A N/A Response: Post Debridement 3.3x3x2.1 N/A N/A Measurements  L x W x D (cm) Post Debridement Volume: 16.328 N/A N/A (cm) Post Debridement Stage: Category/Stage IV N/A N/A Periwound Skin Texture: Excoriation: No Excoriation: No N/A Induration: No Induration: No Callus: No Callus: No Crepitus: No Crepitus: No Rash: No Rash: No Scarring: No Scarring: No Periwound Skin Moisture: Maceration: No Maceration: No N/A Dry/Scaly: No Dry/Scaly: No Periwound Skin Color: Atrophie Blanche: No Atrophie Blanche: No N/A Cyanosis: No Cyanosis: No Ecchymosis: No Ecchymosis: No Erythema: No Erythema: No Hemosiderin Staining: No Hemosiderin Staining: No Mottled: No Mottled: No Pallor: No Pallor: No Rubor: No Rubor: No Temperature: No Abnormality No Abnormality N/A Tenderness on Palpation: No No N/A Wound Preparation: Ulcer Cleansing: Ulcer Cleansing: N/A Rinsed/Irrigated with Saline  Rinsed/Irrigated with Saline Topical Anesthetic Applied: Topical Anesthetic Applied: Other: lidocaine 4% Other: lidocaine 4% Procedures Performed: Debridement N/A N/A Treatment Notes Wound #1 (Midline Sacrum) Noe, Dozier N. ([id number]) Notes silvercel, bordered foam dressing Wound #2 (Right Sacrum) Notes silvercel, bordered foam dressing Electronic Signature(s) Signed: 03/30/2018 9:49:40 AM By: Baltazar Najjarobson, Michael MD Entered By: Baltazar Najjarobson, Michael on 03/29/2018 10:26:47 Mitchell Dixon, Mitchell N. ([id number]) -------------------------------------------------------------------------------- Multi-Disciplinary Care Plan Details Patient Name: Mitchell Dixon, Mitchell N. Date of Service: 03/29/2018 8:45 AM Medical Record Number: [id number] Patient Account Number: [id number] Date of Birth/Sex: 1955-11-07 (63 y.o. Male) Treating RN: Huel CoventryWoody, Kim Primary Care Gabriele Zwilling: Karie FetchAYCOCK, NGWE Other Clinician: Referring Olanda Boughner: Karie FetchAYCOCK, NGWE Treating Sebert Stollings/Extender: Altamese CarolinaOBSON, MICHAEL G Weeks in Treatment: 0 Active Inactive Necrotic Tissue Nursing Diagnoses: Impaired tissue integrity related to necrotic/devitalized tissue Goals: Necrotic/devitalized tissue will be minimized in the wound bed Date Initiated: 03/29/2018 Target Resolution Date: 03/29/2018 Goal Status: Active Interventions: Assess patient pain level pre-, during and post procedure and prior to discharge Treatment Activities: Apply topical anesthetic as ordered : 03/29/2018 Excisional debridement : 03/29/2018 Notes: Orientation to the Wound Care Program Nursing Diagnoses: Knowledge deficit related to the wound healing center program Goals: Patient/caregiver will verbalize understanding of the Wound Healing Center Program Date Initiated: 03/29/2018 Target Resolution Date: 03/29/2018 Goal Status: Active Interventions: Provide education on orientation to the wound center Notes: Pressure Nursing Diagnoses: Knowledge deficit related to management of  pressures ulcers Potential for impaired tissue integrity related to pressure, friction, moisture, and shear Goals: Patient/caregiver will verbalize understanding of pressure ulcer management Mitchell Dixon, Mitchell N. ([id number]) Date Initiated: 03/29/2018 Target Resolution Date: 03/29/2018 Goal Status: Active Interventions: Assess: immobility, friction, shearing, incontinence upon admission and as needed Provide education on pressure ulcers Treatment Activities: Patient referred for home evaluation of offloading devices/mattresses : 03/29/2018 Notes: Soft Tissue Infection Nursing Diagnoses: Impaired tissue integrity Potential for infection: soft tissue Goals: Patient will remain free of wound infection Date Initiated: 03/29/2018 Target Resolution Date: 03/29/2018 Goal Status: Active Signs and symptoms of infection will be recognized early to allow for prompt treatment Date Initiated: 03/29/2018 Target Resolution Date: 03/29/2018 Goal Status: Active Interventions: Assess signs and symptoms of infection every visit Treatment Activities: Culture and sensitivity : 03/29/2018 Notes: Wound/Skin Impairment Nursing Diagnoses: Impaired tissue integrity Goals: Patient/caregiver will verbalize understanding of skin care regimen Date Initiated: 03/29/2018 Target Resolution Date: 03/29/2018 Goal Status: Active Ulcer/skin breakdown will have a volume reduction of 30% by week 4 Date Initiated: 03/29/2018 Target Resolution Date: 03/29/2018 Goal Status: Active Interventions: Assess ulceration(s) every visit Treatment Activities: Patient referred to home care : 03/29/2018 Skin care regimen initiated : 03/29/2018 Mitchell Dixon, Mitchell N. ([id number]) Notes: Electronic Signature(s) Signed: 03/29/2018 6:00:27 PM By: Elliot GurneyWoody, BSN, RN, CWS, Kim RN, BSN Entered By: Elliot GurneyWoody, BSN, RN, CWS, Kim on 03/29/2018 09:43:38 Mitchell Dixon, Mitchell N.  (  [id number]) -------------------------------------------------------------------------------- Pain Assessment Details Patient Name: Mitchell Dixon, Mitchell Dixon Date of Service: 03/29/2018 8:45 AM Medical Record Number: [id number] Patient Account Number: [id number] Date of Birth/Sex: [birth year] (63 y.o. Male) Treating RN: Huel Coventry Primary Care Merelyn Klump: Karie Fetch Other Clinician: Referring Kataleya Zaugg: Karie Fetch Treating Faheem Ziemann/Extender: Altamese [place] in Treatment: 0 Active Problems Location of Pain Severity and Description of Pain Patient Has Paino No Site Locations Pain Management and Medication Current Pain Management: Electronic Signature(s) Signed: 03/29/2018 11:46:38 AM By: Dayton Martes RCP, RRT, CHT Signed: 03/29/2018 6:00:27 PM By: Elliot Gurney, BSN, RN, CWS, Kim RN, BSN Entered By: Dayton Martes on 03/29/2018 08:46:29 Mitchell Dixon ([id number]) -------------------------------------------------------------------------------- Patient/Caregiver Education Details Patient Name: Mitchell Dixon Date of Service: 03/29/2018 8:45 AM Medical Record Number: [id number] Patient Account Number: [id number] Date of Birth/Gender: 08/15/1955 (63 y.o. Male) Treating RN: Huel Coventry Primary Care Physician: Karie Fetch Other Clinician: Referring Physician: Karie Fetch Treating Physician/Extender: Altamese [place] in Treatment: 0 Education Assessment Education Provided To: Patient Education Topics Provided Pressure: Handouts: Pressure Ulcers: Care and Offloading Methods: Demonstration, Explain/Verbal Responses: State content correctly Welcome To The Wound Care Center: Handouts: Welcome To The [facility] Methods: Demonstration, Explain/Verbal Responses: State content correctly Wound/Skin Impairment: Handouts: Caring for Your Ulcer, Other: Orders sent to [facility] Methods: Demonstration, Explain/Verbal Responses: State  content correctly Electronic Signature(s) Signed: 03/29/2018 6:00:27 PM By: Elliot Gurney, BSN, RN, CWS, Kim RN, BSN Entered By: Elliot Gurney, BSN, RN, CWS, Kim on 03/29/2018 09:50:27 Mitchell Dixon ([id number]) -------------------------------------------------------------------------------- Wound Assessment Details Patient Name: Mitchell Dixon Date of Service: 03/29/2018 8:45 AM Medical Record Number: [id number] Patient Account Number: [id number] Date of Birth/Sex: 05/25/1955 (63 y.o. Male) Treating RN: Curtis Sites Primary Care Chontel Warning: Karie Fetch Other Clinician: Referring Parnell Spieler: Karie Fetch Treating Charda Janis/Extender: Maxwell Caul Weeks in Treatment: 0 Wound Status Wound Number: 1 Primary Pressure Ulcer Etiology: Wound Location: Sacrum - Midline Wound Open Wounding Event: Gradually Appeared Status: Date Acquired: 03/08/2018 Comorbid Arrhythmia, Deep Vein Thrombosis, Weeks Of Treatment: 0 History: Hypertension, History of pressure wounds, Clustered Wound: No Paraplegia Photos Wound Measurements Length: (cm) 3.3 % Reduction i Width: (cm) 3 % Reduction i Depth: (cm) 1.1 Epithelializa Area: (cm) 7.775 Tunneling: Volume: (cm) 8.553 Undermining: n Area: 0% n Volume: 0% tion: None No No Wound Description Classification: Category/Stage IV Foul Odor Aft Wound Margin: Flat and Intact Due to Produc Exudate Amount: Large Slough/Fibrin Exudate Type: Serous Exudate Color: amber er Cleansing: Yes t Use: No o Yes Wound Bed Granulation Amount: None Present (0%) Exposed Structure Necrotic Amount: Large (67-100%) Fascia Exposed: No Necrotic Quality: Eschar, Adherent Slough Fat Layer (Subcutaneous Tissue) Exposed: Yes Tendon Exposed: No Muscle Exposed: No Joint Exposed: No Bone Exposed: No Periwound Skin Texture Texture Color Blincoe, Juwon N. ([id number]) No Abnormalities Noted: No No Abnormalities Noted: No Callus: No Atrophie Blanche: No Crepitus:  No Cyanosis: No Excoriation: No Ecchymosis: No Induration: No Erythema: No Rash: No Hemosiderin Staining: No Scarring: No Mottled: No Pallor: No Moisture Rubor: No No Abnormalities Noted: No Dry / Scaly: No Temperature / Pain Maceration: No Temperature: No Abnormality Wound Preparation Ulcer Cleansing: Rinsed/Irrigated with Saline Topical Anesthetic Applied: Other: lidocaine 4%, Treatment Notes Wound #1 (Midline Sacrum) Notes silvercel, bordered foam dressing Electronic Signature(s) Signed: 03/29/2018 10:37:54 AM By: Elliot Gurney, BSN, RN, CWS, Kim RN, BSN Signed: 03/29/2018 5:19:25 PM By: Curtis Sites Entered By: Elliot Gurney, BSN, RN, CWS, Kim on 03/29/2018 10:37:53 Cadarrius, [place] Mitchell Moulds ([id number]) -------------------------------------------------------------------------------- Wound Assessment Details Patient Name: EMBERY,  Mitchell MouldsICKEY N. Date of Service: 03/29/2018 8:45 AM Medical Record Number: [id number] Patient Account Number: [id number] Date of Birth/Sex: 02-Nov-1955 (63 y.o. Male) Treating RN: Curtis Sitesorthy, Joanna Primary Care Jaramie Bastos: Karie FetchAYCOCK, NGWE Other Clinician: Referring Keiona Jenison: Karie FetchAYCOCK, NGWE Treating Kymorah Korf/Extender: Maxwell CaulOBSON, MICHAEL G Weeks in Treatment: 0 Wound Status Wound Number: 2 Primary Pressure Ulcer Etiology: Wound Location: Right Gluteus Wound Open Wounding Event: Pressure Injury Status: Date Acquired: 03/08/2018 Comorbid Arrhythmia, Deep Vein Thrombosis, Weeks Of Treatment: 0 History: Hypertension, History of pressure wounds, Clustered Wound: No Paraplegia Photos Wound Measurements Length: (cm) 0.8 % Reduction in Width: (cm) 0.5 % Reduction in Depth: (cm) 0.1 Epithelializat Area: (cm) 0.314 Tunneling: Volume: (cm) 0.031 Undermining: Area: 0% Volume: 0% ion: Small (1-33%) No No Wound Description Classification: Category/Stage II Foul Odor Afte Wound Margin: Flat and Intact Slough/Fibrino Exudate Amount: Medium Exudate Type: Serous Exudate  Color: amber r Cleansing: No Yes Wound Bed Granulation Amount: Large (67-100%) Exposed Structure Granulation Quality: Pink Fascia Exposed: No Necrotic Amount: Small (1-33%) Fat Layer (Subcutaneous Tissue) Exposed: No Necrotic Quality: Adherent Slough Tendon Exposed: No Muscle Exposed: No Joint Exposed: No Bone Exposed: No Limited to Skin Breakdown Periwound Skin Texture Mitchell Dixon, Yousof N. ([id number]) Texture Color No Abnormalities Noted: No No Abnormalities Noted: No Callus: No Atrophie Blanche: No Crepitus: No Cyanosis: No Excoriation: No Ecchymosis: No Induration: No Erythema: No Rash: No Hemosiderin Staining: No Scarring: No Mottled: No Pallor: No Moisture Rubor: No No Abnormalities Noted: No Dry / Scaly: No Temperature / Pain Maceration: No Temperature: No Abnormality Wound Preparation Ulcer Cleansing: Rinsed/Irrigated with Saline Topical Anesthetic Applied: Other: lidocaine 4%, Treatment Notes Wound #2 (Right Sacrum) Notes silvercel, bordered foam dressing Electronic Signature(s) Signed: 04/03/2018 12:33:09 PM By: Elliot GurneyWoody, BSN, RN, CWS, Kim RN, BSN Signed: 04/03/2018 4:27:23 PM By: Curtis Sitesorthy, Joanna Previous Signature: 03/29/2018 5:19:25 PM Version By: Curtis Sitesorthy, Joanna Entered By: Elliot GurneyWoody, BSN, RN, CWS, Kim on 04/03/2018 12:33:09 Mitchell Dixon, Mitchell N. ([id number]) -------------------------------------------------------------------------------- Vitals Details Patient Name: Mitchell Dixon, Elton N. Date of Service: 03/29/2018 8:45 AM Medical Record Number: [id number] Patient Account Number: [id number] Date of Birth/Sex: 02-Nov-1955 (63 y.o. Male) Treating RN: Huel CoventryWoody, Kim Primary Care Brittnee Gaetano: Karie FetchAYCOCK, NGWE Other Clinician: Referring Nitya Cauthon: Karie FetchAYCOCK, NGWE Treating Chassidy Layson/Extender: Altamese CarolinaOBSON, MICHAEL G Weeks in Treatment: 0 Vital Signs Time Taken: 08:46 Temperature (F): 97.6 Height (in): 70 Pulse (bpm): 68 Source: Stated Respiratory Rate (breaths/min): 16 Weight  (lbs): 140 Blood Pressure (mmHg): 148/90 Source: Stated Reference Range: 80 - 120 mg / dl Body Mass Index (BMI): 20.1 Airway Electronic Signature(s) Signed: 03/29/2018 11:46:38 AM By: Dayton MartesWallace, RCP,RRT,CHT, Sallie RCP, RRT, CHT Entered By: Dayton MartesWallace, RCP,RRT,CHT, Sallie on 03/29/2018 08:51:33

## 2018-04-02 LAB — AEROBIC CULTURE W GRAM STAIN (SUPERFICIAL SPECIMEN)

## 2018-04-02 LAB — AEROBIC CULTURE  (SUPERFICIAL SPECIMEN): GRAM STAIN: NONE SEEN

## 2018-04-05 ENCOUNTER — Encounter: Payer: Medicaid Other | Attending: Internal Medicine | Admitting: Internal Medicine

## 2018-04-05 DIAGNOSIS — I10 Essential (primary) hypertension: Secondary | ICD-10-CM | POA: Diagnosis not present

## 2018-04-05 DIAGNOSIS — G8221 Paraplegia, complete: Secondary | ICD-10-CM | POA: Insufficient documentation

## 2018-04-05 DIAGNOSIS — I4891 Unspecified atrial fibrillation: Secondary | ICD-10-CM | POA: Diagnosis not present

## 2018-04-05 DIAGNOSIS — Z950 Presence of cardiac pacemaker: Secondary | ICD-10-CM | POA: Diagnosis not present

## 2018-04-05 DIAGNOSIS — Z86718 Personal history of other venous thrombosis and embolism: Secondary | ICD-10-CM | POA: Diagnosis not present

## 2018-04-05 DIAGNOSIS — L89154 Pressure ulcer of sacral region, stage 4: Secondary | ICD-10-CM | POA: Diagnosis present

## 2018-04-05 DIAGNOSIS — Q211 Atrial septal defect: Secondary | ICD-10-CM | POA: Insufficient documentation

## 2018-04-05 DIAGNOSIS — Z7901 Long term (current) use of anticoagulants: Secondary | ICD-10-CM | POA: Diagnosis not present

## 2018-04-06 ENCOUNTER — Other Ambulatory Visit
Admission: RE | Admit: 2018-04-06 | Discharge: 2018-04-06 | Disposition: A | Discharge status: Home / Self Care | Payer: Medicaid Other | Source: Ambulatory Visit | Attending: Internal Medicine | Admitting: Internal Medicine

## 2018-04-06 NOTE — Progress Notes (Addendum)
Mitchell Dixon, Mitchell Dixon ([id number]) Visit Report for 04/05/2018 Debridement Details Patient Name: Mitchell Dixon, Mitchell Dixon Date of Service: 04/05/2018 10:30 AM Medical Record Number: [id number] Patient Account Number: [id number] Date of Birth/Sex: 08-23-1955 (63 y.o. M) Treating RN: Huel Coventry Primary Care Provider: Karie Fetch Other Clinician: Referring Provider: Karie Fetch Treating Provider/Extender: Altamese [place] in Treatment: 1 Debridement Performed for Wound #1 Midline Sacrum Assessment: Performed By: Physician Maxwell Caul, MD Debridement Type: Debridement Level of Consciousness (Pre- Awake and Alert procedure): Pre-procedure Verification/Time Yes - 11:30 Out Taken: Start Time: 11:31 Pain Control: Lidocaine Total Area Debrided (L x W): 2.6 (cm) x 2.7 (cm) = 7.02 (cm) Tissue and other material Viable, Non-Viable, Muscle, Slough, Subcutaneous, Slough debrided: Level: Skin/Subcutaneous Tissue/Muscle Debridement Description: Excisional Instrument: Curette Bleeding: Moderate Hemostasis Achieved: Pressure End Time: 11:36 Response to Treatment: Procedure was tolerated well Level of Consciousness Awake and Alert (Post-procedure): Post Debridement Measurements of Total Wound Length: (cm) 2.6 Stage: Category/Stage IV Width: (cm) 2.7 Depth: (cm) 1.8 Volume: (cm) 9.924 Character of Wound/Ulcer Post Stable Debridement: Post Procedure Diagnosis Same as Pre-procedure Electronic Signature(s) Signed: 04/05/2018 5:28:11 PM By: Elliot Gurney, BSN, RN, CWS, Kim RN, BSN Signed: 04/05/2018 5:51:47 PM By: Baltazar Najjar MD Entered By: Baltazar Najjar on 04/05/2018 12:32:48 Mitchell Dixon ([id number]) -------------------------------------------------------------------------------- HPI Details Patient Name: Mitchell Dixon Date of Service: 04/05/2018 10:30 AM Medical Record Number: [id number] Patient Account Number: [id number] Date of Birth/Sex: 11/14/1955 (63 y.o.  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 He has been followed at the [facility] wound care clinic/podiatry for wounds on his bilateral feet. He has home health. We did not look at these today. Over the last 3 weeks they noticed a small area on his sacrum which has matured into a fairly substantial pressure ulcer. He is here for our review of this. 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 He also required an embolectomy and fasciectomy of the right leg and he currently has wounds on the right heel and left ankle but as stated these are being followed at [facility]. 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 X-ray we did last week did not show evidence of acute osteomyelitis in the sacrum. The patient has a pacemaker and will need a CT scan of this area to exclude osteomyelitis. Swab culture I did showed a few Enterococcus faecalis and a few enterococcus avium I started him on Augmentin 875 twice daily for 10 days on 2/3 Electronic Signature(s) Signed: 04/05/2018 5:51:47 PM By: Baltazar Najjar MD Entered By: Baltazar Najjar on 04/05/2018 12:33:47 Mitchell Dixon  ([id number]) -------------------------------------------------------------------------------- Physical Exam Details Patient Name: Mitchell Dixon Date of Service: 04/05/2018 10:30 AM Medical Record Number: [id number] Patient Account Number: [id number] Date of Birth/Sex: [birth year] (62 y.o.  M) Treating RN: Huel Coventry Primary Care Provider: Karie Fetch Other Clinician: Referring Provider: Karie Fetch Treating Provider/Extender: Altamese [place] in Treatment: 1 Constitutional Sitting or standing Blood Pressure is within target range for patient.. Pulse regular and within target range for patient.Marland Kitchen Respirations regular, non-labored and within target range.. Temperature is normal and within the target range for the patient.Marland Kitchen appears in no distress. Notes Wound exam; the area on the lower sacrum about the same dimensions. This is deep and is very close to bone. Using a #5 curette I am still removing large amounts of necrotic material which I suspect is muscle. There is also very significant undermining superiorly from roughly 11-2 o'clock. There is no surrounding soft tissue infection no crepitus Electronic Signature(s) Signed: 04/05/2018 5:51:47 PM By: Baltazar Najjar MD Entered By: Baltazar Najjar on 04/05/2018 12:34:45 Mitchell Dixon ([id number]) -------------------------------------------------------------------------------- Physician Orders Details Patient Name: Mitchell Dixon Date of Service: 04/05/2018 10:30 AM Medical Record Number: [id number] Patient Account Number: [id number] Date of Birth/Sex: [birth year] (63 y.o. M) Treating RN: Huel Coventry Primary Care Provider: Karie Fetch Other Clinician: Referring Provider: Karie Fetch Treating Provider/Extender: Altamese [place] in Treatment: 1 Verbal / Phone Orders: No Diagnosis Coding Wound Cleansing Wound #1 Midline Sacrum o Clean wound with Normal Saline. Anesthetic (add to Medication List) Wound #1  Midline Sacrum o Topical Lidocaine 4% cream applied to wound bed prior to debridement (In Clinic Only). Primary Wound Dressing Wound #1 Midline Sacrum o Silver Alginate Secondary Dressing Wound #1 Midline Sacrum o Boardered Foam Dressing Dressing Change Frequency Wound #1 Midline Sacrum o Change Dressing Monday, Wednesday, Friday Follow-up Appointments Wound #1 Midline Sacrum o Return Appointment in 1 week. Off-Loading Wound #1 Midline Sacrum o Mattress o Turn and reposition every 2 hours Home Health Wound #1 Midline Sacrum o Continue Home Health Visits - [facility] o Home Health Nurse may visit PRN to address patientos wound care needs. o FACE TO FACE ENCOUNTER: MEDICARE and MEDICAID PATIENTS: I certify that this patient is under my care and that I had a face-to-face encounter that meets the physician face-to-face encounter requirements with this patient on this date. The encounter with the patient was in whole or in part for the following MEDICAL CONDITION: (primary reason for Home Healthcare) MEDICAL NECESSITY: I certify, that based on my findings, NURSING services are a medically necessary home health service. HOME BOUND STATUS: I certify that my clinical findings support that this patient is homebound (i.e., Due to illness or injury, pt requires aid of supportive devices such as crutches, cane, wheelchairs, walkers, the use of special transportation or the assistance of another person to leave their place of residence. There is a normal inability to leave the home Mitchell Dixon, FAZZIO. ([id number]) and doing so requires considerable and taxing effort. Other absences are for medical reasons / religious services and are infrequent or of short duration when for other reasons). o If current dressing causes regression in wound condition, may D/C ordered dressing product/s and apply Normal Saline Moist Dressing daily until next Wound Healing Center /  Other MD appointment. Notify Wound Healing Center of regression in wound condition at [phone]. o Please direct any NON-WOUND related issues/requests for orders to patient's Primary Care Physician Medications-please add to medication list. Wound #1 Midline Sacrum o P.O. Antibiotics - continue Laboratory o Bacteria identified in Wound by Culture (MICRO) - Sacrum right oooo LOINC Code: 6462-6 oooo Convenience Name: Wound culture routine Radiology o Computed Tomography (CT) Scan -  Pelvis:non-healing wound sacrum Electronic Signature(s) Signed: 04/06/2018 4:54:30 PM By: Elliot Gurney, BSN, RN, CWS, Kim RN, BSN Signed: 04/12/2018 6:19:03 PM By: Baltazar Najjar MD Previous Signature: 04/05/2018 5:28:11 PM Version By: Elliot Gurney BSN, RN, CWS, Kim RN, BSN Previous Signature: 04/05/2018 5:51:47 PM Version By: Baltazar Najjar MD Entered By: Elliot Gurney, BSN, RN, CWS, Kim on 04/06/2018 10:25:48 Mitchell Dixon, Mitchell Dixon ([id number]) -------------------------------------------------------------------------------- Problem List Details Patient Name: Mitchell Dixon Date of Service: 04/05/2018 10:30 AM Medical Record Number: [id number] Patient Account Number: [id number] Date of Birth/Sex: [birth year] (63 y.o. M) Treating RN: Huel Coventry Primary Care Provider: Karie Fetch Other Clinician: Referring Provider: Karie Fetch Treating Provider/Extender: Altamese [place] in Treatment: 1 Active Problems ICD-10 Evaluated Encounter Code Description Active Date Today Diagnosis L89.154 Pressure ulcer of sacral region, stage 4 03/29/2018 No Yes L89.312 Pressure ulcer of right buttock, stage 2 03/29/2018 No Yes G82.21 Paraplegia, complete 03/29/2018 No Yes Inactive Problems Resolved Problems Electronic Signature(s) Signed: 04/05/2018 5:51:47 PM By: Baltazar Najjar MD Entered By: Baltazar Najjar on 04/05/2018 12:32:22 Mitchell Dixon  ([id number]) -------------------------------------------------------------------------------- Progress Note Details Patient Name: Mitchell Dixon Date of Service: 04/05/2018 10:30 AM Medical Record Number: [id number] Patient Account Number: [id number] Date of Birth/Sex: 07/23/1955 (62 y.o. 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 He has been followed at the [facility] wound care clinic/podiatry for wounds on his bilateral feet. He has home health. We did not look at these today. Over the last 3 weeks they noticed a small area on his sacrum which has matured into a fairly substantial pressure ulcer. He is here for our review of this. 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 He also required an embolectomy and fasciectomy of the right leg and he currently has wounds on the right heel and left ankle but as stated these are being followed at [facility]. 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 X-ray we did last week did not show evidence of acute osteomyelitis in the sacrum. The patient has a pacemaker and will need a CT scan of this area to exclude osteomyelitis. Swab culture I did showed a few Enterococcus  faecalis and a few enterococcus avium I started him on Augmentin 875 twice daily for 10 days on 2/3 Objective Constitutional Sitting or standing Blood Pressure is within target range for patient.. Pulse regular and within target range for patient.Marland Kitchen Respirations regular, non-labored and within target range.. Temperature is normal and within the target range for the patient.Marland Kitchen appears in no distress. Vitals Time Taken: 10:51 AM, Height: 70 in, Weight: 140 lbs, BMI: 20.1, Temperature: 97.6 F, Pulse: 82 bpm, Respiratory Rate: 18 breaths/min, Blood Pressure: 115/76 mmHg. General Notes: Wound exam; the area on the lower sacrum about the same dimensions. This is deep and  is very close to bone. Using a #5 curette I am still removing large amounts of necrotic material which I suspect is muscle. There is also very significant undermining superiorly from roughly 11-2 o'clock. There is no surrounding soft tissue infection no crepitus Integumentary (Hair, Skin) Mitchell Dixon, Mitchell N. ([id number]) Wound #1 status is Open. Original cause of wound was Gradually Appeared. The wound is located on the Midline Sacrum. The wound measures 2.6cm length x 2.7cm width x 1.5cm depth; 5.513cm^2 area and 8.27cm^3 volume. There is muscle and Fat Layer (Subcutaneous Tissue) Exposed exposed. There is no tunneling noted, however, there is undermining starting at 12:00 and ending at 12:00 with a maximum distance of 1.8cm. There is a large amount of serous drainage noted. Foul odor after cleansing was noted. The wound margin is flat and intact. There is small (1-33%) pink granulation within the wound bed. There is a large (67-100%) amount of necrotic tissue within the wound bed including Adherent Slough. The periwound skin appearance did not exhibit: Callus, Crepitus, Excoriation, Induration, Rash, Scarring, Dry/Scaly, Maceration, Atrophie Blanche, Cyanosis, Ecchymosis, Hemosiderin Staining, Mottled, Pallor, Rubor, Erythema.  Periwound temperature was noted as No Abnormality. Wound #2 status is Healed - Epithelialized. Original cause of wound was Pressure Injury. The wound is located on the Right Gluteus. The wound measures 0cm length x 0cm width x 0cm depth; 0cm^2 area and 0cm^3 volume. Assessment Active Problems ICD-10 Pressure ulcer of sacral region, stage 4 Pressure ulcer of right buttock, stage 2 Paraplegia, complete Procedures Wound #1 Pre-procedure diagnosis of Wound #1 is a Pressure Ulcer located on the Midline Sacrum . There was a Excisional Skin/Subcutaneous Tissue/Muscle Debridement with a total area of 7.02 sq cm performed by Maxwell Caul, MD. With the following instrument(s): Curette to remove Viable and Non-Viable tissue/material. Material removed includes Muscle, Subcutaneous Tissue, and Slough after achieving pain control using Lidocaine. No specimens were taken. A time out was conducted at 11:30, prior to the start of the procedure. A Moderate amount of bleeding was controlled with Pressure. The procedure was tolerated well. Post Debridement Measurements: 2.6cm length x 2.7cm width x 1.8cm depth; 9.924cm^3 volume. Post debridement Stage noted as Category/Stage IV. Character of Wound/Ulcer Post Debridement is stable. Post procedure Diagnosis Wound #1: Same as Pre-Procedure Plan Wound Cleansing: Wound #1 Midline Sacrum: Clean wound with Normal Saline. Anesthetic (add to Medication List): Wound #1 Midline Sacrum: Topical Lidocaine 4% cream applied to wound bed prior to debridement (In Clinic Only). Primary Wound Dressing: Wound #1 Midline Sacrum: Mitchell Dixon, Mitchell Dixon ([id number]) Silver Alginate Secondary Dressing: Wound #1 Midline Sacrum: Boardered Foam Dressing Dressing Change Frequency: Wound #1 Midline Sacrum: Change Dressing Monday, Wednesday, Friday Follow-up Appointments: Wound #1 Midline Sacrum: Return Appointment in 1 week. Off-Loading: Wound #1 Midline  Sacrum: Mattress Turn and reposition every 2 hours Home Health: Wound #1 Midline Sacrum: Continue Home Health Visits - [facility] Nurse may visit PRN to address patient s wound care needs. FACE TO FACE ENCOUNTER: MEDICARE and MEDICAID PATIENTS: I certify that this patient is under my care and that I had a face-to-face encounter that meets the physician face-to-face encounter requirements with this patient on this date. The encounter with the patient was in whole or in part for the following MEDICAL CONDITION: (primary reason for Home Healthcare) MEDICAL NECESSITY: I certify, that based on my findings, NURSING services are a medically necessary home health service. HOME BOUND STATUS: I certify that my clinical findings support that this patient is homebound (i.e.,  Due to illness or injury, pt requires aid of supportive devices such as crutches, cane, wheelchairs, walkers, the use of special transportation or the assistance of another person to leave their place of residence. There is a normal inability to leave the home and doing so requires considerable and taxing effort. Other absences are for medical reasons / religious services and are infrequent or of short duration when for other reasons). If current dressing causes regression in wound condition, may D/C ordered dressing product/s and apply Normal Saline Moist Dressing daily until next Wound Healing Center / Other MD appointment. Notify Wound Healing Center of regression in wound condition at [phone]. Please direct any NON-WOUND related issues/requests for orders to patient's Primary Care Physician Medications-please add to medication list.: Wound #1 Midline Sacrum: P.O. Antibiotics - continue Radiology ordered were: Computed Tomography (CT) Scan - Pelvis:non-healing wound sacrum 1. Silver alginate to continue 2. He will need a CT scan with contrast to look at the lower sacral bone and determine the  possibility of underlying osteomyelitis. If this is positive I will go ahead and do a bone specimen for pathology and culture 3. He is aggressively trying to stay off this and his brother confirms this Electronic Signature(s) Signed: 04/05/2018 5:51:47 PM By: Baltazar Najjarobson, Jerame Hedding MD Entered By: Baltazar Najjarobson, Shayleigh Bouldin on 04/05/2018 12:35:45 Mitchell Dixon, Mitchell N. ([id number]) -------------------------------------------------------------------------------- SuperBill Details Patient Name: Mitchell Dixon, Mitchell N. Date of Service: 04/05/2018 Medical Record Number: [id number] Patient Account Number: [id number] Date of Birth/Sex: 1955/07/07 (63 y.o. M) Treating RN: Huel CoventryWoody, Kim Primary Care Provider: Karie FetchAYCOCK, NGWE Other Clinician: Referring Provider: Karie FetchAYCOCK, NGWE Treating Provider/Extender: Altamese CarolinaOBSON, Torrez Renfroe G Weeks in Treatment: 1 Diagnosis Coding ICD-10 Codes Code Description L89.154 Pressure ulcer of sacral region, stage 4 L89.312 Pressure ulcer of right buttock, stage 2 G82.21 Paraplegia, complete Facility Procedures CPT4 Code: [id number] Description: 11043 - DEB MUSC/FASCIA 20 SQ CM/< ICD-10 Diagnosis Description L89.154 Pressure ulcer of sacral region, stage 4 Modifier: Quantity: 1 Physician Procedures CPT4 Code: [id number] Description: 11043 - WC PHYS DEBR MUSCLE/FASCIA 20 SQ CM ICD-10 Diagnosis Description L89.154 Pressure ulcer of sacral region, stage 4 Modifier: Quantity: 1 Electronic Signature(s) Signed: 04/05/2018 5:51:47 PM By: Baltazar Najjarobson, Carvel Huskins MD Entered By: Baltazar Najjarobson, Maury Bamba on 04/05/2018 12:36:01

## 2018-04-07 NOTE — Progress Notes (Signed)
[name], [name] ([id number]) Visit Report for 04/05/2018 Arrival Information Details Patient Name: Mitchell Dixon, Mitchell Dixon Date of Service: 04/05/2018 10:30 AM Medical Record Number: [id number] Patient Account Number: [id number] Date of Birth/Sex: November 24, 1955 (63 y.o. M) Treating RN: Huel Coventry Primary Care Nabiha Planck: Karie Fetch Other Clinician: Referring Ruth Tully: Karie Fetch Treating Meaghann Choo/Extender: Altamese [place] in Treatment: 1 Visit Information History Since Last Visit Added or deleted any medications: No Patient Arrived: Wheel Chair Any new allergies or adverse reactions: No Arrival Time: 10:50 Had a fall or experienced change in No Accompanied By: brother activities of daily living that may affect Transfer Assistance: Manual risk of falls: Patient Identification Verified: Yes Signs or symptoms of abuse/neglect since last visito No Secondary Verification Process Completed: Yes Hospitalized since last visit: No Implantable device outside of the clinic excluding No cellular tissue based products placed in the center since last visit: Has Dressing in Place as Prescribed: Yes Pain Present Now: No Electronic Signature(s) Signed: 04/05/2018 11:35:01 AM By: Dayton Martes RCP, RRT, CHT Entered By: Dayton Martes on 04/05/2018 10:51:18 Mitchell Dixon ([id number]) -------------------------------------------------------------------------------- Encounter Discharge Information Details Patient Name: Mitchell Dixon Date of Service: 04/05/2018 10:30 AM Medical Record Number: [id number] Patient Account Number: [id number] Date of Birth/Sex: 04-13-1955 (63 y.o. M) Treating RN: Arnette Norris Primary Care Ayan Yankey: Karie Fetch Other Clinician: Referring Chesky Heyer: Karie Fetch Treating Roshawna Colclasure/Extender: Altamese [place] in Treatment: 1 Encounter Discharge Information Items Post Procedure Vitals Discharge Condition:  Stable Temperature (F): 97.6 Ambulatory Status: Wheelchair Pulse (bpm): 82 Discharge Destination: Home Respiratory Rate (breaths/min): 18 Transportation: Private Auto Blood Pressure (mmHg): 115/76 Accompanied By: friend Schedule Follow-up Appointment: Yes Clinical Summary of Care: Electronic Signature(s) Signed: 04/05/2018 2:49:43 PM By: Arnette Norris Entered By: Arnette Norris on 04/05/2018 14:49:42 Mitchell Dixon ([id number]) -------------------------------------------------------------------------------- Lower Extremity Assessment Details Patient Name: Mitchell Dixon Date of Service: 04/05/2018 10:30 AM Medical Record Number: [id number] Patient Account Number: [id number] Date of Birth/Sex: 04-01-1955 (63 y.o. M) Treating RN: Curtis Sites Primary Care Shalandria Elsbernd: Karie Fetch Other Clinician: Referring Christinia Lambeth: Karie Fetch Treating Tavonna Worthington/Extender: Altamese [place] in Treatment: 1 Electronic Signature(s) Signed: 04/05/2018 5:48:53 PM By: Curtis Sites Entered By: Curtis Sites on 04/05/2018 11:06:46 Mitchell Dixon ([id number]) -------------------------------------------------------------------------------- Multi Wound Chart Details Patient Name: Mitchell Dixon Date of Service: 04/05/2018 10:30 AM Medical Record Number: [id number] Patient Account Number: [id number] Date of Birth/Sex: 07-Jun-1955 (63 y.o. M) Treating RN: Huel Coventry Primary Care Kevon Tench: Karie Fetch Other Clinician: Referring Daryan Cagley: Karie Fetch Treating Cassi Jenne/Extender: Altamese [place] in Treatment: 1 Vital Signs Height(in): 70 Pulse(bpm): 82 Weight(lbs): 140 Blood Pressure(mmHg): 115/76 Body Mass Index(BMI): 20 Temperature(F): 97.6 Respiratory Rate 18 (breaths/min): Photos: [[name]:[name]] Wound Location: Sacrum - Midline Right Gluteus [name] Wounding Event: Gradually Appeared Pressure Injury [name] Primary Etiology: Pressure Ulcer Pressure Ulcer [name] Comorbid  History: Arrhythmia, Deep Vein [name] [name] Thrombosis, Hypertension, History of pressure wounds, Paraplegia Date Acquired: 03/08/2018 03/08/2018 [name] Weeks of Treatment: 1 1 [name] Wound Status: Open Healed - Epithelialized [name] Measurements L x W x D 2.6x2.7x1.5 0x0x0 [name] (cm) Area (cm) : 5.513 0 [name] Volume (cm) : 8.27 0 [name] % Reduction in Area: 29.10% 100.00% [name] % Reduction in Volume: 3.30% 100.00% [name] Starting Position 1 12 (o'clock): Ending Position 1 12 (o'clock): Maximum Distance 1 (cm): 1.8 Undermining: Yes [name] [name] Classification: Category/Stage IV Category/Stage II [name] Exudate Amount: Large [name] [name] Exudate Type: Serous [name] [name] Exudate Color: amber [name] [name] Foul Odor After Cleansing: Yes [name] [name]  Odor Anticipated Due to No [name] [name] Product Use: Mitchell Dixon, Mitchell Dixon. ([id number]) Wound Margin: Flat and Intact [name] [name] Granulation Amount: Small (1-33%) [name] [name] Granulation Quality: Pink [name] [name] Necrotic Amount: Large (67-100%) [name] [name] Exposed Structures: Fat Layer (Subcutaneous [name] [name] Tissue) Exposed: Yes Muscle: Yes Fascia: No Tendon: No Joint: No Bone: No Epithelialization: None [name] [name] Debridement: Debridement - Excisional [name] [name] Pre-procedure 11:30 [name] [name] Verification/Time Out Taken: Pain Control: Lidocaine [name] [name] Tissue Debrided: Muscle, Subcutaneous, [name] [name] Slough Level: Skin/Subcutaneous [name] [name] Tissue/Muscle Debridement Area (sq cm): 7.02 [name] [name] Instrument: Curette [name] [name] Bleeding: Moderate [name] [name] Hemostasis Achieved: Pressure [name] [name] Debridement Treatment Procedure was tolerated well [name] [name] Response: Post Debridement 2.6x2.7x1.8 [name] [name] Measurements L x W x D (cm) Post Debridement Volume: 9.924 [name] [name] (cm) Post Debridement Stage: Category/Stage IV [name] [name] Periwound Skin Texture: Excoriation: No No Abnormalities Noted [name] Induration: No Callus: No Crepitus: No Rash: No Scarring: No Periwound Skin Moisture: Maceration: No No Abnormalities  Noted [name] Dry/Scaly: No Periwound Skin Color: Atrophie Blanche: No No Abnormalities Noted [name] Cyanosis: No Ecchymosis: No Erythema: No Hemosiderin Staining: No Mottled: No Pallor: No Rubor: No Temperature: No Abnormality [name] [name] Tenderness on Palpation: No No [name] Wound Preparation: Ulcer Cleansing: [name] [name] Rinsed/Irrigated with Saline Topical Anesthetic Applied: Other: lidocaine 4% Procedures Performed: Debridement [name] [name] Treatment Notes Mitchell Dixon, Mitchell Dixon. ([id number]) Electronic Signature(s) Signed: 04/05/2018 5:51:47 PM By: Baltazar Najjarobson, Michael MD Entered By: Baltazar Najjarobson, Michael on 04/05/2018 12:32:36 Mitchell Dixon, Demontay Dixon. ([id number]) -------------------------------------------------------------------------------- Multi-Disciplinary Care Plan Details Patient Name: Mitchell Dixon, Mitchell Dixon. Date of Service: 04/05/2018 10:30 AM Medical Record Number: [id number] Patient Account Number: [id number] Date of Birth/Sex: 08/02/1955 (63 y.o. M) Treating RN: Huel CoventryWoody, Kim Primary Care Ranulfo Kall: Karie FetchAYCOCK, NGWE Other Clinician: Referring Estill Llerena: Karie FetchAYCOCK, NGWE Treating Jerl Munyan/Extender: Altamese CarolinaOBSON, MICHAEL G Weeks in Treatment: 1 Active Inactive Necrotic Tissue Nursing Diagnoses: Impaired tissue integrity related to necrotic/devitalized tissue Goals: Necrotic/devitalized tissue will be minimized in the wound bed Date Initiated: 03/29/2018 Target Resolution Date: 03/29/2018 Goal Status: Active Interventions: Assess patient pain level pre-, during and post procedure and prior to discharge Treatment Activities: Apply topical anesthetic as ordered : 03/29/2018 Excisional debridement : 03/29/2018 Notes: Orientation to the Wound Care Program Nursing Diagnoses: Knowledge deficit related to the wound healing center program Goals: Patient/caregiver will verbalize understanding of the Wound Healing Center Program Date Initiated: 03/29/2018 Target Resolution Date: 03/29/2018 Goal Status:  Active Interventions: Provide education on orientation to the wound center Notes: Pressure Nursing Diagnoses: Knowledge deficit related to management of pressures ulcers Potential for impaired tissue integrity related to pressure, friction, moisture, and shear Goals: Patient/caregiver will verbalize understanding of pressure ulcer management Mitchell Dixon, Jonluke Dixon. ([id number]) Date Initiated: 03/29/2018 Target Resolution Date: 03/29/2018 Goal Status: Active Interventions: Assess: immobility, friction, shearing, incontinence upon admission and as needed Provide education on pressure ulcers Treatment Activities: Patient referred for home evaluation of offloading devices/mattresses : 03/29/2018 Notes: Soft Tissue Infection Nursing Diagnoses: Impaired tissue integrity Potential for infection: soft tissue Goals: Patient will remain free of wound infection Date Initiated: 03/29/2018 Target Resolution Date: 03/29/2018 Goal Status: Active Signs and symptoms of infection will be recognized early to allow for prompt treatment Date Initiated: 03/29/2018 Target Resolution Date: 03/29/2018 Goal Status: Active Interventions: Assess signs and symptoms of infection every visit Treatment Activities: Culture and sensitivity : 03/29/2018 Notes: Wound/Skin Impairment Nursing Diagnoses: Impaired tissue integrity Goals: Patient/caregiver will verbalize understanding of skin care regimen Date Initiated: 03/29/2018 Target Resolution Date: 03/29/2018 Goal Status: Active Ulcer/skin breakdown  will have a volume reduction of 30% by week 4 Date Initiated: 03/29/2018 Target Resolution Date: 03/29/2018 Goal Status: Active Interventions: Assess ulceration(s) every visit Treatment Activities: Patient referred to home care : 03/29/2018 Skin care regimen initiated : 03/29/2018 Mitchell Dixon, Deondrea Dixon. ([id number]) Notes: Electronic Signature(s) Signed: 04/05/2018 5:28:11 PM By: Elliot GurneyWoody, BSN, RN, CWS, Kim RN,  BSN Entered By: Elliot GurneyWoody, BSN, RN, CWS, Kim on 04/05/2018 11:30:25 Mitchell Dixon, Mitchell Dixon. ([id number]) -------------------------------------------------------------------------------- Pain Assessment Details Patient Name: Mitchell Dixon, Mitchell Dixon. Date of Service: 04/05/2018 10:30 AM Medical Record Number: [id number] Patient Account Number: [id number] Date of Birth/Sex: Jul 19, 1955 (62 y.o. M) Treating RN: Huel CoventryWoody, Kim Primary Care Amiere Cawley: Karie FetchAYCOCK, NGWE Other Clinician: Referring Talullah Abate: Karie FetchAYCOCK, NGWE Treating Renarda Mullinix/Extender: Altamese CarolinaOBSON, MICHAEL G Weeks in Treatment: 1 Active Problems Location of Pain Severity and Description of Pain Patient Has Paino No Site Locations Pain Management and Medication Current Pain Management: Electronic Signature(s) Signed: 04/05/2018 11:35:01 AM By: Dayton MartesWallace, RCP,RRT,CHT, Sallie RCP, RRT, CHT Signed: 04/05/2018 5:28:11 PM By: Elliot GurneyWoody, BSN, RN, CWS, Kim RN, BSN Entered By: Dayton MartesWallace, RCP,RRT,CHT, Sallie on 04/05/2018 10:51:24 Mitchell Dixon, Muhanad Dixon. ([id number]) -------------------------------------------------------------------------------- Patient/Caregiver Education Details Patient Name: Mitchell Dixon, Mitchell Dixon. Date of Service: 04/05/2018 10:30 AM Medical Record Number: [id number] Patient Account Number: [id number] Date of Birth/Gender: Jul 19, 1955 (62 y.o. M) Treating RN: Huel CoventryWoody, Kim Primary Care Physician: Karie FetchAYCOCK, NGWE Other Clinician: Referring Physician: Karie FetchAYCOCK, NGWE Treating Physician/Extender: Altamese CarolinaOBSON, MICHAEL G Weeks in Treatment: 1 Education Assessment Education Provided To: Patient Education Topics Provided Pressure: Handouts: Pressure Ulcers: Care and Offloading, Other: Increase protein and stay off of area Methods: Demonstration, Explain/Verbal Responses: State content correctly Wound Debridement: Handouts: Wound Debridement Methods: Demonstration, Explain/Verbal Electronic Signature(s) Signed: 04/06/2018 11:43:02 AM By: Arnette NorrisBiell, Kristina Entered By: Arnette NorrisBiell,  Kristina on 04/05/2018 14:49:58 Mitchell Dixon, Mitchell Dixon. ([id number]) -------------------------------------------------------------------------------- Wound Assessment Details Patient Name: Mitchell Dixon, Mitchell Dixon. Date of Service: 04/05/2018 10:30 AM Medical Record Number: [id number] Patient Account Number: [id number] Date of Birth/Sex: Jul 19, 1955 (63 y.o. M) Treating RN: Curtis Sitesorthy, Joanna Primary Care Farryn Linares: Karie FetchAYCOCK, NGWE Other Clinician: Referring Bronislaw Switzer: Karie FetchAYCOCK, NGWE Treating Shayann Garbutt/Extender: Altamese CarolinaOBSON, MICHAEL G Weeks in Treatment: 1 Wound Status Wound Number: 1 Primary Pressure Ulcer Etiology: Wound Location: Sacrum - Midline Wound Open Wounding Event: Gradually Appeared Status: Date Acquired: 03/08/2018 Comorbid Arrhythmia, Deep Vein Thrombosis, Weeks Of Treatment: 1 History: Hypertension, History of pressure wounds, Clustered Wound: No Paraplegia Photos Photo Uploaded By: Curtis Sitesorthy, Joanna on 04/05/2018 11:47:47 Wound Measurements Length: (cm) 2.6 Width: (cm) 2.7 Depth: (cm) 1.5 Area: (cm) 5.513 Volume: (cm) 8.27 % Reduction in Area: 29.1% % Reduction in Volume: 3.3% Epithelialization: None Tunneling: No Undermining: Yes Starting Position (o'clock): 12 Ending Position (o'clock): 12 Maximum Distance: (cm) 1.8 Wound Description Classification: Category/Stage IV Wound Margin: Flat and Intact Exudate Amount: Large Exudate Type: Serous Exudate Color: amber Foul Odor After Cleansing: Yes Due to Product Use: No Slough/Fibrino Yes Wound Bed Granulation Amount: Small (1-33%) Exposed Structure Granulation Quality: Pink Fascia Exposed: No Necrotic Amount: Large (67-100%) Fat Layer (Subcutaneous Tissue) Exposed: Yes Necrotic Quality: Adherent Slough Tendon Exposed: No Muscle Exposed: Yes Mike CrazeTERSON, Londen Dixon. ([id number]) Necrosis of Muscle: No Joint Exposed: No Bone Exposed: No Periwound Skin Texture Texture Color No Abnormalities Noted: No No Abnormalities Noted:  No Callus: No Atrophie Blanche: No Crepitus: No Cyanosis: No Excoriation: No Ecchymosis: No Induration: No Erythema: No Rash: No Hemosiderin Staining: No Scarring: No Mottled: No Pallor: No Moisture Rubor: No No Abnormalities Noted: No Dry / Scaly: No Temperature / Pain Maceration: No Temperature: No Abnormality Wound Preparation Ulcer Cleansing: Rinsed/Irrigated with Saline  Topical Anesthetic Applied: Other: lidocaine 4%, Electronic Signature(s) Signed: 04/05/2018 5:28:11 PM By: Elliot Gurney, BSN, RN, CWS, Kim RN, BSN Signed: 04/05/2018 5:48:53 PM By: Curtis Sites Entered By: Elliot Gurney BSN, RN, CWS, Kim on 04/05/2018 11:33:09 MOSSES, MCDILL ([id number]) -------------------------------------------------------------------------------- Wound Assessment Details Patient Name: Mitchell Dixon Date of Service: 04/05/2018 10:30 AM Medical Record Number: [id number] Patient Account Number: [id number] Date of Birth/Sex: [birth year] (62 y.o. M) Treating RN: Curtis Sites Primary Care Tieasha Larsen: Karie Fetch Other Clinician: Referring Ajahni Nay: Karie Fetch Treating Eleonora Peeler/Extender: Altamese [place] in Treatment: 1 Wound Status Wound Number: 2 Primary Etiology: Pressure Ulcer Wound Location: Right Gluteus Wound Status: Healed - Epithelialized Wounding Event: Pressure Injury Date Acquired: 03/08/2018 Weeks Of Treatment: 1 Clustered Wound: No Photos Photo Uploaded By: Curtis Sites on 04/05/2018 11:47:48 Wound Measurements Length: (cm) 0 Width: (cm) 0 Depth: (cm) 0 Area: (cm) 0 Volume: (cm) 0 % Reduction in Area: 100% % Reduction in Volume: 100% Wound Description Classification: Category/Stage II Periwound Skin Texture Texture Color No Abnormalities Noted: No No Abnormalities Noted: No Moisture No Abnormalities Noted: No Electronic Signature(s) Signed: 04/05/2018 5:48:53 PM By: Curtis Sites Entered By: Curtis Sites on 04/05/2018 11:14:45 Mitchell Dixon ([id number]) -------------------------------------------------------------------------------- Vitals Details Patient Name: Mitchell Dixon Date of Service: 04/05/2018 10:30 AM Medical Record Number: [id number] Patient Account Number: [id number] Date of Birth/Sex: 07/30/1955 (63 y.o. M) Treating RN: Huel Coventry Primary Care Amea Mcphail: Karie Fetch Other Clinician: Referring Dantae Meunier: Karie Fetch Treating Zaylei Mullane/Extender: Altamese [place] in Treatment: 1 Vital Signs Time Taken: 10:51 Temperature (F): 97.6 Height (in): 70 Pulse (bpm): 82 Weight (lbs): 140 Respiratory Rate (breaths/min): 18 Body Mass Index (BMI): 20.1 Blood Pressure (mmHg): 115/76 Reference Range: 80 - 120 mg / dl Airway Electronic Signature(s) Signed: 04/05/2018 11:35:01 AM By: Dayton Martes RCP, RRT, CHT Entered By: Dayton Martes on 04/05/2018 10:51:45

## 2018-04-12 ENCOUNTER — Encounter: Payer: Medicaid Other | Admitting: Internal Medicine

## 2018-04-12 DIAGNOSIS — L89154 Pressure ulcer of sacral region, stage 4: Secondary | ICD-10-CM | POA: Diagnosis not present

## 2018-04-13 ENCOUNTER — Other Ambulatory Visit: Payer: Self-pay | Admitting: Internal Medicine

## 2018-04-13 DIAGNOSIS — L89154 Pressure ulcer of sacral region, stage 4: Secondary | ICD-10-CM

## 2018-04-14 NOTE — Progress Notes (Signed)
KYHEIM, VUNCANNON ([id number]) Visit Report for 04/12/2018 Arrival Information Details Patient Name: Mitchell Dixon, Mitchell Dixon Date of Service: 04/12/2018 3:30 PM Medical Record Number: [id number] Patient Account Number: [id number] Date of Birth/Sex: Apr 03, 1955 (62 y.o. M) Treating RN: Huel Coventry Primary Care Xinyi Batton: Karie Fetch Other Clinician: Referring Sho Salguero: Karie Fetch Treating Carmen Tolliver/Extender: Altamese [place] in Treatment: 2 Visit Information History Since Last Visit Added or deleted any medications: No Patient Arrived: Wheel Chair Any new allergies or adverse reactions: No Arrival Time: 15:58 Had a fall or experienced change in No Accompanied By: brother activities of daily living that may affect Transfer Assistance: Manual risk of falls: Patient Identification Verified: Yes Signs or symptoms of abuse/neglect since last visito No Secondary Verification Process Completed: Yes Hospitalized since last visit: No Implantable device outside of the clinic excluding No cellular tissue based products placed in the center since last visit: Has Dressing in Place as Prescribed: Yes Pain Present Now: No Electronic Signature(s) Signed: 04/12/2018 5:06:18 PM By: Dayton Martes RCP, RRT, CHT Entered By: Dayton Martes on 04/12/2018 15:58:44 Mitchell Dixon ([id number]) -------------------------------------------------------------------------------- Clinic Level of Care Assessment Details Patient Name: Mitchell Dixon Date of Service: 04/12/2018 3:30 PM Medical Record Number: [id number] Patient Account Number: [id number] Date of Birth/Sex: 1956/01/16 (63 y.o. M) Treating RN: Huel Coventry Primary Care Bharat Antillon: Karie Fetch Other Clinician: Referring Peighton Mehra: Karie Fetch Treating Joseangel Nettleton/Extender: Altamese  in Treatment: 2 Clinic Level of Care Assessment Items TOOL 4 Quantity Score []  - Use when only an EandM is performed  on FOLLOW-UP visit 0 ASSESSMENTS - Nursing Assessment / Reassessment []  - Reassessment of Co-morbidities (includes updates in patient status) 0 X- 1 5 Reassessment of Adherence to Treatment Plan ASSESSMENTS - Wound and Skin Assessment / Reassessment X - Simple Wound Assessment / Reassessment - one wound 1 5 []  - 0 Complex Wound Assessment / Reassessment - multiple wounds []  - 0 Dermatologic / Skin Assessment (not related to wound area) ASSESSMENTS - Focused Assessment []  - Circumferential Edema Measurements - multi extremities 0 []  - 0 Nutritional Assessment / Counseling / Intervention []  - 0 Lower Extremity Assessment (monofilament, tuning fork, pulses) []  - 0 Peripheral Arterial Disease Assessment (using hand held doppler) ASSESSMENTS - Ostomy and/or Continence Assessment and Care []  - Incontinence Assessment and Management 0 []  - 0 Ostomy Care Assessment and Management (repouching, etc.) PROCESS - Coordination of Care X - Simple Patient / Family Education for ongoing care 1 15 []  - 0 Complex (extensive) Patient / Family Education for ongoing care []  - 0 Staff obtains Chiropractor, Records, Test Results / Process Orders []  - 0 Staff telephones HHA, Nursing Homes / Clarify orders / etc []  - 0 Routine Transfer to another Facility (non-emergent condition) []  - 0 Routine Hospital Admission (non-emergent condition) []  - 0 New Admissions / Manufacturing engineer / Ordering NPWT, Apligraf, etc. []  - 0 Emergency Hospital Admission (emergent condition) X- 1 10 Simple Discharge Coordination Mitchell Dixon, Mitchell Dixon. ([id number]) []  - 0 Complex (extensive) Discharge Coordination PROCESS - Special Needs []  - Pediatric / Minor Patient Management 0 []  - 0 Isolation Patient Management []  - 0 Hearing / Language / Visual special needs []  - 0 Assessment of Community assistance (transportation, D/C planning, etc.) []  - 0 Additional assistance / Altered mentation []  - 0 Support  Surface(s) Assessment (bed, cushion, seat, etc.) INTERVENTIONS - Wound Cleansing / Measurement X - Simple Wound Cleansing - one wound 1 5 []  - 0 Complex Wound Cleansing - multiple wounds  X- 1 5 Wound Imaging (photographs - any number of wounds) []  - 0 Wound Tracing (instead of photographs) []  - 0 Simple Wound Measurement - one wound []  - 0 Complex Wound Measurement - multiple wounds INTERVENTIONS - Wound Dressings X - Small Wound Dressing one or multiple wounds 1 10 []  - 0 Medium Wound Dressing one or multiple wounds []  - 0 Large Wound Dressing one or multiple wounds []  - 0 Application of Medications - topical []  - 0 Application of Medications - injection INTERVENTIONS - Miscellaneous []  - External ear exam 0 []  - 0 Specimen Collection (cultures, biopsies, blood, body fluids, etc.) []  - 0 Specimen(s) / Culture(s) sent or taken to Lab for analysis X- 1 10 Patient Transfer (multiple staff / Nurse, adult / Similar devices) []  - 0 Simple Staple / Suture removal (25 or less) []  - 0 Complex Staple / Suture removal (26 or more) []  - 0 Hypo / Hyperglycemic Management (close monitor of Blood Glucose) []  - 0 Ankle / Brachial Index (ABI) - do not check if billed separately X- 1 5 Vital Signs Yerkes, Tremaine N. ([id number]) Has the patient been seen at the hospital within the last three years: Yes Total Score: 70 Level Of Care: New/Established - Level 2 Electronic Signature(s) Signed: 04/13/2018 8:43:41 AM By: Elliot Gurney, BSN, RN, CWS, Kim RN, BSN Entered By: Elliot Gurney, BSN, RN, CWS, Kim on 04/12/2018 17:44:54 Mitchell Dixon ([id number]) -------------------------------------------------------------------------------- Encounter Discharge Information Details Patient Name: Mitchell Dixon Date of Service: 04/12/2018 3:30 PM Medical Record Number: [id number] Patient Account Number: [id number] Date of Birth/Sex: 04/30/1955 (62 y.o. M) Treating RN: Huel Coventry Primary Care Birdia Jaycox:  Karie Fetch Other Clinician: Referring Carmel Garfield: Karie Fetch Treating Laneya Gasaway/Extender: Altamese [place] in Treatment: 2 Encounter Discharge Information Items Discharge Condition: Stable Ambulatory Status: Wheelchair Discharge Destination: Home Transportation: Private Auto Accompanied By: brother Schedule Follow-up Appointment: Yes Clinical Summary of Care: Electronic Signature(s) Signed: 04/12/2018 5:46:01 PM By: Elliot Gurney, BSN, RN, CWS, Kim RN, BSN Entered By: Elliot Gurney, BSN, RN, CWS, Kim on 04/12/2018 17:46:00 Mitchell Dixon ([id number]) -------------------------------------------------------------------------------- Lower Extremity Assessment Details Patient Name: Mitchell Dixon Date of Service: 04/12/2018 3:30 PM Medical Record Number: [id number] Patient Account Number: [id number] Date of Birth/Sex: 06-18-1955 (62 y.o. M) Treating RN: Rema Jasmine Primary Care Torryn Hudspeth: Karie Fetch Other Clinician: Referring Sharnee Douglass: Karie Fetch Treating Rosenda Geffrard/Extender: Altamese [place] in Treatment: 2 Electronic Signature(s) Signed: 04/12/2018 5:11:19 PM By: Rema Jasmine Entered By: Rema Jasmine on 04/12/2018 16:46:27 Mitchell Dixon ([id number]) -------------------------------------------------------------------------------- Multi Wound Chart Details Patient Name: Mitchell Dixon Date of Service: 04/12/2018 3:30 PM Medical Record Number: [id number] Patient Account Number: [id number] Date of Birth/Sex: [birth year] (62 y.o. M) Treating RN: Huel Coventry Primary Care Samreet [place]field: Karie Fetch Other Clinician: Referring Nealie Mchatton: Karie Fetch Treating Erianna Jolly/Extender: Altamese [place] in Treatment: 2 Vital Signs Height(in): 70 Pulse(bpm): 72 Weight(lbs): 140 Blood Pressure(mmHg): 141/85 Body Mass Index(BMI): 20 Temperature(F): 97.8 Respiratory Rate 18 (breaths/min): Photos: [1:No Photos] [N/A:N/A] Wound Location: [1:Sacrum - Midline] [N/A:N/A] Wounding  Event: [1:Gradually Appeared] [N/A:N/A] Primary Etiology: [1:Pressure Ulcer] [N/A:N/A] Comorbid History: [1:Arrhythmia, Deep Vein Thrombosis, Hypertension, History of pressure wounds, Paraplegia] [N/A:N/A] Date Acquired: [1:03/08/2018] [N/A:N/A] Weeks of Treatment: [1:2] [N/A:N/A] Wound Status: [1:Open] [N/A:N/A] Measurements L x W x D [1:2x3x1] [N/A:N/A] (cm) Area (cm) : [1:4.712] [N/A:N/A] Volume (cm) : [1:4.712] [N/A:N/A] % Reduction in Area: [1:39.40%] [N/A:N/A] % Reduction in Volume: [1:44.90%] [N/A:N/A] Starting Position 1 [1:12] (o'clock): Ending Position 1 [1:1] (o'clock): Maximum Distance 1 (cm): [1:1.5] Starting  Position 2 [1:6] (o'clock): Ending Position 2 [1:7] (o'clock): Maximum Distance 2 (cm): [1:1.1] Undermining: [1:Yes] [N/A:N/A] Classification: [1:Category/Stage IV] [N/A:N/A] Exudate Amount: [1:Large] [N/A:N/A] Exudate Type: [1:Serous] [N/A:N/A] Exudate Color: [1:amber] [N/A:N/A] Foul Odor After Cleansing: [1:Yes] [N/A:N/A] Odor Anticipated Due to [1:No] [N/A:N/A] Product Use: Wound Margin: [1:Flat and Intact] [N/A:N/A] Granulation Amount: [1:None Present (0%)] [N/A:N/A] Necrotic Amount: Medium (34-66%) N/A N/A Necrotic Tissue: Eschar, Adherent Slough N/A N/A Exposed Structures: Fat Layer (Subcutaneous N/A N/A Tissue) Exposed: Yes Muscle: Yes Fascia: No Tendon: No Joint: No Bone: No Epithelialization: None N/A N/A Periwound Skin Texture: Excoriation: No N/A N/A Induration: No Callus: No Crepitus: No Rash: No Scarring: No Periwound Skin Moisture: Maceration: No N/A N/A Dry/Scaly: No Periwound Skin Color: Atrophie Blanche: No N/A N/A Cyanosis: No Ecchymosis: No Erythema: No Hemosiderin Staining: No Mottled: No Pallor: No Rubor: No Temperature: No Abnormality N/A N/A Tenderness on Palpation: No N/A N/A Wound Preparation: Ulcer Cleansing: N/A N/A Rinsed/Irrigated with Saline Topical Anesthetic Applied: Other: lidocaine 4% Treatment  Notes Wound #1 (Midline Sacrum) Notes silvercel, bordered foam dressing Electronic Signature(s) Signed: 04/12/2018 6:19:03 PM By: Baltazar Najjar MD Previous Signature: 04/12/2018 5:22:50 PM Version By: Elliot Gurney, BSN, RN, CWS, Kim RN, BSN Entered By: Baltazar Najjar on 04/12/2018 18:00:08 Mitchell Dixon, Mitchell Dixon ([id number]) -------------------------------------------------------------------------------- Multi-Disciplinary Care Plan Details Patient Name: Mitchell Dixon Date of Service: 04/12/2018 3:30 PM Medical Record Number: [id number] Patient Account Number: [id number] Date of Birth/Sex: [birth year] (63 y.o. M) Treating RN: Huel Coventry Primary Care Latrish Mogel: Karie Fetch Other Clinician: Referring Thania Woodlief: Karie Fetch Treating Anselm Aumiller/Extender: Altamese [place] in Treatment: 2 Active Inactive Necrotic Tissue Nursing Diagnoses: Impaired tissue integrity related to necrotic/devitalized tissue Goals: Necrotic/devitalized tissue will be minimized in the wound bed Date Initiated: 03/29/2018 Target Resolution Date: 03/29/2018 Goal Status: Active Interventions: Assess patient pain level pre-, during and post procedure and prior to discharge Treatment Activities: Apply topical anesthetic as ordered : 03/29/2018 Excisional debridement : 03/29/2018 Notes: Orientation to the Wound Care Program Nursing Diagnoses: Knowledge deficit related to the wound healing center program Goals: Patient/caregiver will verbalize understanding of the Wound Healing Center Program Date Initiated: 03/29/2018 Target Resolution Date: 03/29/2018 Goal Status: Active Interventions: Provide education on orientation to the wound center Notes: Pressure Nursing Diagnoses: Knowledge deficit related to management of pressures ulcers Potential for impaired tissue integrity related to pressure, friction, moisture, and shear Goals: Patient/caregiver will verbalize understanding of pressure ulcer  management Mitchell Dixon, Mitchell Dixon ([id number]) Date Initiated: 03/29/2018 Target Resolution Date: 03/29/2018 Goal Status: Active Interventions: Assess: immobility, friction, shearing, incontinence upon admission and as needed Provide education on pressure ulcers Treatment Activities: Patient referred for home evaluation of offloading devices/mattresses : 03/29/2018 Notes: Soft Tissue Infection Nursing Diagnoses: Impaired tissue integrity Potential for infection: soft tissue Goals: Patient will remain free of wound infection Date Initiated: 03/29/2018 Target Resolution Date: 03/29/2018 Goal Status: Active Signs and symptoms of infection will be recognized early to allow for prompt treatment Date Initiated: 03/29/2018 Target Resolution Date: 03/29/2018 Goal Status: Active Interventions: Assess signs and symptoms of infection every visit Treatment Activities: Culture and sensitivity : 03/29/2018 Notes: Wound/Skin Impairment Nursing Diagnoses: Impaired tissue integrity Goals: Patient/caregiver will verbalize understanding of skin care regimen Date Initiated: 03/29/2018 Target Resolution Date: 03/29/2018 Goal Status: Active Ulcer/skin breakdown will have a volume reduction of 30% by week 4 Date Initiated: 03/29/2018 Target Resolution Date: 03/29/2018 Goal Status: Active Interventions: Assess ulceration(s) every visit Treatment Activities: Patient referred to home care : 03/29/2018 Skin care regimen initiated : 03/29/2018 Mitchell Craze  Dorris CarnesN. ([id number]) Notes: Electronic Signature(s) Signed: 04/12/2018 5:22:43 PM By: Elliot GurneyWoody, BSN, RN, CWS, Kim RN, BSN Entered By: Elliot GurneyWoody, BSN, RN, CWS, Kim on 04/12/2018 17:22:43 Mitchell GearTERSON, Oscar N. ([id number]) -------------------------------------------------------------------------------- Pain Assessment Details Patient Name: Mitchell Dixon, Mitchell N. Date of Service: 04/12/2018 3:30 PM Medical Record Number: [id number] Patient Account Number:  [id number] Date of Birth/Sex: Feb 18, 1956 (62 y.o. M) Treating RN: Huel CoventryWoody, Kim Primary Care Karianna Gusman: Karie FetchAYCOCK, NGWE Other Clinician: Referring Kenon Delashmit: Karie FetchAYCOCK, NGWE Treating Lonzie Simmer/Extender: Altamese CarolinaOBSON, MICHAEL G Weeks in Treatment: 2 Active Problems Location of Pain Severity and Description of Pain Patient Has Paino No Site Locations Pain Management and Medication Current Pain Management: Electronic Signature(s) Signed: 04/12/2018 5:06:18 PM By: Dayton MartesWallace, RCP,RRT,CHT, Sallie RCP, RRT, CHT Signed: 04/13/2018 8:43:41 AM By: Elliot GurneyWoody, BSN, RN, CWS, Kim RN, BSN Entered By: Dayton MartesWallace, RCP,RRT,CHT, Sallie on 04/12/2018 15:58:52 Mitchell Dixon, Mitchell N. ([id number]) -------------------------------------------------------------------------------- Patient/Caregiver Education Details Patient Name: Mitchell Dixon, Mitchell N. Date of Service: 04/12/2018 3:30 PM Medical Record Number: [id number] Patient Account Number: [id number] Date of Birth/Gender: Feb 18, 1956 (62 y.o. M) Treating RN: Huel CoventryWoody, Kim Primary Care Physician: Karie FetchAYCOCK, NGWE Other Clinician: Referring Physician: Karie FetchAYCOCK, NGWE Treating Physician/Extender: Altamese CarolinaOBSON, MICHAEL G Weeks in Treatment: 2 Education Assessment Education Provided To: Patient Education Topics Provided Pressure: Handouts: Pressure Ulcers: Care and Offloading Methods: Explain/Verbal Responses: State content correctly Wound/Skin Impairment: Handouts: Caring for Your Ulcer Methods: Explain/Verbal Responses: State content correctly Electronic Signature(s) Signed: 04/13/2018 8:43:41 AM By: Elliot GurneyWoody, BSN, RN, CWS, Kim RN, BSN Entered By: Elliot GurneyWoody, BSN, RN, CWS, Kim on 04/12/2018 17:45:24 Mitchell Dixon, Mitchell N. ([id number]) -------------------------------------------------------------------------------- Wound Assessment Details Patient Name: Mitchell Dixon, Mitchell N. Date of Service: 04/12/2018 3:30 PM Medical Record Number: [id number] Patient Account Number: [id number] Date of Birth/Sex: Feb 18, 1956 (62  y.o. M) Treating RN: Rema JasmineNg, Wendi Primary Care Vianey Caniglia: Karie FetchAYCOCK, NGWE Other Clinician: Referring Shloimy Michalski: Karie FetchAYCOCK, NGWE Treating Asta Corbridge/Extender: Altamese CarolinaOBSON, MICHAEL G Weeks in Treatment: 2 Wound Status Wound Number: 1 Primary Pressure Ulcer Etiology: Wound Location: Sacrum - Midline Wound Open Wounding Event: Gradually Appeared Status: Date Acquired: 03/08/2018 Comorbid Arrhythmia, Deep Vein Thrombosis, Weeks Of Treatment: 2 History: Hypertension, History of pressure wounds, Clustered Wound: No Paraplegia Photos Photo Uploaded By: Rodell PernaScott, Dajea on 04/13/2018 08:41:45 Wound Measurements Length: (cm) 2 % Reduct Width: (cm) 3 % Reduct Depth: (cm) 1 Epitheli Area: (cm) 4.712 Tunneli Volume: (cm) 4.712 Undermi Loc[address] En Ma Locat St En Ma ion in Area: 39.4% ion in Volume: 44.9% alization: None ng: No ning: Yes ion 1 arting Position (o'clock): 12 ding Position (o'clock): 1 ximum Distance: (cm) 1.5 ion 2 arting Position (o'clock): 6 ding Position (o'clock): 7 ximum Distance: (cm) 1.1 Wound Description Classification: Category/Stage IV Foul Odo Wound Margin: Flat and Intact Due to P Exudate Amount: Large Slough/F Exudate Type: Serous Exudate Color: amber r After Cleansing: Yes roduct Use: No ibrino Yes Wound Bed Mitchell GearTERSON, Diontay N. ([id number]) Granulation Amount: None Present (0%) Exposed Structure Necrotic Amount: Medium (34-66%) Fascia Exposed: No Necrotic Quality: Eschar, Adherent Slough Fat Layer (Subcutaneous Tissue) Exposed: Yes Tendon Exposed: No Muscle Exposed: Yes Necrosis of Muscle: No Joint Exposed: No Bone Exposed: No Periwound Skin Texture Texture Color No Abnormalities Noted: No No Abnormalities Noted: No Callus: No Atrophie Blanche: No Crepitus: No Cyanosis: No Excoriation: No Ecchymosis: No Induration: No Erythema: No Rash: No Hemosiderin Staining: No Scarring: No Mottled: No Pallor: No Moisture Rubor: No No Abnormalities  Noted: No Dry / Scaly: No Temperature / Pain Maceration: No Temperature: No Abnormality Wound Preparation Ulcer Cleansing: Rinsed/Irrigated with Saline Topical Anesthetic Applied: Other: lidocaine 4%, Treatment  Notes Wound #1 (Midline Sacrum) Notes silvercel, bordered foam dressing Electronic Signature(s) Signed: 04/12/2018 5:11:19 PM By: Rema JasmineNg, Wendi Entered By: Rema JasmineNg, Wendi on 04/12/2018 16:52:20 Mitchell Dixon, Acelin N. ([id number]) -------------------------------------------------------------------------------- Vitals Details Patient Name: Mitchell Dixon, Trase N. Date of Service: 04/12/2018 3:30 PM Medical Record Number: [id number] Patient Account Number: [id number] Date of Birth/Sex: September 29, 1955 (62 y.o. M) Treating RN: Huel CoventryWoody, Kim Primary Care Hazelene Doten: Karie FetchAYCOCK, NGWE Other Clinician: Referring Katrina Brosh: Karie FetchAYCOCK, NGWE Treating Allysha Tryon/Extender: Altamese CarolinaOBSON, MICHAEL G Weeks in Treatment: 2 Vital Signs Time Taken: 15:58 Temperature (F): 97.8 Height (in): 70 Pulse (bpm): 72 Weight (lbs): 140 Respiratory Rate (breaths/min): 18 Body Mass Index (BMI): 20.1 Blood Pressure (mmHg): 141/85 Reference Range: 80 - 120 mg / dl Airway Electronic Signature(s) Signed: 04/12/2018 5:06:18 PM By: Dayton MartesWallace, RCP,RRT,CHT, Sallie RCP, RRT, CHT Entered By: Dayton MartesWallace, RCP,RRT,CHT, Sallie on 04/12/2018 16:00:29

## 2018-04-14 NOTE — Progress Notes (Signed)
BERGE, BOURNES ([id number]) Visit Report for 04/12/2018 HPI Details Patient Name: Mitchell Dixon, Mitchell Dixon Date of Service: 04/12/2018 3:30 PM Medical Record Number: [id number] Patient Account Number: [id number] Date of Birth/Sex: 18-May-1955 (63 y.o. 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 He has been followed at the [facility] wound care clinic/podiatry for wounds on his bilateral feet. He has home health. We did not look at these today. Over the last 3 weeks they noticed a small area on his sacrum which has matured into a fairly substantial pressure ulcer. He is here for our review of this. 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 He also required an embolectomy and fasciectomy of the right leg and he currently has wounds on the right heel and left ankle but as stated these are being followed at [facility]. 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 X-ray we did last week did not show evidence of acute osteomyelitis in the sacrum. The patient has a pacemaker and will need a CT scan of this area to exclude osteomyelitis. Swab culture I did showed a few Enterococcus faecalis and a few enterococcus avium I  started him on Augmentin 875 twice daily for 10 days on 2/3 2/12; patient is still on Augmentin but he should finish up before I see him next week. We have had some trouble with Medicaid and CT scan authorization. The wound is probably actually on S4-S5 so we will ask for a CT scan of this area rather than the in the pelvis. We have been using silver alginate as the primary dressing in the wound is cleaning up quite nicely Electronic Signature(s) Signed: 04/12/2018 6:19:03 PM By: Baltazar Najjar MD Entered By: Baltazar Najjar on 04/12/2018 18:00:55 Mitchell Dixon ([id number]) -------------------------------------------------------------------------------- Physical Exam Details Patient Name: Mitchell Dixon Date of Service: 04/12/2018 3:30 PM Medical Record Number: [id number] Patient Account Number: [id number] Date of Birth/Sex: 1955-05-31 (62 y.o. M) Treating RN: Huel Coventry Primary Care Provider: Karie Fetch Other Clinician: Referring Provider: Karie Fetch Treating Provider/Extender: Altamese [place] in Treatment: 2 Constitutional Sitting or standing Blood Pressure is within target range for patient.. Pulse regular and within target range for patient.Marland Kitchen Respirations regular, non-labored and within target range.. Temperature is normal and within the target range for the patient.Marland Kitchen appears in no distress. Eyes Conjunctivae clear. No discharge. Respiratory Respiratory effort is easy and symmetric bilaterally. Rate is normal at rest and on room air.. Cardiovascular Appears well-hydrated. Gastrointestinal (GI) Abdomen is soft and non-distended without masses or tenderness. Bowel sounds active in all quadrants.. No liver or spleen enlargement or tenderness.Marland Kitchen Psychiatric No evidence of depression, anxiety, or agitation. Calm, cooperative, and communicative. Appropriate interactions and affect.. Notes Wound exam; the area in question is on the lower sacrum.  Dimensions are  about the same and there is 1.5 cm of superior undermining at 12:00. There is also undermining from roughly 8-12 is well. The surface of the wound looks better. There is still some debris but no debridement was done. No evidence of surrounding infection Electronic Signature(s) Signed: 04/12/2018 6:19:03 PM By: Baltazar Najjar MD Entered By: Baltazar Najjar on 04/12/2018 18:02:26 Mitchell Dixon, Mitchell Dixon ([id number]) -------------------------------------------------------------------------------- Physician Orders Details Patient Name: Mitchell Dixon Date of Service: 04/12/2018 3:30 PM Medical Record Number: [id number] Patient Account Number: [id number] Date of Birth/Sex: [birth year] (63 y.o. M) Treating RN: Huel Coventry Primary Care Provider: Karie Fetch Other Clinician: Referring Provider: Karie Fetch Treating Provider/Extender: Altamese [place] in Treatment: 2 Verbal / Phone Orders: No Diagnosis Coding Wound Cleansing Wound #1 Midline Sacrum o Clean wound with Normal Saline. Anesthetic (add to Medication List) Wound #1 Midline Sacrum o Topical Lidocaine 4% cream applied to wound bed prior to debridement (In Clinic Only). Primary Wound Dressing Wound #1 Midline Sacrum o Silver Alginate Secondary Dressing Wound #1 Midline Sacrum o Boardered Foam Dressing Dressing Change Frequency Wound #1 Midline Sacrum o Change Dressing Monday, Wednesday, Friday Follow-up Appointments Wound #1 Midline Sacrum o Return Appointment in 1 week. Off-Loading Wound #1 Midline Sacrum o Mattress o Turn and reposition every 2 hours Home Health Wound #1 Midline Sacrum o Continue Home Health Visits - [facility] o Home Health Nurse may visit PRN to address patientos wound care needs. o FACE TO FACE ENCOUNTER: MEDICARE and MEDICAID PATIENTS: I certify that this patient is under my care and that I had a face-to-face encounter that meets the physician face-to-face  encounter requirements with this patient on this date. The encounter with the patient was in whole or in part for the following MEDICAL CONDITION: (primary reason for Home Healthcare) MEDICAL NECESSITY: I certify, that based on my findings, NURSING services are a medically necessary home health service. HOME BOUND STATUS: I certify that my clinical findings support that this patient is homebound (i.e., Due to illness or injury, pt requires aid of supportive devices such as crutches, cane, wheelchairs, walkers, the use of special transportation or the assistance of another person to leave their place of residence. There is a normal inability to leave the home Mitchell Dixon, Mitchell Dixon. ([id number]) and doing so requires considerable and taxing effort. Other absences are for medical reasons / religious services and are infrequent or of short duration when for other reasons). o If current dressing causes regression in wound condition, may D/C ordered dressing product/s and apply Normal Saline Moist Dressing daily until next Wound Healing Center / Other MD appointment. Notify Wound Healing Center of regression in wound condition at [phone]. o Please direct any NON-WOUND related issues/requests for orders to patient's Primary Care Physician Medications-please add to medication list. Wound #1 Midline Sacrum o P.O. Antibiotics - continue Electronic Signature(s) Signed: 04/12/2018 5:23:16 PM By: Elliot Gurney, BSN, RN, CWS, Kim RN, BSN Signed: 04/12/2018 6:19:03 PM By: Baltazar Najjar MD Entered By: Elliot Gurney, BSN, RN, CWS, Kim on 04/12/2018 17:23:15 Mitchell Dixon, Mitchell Dixon ([id number]) -------------------------------------------------------------------------------- Problem List Details Patient Name: Mitchell Dixon Date of Service: 04/12/2018 3:30 PM Medical Record Number: [id number] Patient Account Number: [id number] Date of Birth/Sex: Jun 03, 1955 (63 y.o. M) Treating RN: Huel Coventry Primary Care Provider:  Karie Fetch Other Clinician: Referring Provider: Karie Fetch Treating Provider/Extender: Altamese [place] in Treatment: 2 Active Problems ICD-10 Evaluated Encounter Code Description Active Date Today Diagnosis L89.154 Pressure ulcer of sacral region, stage 4  03/29/2018 No Yes L89.312 Pressure ulcer of right buttock, stage 2 03/29/2018 No Yes G82.21 Paraplegia, complete 03/29/2018 No Yes Inactive Problems Resolved Problems Electronic Signature(s) Signed: 04/12/2018 6:19:03 PM By: Baltazar Najjar MD Entered By: Baltazar Najjar on 04/12/2018 17:59:49 Mitchell Dixon, Mitchell Dixon ([id number]) -------------------------------------------------------------------------------- Progress Note Details Patient Name: Mitchell Dixon Date of Service: 04/12/2018 3:30 PM Medical Record Number: [id number] Patient Account Number: [id number] Date of Birth/Sex: Mar 16, 1955 (63 y.o. 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 He has been followed at the [facility] wound care clinic/podiatry for wounds on his bilateral feet. He has home health. We did not look at these today. Over the last 3 weeks they noticed a small area on his sacrum which has matured into a fairly substantial pressure ulcer. He is here for our review of this. 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 He also required an embolectomy and fasciectomy of the right leg and he currently has wounds on the right heel and left ankle  but as stated these are being followed at [facility]. 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 X-ray we did last week did not show evidence of acute osteomyelitis in the sacrum. The patient has a pacemaker and will need a CT scan of this area to exclude osteomyelitis. Swab culture I did showed a few Enterococcus faecalis and a few enterococcus avium I started him on Augmentin 875 twice daily for 10 days on 2/3 2/12; patient is still on Augmentin but he should finish up before I see him next week. We have had some trouble with Medicaid and CT scan authorization. The wound is probably actually on S4-S5 so we will ask for a CT scan of this area rather than the in the pelvis. We have been using silver alginate as the primary dressing in the wound is cleaning up quite nicely Objective Constitutional Sitting or standing Blood Pressure is within target range for patient.. Pulse regular and within target range for patient.Marland Kitchen Respirations regular, non-labored and within target range.. Temperature is normal and within the target range for the patient.Marland Kitchen appears in no distress. Vitals Time Taken: 3:58 PM, Height: 70 in, Weight: 140 lbs, BMI: 20.1, Temperature: 97.8 F, Pulse: 72 bpm, Respiratory Rate: 18 breaths/min, Blood Pressure: 141/85 mmHg. Eyes Conjunctivae clear. No discharge. Mitchell Dixon, Mitchell Dixon ([id number]) Respiratory Respiratory effort is easy and symmetric bilaterally. Rate is normal at rest and on room air.. Cardiovascular Appears well-hydrated. Gastrointestinal (GI) Abdomen is soft and non-distended without masses or tenderness. Bowel sounds active in all quadrants.. No liver or spleen enlargement or  tenderness.Marland Kitchen Psychiatric No evidence of depression, anxiety, or agitation. Calm, cooperative, and communicative. Appropriate interactions and affect.. General Notes: Wound exam; the area in question is on the lower sacrum. Dimensions are  about the same and there is 1.5 cm of superior undermining at 12:00. There is also undermining from roughly 8-12 is well. The surface of the wound looks better. There is still some debris but no debridement was done. No evidence of surrounding infection Integumentary (Hair, Skin) Wound #1 status is Open. Original cause of wound was Gradually Appeared. The wound is located on the Midline Sacrum. The wound measures 2cm length x 3cm width x 1cm depth; 4.712cm^2 area and 4.712cm^3 volume. There is muscle and Fat Layer (Subcutaneous Tissue) Exposed exposed. There is no tunneling noted, however, there is undermining starting at 12:00 and ending at 1:00 with a maximum distance of 1.5cm. There is additional undermining and at 6:00 and ending at 7:00 with a maximum distance of 1.1cm. There is a large amount of serous drainage noted. Foul odor after cleansing was noted. The wound margin is flat and intact. There is no granulation within the wound bed. There is a medium (34-66%) amount of necrotic tissue within the wound bed including Eschar and Adherent Slough. The periwound skin appearance did not exhibit: Callus, Crepitus, Excoriation, Induration, Rash, Scarring, Dry/Scaly, Maceration, Atrophie Blanche, Cyanosis, Ecchymosis, Hemosiderin Staining, Mottled, Pallor, Rubor, Erythema. Periwound temperature was noted as No Abnormality. Assessment Active Problems ICD-10 Pressure ulcer of sacral region, stage 4 Pressure ulcer of right buttock, stage 2 Paraplegia, complete Plan Wound Cleansing: Wound #1 Midline Sacrum: Clean wound with Normal Saline. Anesthetic (add to Medication List): Wound #1 Midline Sacrum: Topical Lidocaine 4% cream applied to wound bed prior to  debridement (In Clinic Only). Primary Wound Dressing: Wound #1 Midline Sacrum: Silver Alginate Secondary Dressing: Mitchell Dixon, Mitchell Dixon ([id number]) Wound #1 Midline Sacrum: Boardered Foam Dressing Dressing Change Frequency: Wound #1 Midline Sacrum: Change Dressing Monday, Wednesday, Friday Follow-up Appointments: Wound #1 Midline Sacrum: Return Appointment in 1 week. Off-Loading: Wound #1 Midline Sacrum: Mattress Turn and reposition every 2 hours Home Health: Wound #1 Midline Sacrum: Continue Home Health Visits - [facility] Nurse may visit PRN to address patient s wound care needs. FACE TO FACE ENCOUNTER: MEDICARE and MEDICAID PATIENTS: I certify that this patient is under my care and that I had a face-to-face encounter that meets the physician face-to-face encounter requirements with this patient on this date. The encounter with the patient was in whole or in part for the following MEDICAL CONDITION: (primary reason for Home Healthcare) MEDICAL NECESSITY: I certify, that based on my findings, NURSING services are a medically necessary home health service. HOME BOUND STATUS: I certify that my clinical findings support that this patient is homebound (i.e., Due to illness or injury, pt requires aid of supportive devices such as crutches, cane, wheelchairs, walkers, the use of special transportation or the assistance of another person to leave their place of residence. There is a normal inability to leave the home and doing so requires considerable and taxing effort. Other absences are for medical reasons / religious services and are infrequent or of short duration when for other reasons). If current dressing causes regression in wound condition, may D/C ordered dressing product/s and apply Normal Saline Moist Dressing daily until next Wound Healing Center / Other MD appointment. Notify Wound Healing Center of regression in wound condition at [phone]. Please  direct any NON-WOUND related issues/requests for orders to patient's Primary Care Physician Medications-please add to medication list.: Wound #1 Midline Sacrum: P.O. Antibiotics - continue 1. I am going to continue silver alginate for another week. Then depending on the condition of the  wound surface probably silver collagen with covering wet to dry or perhaps Santyl with covering wet to dry. He also might benefit from a wound VAC 2. CT scan of the lower sacrum was ordered to look at the underlying bone in this area [facility] Electronic Signature(s) Signed: 04/12/2018 6:19:03 PM By: Baltazar Najjarobson, Carmisha Larusso MD Entered By: Baltazar Najjarobson, Chanin Frumkin on 04/12/2018 18:03:26 Mitchell Dixon, Mitchell N. ([id number]) -------------------------------------------------------------------------------- SuperBill Details Patient Name: Mitchell Dixon, Mitchell N. Date of Service: 04/12/2018 Medical Record Number: [id number] Patient Account Number: [id number] Date of Birth/Sex: 1956/01/07 (63 y.o. M) Treating RN: Huel CoventryWoody, Kim Primary Care Provider: Karie FetchAYCOCK, NGWE Other Clinician: Referring Provider: Karie FetchAYCOCK, NGWE Treating Provider/Extender: Altamese CarolinaOBSON, Jerimah Witucki G Weeks in Treatment: 2 Diagnosis Coding ICD-10 Codes Code Description L89.154 Pressure ulcer of sacral region, stage 4 L89.312 Pressure ulcer of right buttock, stage 2 G82.21 Paraplegia, complete Facility Procedures CPT4 Code: [id number] Description: [id number] - WOUND CARE VISIT-LEV 2 EST PT Modifier: Quantity: 1 Physician Procedures CPT4 Code: [id number] Description: 99213 - WC PHYS LEVEL 3 - EST PT ICD-10 Diagnosis Description L89.154 Pressure ulcer of sacral region, stage 4 G82.21 Paraplegia, complete Modifier: Quantity: 1 Electronic Signature(s) Signed: 04/12/2018 6:19:03 PM By: Baltazar Najjarobson, Jahmeek Shirk MD Entered By: Baltazar Najjarobson, Toree Edling on 04/12/2018 18:03:47

## 2018-04-18 ENCOUNTER — Ambulatory Visit: Admission: RE | Admit: 2018-04-18 | Payer: Medicaid Other | Source: Ambulatory Visit

## 2018-04-19 ENCOUNTER — Encounter: Payer: Medicaid Other | Admitting: Internal Medicine

## 2018-04-19 DIAGNOSIS — L89154 Pressure ulcer of sacral region, stage 4: Secondary | ICD-10-CM | POA: Diagnosis not present

## 2018-04-25 ENCOUNTER — Ambulatory Visit
Admission: RE | Admit: 2018-04-25 | Discharge: 2018-04-25 | Disposition: A | Discharge status: Home / Self Care | Payer: Medicaid Other | Source: Ambulatory Visit | Attending: Internal Medicine | Admitting: Internal Medicine

## 2018-04-25 DIAGNOSIS — L89154 Pressure ulcer of sacral region, stage 4: Secondary | ICD-10-CM | POA: Insufficient documentation

## 2018-04-25 LAB — POCT I-STAT CREATININE: Creatinine, Ser: 1.1 mg/dL (ref 0.61–1.24)

## 2018-04-25 MED ORDER — IOHEXOL 350 MG/ML SOLN
75.0000 mL | Freq: Once | INTRAVENOUS | Status: AC | PRN
  Administered 2018-04-25: 75 mL via INTRAVENOUS

## 2018-04-25 NOTE — Progress Notes (Signed)
TADARIUS, AYLES ([id number]) Visit Report for 04/19/2018 HPI Details Patient Name: Mitchell Dixon, Mitchell Dixon Date of Service: 04/19/2018 9:45 AM Medical Record Number: [id number] Patient Account Number: [id number] Date of Birth/Sex: 09/20/1955 (63 y.o. 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 He has been followed at the [facility] wound care clinic/podiatry for wounds on his bilateral feet. He has home health. We did not look at these today. Over the last 3 weeks they noticed a small area on his sacrum which has matured into a fairly substantial pressure ulcer. He is here for our review of this. 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 He also required an embolectomy and fasciectomy of the right leg and he currently has wounds on the right heel and left ankle but as stated these are being followed at [facility]. 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 X-ray we did last week did not show evidence of acute osteomyelitis in the sacrum. The patient has a pacemaker and will need a CT scan of this area to exclude osteomyelitis. Swab culture I did showed a few Enterococcus faecalis and a few enterococcus avium I  started him on Augmentin 875 twice daily for 10 days on 2/3 2/12; patient is still on Augmentin but he should finish up before I see him next week. We have had some trouble with Medicaid and CT scan authorization. The wound is probably actually on S4-S5 so we will ask for a CT scan of this area rather than the in the pelvis. We have been using silver alginate as the primary dressing in the wound is cleaning up quite nicely 2/19; 2/19; the patient has completed antibiotics. His CT scan is booked for 2/25; we have been using silver alginate. Electronic Signature(s) Signed: 04/19/2018 6:18:32 PM By: Baltazar Najjar MD Entered By: Baltazar Najjar on 04/19/2018 10:59:20 Mitchell Dixon ([id number]) -------------------------------------------------------------------------------- Physical Exam Details Patient Name: Mitchell Dixon Date of Service: 04/19/2018 9:45 AM Medical Record Number: [id number] Patient Account Number: [id number] Date of Birth/Sex: 10/18/1955 (63 y.o. M) Treating RN: Huel Coventry Primary Care Provider: Karie Fetch Other Clinician: Referring Provider: Karie Fetch Treating Provider/Extender: Altamese [place] in Treatment: 3 Constitutional Sitting or standing Blood Pressure is within target range for patient.. Pulse regular and within target range for patient.Marland Kitchen Respirations regular, non-labored and within target range.. Temperature is normal and within the target range for the patient.. Eyes Conjunctivae clear. No discharge. Respiratory Respiratory effort is easy and symmetric bilaterally. Rate is normal at rest and on room air.. Bilateral breath sounds are clear and equal in all lobes with no wheezes, rales or rhonchi.. Cardiovascular Heart rhythm and rate regular, without murmur or gallop.. Gastrointestinal (GI) Abdomen is soft and non-distended without masses or tenderness. Bowel sounds  active in all quadrants.. No liver or spleen enlargement or  tenderness.. Lymphatic . Psychiatric No evidence of depression, anxiety, or agitation. Calm, cooperative, and communicative. Appropriate interactions and affect.. Notes Wound exam; the area in question is on the lower sacrum. Dimensions of the wound perhaps slightly larger areas 2.5 cm of superior tunneling at 12:00 versus 1.5 last time. There is no purulent drainage no evidence of soft tissue infection around the wound no erythema. He does have a divot in the middle of the wound going towards this appearing tunneling area vertically. This is a bit difficult to understand however there is no palpable bone Electronic Signature(s) Signed: 04/19/2018 6:18:32 PM By: Baltazar Najjar MD Entered By: Baltazar Najjar on 04/19/2018 11:04:11 Mitchell Dixon ([id number]) -------------------------------------------------------------------------------- Physician Orders Details Patient Name: Mitchell Dixon Date of Service: 04/19/2018 9:45 AM Medical Record Number: [id number] Patient Account Number: [id number] Date of Birth/Sex: 1956-01-15 (63 y.o. M) Treating RN: Arnette Norris Primary Care Provider: Karie Fetch Other Clinician: Referring Provider: Karie Fetch Treating Provider/Extender: Altamese [place] in Treatment: 3 Verbal / Phone Orders: No Diagnosis Coding Wound Cleansing Wound #1 Midline Sacrum o Clean wound with Normal Saline. Anesthetic (add to Medication List) Wound #1 Midline Sacrum o Topical Lidocaine 4% cream applied to wound bed prior to debridement (In Clinic Only). Primary Wound Dressing Wound #1 Midline Sacrum o Silver Alginate Secondary Dressing Wound #1 Midline Sacrum o Boardered Foam Dressing Dressing Change Frequency Wound #1 Midline Sacrum o Change Dressing Monday, Wednesday, Friday Follow-up Appointments Wound #1 Midline Sacrum o Return Appointment in 1 week. Off-Loading Wound #1 Midline Sacrum o Mattress o Turn and reposition  every 2 hours Home Health Wound #1 Midline Sacrum o Continue Home Health Visits - [facility] o Home Health Nurse may visit PRN to address patientos wound care needs. o FACE TO FACE ENCOUNTER: MEDICARE and MEDICAID PATIENTS: I certify that this patient is under my care and that I had a face-to-face encounter that meets the physician face-to-face encounter requirements with this patient on this date. The encounter with the patient was in whole or in part for the following MEDICAL CONDITION: (primary reason for Home Healthcare) MEDICAL NECESSITY: I certify, that based on my findings, NURSING services are a medically necessary home health service. HOME BOUND STATUS: I certify that my clinical findings support that this patient is homebound (i.e., Due to illness or injury, pt requires aid of supportive devices such as crutches, cane, wheelchairs, walkers, the use of special transportation or the assistance of another person to leave their place of residence. There is a normal inability to leave the home RALPHEAL, ZAPPONE. ([id number]) and doing so requires considerable and taxing effort. Other absences are for medical reasons / religious services and are infrequent or of short duration when for other reasons). o If current dressing causes regression in wound condition, may D/C ordered dressing product/s and apply Normal Saline Moist Dressing daily until next Wound Healing Center / Other MD appointment. Notify Wound Healing Center of regression in wound condition at [phone]. o Please direct any NON-WOUND related issues/requests for orders to patient's Primary Care Physician Electronic Signature(s) Signed: 04/19/2018 6:18:32 PM By: Baltazar Najjar MD Signed: 04/25/2018 11:13:25 AM By: Arnette Norris Entered By: Arnette Norris on 04/19/2018 10:42:05 Mitchell Dixon ([id number]) -------------------------------------------------------------------------------- Problem List  Details Patient Name: Mitchell Dixon Date of Service: 04/19/2018 9:45 AM Medical Record Number: [id number] Patient Account Number: [id number] Date of Birth/Sex: [birth year] (63 y.o. M) Treating RN: Huel Coventry  Primary Care Provider: Karie Fetch Other Clinician: Referring Provider: Karie Fetch Treating Provider/Extender: Altamese [place] in Treatment: 3 Active Problems ICD-10 Evaluated Encounter Code Description Active Date Today Diagnosis L89.154 Pressure ulcer of sacral region, stage 4 03/29/2018 No Yes G82.21 Paraplegia, complete 03/29/2018 No Yes Inactive Problems Resolved Problems ICD-10 Code Description Active Date Resolved Date L89.312 Pressure ulcer of right buttock, stage 2 03/29/2018 03/29/2018 Electronic Signature(s) Signed: 04/19/2018 6:18:32 PM By: Baltazar Najjar MD Entered By: Baltazar Najjar on 04/19/2018 10:57:41 Starnes, [name] ([id number]) -------------------------------------------------------------------------------- Progress Note Details Patient Name: Mitchell Dixon Date of Service: 04/19/2018 9:45 AM Medical Record Number: [id number] Patient Account Number: [id number] Date of Birth/Sex: Apr 27, 1955 (62 y.o. 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 He has been followed at the [facility] wound care clinic/podiatry for wounds on his bilateral feet. He has home health. We did not look at these today. Over the last 3 weeks they noticed a small area on his sacrum which has matured into a fairly substantial pressure ulcer. He is here for our review of this. 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 He also required an embolectomy and fasciectomy of the right leg and he currently has wounds on the right heel and left ankle but as stated these are being followed at [facility]. 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 X-ray we did last week did not show evidence of acute osteomyelitis in the sacrum. The patient has a pacemaker and will need a CT scan of this area to exclude osteomyelitis. Swab culture I did showed a few Enterococcus faecalis and a few enterococcus avium I started him on Augmentin 875 twice daily for 10 days on 2/3 2/12; patient is still on Augmentin but he should finish up before I see him next week. We have had some trouble with Medicaid and CT scan authorization. The wound is probably actually on S4-S5 so we will ask for a CT scan of this area rather than the in the pelvis. We have been using silver alginate as the primary dressing in the wound is cleaning up quite nicely 2/19; 2/19; the patient has completed antibiotics. His CT scan is booked for 2/25; we have been using silver alginate. Objective Constitutional Sitting or standing Blood Pressure is within target range for patient.. Pulse regular and within target range for patient.Marland Kitchen Respirations regular, non-labored and within target range.. Temperature is normal and within the target range for the patient.. Vitals Time Taken: 9:58 AM, Height: 70 in, Weight: 140 lbs, BMI: 20.1, Temperature: 97.5 F, Pulse: 62 bpm, Respiratory Rate: 18 breaths/min, Blood Pressure: 131/82  mmHg. Eyes Conjunctivae clear. No discharge. THIJS, KOOL ([id number]) Respiratory Respiratory effort is easy and symmetric bilaterally. Rate is normal at rest and  on room air.. Bilateral breath sounds are clear and equal in all lobes with no wheezes, rales or rhonchi.. Cardiovascular Heart rhythm and rate regular, without murmur or gallop.. Gastrointestinal (GI) Abdomen is soft and non-distended without masses or tenderness. Bowel sounds active in all quadrants.. No liver or spleen enlargement or tenderness.Marland Kitchen. Psychiatric No evidence of depression, anxiety, or agitation. Calm, cooperative, and communicative. Appropriate interactions and affect.. General Notes: Wound exam; the area in question is on the lower sacrum. Dimensions of the wound perhaps slightly larger areas 2.5 cm of superior tunneling at 12:00 versus 1.5 last time. There is no purulent drainage no evidence of soft tissue infection around the wound no erythema. He does have a divot in the middle of the wound going towards this appearing tunneling area vertically. This is a bit difficult to understand however there is no palpable bone Integumentary (Hair, Skin) Wound #1 status is Open. Original cause of wound was Gradually Appeared. The wound is located on the Midline Sacrum. The wound measures 2cm length x 2.4cm width x 0.8cm depth; 3.77cm^2 area and 3.016cm^3 volume. There is muscle and Fat Layer (Subcutaneous Tissue) Exposed exposed. There is no undermining noted, however, there is tunneling at 12:00 with a maximum distance of 2.2cm. There is a large amount of serosanguineous drainage noted. Foul odor after cleansing was noted. The wound margin is flat and intact. There is no granulation within the wound bed. There is a medium (34-66%) amount of necrotic tissue within the wound bed including Eschar and Adherent Slough. The periwound skin appearance did not exhibit: Callus, Crepitus, Excoriation, Induration, Rash,  Scarring, Dry/Scaly, Maceration, Atrophie Blanche, Cyanosis, Ecchymosis, Hemosiderin Staining, Mottled, Pallor, Rubor, Erythema. Periwound temperature was noted as No Abnormality. Assessment Active Problems ICD-10 Pressure ulcer of sacral region, stage 4 Paraplegia, complete Plan Wound Cleansing: Wound #1 Midline Sacrum: Clean wound with Normal Saline. Anesthetic (add to Medication List): Wound #1 Midline Sacrum: Topical Lidocaine 4% cream applied to wound bed prior to debridement (In Clinic Only). Primary Wound Dressing: Wound #1 Midline Sacrum: Silver Alginate Mitchell GearTERSON, Kert N. ([id number]) Secondary Dressing: Wound #1 Midline Sacrum: Boardered Foam Dressing Dressing Change Frequency: Wound #1 Midline Sacrum: Change Dressing Monday, Wednesday, Friday Follow-up Appointments: Wound #1 Midline Sacrum: Return Appointment in 1 week. Off-Loading: Wound #1 Midline Sacrum: Mattress Turn and reposition every 2 hours Home Health: Wound #1 Midline Sacrum: [facility] Home Health Nurse may visit PRN to address patient s wound care needs. FACE TO FACE ENCOUNTER: MEDICARE and MEDICAID PATIENTS: I certify that this patient is under my care and that I had a face-to-face encounter that meets the physician face-to-face encounter requirements with this patient on this date. The encounter with the patient was in whole or in part for the following MEDICAL CONDITION: (primary reason for Home Healthcare) MEDICAL NECESSITY: I certify, that based on my findings, NURSING services are a medically necessary home health service. HOME BOUND STATUS: I certify that my clinical findings support that this patient is homebound (i.e., Due to illness or injury, pt requires aid of supportive devices such as crutches, cane, wheelchairs, walkers, the use of special transportation or the assistance of another person to leave their place of residence. There is a normal  inability to leave the home and doing so requires considerable and taxing effort. Other absences are for medical reasons / religious services and are infrequent or of short duration when for other reasons). If current dressing causes regression in wound condition, may  D/C ordered dressing product/s and apply Normal Saline Moist Dressing daily until next Wound Healing Center / Other MD appointment. Notify Wound Healing Center of regression in wound condition at [phone]. Please direct any NON-WOUND related issues/requests for orders to patient's Primary Care Physician 1. I am continuing with silver alginate as the primary dressing 2. If the CT scan is negative I would like to apply a wound VAC to this area 3. No additional antibiotics for now. 4. He is apparently making all efforts to offload this area which is going to be important if were going to get this to heal Electronic Signature(s) Signed: 04/19/2018 6:18:32 PM By: Baltazar Najjar MD Entered By: Baltazar Najjar on 04/19/2018 11:04:59 Mitchell Dixon ([id number]) -------------------------------------------------------------------------------- SuperBill Details Patient Name: Mitchell Dixon Date of Service: 04/19/2018 Medical Record Number: [id number] Patient Account Number: [id number] Date of Birth/Sex: [birth year] (63 y.o. M) Treating RN: Huel Coventry Primary Care Provider: Karie Fetch Other Clinician: Referring Provider: Karie Fetch Treating Provider/Extender: Altamese [place] in Treatment: 3 Diagnosis Coding ICD-10 Codes Code Description L89.154 Pressure ulcer of sacral region, stage 4 G82.21 Paraplegia, complete Facility Procedures CPT4 Code: [id number] Description: 99213 - WOUND CARE VISIT-LEV 3 EST PT Modifier: Quantity: 1 Physician Procedures CPT4 Code: [id number] Description: 99213 - WC PHYS LEVEL 3 - EST PT ICD-10 Diagnosis Description L89.154 Pressure ulcer of sacral region, stage 4 G82.21 Paraplegia,  complete Modifier: Quantity: 1 Electronic Signature(s) Signed: 04/19/2018 6:18:32 PM By: Baltazar Najjar MD Entered By: Baltazar Najjar on 04/19/2018 11:05:25

## 2018-04-25 NOTE — Progress Notes (Signed)
BOE, GOFFREDO ([id number]) Visit Report for 04/19/2018 Arrival Information Details Patient Name: Mitchell Dixon, Mitchell Dixon Date of Service: 04/19/2018 9:45 AM Medical Record Number: [id number] Patient Account Number: [id number] Date of Birth/Sex: 15-May-1955 (63 y.o. M) Treating RN: Rema Jasmine Primary Care Nashla Althoff: Karie Fetch Other Clinician: Referring Damari Hiltz: Karie Fetch Treating Destani Wamser/Extender: Altamese [place] in Treatment: 3 Visit Information History Since Last Visit Added or deleted any medications: No Patient Arrived: Wheel Chair Any new allergies or adverse reactions: No Arrival Time: 09:56 Had a fall or experienced change in No Accompanied By: brother activities of daily living that may affect Transfer Assistance: Other risk of falls: Patient Identification Verified: Yes Signs or symptoms of abuse/neglect since last visito No Secondary Verification Process Completed: Yes Hospitalized since last visit: No Implantable device outside of the clinic excluding No cellular tissue based products placed in the center since last visit: Has Dressing in Place as Prescribed: Yes Pain Present Now: No Electronic Signature(s) Signed: 04/24/2018 4:13:49 PM By: Rema Jasmine Entered By: Rema Jasmine on 04/19/2018 09:57:45 Bresee, [name] ([id number]) -------------------------------------------------------------------------------- Clinic Level of Care Assessment Details Patient Name: Mitchell Dixon Date of Service: 04/19/2018 9:45 AM Medical Record Number: [id number] Patient Account Number: [id number] Date of Birth/Sex: 06-Aug-1955 (62 y.o. M) Treating RN: Arnette Norris Primary Care Texie Tupou: Karie Fetch Other Clinician: Referring Ollie Delano: Karie Fetch Treating Lunden Stieber/Extender: Altamese [place] in Treatment: 3 Clinic Level of Care Assessment Items TOOL 4 Quantity Score []  - Use when only an EandM is performed on FOLLOW-UP visit 0 ASSESSMENTS - Nursing  Assessment / Reassessment X - Reassessment of Co-morbidities (includes updates in patient status) 1 10 X- 1 5 Reassessment of Adherence to Treatment Plan ASSESSMENTS - Wound and Skin Assessment / Reassessment X - Simple Wound Assessment / Reassessment - one wound 1 5 []  - 0 Complex Wound Assessment / Reassessment - multiple wounds []  - 0 Dermatologic / Skin Assessment (not related to wound area) ASSESSMENTS - Focused Assessment []  - Circumferential Edema Measurements - multi extremities 0 []  - 0 Nutritional Assessment / Counseling / Intervention []  - 0 Lower Extremity Assessment (monofilament, tuning fork, pulses) []  - 0 Peripheral Arterial Disease Assessment (using hand held doppler) ASSESSMENTS - Ostomy and/or Continence Assessment and Care []  - Incontinence Assessment and Management 0 []  - 0 Ostomy Care Assessment and Management (repouching, etc.) PROCESS - Coordination of Care X - Simple Patient / Family Education for ongoing care 1 15 []  - 0 Complex (extensive) Patient / Family Education for ongoing care X- 1 10 Staff obtains Chiropractor, Records, Test Results / Process Orders []  - 0 Staff telephones HHA, Nursing Homes / Clarify orders / etc []  - 0 Routine Transfer to another Facility (non-emergent condition) []  - 0 Routine Hospital Admission (non-emergent condition) []  - 0 New Admissions / Manufacturing engineer / Ordering NPWT, Apligraf, etc. []  - 0 Emergency Hospital Admission (emergent condition) X- 1 10 Simple Discharge Coordination SAYID, MORENZ ([id number]) []  - 0 Complex (extensive) Discharge Coordination PROCESS - Special Needs []  - Pediatric / Minor Patient Management 0 []  - 0 Isolation Patient Management []  - 0 Hearing / Language / Visual special needs []  - 0 Assessment of Community assistance (transportation, D/C planning, etc.) []  - 0 Additional assistance / Altered mentation []  - 0 Support Surface(s) Assessment (bed, cushion, seat,  etc.) INTERVENTIONS - Wound Cleansing / Measurement X - Simple Wound Cleansing - one wound 1 5 []  - 0 Complex Wound Cleansing - multiple wounds X- 1 5 Wound  Imaging (photographs - any number of wounds) []  - 0 Wound Tracing (instead of photographs) X- 1 5 Simple Wound Measurement - one wound []  - 0 Complex Wound Measurement - multiple wounds INTERVENTIONS - Wound Dressings X - Small Wound Dressing one or multiple wounds 1 10 []  - 0 Medium Wound Dressing one or multiple wounds []  - 0 Large Wound Dressing one or multiple wounds []  - 0 Application of Medications - topical []  - 0 Application of Medications - injection INTERVENTIONS - Miscellaneous []  - External ear exam 0 []  - 0 Specimen Collection (cultures, biopsies, blood, body fluids, etc.) []  - 0 Specimen(s) / Culture(s) sent or taken to Lab for analysis []  - 0 Patient Transfer (multiple staff / Nurse, adult / Similar devices) []  - 0 Simple Staple / Suture removal (25 or less) []  - 0 Complex Staple / Suture removal (26 or more) []  - 0 Hypo / Hyperglycemic Management (close monitor of Blood Glucose) []  - 0 Ankle / Brachial Index (ABI) - do not check if billed separately X- 1 5 Vital Signs Cieslak, Arash N. ([id number]) Has the patient been seen at the hospital within the last three years: Yes Total Score: 85 Level Of Care: New/Established - Level 3 Electronic Signature(s) Signed: 04/25/2018 11:13:25 AM By: Arnette Norris Entered By: Arnette Norris on 04/19/2018 10:40:54 Mitchell Dixon ([id number]) -------------------------------------------------------------------------------- Lower Extremity Assessment Details Patient Name: Mitchell Dixon Date of Service: 04/19/2018 9:45 AM Medical Record Number: [id number] Patient Account Number: [id number] Date of Birth/Sex: 1955-07-28 (62 y.o. M) Treating RN: Rema Jasmine Primary Care Salihah Peckham: Karie Fetch Other Clinician: Referring Zimir Kittleson: Karie Fetch Treating  Dereke Neumann/Extender: Altamese [place] in Treatment: 3 Electronic Signature(s) Signed: 04/24/2018 4:13:49 PM By: Rema Jasmine Entered By: Rema Jasmine on 04/19/2018 09:59:58 Devos, [name] ([id number]) -------------------------------------------------------------------------------- Multi Wound Chart Details Patient Name: Mitchell Dixon Date of Service: 04/19/2018 9:45 AM Medical Record Number: [id number] Patient Account Number: [id number] Date of Birth/Sex: 1955-11-08 (62 y.o. M) Treating RN: Arnette Norris Primary Care Betzabe Bevans: Karie Fetch Other Clinician: Referring Jailine Lieder: Karie Fetch Treating Shannell Mikkelsen/Extender: Altamese [place] in Treatment: 3 Vital Signs Height(in): 70 Pulse(bpm): 62 Weight(lbs): 140 Blood Pressure(mmHg): 131/82 Body Mass Index(BMI): 20 Temperature(F): 97.5 Respiratory Rate 18 (breaths/min): Photos: [N/A:N/A] Wound Location: Sacrum - Midline N/A N/A Wounding Event: Gradually Appeared N/A N/A Primary Etiology: Pressure Ulcer N/A N/A Comorbid History: Arrhythmia, Deep Vein N/A N/A Thrombosis, Hypertension, History of pressure wounds, Paraplegia Date Acquired: 03/08/2018 N/A N/A Weeks of Treatment: 3 N/A N/A Wound Status: Open N/A N/A Measurements L x W x D 2x2.4x0.8 N/A N/A (cm) Area (cm) : 3.77 N/A N/A Volume (cm) : 3.016 N/A N/A % Reduction in Area: 51.50% N/A N/A % Reduction in Volume: 64.70% N/A N/A Position 1 (o'clock): 12 Maximum Distance 1 (cm): 2.2 Tunneling: Yes N/A N/A Classification: Category/Stage IV N/A N/A Exudate Amount: Large N/A N/A Exudate Type: Serosanguineous N/A N/A Exudate Color: red, brown N/A N/A Foul Odor After Cleansing: Yes N/A N/A Odor Anticipated Due to No N/A N/A Product Use: Wound Margin: Flat and Intact N/A N/A Granulation Amount: None Present (0%) N/A N/A Necrotic Amount: Medium (34-66%) N/A N/A DEMECIO, GILES ([id number]) Necrotic Tissue: Eschar, Adherent Slough N/A N/A Exposed  Structures: Fat Layer (Subcutaneous N/A N/A Tissue) Exposed: Yes Muscle: Yes Fascia: No Tendon: No Joint: No Bone: No Epithelialization: None N/A N/A Periwound Skin Texture: Excoriation: No N/A N/A Induration: No Callus: No Crepitus: No Rash: No Scarring: No Periwound Skin Moisture: Maceration: No N/A  N/A Dry/Scaly: No Periwound Skin Color: Atrophie Blanche: No N/A N/A Cyanosis: No Ecchymosis: No Erythema: No Hemosiderin Staining: No Mottled: No Pallor: No Rubor: No Temperature: No Abnormality N/A N/A Tenderness on Palpation: No N/A N/A Wound Preparation: Ulcer Cleansing: N/A N/A Rinsed/Irrigated with Saline Topical Anesthetic Applied: Other: lidocaine 4% Treatment Notes Electronic Signature(s) Signed: 04/19/2018 6:18:32 PM By: Baltazar Najjar MD Entered By: Baltazar Najjar on 04/19/2018 10:57:50 Mitchell Dixon ([id number]) -------------------------------------------------------------------------------- Multi-Disciplinary Care Plan Details Patient Name: Mitchell Dixon Date of Service: 04/19/2018 9:45 AM Medical Record Number: [id number] Patient Account Number: [id number] Date of Birth/Sex: May 30, 1955 (63 y.o. M) Treating RN: Arnette Norris Primary Care Fadi Menter: Karie Fetch Other Clinician: Referring Abriana Saltos: Karie Fetch Treating Jenafer [place]/Extender: Altamese [place] in Treatment: 3 Active Inactive Necrotic Tissue Nursing Diagnoses: Impaired tissue integrity related to necrotic/devitalized tissue Goals: Necrotic/devitalized tissue will be minimized in the wound bed Date Initiated: 03/29/2018 Target Resolution Date: 03/29/2018 Goal Status: Active Interventions: Assess patient pain level pre-, during and post procedure and prior to discharge Treatment Activities: Apply topical anesthetic as ordered : 03/29/2018 Excisional debridement : 03/29/2018 Notes: Orientation to the Wound Care Program Nursing Diagnoses: Knowledge deficit related to  the wound healing center program Goals: Patient/caregiver will verbalize understanding of the Wound Healing Center Program Date Initiated: 03/29/2018 Target Resolution Date: 03/29/2018 Goal Status: Active Interventions: Provide education on orientation to the wound center Notes: Pressure Nursing Diagnoses: Knowledge deficit related to management of pressures ulcers Potential for impaired tissue integrity related to pressure, friction, moisture, and shear Goals: Patient/caregiver will verbalize understanding of pressure ulcer management KHAN, BRAYE ([id number]) Date Initiated: 03/29/2018 Target Resolution Date: 03/29/2018 Goal Status: Active Interventions: Assess: immobility, friction, shearing, incontinence upon admission and as needed Provide education on pressure ulcers Treatment Activities: Patient referred for home evaluation of offloading devices/mattresses : 03/29/2018 Notes: Soft Tissue Infection Nursing Diagnoses: Impaired tissue integrity Potential for infection: soft tissue Goals: Patient will remain free of wound infection Date Initiated: 03/29/2018 Target Resolution Date: 03/29/2018 Goal Status: Active Signs and symptoms of infection will be recognized early to allow for prompt treatment Date Initiated: 03/29/2018 Target Resolution Date: 03/29/2018 Goal Status: Active Interventions: Assess signs and symptoms of infection every visit Treatment Activities: Culture and sensitivity : 03/29/2018 Notes: Wound/Skin Impairment Nursing Diagnoses: Impaired tissue integrity Goals: Patient/caregiver will verbalize understanding of skin care regimen Date Initiated: 03/29/2018 Target Resolution Date: 03/29/2018 Goal Status: Active Ulcer/skin breakdown will have a volume reduction of 30% by week 4 Date Initiated: 03/29/2018 Target Resolution Date: 03/29/2018 Goal Status: Active Interventions: Assess ulceration(s) every visit Treatment Activities: Patient referred to  home care : 03/29/2018 Skin care regimen initiated : 03/29/2018 TYRIAN, INFANTE ([id number]) Notes: Electronic Signature(s) Signed: 04/25/2018 11:13:25 AM By: Arnette Norris Entered By: Arnette Norris on 04/19/2018 10:39:43 Mitchell Dixon ([id number]) -------------------------------------------------------------------------------- Pain Assessment Details Patient Name: Mitchell Dixon Date of Service: 04/19/2018 9:45 AM Medical Record Number: [id number] Patient Account Number: [id number] Date of Birth/Sex: [birth year] (63 y.o. M) Treating RN: Rema Jasmine Primary Care Jemaine Prokop: Karie Fetch Other Clinician: Referring Kiandria Clum: Karie Fetch Treating Renai Lopata/Extender: Altamese [place] in Treatment: 3 Active Problems Location of Pain Severity and Description of Pain Patient Has Paino No Site Locations Pain Management and Medication Current Pain Management: Notes pt denies any pain at this time. Electronic Signature(s) Signed: 04/24/2018 4:13:49 PM By: Rema Jasmine Entered By: Rema Jasmine on 04/19/2018 09:58:07 Mitchell Dixon ([id number]) -------------------------------------------------------------------------------- Patient/Caregiver Education Details Patient Name: Mitchell Dixon Date of Service: 04/19/2018 9:45  AM Medical Record Number: [id number] Patient Account Number: [id number] Date of Birth/Gender: 24-Nov-1955 (63 y.o. M) Treating RN: Arnette Norris Primary Care Physician: Karie Fetch Other Clinician: Referring Physician: Karie Fetch Treating Physician/Extender: Altamese [place] in Treatment: 3 Education Assessment Education Provided To: Patient Education Topics Provided Wound/Skin Impairment: Handouts: Caring for Your Ulcer Methods: Demonstration Responses: State content correctly Electronic Signature(s) Signed: 04/25/2018 11:13:25 AM By: Arnette Norris Entered By: Arnette Norris on 04/19/2018 10:41:12 Mitchell Dixon  ([id number]) -------------------------------------------------------------------------------- Wound Assessment Details Patient Name: Mitchell Dixon Date of Service: 04/19/2018 9:45 AM Medical Record Number: [id number] Patient Account Number: [id number] Date of Birth/Sex: [birth year] (63 y.o. M) Treating RN: Rema Jasmine Primary Care Viyan Rosamond: Karie Fetch Other Clinician: Referring Christopherjame Carnell: Karie Fetch Treating Vanderbilt Ranieri/Extender: Altamese [place] in Treatment: 3 Wound Status Wound Number: 1 Primary Pressure Ulcer Etiology: Wound Location: Sacrum - Midline Wound Open Wounding Event: Gradually Appeared Status: Date Acquired: 03/08/2018 Comorbid Arrhythmia, Deep Vein Thrombosis, Weeks Of Treatment: 3 History: Hypertension, History of pressure wounds, Clustered Wound: No Paraplegia Photos Photo Uploaded By: Rema Jasmine on 04/19/2018 10:28:58 Wound Measurements Length: (cm) 2 % Reduction Width: (cm) 2.4 % Reduction Depth: (cm) 0.8 Epitheliali Area: (cm) 3.77 Tunneling: Volume: (cm) 3.016 Positio Maximum in Area: 51.5% in Volume: 64.7% zation: None Yes n (o'clock): 12 Distance: (cm) 2.2 Undermining: No Wound Description Classification: Category/Stage IV Foul Odor A Wound Margin: Flat and Intact Due to Prod Exudate Amount: Large Slough/Fibr Exudate Type: Serosanguineous Exudate Color: red, brown fter Cleansing: Yes uct Use: No ino Yes Wound Bed Granulation Amount: None Present (0%) Exposed Structure Necrotic Amount: Medium (34-66%) Fascia Exposed: No Necrotic Quality: Eschar, Adherent Slough Fat Layer (Subcutaneous Tissue) Exposed: Yes Tendon Exposed: No Muscle Exposed: Yes Necrosis of Muscle: No KINSEY, COWSERT ([id number]) Joint Exposed: No Bone Exposed: No Periwound Skin Texture Texture Color No Abnormalities Noted: No No Abnormalities Noted: No Callus: No Atrophie Blanche: No Crepitus: No Cyanosis: No Excoriation: No Ecchymosis:  No Induration: No Erythema: No Rash: No Hemosiderin Staining: No Scarring: No Mottled: No Pallor: No Moisture Rubor: No No Abnormalities Noted: No Dry / Scaly: No Temperature / Pain Maceration: No Temperature: No Abnormality Wound Preparation Ulcer Cleansing: Rinsed/Irrigated with Saline Topical Anesthetic Applied: Other: lidocaine 4%, Electronic Signature(s) Signed: 04/24/2018 4:13:49 PM By: Rema Jasmine Entered By: Rema Jasmine on 04/19/2018 10:07:39 Mitchell Dixon ([id number]) -------------------------------------------------------------------------------- Vitals Details Patient Name: Mitchell Dixon Date of Service: 04/19/2018 9:45 AM Medical Record Number: [id number] Patient Account Number: [id number] Date of Birth/Sex: [birth year] (62 y.o. M) Treating RN: Rema Jasmine Primary Care Nyema Hachey: Karie Fetch Other Clinician: Referring Ethanjames Fontenot: Karie Fetch Treating Yarelli Decelles/Extender: Altamese [place] in Treatment: 3 Vital Signs Time Taken: 09:58 Temperature (F): 97.5 Height (in): 70 Pulse (bpm): 62 Weight (lbs): 140 Respiratory Rate (breaths/min): 18 Body Mass Index (BMI): 20.1 Blood Pressure (mmHg): 131/82 Reference Range: 80 - 120 mg / dl Electronic Signature(s) Signed: 04/24/2018 4:13:49 PM By: Rema Jasmine Entered ByRema Jasmine on 04/19/2018 09:58:36

## 2018-04-26 ENCOUNTER — Other Ambulatory Visit
Admission: RE | Admit: 2018-04-26 | Discharge: 2018-04-26 | Disposition: A | Discharge status: Home / Self Care | Payer: Medicaid Other | Source: Ambulatory Visit | Attending: Internal Medicine | Admitting: Internal Medicine

## 2018-04-26 ENCOUNTER — Encounter: Payer: Medicaid Other | Admitting: Internal Medicine

## 2018-04-26 DIAGNOSIS — L089 Local infection of the skin and subcutaneous tissue, unspecified: Secondary | ICD-10-CM | POA: Insufficient documentation

## 2018-04-26 DIAGNOSIS — L89154 Pressure ulcer of sacral region, stage 4: Secondary | ICD-10-CM | POA: Diagnosis not present

## 2018-04-27 NOTE — Progress Notes (Signed)
CAUY, BUSSELL ([id number]) Visit Report for 04/26/2018 HPI Details Patient Name: Mitchell Dixon, Mitchell Dixon Date of Service: 04/26/2018 1:45 PM Medical Record Number: [id number] Patient Account Number: [id number] Date of Birth/Sex: [birth year] (63 y.o. 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 He has been followed at the [facility] wound care clinic/podiatry for wounds on his bilateral feet. He has home health. We did not look at these today. Over the last 3 weeks they noticed a small area on his sacrum which has matured into a fairly substantial pressure ulcer. He is here for our review of this. 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 He also required an embolectomy and fasciectomy of the right leg and he currently has wounds on the right heel and left ankle but as stated these are being followed at [facility]. 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 X-ray we did last week did not show evidence of acute osteomyelitis in the sacrum. The patient has a pacemaker and will need a CT scan of this area to exclude osteomyelitis. Swab culture I did showed a few Enterococcus faecalis and a few enterococcus avium I  started him on Augmentin 875 twice daily for 10 days on 2/3 2/12; patient is still on Augmentin but he should finish up before I see him next week. We have had some trouble with Medicaid and CT scan authorization. The wound is probably actually on S4-S5 so we will ask for a CT scan of this area rather than the in the pelvis. We have been using silver alginate as the primary dressing in the wound is cleaning up quite nicely 2/19; 2/19; the patient has completed antibiotics. His CT scan is booked for 2/25; we have been using silver alginate. 2/26; the patient had a CT scan done. Unfortunately they did not even comment on the area of the lower sacrum that we wanted them to look at. However they discovered an incidentally very large complex mass anterior to the left hip and left femur measuring 13.4 x 7.3 x 5.1. This had the appearance of a possible bone or soft tissue malignancy. They recommended an MRI but he has a pacemaker and I think that would preclude that. I have sent him for review by orthopedics. I suspect they will need to biopsy. Wound is about the same size. Some serosanguineous drainage we have been using silver alginate Electronic Signature(s) Signed: 04/26/2018 4:42:30 PM By: Baltazar Najjar MD Entered By: Baltazar Najjar on 04/26/2018 15:15:36 Mitchell Dixon ([id number]) -------------------------------------------------------------------------------- Physical Exam Details Patient Name: Mitchell Dixon Date of Service: 04/26/2018 1:45 PM Medical Record Number: [id number] Patient Account Number: [id number] Date of Birth/Sex: 1956-01-03 (62 y.o. M) Treating RN: Huel Coventry Primary Care Provider: Karie Fetch Other Clinician: Referring Provider: Letta Pate  NGWE Treating Provider/Extender: Maxwell Caul [place] in Treatment: 4 Constitutional Sitting or standing Blood Pressure is within target range for patient.. Pulse regular and within target range for  patient.Marland Kitchen Respirations regular, non-labored and within target range.. Temperature is normal and within the target range for the patient.Marland Kitchen appears in no distress. Eyes Conjunctivae clear. No discharge. Respiratory Respiratory effort is easy and symmetric bilaterally. Rate is normal at rest and on room air.. Gastrointestinal (GI) No liver or spleen enlargement or tenderness.. Musculoskeletal There does appear to be an enlargement around the proximal left femur/hip area. This is firm and of bony consistency.Marland Kitchen Psychiatric No evidence of depression, anxiety, or agitation. Calm, cooperative, and communicative. Appropriate interactions and affect.. Notes Wound exam; the area in question is in the lower sacrum. Dimensions of the wound are about the same still tunneling at 12:00. Some serosanguineous drainage and some odor I have therefore went ahead and did a culture. No empiric antibiotics. The soft tissue around the wound does not look too ominous. No crepitus Electronic Signature(s) Signed: 04/26/2018 4:42:30 PM By: Baltazar Najjar MD Entered By: Baltazar Najjar on 04/26/2018 15:19:32 Mitchell Dixon ([id number]) -------------------------------------------------------------------------------- Physician Orders Details Patient Name: Mitchell Dixon Date of Service: 04/26/2018 1:45 PM Medical Record Number: [id number] Patient Account Number: [id number] Date of Birth/Sex: 28-Apr-1955 (63 y.o. M) Treating RN: Huel Coventry Primary Care Provider: Karie Fetch Other Clinician: Referring Provider: Karie Fetch Treating Provider/Extender: Altamese [place] in Treatment: 4 Verbal / Phone Orders: No Diagnosis Coding Wound Cleansing Wound #1 Midline Sacrum o Clean wound with Normal Saline. Anesthetic (add to Medication List) Wound #1 Midline Sacrum o Topical Lidocaine 4% cream applied to wound bed prior to debridement (In Clinic Only). Primary Wound Dressing Wound #1 Midline  Sacrum o Silver Alginate Secondary Dressing Wound #1 Midline Sacrum o Boardered Foam Dressing Dressing Change Frequency Wound #1 Midline Sacrum o Change Dressing Monday, Wednesday, Friday Follow-up Appointments Wound #1 Midline Sacrum o Return Appointment in 1 week. Off-Loading Wound #1 Midline Sacrum o Mattress o Turn and reposition every 2 hours Home Health Wound #1 Midline Sacrum o Continue Home Health Visits - [facility] o Home Health Nurse may visit PRN to address patientos wound care needs. o FACE TO FACE ENCOUNTER: MEDICARE and MEDICAID PATIENTS: I certify that this patient is under my care and that I had a face-to-face encounter that meets the physician face-to-face encounter requirements with this patient on this date. The encounter with the patient was in whole or in part for the following MEDICAL CONDITION: (primary reason for Home Healthcare) MEDICAL NECESSITY: I certify, that based on my findings, NURSING services are a medically necessary home health service. HOME BOUND STATUS: I certify that my clinical findings support that this patient is homebound (i.e., Due to illness or injury, pt requires aid of supportive devices such as crutches, cane, wheelchairs, walkers, the use of special transportation or the assistance of another person to leave their place of residence. There is a normal inability to leave the home LEAMAN, DIMEO. ([id number]) and doing so requires considerable and taxing effort. Other absences are for medical reasons / religious services and are infrequent or of short duration when for other reasons). o If current dressing causes regression in wound condition, may D/C ordered dressing product/s and apply Normal Saline Moist Dressing daily until next Wound Healing Center / Other MD appointment. Notify Wound Healing Center of regression in wound condition at [phone]. o Please direct any NON-WOUND related  issues/requests for  orders to patient's Primary Care Physician Consults o General Surgery - Ortho Laboratory o Bacteria identified in Wound by Culture (MICRO) - Right gluteus oooo LOINC Code: 6462-6 oooo Convenience Name: Wound culture routine Electronic Signature(s) Signed: 04/26/2018 4:42:30 PM By: Baltazar Najjar MD Signed: 04/26/2018 5:55:39 PM By: Elliot Gurney, BSN, RN, CWS, Kim RN, BSN Entered By: Elliot Gurney, BSN, RN, CWS, Kim on 04/26/2018 14:33:13 KALAB, FIDEL ([id number]) -------------------------------------------------------------------------------- Problem List Details Patient Name: Mitchell Dixon Date of Service: 04/26/2018 1:45 PM Medical Record Number: [id number] Patient Account Number: [id number] Date of Birth/Sex: December 14, 1955 (63 y.o. M) Treating RN: Huel Coventry Primary Care Provider: Karie Fetch Other Clinician: Referring Provider: Karie Fetch Treating Provider/Extender: Altamese [place] in Treatment: 4 Active Problems ICD-10 Evaluated Encounter Code Description Active Date Today Diagnosis L89.154 Pressure ulcer of sacral region, stage 4 03/29/2018 No Yes G82.21 Paraplegia, complete 03/29/2018 No Yes Inactive Problems Resolved Problems ICD-10 Code Description Active Date Resolved Date L89.312 Pressure ulcer of right buttock, stage 2 03/29/2018 03/29/2018 Electronic Signature(s) Signed: 04/26/2018 4:42:30 PM By: Baltazar Najjar MD Entered By: Baltazar Najjar on 04/26/2018 15:12:57 Mitzel, [name] ([id number]) -------------------------------------------------------------------------------- Progress Note Details Patient Name: Mitchell Dixon Date of Service: 04/26/2018 1:45 PM Medical Record Number: [id number] Patient Account Number: [id number] Date of Birth/Sex: 06-Aug-1955 (63 y.o. 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 He has been followed at the [facility] wound care clinic/podiatry for wounds on his bilateral feet. He has home health. We did not look at these today. Over the last 3 weeks they noticed a small area on his sacrum which has matured into a fairly substantial pressure ulcer. He is here for our review of this. 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 He also required an embolectomy and fasciectomy of the right leg and he currently has wounds on the right heel and left ankle but as stated these are being followed at [facility]. 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 X-ray we did last week did not show evidence of acute osteomyelitis in the sacrum. The patient has a pacemaker and will need a CT scan of this area to exclude osteomyelitis. Swab culture I did showed a few Enterococcus faecalis and a few enterococcus avium I started him on Augmentin 875 twice daily for 10 days on 2/3 2/12; patient is still on Augmentin but he should finish up before I see him next week. We have had some trouble with Medicaid and CT scan authorization. The wound is probably actually on S4-S5 so we will ask for a CT scan of this area rather than the in the pelvis. We have been using silver alginate as the primary dressing in the wound is cleaning up quite  nicely 2/19; 2/19; the patient has  completed antibiotics. His CT scan is booked for 2/25; we have been using silver alginate. 2/26; the patient had a CT scan done. Unfortunately they did not even comment on the area of the lower sacrum that we wanted them to look at. However they discovered an incidentally very large complex mass anterior to the left hip and left femur measuring 13.4 x 7.3 x 5.1. This had the appearance of a possible bone or soft tissue malignancy. They recommended an MRI but he has a pacemaker and I think that would preclude that. I have sent him for review by orthopedics. I suspect they will need to biopsy. Wound is about the same size. Some serosanguineous drainage we have been using silver alginate Objective Constitutional Sitting or standing Blood Pressure is within target range for patient.. Pulse regular and within target range for patient.Marland Kitchen Respirations regular, non-labored and within target range.. Temperature is normal and within the target range for the patient.Marland Kitchen KINNETH, FUJIWARA ([id number]) appears in no distress. Vitals Time Taken: 1:55 PM, Height: 70 in, Weight: 140 lbs, BMI: 20.1, Temperature: 97.5 F, Pulse: 66 bpm, Respiratory Rate: 16 breaths/min, Blood Pressure: 143/90 mmHg. Eyes Conjunctivae clear. No discharge. Respiratory Respiratory effort is easy and symmetric bilaterally. Rate is normal at rest and on room air.. Gastrointestinal (GI) No liver or spleen enlargement or tenderness.. Musculoskeletal There does appear to be an enlargement around the proximal left femur/hip area. This is firm and of bony consistency.Marland Kitchen Psychiatric No evidence of depression, anxiety, or agitation. Calm, cooperative, and communicative. Appropriate interactions and affect.. General Notes: Wound exam; the area in question is in the lower sacrum. Dimensions of the wound are about the same still tunneling at 12:00. Some serosanguineous drainage and some odor I have  therefore went ahead and did a culture. No empiric antibiotics. The soft tissue around the wound does not look too ominous. No crepitus Integumentary (Hair, Skin) Wound #1 status is Open. Original cause of wound was Gradually Appeared. The wound is located on the Midline Sacrum. The wound measures 2.1cm length x 2.2cm width x 1.5cm depth; 3.629cm^2 area and 5.443cm^3 volume. There is muscle and Fat Layer (Subcutaneous Tissue) Exposed exposed. Tunneling has been noted at 12:00 with a maximum distance of 2.8cm. Undermining begins at 11:00 and ends at 1:00 with a maximum distance of 2.8cm. There is a large amount of serosanguineous drainage noted. Foul odor after cleansing was noted. The wound margin is flat and intact. There is no granulation within the wound bed. There is a medium (34-66%) amount of necrotic tissue within the wound bed including Eschar and Adherent Slough. The periwound skin appearance did not exhibit: Callus, Crepitus, Excoriation, Induration, Rash, Scarring, Dry/Scaly, Maceration, Atrophie Blanche, Cyanosis, Ecchymosis, Hemosiderin Staining, Mottled, Pallor, Rubor, Erythema. Periwound temperature was noted as No Abnormality. Assessment Active Problems ICD-10 Pressure ulcer of sacral region, stage 4 Paraplegia, complete Plan Wound Cleansing: Wound #1 Midline Sacrum: Clean wound with Normal Saline. KAHLEN, MORAIS ([id number]) Anesthetic (add to Medication List): Wound #1 Midline Sacrum: Topical Lidocaine 4% cream applied to wound bed prior to debridement (In Clinic Only). Primary Wound Dressing: Wound #1 Midline Sacrum: Silver Alginate Secondary Dressing: Wound #1 Midline Sacrum: Boardered Foam Dressing Dressing Change Frequency: Wound #1 Midline Sacrum: Change Dressing Monday, Wednesday, Friday Follow-up Appointments: Wound #1 Midline Sacrum: Return Appointment in 1 week. Off-Loading: Wound #1 Midline Sacrum: Mattress Turn and reposition every 2  hours Home Health: Wound #1 Midline Sacrum: Continue Home Health Visits - [facility]  Nurse may visit PRN to address patient s wound care needs. FACE TO FACE ENCOUNTER: MEDICARE and MEDICAID PATIENTS: I certify that this patient is under my care and that I had a face-to-face encounter that meets the physician face-to-face encounter requirements with this patient on this date. The encounter with the patient was in whole or in part for the following MEDICAL CONDITION: (primary reason for Home Healthcare) MEDICAL NECESSITY: I certify, that based on my findings, NURSING services are a medically necessary home health service. HOME BOUND STATUS: I certify that my clinical findings support that this patient is homebound (i.e., Due to illness or injury, pt requires aid of supportive devices such as crutches, cane, wheelchairs, walkers, the use of special transportation or the assistance of another person to leave their place of residence. There is a normal inability to leave the home and doing so requires considerable and taxing effort. Other absences are for medical reasons / religious services and are infrequent or of short duration when for other reasons). If current dressing causes regression in wound condition, may D/C ordered dressing product/s and apply Normal Saline Moist Dressing daily until next Wound Healing Center / Other MD appointment. Notify Wound Healing Center of regression in wound condition at [phone]. Please direct any NON-WOUND related issues/requests for orders to patient's Primary Care Physician Laboratory ordered were: Wound culture routine - Right gluteus Consults ordered were: General Surgery - Ortho 1. I continued with the silver alginate 2. Culture done no empiric antibiotics 3. The CT scan that we ordered to look at the lower sacrum did not even comment on this area however it did comment on a incidentally discovered complex mass over the left  hip and left proximal femur as described. He has a pacemaker and therefore I do not think he is an easy candidate for an MRI [reason we did the CT scan in the first place]. In any case I am sending him to orthopedics for their own set of x-rayso Biopsy Electronic Signature(s) Signed: 04/26/2018 4:42:30 PM By: Baltazar Najjar MD Entered By: Baltazar Najjar on 04/26/2018 15:21:04 Mitchell Dixon ([id number]) -------------------------------------------------------------------------------- SuperBill Details Patient Name: Mitchell Dixon Date of Service: 04/26/2018 Medical Record Number: [id number] Patient Account Number: [id number] Date of Birth/Sex: 1955-05-18 (63 y.o. M) Treating RN: Huel Coventry Primary Care Provider: Karie Fetch Other Clinician: Referring Provider: Karie Fetch Treating Provider/Extender: Altamese [place] in Treatment: 4 Diagnosis Coding ICD-10 Codes Code Description L89.154 Pressure ulcer of sacral region, stage 4 G82.21 Paraplegia, complete Facility Procedures CPT4 Code: [id number] Description: 99213 - WOUND CARE VISIT-LEV 3 EST PT Modifier: Quantity: 1 Physician Procedures CPT4 Code: [id number] Description: 99213 - WC PHYS LEVEL 3 - EST PT ICD-10 Diagnosis Description L89.154 Pressure ulcer of sacral region, stage 4 Modifier: Quantity: 1 Electronic Signature(s) Signed: 04/26/2018 4:42:30 PM By: Baltazar Najjar MD Entered By: Baltazar Najjar on 04/26/2018 15:21:19

## 2018-04-28 NOTE — Progress Notes (Signed)
CARDEN, TEEL ([id number]) Visit Report for 04/26/2018 Arrival Information Details Patient Name: Mitchell Dixon, Mitchell Dixon Date of Service: 04/26/2018 1:45 PM Medical Record Number: [id number] Patient Account Number: [id number] Date of Birth/Sex: 1955-05-22 (63 y.o. M) Treating RN: Arnette Norris Primary Care Cathrine Krizan: Karie Fetch Other Clinician: Referring Linsie Lupo: Karie Fetch Treating Quetzalli Clos/Extender: Altamese [place] in Treatment: 4 Visit Information History Since Last Visit Added or deleted any medications: No Patient Arrived: Wheel Chair Any new allergies or adverse reactions: No Arrival Time: 13:54 Had a fall or experienced change in No Accompanied By: brother activities of daily living that may affect Transfer Assistance: EasyPivot Patient risk of falls: Lift Signs or symptoms of abuse/neglect since last visito No Patient Identification Verified: Yes Hospitalized since last visit: No Secondary Verification Process Yes Has Dressing in Place as Prescribed: Yes Completed: Pain Present Now: No Electronic Signature(s) Signed: 04/27/2018 7:54:32 AM By: Arnette Norris Entered By: Arnette Norris on 04/26/2018 13:55:10 Mitchell Dixon ([id number]) -------------------------------------------------------------------------------- Clinic Level of Care Assessment Details Patient Name: Mitchell Dixon Date of Service: 04/26/2018 1:45 PM Medical Record Number: [id number] Patient Account Number: [id number] Date of Birth/Sex: 01/17/1956 (63 y.o. M) Treating RN: Huel Coventry Primary Care Varsha Knock: Karie Fetch Other Clinician: Referring Alonie Gazzola: Karie Fetch Treating Jaeda Bruso/Extender: Altamese [place] in Treatment: 4 Clinic Level of Care Assessment Items TOOL 4 Quantity Score  - Use when only an EandM is performed on FOLLOW-UP visit 0 ASSESSMENTS - Nursing Assessment / Reassessment  - Reassessment of Co-morbidities (includes updates in patient status)  0 X- 1 5 Reassessment of Adherence to Treatment Plan ASSESSMENTS - Wound and Skin Assessment / Reassessment X - Simple Wound Assessment / Reassessment - one wound 1 5  - 0 Complex Wound Assessment / Reassessment - multiple wounds  - 0 Dermatologic / Skin Assessment (not related to wound area) ASSESSMENTS - Focused Assessment  - Circumferential Edema Measurements - multi extremities 0  - 0 Nutritional Assessment / Counseling / Intervention  - 0 Lower Extremity Assessment (monofilament, tuning fork, pulses)  - 0 Peripheral Arterial Disease Assessment (using hand held doppler) ASSESSMENTS - Ostomy and/or Continence Assessment and Care  - Incontinence Assessment and Management 0  - 0 Ostomy Care Assessment and Management (repouching, etc.) PROCESS - Coordination of Care X - Simple Patient / Family Education for ongoing care 1 15  - 0 Complex (extensive) Patient / Family Education for ongoing care X- 1 10 Staff obtains Chiropractor, Records, Test Results / Process Orders  - 0 Staff telephones HHA, Nursing Homes / Clarify orders / etc  - 0 Routine Transfer to another Facility (non-emergent condition)  - 0 Routine Hospital Admission (non-emergent condition)  - 0 New Admissions / Manufacturing engineer / Ordering NPWT, Apligraf, etc.  - 0 Emergency Hospital Admission (emergent condition) X- 1 10 Simple Discharge Coordination SHEAMUS, HASTING ([id number])  - 0 Complex (extensive) Discharge Coordination PROCESS - Special Needs  - Pediatric / Minor Patient Management 0  - 0 Isolation Patient Management  - 0 Hearing / Language / Visual special needs  - 0 Assessment of Community assistance (transportation, D/C planning, etc.)  - 0 Additional assistance / Altered mentation  - 0 Support Surface(s) Assessment (bed, cushion, seat, etc.) INTERVENTIONS - Wound Cleansing / Measurement X - Simple Wound Cleansing - one wound 1 5  -  0 Complex Wound Cleansing - multiple wounds X- 1 5 Wound Imaging (photographs - any number of wounds)  - 0 Wound Tracing (instead of photographs) X- 1 5  Simple Wound Measurement - one wound []  - 0 Complex Wound Measurement - multiple wounds INTERVENTIONS - Wound Dressings []  - Small Wound Dressing one or multiple wounds 0 X- 1 15 Medium Wound Dressing one or multiple wounds []  - 0 Large Wound Dressing one or multiple wounds []  - 0 Application of Medications - topical []  - 0 Application of Medications - injection INTERVENTIONS - Miscellaneous []  - External ear exam 0 []  - 0 Specimen Collection (cultures, biopsies, blood, body fluids, etc.) []  - 0 Specimen(s) / Culture(s) sent or taken to Lab for analysis []  - 0 Patient Transfer (multiple staff / Nurse, adult / Similar devices) []  - 0 Simple Staple / Suture removal (25 or less) []  - 0 Complex Staple / Suture removal (26 or more) []  - 0 Hypo / Hyperglycemic Management (close monitor of Blood Glucose) []  - 0 Ankle / Brachial Index (ABI) - do not check if billed separately X- 1 5 Vital Signs Mastandrea, Mattis N. ([id number]) Has the patient been seen at the hospital within the last three years: Yes Total Score: 80 Level Of Care: New/Established - Level 3 Electronic Signature(s) Signed: 04/26/2018 5:55:39 PM By: Elliot Gurney, BSN, RN, CWS, Kim RN, BSN Entered By: Elliot Gurney, BSN, RN, CWS, Kim on 04/26/2018 14:34:10 Mitchell Dixon ([id number]) -------------------------------------------------------------------------------- Encounter Discharge Information Details Patient Name: Mitchell Dixon Date of Service: 04/26/2018 1:45 PM Medical Record Number: [id number] Patient Account Number: [id number] Date of Birth/Sex: Sep 13, 1955 (62 y.o. M) Treating RN: Rodell Perna Primary Care Aeriel Boulay: Karie Fetch Other Clinician: Referring Ingram Onnen: Karie Fetch Treating Vernette Moise/Extender: Altamese [place] in Treatment: 4 Encounter  Discharge Information Items Discharge Condition: Stable Ambulatory Status: Wheelchair Discharge Destination: Home Transportation: Private Auto Accompanied By: caregiver Schedule Follow-up Appointment: Yes Clinical Summary of Care: Electronic Signature(s) Signed: 04/27/2018 8:11:22 AM By: Rodell Perna Entered By: Rodell Perna on 04/26/2018 14:39:50 Mitchell Dixon ([id number]) -------------------------------------------------------------------------------- Lower Extremity Assessment Details Patient Name: Mitchell Dixon Date of Service: 04/26/2018 1:45 PM Medical Record Number: [id number] Patient Account Number: [id number] Date of Birth/Sex: 06/15/1955 (62 y.o. M) Treating RN: Arnette Norris Primary Care Didier Brandenburg: Karie Fetch Other Clinician: Referring Fayola Meckes: Karie Fetch Treating Zacory Fiola/Extender: Altamese [place] in Treatment: 4 Electronic Signature(s) Signed: 04/27/2018 7:54:32 AM By: Arnette Norris Entered By: Arnette Norris on 04/26/2018 13:55:53 Stecher, [name] ([id number]) -------------------------------------------------------------------------------- Multi Wound Chart Details Patient Name: Mitchell Dixon Date of Service: 04/26/2018 1:45 PM Medical Record Number: [id number] Patient Account Number: [id number] Date of Birth/Sex: 06/28/1955 (63 y.o. M) Treating RN: Huel Coventry Primary Care Nithya Meriweather: Karie Fetch Other Clinician: Referring Taria Castrillo: Karie Fetch Treating Hana Trippett/Extender: Altamese [place] in Treatment: 4 Vital Signs Height(in): 70 Pulse(bpm): 66 Weight(lbs): 140 Blood Pressure(mmHg): 143/90 Body Mass Index(BMI): 20 Temperature(F): 97.5 Respiratory Rate 16 (breaths/min): Photos: [1:No Photos] [N/A:N/A] Wound Location: [1:Sacrum - Midline] [N/A:N/A] Wounding Event: [1:Gradually Appeared] [N/A:N/A] Primary Etiology: [1:Pressure Ulcer] [N/A:N/A] Comorbid History: [1:Arrhythmia, Deep Vein Thrombosis, Hypertension,  History of pressure wounds, Paraplegia] [N/A:N/A] Date Acquired: [1:03/08/2018] [N/A:N/A] Weeks of Treatment: [1:4] [N/A:N/A] Wound Status: [1:Open] [N/A:N/A] Measurements L x W x D [1:2.1x2.2x1.5] [N/A:N/A] (cm) Area (cm) : [1:3.629] [N/A:N/A] Volume (cm) : [1:5.443] [N/A:N/A] % Reduction in Area: [1:53.30%] [N/A:N/A] % Reduction in Volume: [1:36.40%] [N/A:N/A] Position 1 (o'clock): [1:12] Maximum Distance 1 (cm): [1:2.8] Starting Position 1 [1:11] (o'clock): Ending Position 1 [1:1] (o'clock): Maximum Distance 1 (cm): [1:2.8] Tunneling: [1:Yes] [N/A:N/A] Undermining: [1:Yes] [N/A:N/A] Classification: [1:Category/Stage IV] [N/A:N/A] Exudate Amount: [1:Large] [N/A:N/A] Exudate Type: [1:Serosanguineous] [N/A:N/A] Exudate Color: [1:red, brown] [N/A:N/A]  Foul Odor After Cleansing: [1:Yes] [N/A:N/A] Odor Anticipated Due to [1:No] [N/A:N/A] Product Use: Wound Margin: [1:Flat and Intact] [N/A:N/A] Granulation Amount: [1:None Present (0%)] [N/A:N/A] Necrotic Amount: [1:Medium (34-66%)] [N/A:N/A] Necrotic Tissue: [1:Eschar, Adherent Slough] [N/A:N/A] Exposed Structures: Fat Layer (Subcutaneous N/A N/A Tissue) Exposed: Yes Muscle: Yes Fascia: No Tendon: No Joint: No Bone: No Epithelialization: None N/A N/A Periwound Skin Texture: Excoriation: No N/A N/A Induration: No Callus: No Crepitus: No Rash: No Scarring: No Periwound Skin Moisture: Maceration: No N/A N/A Dry/Scaly: No Periwound Skin Color: Atrophie Blanche: No N/A N/A Cyanosis: No Ecchymosis: No Erythema: No Hemosiderin Staining: No Mottled: No Pallor: No Rubor: No Temperature: No Abnormality N/A N/A Tenderness on Palpation: No N/A N/A Wound Preparation: Ulcer Cleansing: N/A N/A Rinsed/Irrigated with Saline Topical Anesthetic Applied: Other: lidocaine 4% Treatment Notes Wound #1 (Midline Sacrum) Notes silvercel, bordered foam dressing Electronic Signature(s) Signed: 04/26/2018 4:42:30 PM By: Baltazar Najjar MD Entered By: Baltazar Najjar on 04/26/2018 15:13:06 Mitchell Dixon ([id number]) -------------------------------------------------------------------------------- Multi-Disciplinary Care Plan Details Patient Name: Mitchell Dixon Date of Service: 04/26/2018 1:45 PM Medical Record Number: [id number] Patient Account Number: [id number] Date of Birth/Sex: [birth year] (62 y.o. M) Treating RN: Huel Coventry Primary Care Leimomi Zervas: Karie Fetch Other Clinician: Referring Tamaria Dunleavy: Karie Fetch Treating Julie Nay/Extender: Altamese [place] in Treatment: 4 Active Inactive Necrotic Tissue Nursing Diagnoses: Impaired tissue integrity related to necrotic/devitalized tissue Goals: Necrotic/devitalized tissue will be minimized in the wound bed Date Initiated: 03/29/2018 Target Resolution Date: 03/29/2018 Goal Status: Active Interventions: Assess patient pain level pre-, during and post procedure and prior to discharge Treatment Activities: Apply topical anesthetic as ordered : 03/29/2018 Excisional debridement : 03/29/2018 Notes: Orientation to the Wound Care Program Nursing Diagnoses: Knowledge deficit related to the wound healing center program Goals: Patient/caregiver will verbalize understanding of the Wound Healing Center Program Date Initiated: 03/29/2018 Target Resolution Date: 03/29/2018 Goal Status: Active Interventions: Provide education on orientation to the wound center Notes: Pressure Nursing Diagnoses: Knowledge deficit related to management of pressures ulcers Potential for impaired tissue integrity related to pressure, friction, moisture, and shear Goals: Patient/caregiver will verbalize understanding of pressure ulcer management MANCEL, LARDIZABAL ([id number]) Date Initiated: 03/29/2018 Target Resolution Date: 03/29/2018 Goal Status: Active Interventions: Assess: immobility, friction, shearing, incontinence upon admission and as needed Provide education  on pressure ulcers Treatment Activities: Patient referred for home evaluation of offloading devices/mattresses : 03/29/2018 Notes: Soft Tissue Infection Nursing Diagnoses: Impaired tissue integrity Potential for infection: soft tissue Goals: Patient will remain free of wound infection Date Initiated: 03/29/2018 Target Resolution Date: 03/29/2018 Goal Status: Active Signs and symptoms of infection will be recognized early to allow for prompt treatment Date Initiated: 03/29/2018 Target Resolution Date: 03/29/2018 Goal Status: Active Interventions: Assess signs and symptoms of infection every visit Treatment Activities: Culture and sensitivity : 03/29/2018 Notes: Wound/Skin Impairment Nursing Diagnoses: Impaired tissue integrity Goals: Patient/caregiver will verbalize understanding of skin care regimen Date Initiated: 03/29/2018 Target Resolution Date: 03/29/2018 Goal Status: Active Ulcer/skin breakdown will have a volume reduction of 30% by week 4 Date Initiated: 03/29/2018 Target Resolution Date: 03/29/2018 Goal Status: Active Interventions: Assess ulceration(s) every visit Treatment Activities: Patient referred to home care : 03/29/2018 Skin care regimen initiated : 03/29/2018 HOKE, BAER ([id number]) Notes: Electronic Signature(s) Signed: 04/26/2018 5:55:39 PM By: Elliot Gurney, BSN, RN, CWS, Kim RN, BSN Entered By: Elliot Gurney, BSN, RN, CWS, Kim on 04/26/2018 14:30:32 Mitchell Dixon ([id number]) -------------------------------------------------------------------------------- Pain Assessment Details Patient Name: Mitchell Dixon Date of Service: 04/26/2018 1:45 PM Medical Record Number:  [id number] Patient Account Number: [id number] Date of Birth/Sex: October 29, 1955 (63 y.o. M) Treating RN: Arnette Norris Primary Care Colie Fugitt: Karie Fetch Other Clinician: Referring Rolin Schult: Karie Fetch Treating Joncarlos Atkison/Extender: Altamese [place] in Treatment: 4 Active  Problems Location of Pain Severity and Description of Pain Patient Has Paino No Site Locations Pain Management and Medication Current Pain Management: Electronic Signature(s) Signed: 04/27/2018 7:54:32 AM By: Arnette Norris Entered By: Arnette Norris on 04/26/2018 13:55:17 Mitchell Dixon ([id number]) -------------------------------------------------------------------------------- Patient/Caregiver Education Details Patient Name: Mitchell Dixon Date of Service: 04/26/2018 1:45 PM Medical Record Number: [id number] Patient Account Number: [id number] Date of Birth/Gender: 04/23/1955 (63 y.o. M) Treating RN: Huel Coventry Primary Care Physician: Karie Fetch Other Clinician: Referring Physician: Karie Fetch Treating Physician/Extender: Altamese [place] in Treatment: 4 Education Assessment Education Provided To: Patient Education Topics Provided Pressure: Handouts: Pressure Ulcers: Care and Offloading Methods: Demonstration Responses: State content correctly Welcome To The W[facility] Center: Handouts: Welcome To The [facility] Methods: Demonstration, Explain/Verbal Responses: State content correctly Electronic Signature(s) Signed: 04/26/2018 5:55:39 PM By: Elliot Gurney, BSN, RN, CWS, Kim RN, BSN Entered By: Elliot Gurney, BSN, RN, CWS, Kim on 04/26/2018 14:34:37 Mitchell Dixon ([id number]) -------------------------------------------------------------------------------- Wound Assessment Details Patient Name: Mitchell Dixon Date of Service: 04/26/2018 1:45 PM Medical Record Number: [id number] Patient Account Number: [id number] Date of Birth/Sex: 1955-08-15 (62 y.o. M) Treating RN: Arnette Norris Primary Care Valma Rotenberg: Karie Fetch Other Clinician: Referring Marjan Rosman: Karie Fetch Treating Talena Neira/Extender: Altamese  in Treatment: 4 Wound Status Wound Number: 1 Primary Pressure Ulcer Etiology: Wound Location: Sacrum - Midline Wound  Open Wounding Event: Gradually Appeared Status: Date Acquired: 03/08/2018 Comorbid Arrhythmia, Deep Vein Thrombosis, Weeks Of Treatment: 4 History: Hypertension, History of pressure wounds, Clustered Wound: No Paraplegia Photos Photo Uploaded By: Arnette Norris on 04/26/2018 16:24:31 Wound Measurements Length: (cm) 2.1 Width: (cm) 2.2 Depth: (cm) 1.5 Area: (cm) 3.629 Volume: (cm) 5.443 % Reduction in Area: 53.3% % Reduction in Volume: 36.4% Epithelialization: None Tunneling: Yes Position (o'clock): 12 Maximum Distance: (cm) 2.8 Undermining: Yes Starting Position (o'clock): 11 Ending Position (o'clock): 1 Maximum Distance: (cm) 2.8 Wound Description Classification: Category/Stage IV Wound Margin: Flat and Intact Exudate Amount: Large Exudate Type: Serosanguineous Exudate Color: red, brown Foul Odor After Cleansing: Yes Due to Product Use: No Slough/Fibrino Yes Wound Bed Granulation Amount: None Present (0%) Exposed Structure Necrotic Amount: Medium (34-66%) Fascia Exposed: No ANREW, BUCHKO ([id number]) Necrotic Quality: Eschar, Adherent Slough Fat Layer (Subcutaneous Tissue) Exposed: Yes Tendon Exposed: No Muscle Exposed: Yes Necrosis of Muscle: No Joint Exposed: No Bone Exposed: No Periwound Skin Texture Texture Color No Abnormalities Noted: No No Abnormalities Noted: No Callus: No Atrophie Blanche: No Crepitus: No Cyanosis: No Excoriation: No Ecchymosis: No Induration: No Erythema: No Rash: No Hemosiderin Staining: No Scarring: No Mottled: No Pallor: No Moisture Rubor: No No Abnormalities Noted: No Dry / Scaly: No Temperature / Pain Maceration: No Temperature: No Abnormality Wound Preparation Ulcer Cleansing: Rinsed/Irrigated with Saline Topical Anesthetic Applied: Other: lidocaine 4%, Treatment Notes Wound #1 (Midline Sacrum) Notes silvercel, bordered foam dressing Electronic Signature(s) Signed: 04/27/2018 7:54:32 AM By:  Arnette Norris Entered By: Arnette Norris on 04/26/2018 14:01:55 Mitchell Dixon ([id number]) -------------------------------------------------------------------------------- Vitals Details Patient Name: Mitchell Dixon Date of Service: 04/26/2018 1:45 PM Medical Record Number: [id number] Patient Account Number: [id number] Date of Birth/Sex: Nov 28, 1955 (63 y.o. M) Treating RN: Arnette Norris Primary Care Joden Bonsall: Karie Fetch Other Clinician: Referring Clearance Chenault: Karie Fetch Treating Atiana Levier/Extender: Maxwell Caul Weeks in Treatment: 4  Vital Signs Time Taken: 13:55 Temperature (F): 97.5 Height (in): 70 Pulse (bpm): 66 Weight (lbs): 140 Respiratory Rate (breaths/min): 16 Body Mass Index (BMI): 20.1 Blood Pressure (mmHg): 143/90 Reference Range: 80 - 120 mg / dl Electronic Signature(s) Signed: 04/27/2018 7:54:32 AM By: Arnette Norris Entered By: Arnette Norris on 04/26/2018 13:55:43

## 2018-04-29 LAB — AEROBIC CULTURE W GRAM STAIN (SUPERFICIAL SPECIMEN)

## 2018-04-29 LAB — AEROBIC CULTURE  (SUPERFICIAL SPECIMEN)

## 2018-05-03 ENCOUNTER — Encounter: Payer: Medicaid Other | Attending: Internal Medicine | Admitting: Internal Medicine

## 2018-05-03 DIAGNOSIS — I10 Essential (primary) hypertension: Secondary | ICD-10-CM | POA: Diagnosis not present

## 2018-05-03 DIAGNOSIS — Z86718 Personal history of other venous thrombosis and embolism: Secondary | ICD-10-CM | POA: Insufficient documentation

## 2018-05-03 DIAGNOSIS — G8221 Paraplegia, complete: Secondary | ICD-10-CM | POA: Diagnosis not present

## 2018-05-03 DIAGNOSIS — Q211 Atrial septal defect: Secondary | ICD-10-CM | POA: Insufficient documentation

## 2018-05-03 DIAGNOSIS — Z950 Presence of cardiac pacemaker: Secondary | ICD-10-CM | POA: Diagnosis not present

## 2018-05-03 DIAGNOSIS — L89154 Pressure ulcer of sacral region, stage 4: Secondary | ICD-10-CM | POA: Diagnosis not present

## 2018-05-03 DIAGNOSIS — I4891 Unspecified atrial fibrillation: Secondary | ICD-10-CM | POA: Insufficient documentation

## 2018-05-03 DIAGNOSIS — Z7901 Long term (current) use of anticoagulants: Secondary | ICD-10-CM | POA: Diagnosis not present

## 2018-05-05 NOTE — Progress Notes (Signed)
FLOURNOY, BAYERL ([id number]) Visit Report for 05/03/2018 HPI Details Patient Name: Mitchell Dixon, Mitchell Dixon Date of Service: 05/03/2018 1:45 PM Medical Record Number: [id number] Patient Account Number: [id number] Date of Birth/Sex: 1956/02/09 (63 y.o. 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 He has been followed at the [facility] wound care clinic/podiatry for wounds on his bilateral feet. He has home health. We did not look at these today. Over the last 3 weeks they noticed a small area on his sacrum which has matured into a fairly substantial pressure ulcer. He is here for our review of this. 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 He also required an embolectomy and fasciectomy of the right leg and he currently has wounds on the right heel and left ankle but as stated these are being followed at [facility]. 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 X-ray we did last week did not show evidence of acute osteomyelitis in the sacrum. The patient has a pacemaker and will need a CT scan of this area to exclude osteomyelitis. Swab culture I did showed a few Enterococcus faecalis and a few enterococcus avium I  started him on Augmentin 875 twice daily for 10 days on 2/3 2/12; patient is still on Augmentin but he should finish up before I see him next week. We have had some trouble with Medicaid and CT scan authorization. The wound is probably actually on S4-S5 so we will ask for a CT scan of this area rather than the in the pelvis. We have been using silver alginate as the primary dressing in the wound is cleaning up quite nicely 2/19; 2/19; the patient has completed antibiotics. His CT scan is booked for 2/25; we have been using silver alginate. 2/26; the patient had a CT scan done. Unfortunately they did not even comment on the area of the lower sacrum that we wanted them to look at. However they discovered an incidentally very large complex mass anterior to the left hip and left femur measuring 13.4 x 7.3 x 5.1. This had the appearance of a possible bone or soft tissue malignancy. They recommended an MRI but he has a pacemaker and I think that would preclude that. I have sent him for review by orthopedics. I suspect they will need to biopsy. Wound is about the same size. Some serosanguineous drainage we have been using silver alginate 3/4; using silver alginate. Culture I did last week showed few MSSA and Corynebacterium. I put him on Keflex. We are attempting to arrange an orthopedic consult Electronic Signature(s) Signed: 05/03/2018 5:05:37 PM By: Baltazar Najjar MD Entered By: Baltazar Najjar on 05/03/2018 15:01:30 Mitchell Dixon, Mitchell Dixon ([id number]) -------------------------------------------------------------------------------- Physical Exam Details Patient Name: Mitchell Dixon Date of Service: 05/03/2018 1:45 PM Medical Record  Number: [id number] Patient Account Number: [id number] Date of Birth/Sex: March 12, 1955 (63 y.o. M) Treating RN: Huel Coventry Primary Care Provider: Karie Fetch Other Clinician: Referring Provider: Karie Fetch Treating Provider/Extender: Altamese [place] in  Treatment: 5 Constitutional Sitting or standing Blood Pressure is within target range for patient.. Pulse regular and within target range for patient.Marland Kitchen Respirations regular, non-labored and within target range.. Temperature is normal and within the target range for the patient.Marland Kitchen appears in no distress. Eyes Conjunctivae clear. No discharge. Respiratory Respiratory effort is easy and symmetric bilaterally. Rate is normal at rest and on room air.. Cardiovascular Appears well-hydrated. Integumentary (Hair, Skin) No erythema around the wound. Psychiatric No evidence of depression, anxiety, or agitation. Calm, cooperative, and communicative. Appropriate interactions and affect.. Notes Wound exam; the area in question is on the lower sacrum. Somewhat smaller dimensions although he still has the tunneling area towards 12:00 at 2 cm. No drainage noted. The soft tissue around the wound looks satisfactory no crepitus Electronic Signature(s) Signed: 05/03/2018 5:05:37 PM By: Baltazar Najjar MD Entered By: Baltazar Najjar on 05/03/2018 15:03:41 Mitchell Dixon ([id number]) -------------------------------------------------------------------------------- Physician Orders Details Patient Name: Mitchell Dixon Date of Service: 05/03/2018 1:45 PM Medical Record Number: [id number] Patient Account Number: [id number] Date of Birth/Sex: 04/09/1955 (62 y.o. M) Treating RN: Huel Coventry Primary Care Provider: Karie Fetch Other Clinician: Referring Provider: Karie Fetch Treating Provider/Extender: Altamese [place] in Treatment: 5 Verbal / Phone Orders: No Diagnosis Coding Wound Cleansing Wound #1 Midline Sacrum o Clean wound with Normal Saline. Anesthetic (add to Medication List) Wound #1 Midline Sacrum o Topical Lidocaine 4% cream applied to wound bed prior to debridement (In Clinic Only). Primary Wound Dressing Wound #1 Midline Sacrum o Silver Alginate Secondary Dressing Wound  #1 Midline Sacrum o Boardered Foam Dressing Dressing Change Frequency Wound #1 Midline Sacrum o Change Dressing Monday, Wednesday, Friday Follow-up Appointments Wound #1 Midline Sacrum o Return Appointment in 1 week. Off-Loading Wound #1 Midline Sacrum o Turn and reposition every 2 hours Home Health Wound #1 Midline Sacrum o Continue Home Health Visits - [facility] o Home Health Nurse may visit PRN to address patientos wound care needs. o FACE TO FACE ENCOUNTER: MEDICARE and MEDICAID PATIENTS: I certify that this patient is under my care and that I had a face-to-face encounter that meets the physician face-to-face encounter requirements with this patient on this date. The encounter with the patient was in whole or in part for the following MEDICAL CONDITION: (primary reason for Home Healthcare) MEDICAL NECESSITY: I certify, that based on my findings, NURSING services are a medically necessary home health service. HOME BOUND STATUS: I certify that my clinical findings support that this patient is homebound (i.e., Due to illness or injury, pt requires aid of supportive devices such as crutches, cane, wheelchairs, walkers, the use of special transportation or the assistance of another person to leave their place of residence. There is a normal inability to leave the home and doing so requires considerable and taxing effort. Other absences are for medical reasons / religious services and are infrequent or of short duration when for other reasons). Mitchell Dixon, Mitchell Dixon ([id number]) o If current dressing causes regression in wound condition, may D/C ordered dressing product/s and apply Normal Saline Moist Dressing daily until next Wound Healing Center / Other MD appointment. Notify Wound Healing Center of regression in wound condition at [phone]. o Please direct any NON-WOUND related issues/requests for orders to patient's Primary Care Physician Electronic  Signature(s) Signed:  05/03/2018 5:05:37 PM By: Baltazar Najjar MD Signed: 05/03/2018 5:29:19 PM By: Elliot Gurney BSN, RN, CWS, Kim RN, BSN Entered By: Elliot Gurney, BSN, RN, CWS, Kim on 05/03/2018 14:27:56 Mitchell Dixon, Mitchell Dixon ([id number]) -------------------------------------------------------------------------------- Problem List Details Patient Name: Mitchell Dixon Date of Service: 05/03/2018 1:45 PM Medical Record Number: [id number] Patient Account Number: [id number] Date of Birth/Sex: [birth year] (62 y.o. M) Treating RN: Huel Coventry Primary Care Provider: Karie Fetch Other Clinician: Referring Provider: Karie Fetch Treating Provider/Extender: Altamese [place] in Treatment: 5 Active Problems ICD-10 Evaluated Encounter Code Description Active Date Today Diagnosis L89.154 Pressure ulcer of sacral region, stage 4 03/29/2018 No Yes G82.21 Paraplegia, complete 03/29/2018 No Yes Inactive Problems Resolved Problems ICD-10 Code Description Active Date Resolved Date L89.312 Pressure ulcer of right buttock, stage 2 03/29/2018 03/29/2018 Electronic Signature(s) Signed: 05/03/2018 5:05:37 PM By: Baltazar Najjar MD Entered By: Baltazar Najjar on 05/03/2018 15:00:33 Mitchell Dixon ([id number]) -------------------------------------------------------------------------------- Progress Note Details Patient Name: Mitchell Dixon Date of Service: 05/03/2018 1:45 PM Medical Record Number: [id number] Patient Account Number: [id number] Date of Birth/Sex: 1955-03-24 (62 y.o. 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 He has been followed at the [facility] wound care  clinic/podiatry for wounds on his bilateral feet. He has home health. We did not look at these today. Over the last 3 weeks they noticed a small area on his sacrum which has matured into a fairly substantial pressure ulcer. He is here for our review of this. 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 He also required an embolectomy and fasciectomy of the right leg and he currently has wounds on the right heel and left ankle but as stated these are being followed at [facility]. 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 X-ray we did last week did not show evidence of acute osteomyelitis in the sacrum. The patient has a pacemaker and will need a CT scan of this area to exclude osteomyelitis. Swab culture I did showed a few Enterococcus faecalis and a few enterococcus avium I started him on Augmentin 875 twice daily for 10 days on 2/3 2/12; patient is still on Augmentin but he should finish up before I see him next week. We have had some trouble with Medicaid and CT scan authorization. The wound is probably actually on S4-S5 so we will ask for a CT scan of this area rather than the in the pelvis. We have been using silver alginate as the primary dressing in the wound is cleaning up quite nicely 2/19; 2/19; the patient has completed antibiotics. His CT scan is booked for 2/25; we have been using silver alginate. 2/26; the patient had a CT scan done. Unfortunately they did not even comment on the area of the lower sacrum that we  wanted them to look at.  However they discovered an incidentally very large complex mass anterior to the left hip and left femur measuring 13.4 x 7.3 x 5.1. This had the appearance of a possible bone or soft tissue malignancy. They recommended an MRI but he has a pacemaker and I think that would preclude that. I have sent him for review by orthopedics. I suspect they will need to biopsy. Wound is about the same size. Some serosanguineous drainage we have been using silver alginate 3/4; using silver alginate. Culture I did last week showed few MSSA and Corynebacterium. I put him on Keflex. We are attempting to arrange an orthopedic consult Objective Constitutional Mitchell Dixon, Mitchell Dixon. ([id number]) Sitting or standing Blood Pressure is within target range for patient.. Pulse regular and within target range for patient.Marland Kitchen Respirations regular, non-labored and within target range.. Temperature is normal and within the target range for the patient.Marland Kitchen appears in no distress. Vitals Time Taken: 2:00 PM, Height: 70 in, Weight: 140 lbs, BMI: 20.1, Temperature: 97.6 F, Pulse: 79 bpm, Respiratory Rate: 18 breaths/min, Blood Pressure: 118/71 mmHg. Eyes Conjunctivae clear. No discharge. Respiratory Respiratory effort is easy and symmetric bilaterally. Rate is normal at rest and on room air.. Cardiovascular Appears well-hydrated. Psychiatric No evidence of depression, anxiety, or agitation. Calm, cooperative, and communicative. Appropriate interactions and affect.. General Notes: Wound exam; the area in question is on the lower sacrum. Somewhat smaller dimensions although he still has the tunneling area towards 12:00 at 2 cm. No drainage noted. The soft tissue around the wound looks satisfactory no crepitus Integumentary (Hair, Skin) No erythema around the wound. Wound #1 status is Open. Original cause of wound was Gradually Appeared. The wound is located on the Midline Sacrum. The wound measures 1.7cm length x 2.5cm width x  1.5cm depth; 3.338cm^2 area and 5.007cm^3 volume. There is muscle and Fat Layer (Subcutaneous Tissue) Exposed exposed. There is no undermining noted, however, there is tunneling at 12:00 with a maximum distance of 2.2cm. There is a large amount of serosanguineous drainage noted. Foul odor after cleansing was noted. The wound margin is flat and intact. There is no granulation within the wound bed. There is a medium (34-66%) amount of necrotic tissue within the wound bed including Adherent Slough. The periwound skin appearance did not exhibit: Callus, Crepitus, Excoriation, Induration, Rash, Scarring, Dry/Scaly, Maceration, Atrophie Blanche, Cyanosis, Ecchymosis, Hemosiderin Staining, Mottled, Pallor, Rubor, Erythema. Periwound temperature was noted as No Abnormality. Assessment Active Problems ICD-10 Pressure ulcer of sacral region, stage 4 Paraplegia, complete Plan Wound Cleansing: Wound #1 Midline Sacrum: Clean wound with Normal Saline. Mitchell Dixon, Mitchell Dixon ([id number]) Anesthetic (add to Medication List): Wound #1 Midline Sacrum: Topical Lidocaine 4% cream applied to wound bed prior to debridement (In Clinic Only). Primary Wound Dressing: Wound #1 Midline Sacrum: Silver Alginate Secondary Dressing: Wound #1 Midline Sacrum: Boardered Foam Dressing Dressing Change Frequency: Wound #1 Midline Sacrum: Change Dressing Monday, Wednesday, Friday Follow-up Appointments: Wound #1 Midline Sacrum: Return Appointment in 1 week. Off-Loading: Wound #1 Midline Sacrum: Turn and reposition every 2 hours Home Health: Wound #1 Midline Sacrum: Continue Home Health Visits - [facility] Nurse may visit PRN to address patient s wound care needs. FACE TO FACE ENCOUNTER: MEDICARE and MEDICAID PATIENTS: I certify that this patient is under my care and that I had a face-to-face encounter that meets the physician face-to-face encounter requirements with this patient on this date.  The encounter with the patient was in whole or in part  for the following MEDICAL CONDITION: (primary reason for Home Healthcare) MEDICAL NECESSITY: I certify, that based on my findings, NURSING services are a medically necessary home health service. HOME BOUND STATUS: I certify that my clinical findings support that this patient is homebound (i.e., Due to illness or injury, pt requires aid of supportive devices such as crutches, cane, wheelchairs, walkers, the use of special transportation or the assistance of another person to leave their place of residence. There is a normal inability to leave the home and doing so requires considerable and taxing effort. Other absences are for medical reasons / religious services and are infrequent or of short duration when for other reasons). If current dressing causes regression in wound condition, may D/C ordered dressing product/s and apply Normal Saline Moist Dressing daily until next Wound Healing Center / Other MD appointment. Notify Wound Healing Center of regression in wound condition at [phone]. Please direct any NON-WOUND related issues/requests for orders to patient's Primary Care Physician 1. Silver alginate to continue 2. He is on antibiotics 3. Attempting to arrange his orthopedic consult with regards to the left hip/femur mass Electronic Signature(s) Signed: 05/03/2018 5:05:37 PM By: Baltazar Najjar MD Entered By: Baltazar Najjar on 05/03/2018 15:04:21 Mitchell Dixon ([id number]) -------------------------------------------------------------------------------- SuperBill Details Patient Name: Mitchell Dixon Date of Service: 05/03/2018 Medical Record Number: [id number] Patient Account Number: [id number] Date of Birth/Sex: 05-07-1955 (63 y.o. M) Treating RN: Huel Coventry Primary Care Provider: Karie Fetch Other Clinician: Referring Provider: Karie Fetch Treating Provider/Extender: Altamese [place] in Treatment:  5 Diagnosis Coding ICD-10 Codes Code Description L89.154 Pressure ulcer of sacral region, stage 4 G82.21 Paraplegia, complete Facility Procedures CPT4 Code: [id number] Description: 99213 - WOUND CARE VISIT-LEV 3 EST PT Modifier: Quantity: 1 Physician Procedures CPT4 Code: [id number] Description: 99213 - WC PHYS LEVEL 3 - EST PT ICD-10 Diagnosis Description L89.154 Pressure ulcer of sacral region, stage 4 G82.21 Paraplegia, complete Modifier: Quantity: 1 Electronic Signature(s) Signed: 05/03/2018 5:05:37 PM By: Baltazar Najjar MD Entered By: Baltazar Najjar on 05/03/2018 15:04:38

## 2018-05-07 NOTE — Progress Notes (Signed)
LENYX, SHAMBERGER ([id number]) Visit Report for 05/03/2018 Arrival Information Details Patient Name: Mitchell Dixon, Mitchell Dixon Date of Service: 05/03/2018 1:45 PM Medical Record Number: [id number] Patient Account Number: [id number] Date of Birth/Sex: 01/01/1956 (63 y.o. M) Treating RN: Arnette Norris Primary Care Kyndall Chaplin: Karie Fetch Other Clinician: Referring Kjersti Dittmer: Karie Fetch Treating Barabara Motz/Extender: Altamese [place] in Treatment: 5 Visit Information History Since Last Visit Added or deleted any medications: No Patient Arrived: Wheel Chair Any new allergies or adverse reactions: No Arrival Time: 13:54 Had a fall or experienced change in No Accompanied By: brother activities of daily living that may affect Transfer Assistance: Other risk of falls: Patient Identification Verified: Yes Signs or symptoms of abuse/neglect since last visito No Secondary Verification Process Completed: Yes Hospitalized since last visit: No Has Dressing in Place as Prescribed: Yes Pain Present Now: No Electronic Signature(s) Signed: 05/05/2018 3:21:09 PM By: Arnette Norris Entered By: Arnette Norris on 05/03/2018 13:59:41 [name] ([id number]) -------------------------------------------------------------------------------- Clinic Level of Care Assessment Details Patient Name: [name] Date of Service: 05/03/2018 1:45 PM Medical Record Number: [id number] Patient Account Number: [id number] Date of Birth/Sex: March 05, 1955 (62 y.o. M) Treating RN: Huel Coventry Primary Care Dorisann Schwanke: Karie Fetch Other Clinician: Referring Wakisha Alberts: Karie Fetch Treating Pat Sires/Extender: Altamese [place] in Treatment: 5 Clinic Level of Care Assessment Items TOOL 4 Quantity Score []  - Use when only an EandM is performed on FOLLOW-UP visit 0 ASSESSMENTS - Nursing Assessment / Reassessment []  - Reassessment of Co-morbidities (includes updates in patient status) 0 X- 1 5 Reassessment  of Adherence to Treatment Plan ASSESSMENTS - Wound and Skin Assessment / Reassessment X - Simple Wound Assessment / Reassessment - one wound 1 5 []  - 0 Complex Wound Assessment / Reassessment - multiple wounds []  - 0 Dermatologic / Skin Assessment (not related to wound area) ASSESSMENTS - Focused Assessment []  - Circumferential Edema Measurements - multi extremities 0 []  - 0 Nutritional Assessment / Counseling / Intervention []  - 0 Lower Extremity Assessment (monofilament, tuning fork, pulses) []  - 0 Peripheral Arterial Disease Assessment (using hand held doppler) ASSESSMENTS - Ostomy and/or Continence Assessment and Care []  - Incontinence Assessment and Management 0 []  - 0 Ostomy Care Assessment and Management (repouching, etc.) PROCESS - Coordination of Care X - Simple Patient / Family Education for ongoing care 1 15 []  - 0 Complex (extensive) Patient / Family Education for ongoing care X- 1 10 Staff obtains Chiropractor, Records, Test Results / Process Orders []  - 0 Staff telephones HHA, Nursing Homes / Clarify orders / etc []  - 0 Routine Transfer to another Facility (non-emergent condition) []  - 0 Routine Hospital Admission (non-emergent condition) []  - 0 New Admissions / Manufacturing engineer / Ordering NPWT, Apligraf, etc. []  - 0 Emergency Hospital Admission (emergent condition) X- 1 10 Simple Discharge Coordination DEVLAN, STAVINOHA ([id number]) []  - 0 Complex (extensive) Discharge Coordination PROCESS - Special Needs []  - Pediatric / Minor Patient Management 0 []  - 0 Isolation Patient Management []  - 0 Hearing / Language / Visual special needs []  - 0 Assessment of Community assistance (transportation, D/C planning, etc.) []  - 0 Additional assistance / Altered mentation []  - 0 Support Surface(s) Assessment (bed, cushion, seat, etc.) INTERVENTIONS - Wound Cleansing / Measurement X - Simple Wound Cleansing - one wound 1 5 []  - 0 Complex Wound Cleansing -  multiple wounds X- 1 5 Wound Imaging (photographs - any number of wounds) []  - 0 Wound Tracing (instead of photographs) X- 1 5 Simple Wound  Measurement - one wound  - 0 Complex Wound Measurement - multiple wounds INTERVENTIONS - Wound Dressings  - Small Wound Dressing one or multiple wounds 0 X- 1 15 Medium Wound Dressing one or multiple wounds  - 0 Large Wound Dressing one or multiple wounds  - 0 Application of Medications - topical  - 0 Application of Medications - injection INTERVENTIONS - Miscellaneous  - External ear exam 0  - 0 Specimen Collection (cultures, biopsies, blood, body fluids, etc.)  - 0 Specimen(s) / Culture(s) sent or taken to Lab for analysis  - 0 Patient Transfer (multiple staff / Nurse, adult / Similar devices)  - 0 Simple Staple / Suture removal (25 or less)  - 0 Complex Staple / Suture removal (26 or more)  - 0 Hypo / Hyperglycemic Management (close monitor of Blood Glucose)  - 0 Ankle / Brachial Index (ABI) - do not check if billed separately X- 1 5 Vital Signs Salvia, Joe N. ([id number]) Has the patient been seen at the hospital within the last three years: Yes Total Score: 80 Level Of Care: New/Established - Level 3 Electronic Signature(s) Signed: 05/03/2018 5:29:19 PM By: Elliot Gurney, BSN, RN, CWS, Kim RN, BSN Entered By: Elliot Gurney, BSN, RN, CWS, Kim on 05/03/2018 14:28:36 [name] ([id number]) -------------------------------------------------------------------------------- Encounter Discharge Information Details Patient Name: [name] Date of Service: 05/03/2018 1:45 PM Medical Record Number: [id number] Patient Account Number: [id number] Date of Birth/Sex: October 30, 1955 (62 y.o. M) Treating RN: Curtis Sites Primary Care Rowen Hur: Karie Fetch Other Clinician: Referring Alfons Sulkowski: Karie Fetch Treating Amybeth Sieg/Extender: Altamese [place] in Treatment: 5 Encounter Discharge Information  Items Discharge Condition: Stable Ambulatory Status: Wheelchair Discharge Destination: Home Transportation: Private Auto Accompanied By: brother Schedule Follow-up Appointment: Yes Clinical Summary of Care: Electronic Signature(s) Signed: 05/03/2018 4:44:30 PM By: Dayton Martes RCP, RRT, CHT Entered By: Dayton Martes on 05/03/2018 16:03:17 [name] ([id number]) -------------------------------------------------------------------------------- Lower Extremity Assessment Details Patient Name: [name] Date of Service: 05/03/2018 1:45 PM Medical Record Number: [id number] Patient Account Number: [id number] Date of Birth/Sex: April 05, 1955 (62 y.o. M) Treating RN: Arnette Norris Primary Care Roseanna Koplin: Karie Fetch Other Clinician: Referring Tramayne Sebesta: Karie Fetch Treating Ifrah Vest/Extender: Maxwell Caul [place] in Treatment: 5 Electronic Signature(s) Signed: 05/05/2018 3:21:09 PM By: Arnette Norris Entered By: Arnette Norris on 05/03/2018 14:00:27 [name] ([id number]) -------------------------------------------------------------------------------- Multi Wound Chart Details Patient Name: [name] Date of Service: 05/03/2018 1:45 PM Medical Record Number: [id number] Patient Account Number: [id number] Date of Birth/Sex: [birth year] (63 y.o. M) Treating RN: Huel Coventry Primary Care Martina Brodbeck: Karie Fetch Other Clinician: Referring Tyechia Allmendinger: Karie Fetch Treating Cherese Lozano/Extender: Altamese  in Treatment: 5 Vital Signs Height(in): 70 Pulse(bpm): 79 Weight(lbs): 140 Blood Pressure(mmHg): 118/71 Body Mass Index(BMI): 20 Temperature(F): 97.6 Respiratory Rate 18 (breaths/min): Photos: [N/A:N/A] Wound Location: Sacrum - Midline N/A N/A Wounding Event: Gradually Appeared N/A N/A Primary Etiology: Pressure Ulcer N/A N/A Comorbid History: Arrhythmia, Deep Vein N/A N/A Thrombosis, Hypertension, History  of pressure wounds, Paraplegia Date Acquired: 03/08/2018 N/A N/A Weeks of Treatment: 5 N/A N/A Wound Status: Open N/A N/A Measurements L x W x D 1.7x2.5x1.5 N/A N/A (cm) Area (cm) : 3.338 N/A N/A Volume (cm) : 5.007 N/A N/A % Reduction in Area: 57.10% N/A N/A % Reduction in Volume: 41.50% N/A N/A Position 1 (o'clock): 12 Maximum Distance 1 (cm): 2.2 Tunneling: Yes N/A N/A Classification: Category/Stage IV N/A N/A Exudate Amount: Large N/A N/A Exudate Type: Serosanguineous N/A N/A Exudate Color: red, brown N/A N/A  Foul Odor After Cleansing: Yes N/A N/A Odor Anticipated Due to No N/A N/A Product Use: Wound Margin: Flat and Intact N/A N/A Granulation Amount: None Present (0%) N/A N/A Necrotic Amount: Medium (34-66%) N/A N/A HARJOT, DIBELLO ([id number]) Exposed Structures: Fat Layer (Subcutaneous N/A N/A Tissue) Exposed: Yes Muscle: Yes Fascia: No Tendon: No Joint: No Bone: No Epithelialization: None N/A N/A Periwound Skin Texture: Excoriation: No N/A N/A Induration: No Callus: No Crepitus: No Rash: No Scarring: No Periwound Skin Moisture: Maceration: No N/A N/A Dry/Scaly: No Periwound Skin Color: Atrophie Blanche: No N/A N/A Cyanosis: No Ecchymosis: No Erythema: No Hemosiderin Staining: No Mottled: No Pallor: No Rubor: No Temperature: No Abnormality N/A N/A Tenderness on Palpation: No N/A N/A Wound Preparation: Ulcer Cleansing: N/A N/A Rinsed/Irrigated with Saline Topical Anesthetic Applied: Other: lidocaine 4% Treatment Notes Wound #1 (Midline Sacrum) Notes silvercel, bordered foam dressing Electronic Signature(s) Signed: 05/03/2018 5:05:37 PM By: Baltazar Najjar MD Entered By: Baltazar Najjar on 05/03/2018 15:00:43 [name] ([id number]) -------------------------------------------------------------------------------- Multi-Disciplinary Care Plan Details Patient Name: [name] Date of Service: 05/03/2018 1:45 PM Medical  Record Number: [id number] Patient Account Number: [id number] Date of Birth/Sex: 03/08/1955 (63 y.o. M) Treating RN: Huel Coventry Primary Care Keilani Terrance: Karie Fetch Other Clinician: Referring Serra Younan: Karie Fetch Treating Sienna Stonehocker/Extender: Altamese [place] in Treatment: 5 Active Inactive Necrotic Tissue Nursing Diagnoses: Impaired tissue integrity related to necrotic/devitalized tissue Goals: Necrotic/devitalized tissue will be minimized in the wound bed Date Initiated: 03/29/2018 Target Resolution Date: 03/29/2018 Goal Status: Active Interventions: Assess patient pain level pre-, during and post procedure and prior to discharge Treatment Activities: Apply topical anesthetic as ordered : 03/29/2018 Excisional debridement : 03/29/2018 Notes: Orientation to the Wound Care Program Nursing Diagnoses: Knowledge deficit related to the wound healing center program Goals: Patient/caregiver will verbalize understanding of the Wound Healing Center Program Date Initiated: 03/29/2018 Target Resolution Date: 03/29/2018 Goal Status: Active Interventions: Provide education on orientation to the wound center Notes: Pressure Nursing Diagnoses: Knowledge deficit related to management of pressures ulcers Potential for impaired tissue integrity related to pressure, friction, moisture, and shear Goals: Patient/caregiver will verbalize understanding of pressure ulcer management FREDDY, KINNE ([id number]) Date Initiated: 03/29/2018 Target Resolution Date: 03/29/2018 Goal Status: Active Interventions: Assess: immobility, friction, shearing, incontinence upon admission and as needed Provide education on pressure ulcers Treatment Activities: Patient referred for home evaluation of offloading devices/mattresses : 03/29/2018 Notes: Soft Tissue Infection Nursing Diagnoses: Impaired tissue integrity Potential for infection: soft tissue Goals: Patient will remain free of wound  infection Date Initiated: 03/29/2018 Target Resolution Date: 03/29/2018 Goal Status: Active Signs and symptoms of infection will be recognized early to allow for prompt treatment Date Initiated: 03/29/2018 Target Resolution Date: 03/29/2018 Goal Status: Active Interventions: Assess signs and symptoms of infection every visit Treatment Activities: Culture and sensitivity : 03/29/2018 Notes: Wound/Skin Impairment Nursing Diagnoses: Impaired tissue integrity Goals: Patient/caregiver will verbalize understanding of skin care regimen Date Initiated: 03/29/2018 Target Resolution Date: 03/29/2018 Goal Status: Active Ulcer/skin breakdown will have a volume reduction of 30% by week 4 Date Initiated: 03/29/2018 Target Resolution Date: 03/29/2018 Goal Status: Active Interventions: Assess ulceration(s) every visit Treatment Activities: Patient referred to home care : 03/29/2018 Skin care regimen initiated : 03/29/2018 SIMCHA, SPEIR ([id number]) Notes: Electronic Signature(s) Signed: 05/03/2018 5:29:19 PM By: Elliot Gurney, BSN, RN, CWS, Kim RN, BSN Entered By: Elliot Gurney, BSN, RN, CWS, Kim on 05/03/2018 14:26:58 [name] ([id number]) -------------------------------------------------------------------------------- Pain Assessment Details Patient Name: [name] Date of Service: 05/03/2018 1:45 PM  Medical Record Number: [id number] Patient Account Number: [id number] Date of Birth/Sex: [birth year] (63 y.o. M) Treating RN: Arnette Norris Primary Care Creedon Danielski: Karie Fetch Other Clinician: Referring Melika Reder: Karie Fetch Treating Annaliesa Blann/Extender: Altamese [place] in Treatment: 5 Active Problems Location of Pain Severity and Description of Pain Patient Has Paino No Site Locations Pain Management and Medication Current Pain Management: Electronic Signature(s) Signed: 05/05/2018 3:21:09 PM By: Arnette Norris Entered By: Arnette Norris on 05/03/2018 13:59:51 [name] ([id number]) -------------------------------------------------------------------------------- Patient/Caregiver Education Details Patient Name: [name] Date of Service: 05/03/2018 1:45 PM Medical Record Number: [id number] Patient Account Number: [id number] Date of Birth/Gender: [birth year] (63 y.o. M) Treating RN: Huel Coventry Primary Care Physician: Karie Fetch Other Clinician: Referring Physician: Karie Fetch Treating Physician/Extender: Altamese [place] in Treatment: 5 Education Assessment Education Provided To: Patient Education Topics Provided Pressure: Handouts: Preventing Pressure Ulcers Methods: Demonstration, Explain/Verbal Responses: State content correctly Wound/Skin Impairment: Handouts: Caring for Your Ulcer, Other: coontinue wound care as prescribed Methods: Demonstration, Explain/Verbal Responses: State content correctly Electronic Signature(s) Signed: 05/03/2018 5:29:19 PM By: Elliot Gurney, BSN, RN, CWS, Kim RN, BSN Entered By: Elliot Gurney, BSN, RN, CWS, Kim on 05/03/2018 14:29:08 [name] ([id number]) -------------------------------------------------------------------------------- Wound Assessment Details Patient Name: [name] Date of Service: 05/03/2018 1:45 PM Medical Record Number: [id number] Patient Account Number: [id number] Date of Birth/Sex: [birth year] (62 y.o. M) Treating RN: Arnette Norris Primary Care Kymberlyn Eckford: Karie Fetch Other Clinician: Referring Oliana Gowens: Karie Fetch Treating [place] Freyre/Extender: Altamese [place] in Treatment: 5 Wound Status Wound Number: 1 Primary Pressure Ulcer Etiology: Wound Location: Sacrum - Midline Wound Open Wounding Event: Gradually Appeared Status: Date Acquired: 03/08/2018 Comorbid Arrhythmia, Deep Vein Thrombosis, Weeks Of Treatment: 5 History: Hypertension, History of pressure wounds, Clustered Wound: No Paraplegia Photos Wound Measurements Length: (cm) 1.7 %  Reduction in Width: (cm) 2.5 % Reduction in Depth: (cm) 1.5 Epithelializati Area: (cm) 3.338 Tunneling: Volume: (cm) 5.007 Position (o Maximum Dist Area: 57.1% Volume: 41.5% on: None Yes 'clock): 12 ance: (cm) 2.2 Undermining: No Wound Description Classification: Category/Stage IV Foul Odor After Wound Margin: Flat and Intact Due to Product Exudate Amount: Large Slough/Fibrino Exudate Type: Serosanguineous Exudate Color: red, brown Cleansing: Yes Use: No Yes Wound Bed Granulation Amount: None Present (0%) Exposed Structure Necrotic Amount: Medium (34-66%) Fascia Exposed: No Necrotic Quality: Adherent Slough Fat Layer (Subcutaneous Tissue) Exposed: Yes Tendon Exposed: No Muscle Exposed: Yes Necrosis of Muscle: No Joint Exposed: No Buttermore, Herberto N. ([id number]) Bone Exposed: No Periwound Skin Texture Texture Color No Abnormalities Noted: No No Abnormalities Noted: No Callus: No Atrophie Blanche: No Crepitus: No Cyanosis: No Excoriation: No Ecchymosis: No Induration: No Erythema: No Rash: No Hemosiderin Staining: No Scarring: No Mottled: No Pallor: No Moisture Rubor: No No Abnormalities Noted: No Dry / Scaly: No Temperature / Pain Maceration: No Temperature: No Abnormality Wound Preparation Ulcer Cleansing: Rinsed/Irrigated with Saline Topical Anesthetic Applied: Other: lidocaine 4%, Treatment Notes Wound #1 (Midline Sacrum) Notes silvercel, bordered foam dressing Electronic Signature(s) Signed: 05/03/2018 5:29:19 PM By: Elliot Gurney, BSN, RN, CWS, Kim RN, BSN Signed: 05/05/2018 3:21:09 PM By: Arnette Norris Entered By: Elliot Gurney BSN, RN, CWS, Kim on 05/03/2018 14:26:42 IMIR, BUTZ ([id number]) -------------------------------------------------------------------------------- Vitals Details Patient Name: [name] Date of Service: 05/03/2018 1:45 PM Medical Record Number: [id number] Patient Account Number: [id number] Date of Birth/Sex:  Mar 03, 1955 (63 y.o. M) Treating RN: Arnette Norris Primary Care Anijah Spohr: Karie Fetch Other Clinician: Referring Yoltzin Barg: Karie Fetch Treating Chelsey Redondo/Extender: Altamese [place] in Treatment: 5 Vital  Signs Time Taken: 14:00 Temperature (F): 97.6 Height (in): 70 Pulse (bpm): 79 Weight (lbs): 140 Respiratory Rate (breaths/min): 18 Body Mass Index (BMI): 20.1 Blood Pressure (mmHg): 118/71 Reference Range: 80 - 120 mg / dl Electronic Signature(s) Signed: 05/05/2018 3:21:09 PM By: Arnette Norris Entered By: Arnette Norris on 05/03/2018 14:00:17

## 2018-05-10 ENCOUNTER — Encounter: Payer: Medicaid Other | Admitting: Internal Medicine

## 2018-05-10 ENCOUNTER — Other Ambulatory Visit: Payer: Self-pay

## 2018-05-10 DIAGNOSIS — L89154 Pressure ulcer of sacral region, stage 4: Secondary | ICD-10-CM | POA: Diagnosis not present

## 2018-05-11 NOTE — Progress Notes (Signed)
Mitchell Dixon, Mitchell N. ([id number]) Visit Report for 05/10/2018 HPI Details Patient Name: Mitchell Dixon, Mitchell N. Date of Service: 05/10/2018 3:30 PM Medical Record Number: [id number] Patient Account Number: [id number] Date of Birth/Sex: April 12, 1955 (63 y.o. 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 [facility]podiatry for wounds on his bilateral feet. He has home health. We did not look at these today. Over the last 3 Dixon they noticed a small area on his sacrum which has matured into a fairly substantial pressure ulcer. He is here for our review of this. 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 He also required an embolectomy and fasciectomy of the right leg and he currently has wounds on the right heel and left ankle but as stated [facility]. 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 X-ray we did last week did not show evidence of acute osteomyelitis in the sacrum. The patient has a pacemaker and will need a CT scan of this area to exclude osteomyelitis. Swab culture I did showed a few Enterococcus faecalis and a few enterococcus avium I  started him on Augmentin 875 twice daily for 10 days on 2/3 2/12; patient is still on Augmentin but he should finish up before I see him next week. We have had some trouble with Medicaid and CT scan authorization. The wound is probably actually on S4-S5 so we will ask for a CT scan of this area rather than the in the pelvis. We have been using silver alginate as the primary dressing in the wound is cleaning up quite nicely 2/19; 2/19; the patient has completed antibiotics. His CT scan is booked for 2/25; we have been using silver alginate. 2/26; the patient had a CT scan done. Unfortunately they did not even comment on the area of the lower sacrum that we wanted them to look at. However they discovered an incidentally very large complex mass anterior to the left hip and left femur measuring 13.4 x 7.3 x 5.1. This had the appearance of a possible bone or soft tissue malignancy. They recommended an MRI but he has a pacemaker and I think that would preclude that. I have sent him for review by orthopedics. I suspect they will need to biopsy. Wound is about the same size. Some serosanguineous drainage we have been using silver alginate 3/4; using silver alginate. Culture I did last week showed few MSSA and Corynebacterium. I put him on Keflex. We are attempting to arrange an orthopedic consult 3/11; using silver alginate. We are making nice progress in the wound surface. There is still undermining around the granulation tissue. He is completing the Keflex that I put him on 2 Dixon ago. He sees orthopedics on  Friday this week with regards to the incidentally discovered mass in his left hip and femur Electronic Signature(s) Signed: 05/10/2018 5:42:07 PM By: Mitchell Najjar MD Entered By: Mitchell Dixon on 05/10/2018 17:37:25 [name], Mitchell Dixon ([id number]) Mitchell Dixon ([id number]) -------------------------------------------------------------------------------- Physical Exam Details Patient  Name: Mitchell Dixon Date of Service: 05/10/2018 3:30 PM Medical Record Number: [id number] Patient Account Number: [id number] Date of Birth/Sex: 07-Aug-1955 (63 y.o. M) Treating RN: Mitchell Dixon Primary Care Provider: Karie Fetch Other Clinician: Referring Provider: Karie Fetch Treating Provider/Extender: Mitchell [place] in Treatment: 6 Constitutional Sitting or standing Blood Pressure is within target range for patient.. Pulse regular and within target range for patient.Marland Kitchen Respirations regular, non-labored and within target range.. Temperature is normal and within the target range for the patient.Marland Kitchen appears in no distress. Respiratory Respiratory effort is easy and symmetric bilaterally. Rate is normal at rest and on room air.. . Cardiovascular Appears euvolemic. Integumentary (Hair, Skin) There is no evidence of an infection around the wound. No purulent drainage is seen. Psychiatric No evidence of depression, anxiety, or agitation. Calm, cooperative, and communicative. Appropriate interactions and affect.. Notes Wound exam; the patient appears to be making nice progress. Somewhat smaller dimensions. There is no tunneling area from 12-2 o'clock this is an improvement although around the granulation there is still undermining. Electronic Signature(s) Signed: 05/10/2018 5:42:07 PM By: Mitchell Najjar MD Entered By: Mitchell Dixon on 05/10/2018 17:39:25 Mitchell Dixon ([id number]) -------------------------------------------------------------------------------- Physician Orders Details Patient Name: Mitchell Dixon Date of Service: 05/10/2018 3:30 PM Medical Record Number: [id number] Patient Account Number: [id number] Date of Birth/Sex: Feb 09, 1956 (62 y.o. M) Treating RN: Mitchell Dixon Primary Care Provider: Karie Fetch Other Clinician: Referring Provider: Karie Fetch Treating Provider/Extender: Mitchell [place] in Treatment: 6 Verbal / Phone Orders:  No Diagnosis Coding Wound Cleansing Wound #1 Midline Sacrum o Clean wound with Normal Saline. Anesthetic (add to Medication List) Wound #1 Midline Sacrum o Topical Lidocaine 4% cream applied to wound bed prior to debridement (In Clinic Only). Primary Wound Dressing Wound #1 Midline Sacrum o Silver Alginate Secondary Dressing Wound #1 Midline Sacrum o Boardered Foam Dressing Dressing Change Frequency Wound #1 Midline Sacrum o Change Dressing Monday, Wednesday, Friday Follow-up Appointments Wound #1 Midline Sacrum o Return Appointment in 2 Dixon. Off-Loading Wound #1 Midline Sacrum o Turn and reposition every 2 hours Home Health Wound #1 Midline Sacrum o Continue Home Health Visits - [facility] o Home Health Nurse may visit PRN to address patientos wound care needs. o FACE TO FACE ENCOUNTER: MEDICARE and MEDICAID PATIENTS: I certify that this patient is under my care and that I had a face-to-face encounter that meets the physician face-to-face encounter requirements with this patient on this date. The encounter with the patient was in whole or in part for the following MEDICAL CONDITION: (primary reason for Home Healthcare) MEDICAL NECESSITY: I certify, that based on my findings, NURSING services are a medically necessary home health service. HOME BOUND STATUS: I certify that my clinical findings support that this patient is homebound (i.e., Due to illness or injury, pt requires aid of supportive devices such as crutches, cane, wheelchairs, walkers, the use of special transportation or the assistance of another person to leave their place of residence. There is a normal inability to leave the home and doing so requires considerable and taxing effort. Other absences are for medical reasons / religious services and are infrequent or of short duration when for other reasons). MITUL, HALLOWELL ([id number]) o  If current dressing causes regression in  wound condition, may D/C ordered dressing product/s and apply Normal Saline Moist Dressing daily until next Wound Healing Center / Other MD appointment. Notify Wound Healing Center of regression in wound condition at [phone]. o Please direct any NON-WOUND related issues/requests for orders to patient's Primary Care Physician Electronic Signature(s) Signed: 05/10/2018 5:23:21 PM By: Elliot Gurney, BSN, RN, CWS, Kim RN, BSN Signed: 05/10/2018 5:42:07 PM By: Mitchell Najjar MD Entered By: Elliot Gurney, BSN, RN, CWS, Mitchell on 05/10/2018 16:04:12 JERMIAH, LAMPING ([id number]) -------------------------------------------------------------------------------- Problem List Details Patient Name: Mitchell Dixon Date of Service: 05/10/2018 3:30 PM Medical Record Number: [id number] Patient Account Number: [id number] Date of Birth/Sex: Jul 16, 1955 (63 y.o. M) Treating RN: Mitchell Dixon Primary Care Provider: Karie Fetch Other Clinician: Referring Provider: Karie Fetch Treating Provider/Extender: Mitchell [place] in Treatment: 6 Active Problems ICD-10 Evaluated Encounter Code Description Active Date Today Diagnosis L89.154 Pressure ulcer of sacral region, stage 4 03/29/2018 No Yes G82.21 Paraplegia, complete 03/29/2018 No Yes Inactive Problems Resolved Problems ICD-10 Code Description Active Date Resolved Date L89.312 Pressure ulcer of right buttock, stage 2 03/29/2018 03/29/2018 Electronic Signature(s) Signed: 05/10/2018 5:42:07 PM By: Mitchell Najjar MD Entered By: Mitchell Dixon on 05/10/2018 17:36:25 Mitchell Dixon ([id number]) -------------------------------------------------------------------------------- Progress Note Details Patient Name: Mitchell Dixon Date of Service: 05/10/2018 3:30 PM Medical Record Number: [id number] Patient Account Number: [id number] Date of Birth/Sex: March 19, 1955 (62 y.o. 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 He has been followed at the [facility] wound care clinic/podiatry for wounds on his bilateral feet. He has home health. We did not look at these today. Over the last 3 Dixon they noticed a small area on his sacrum which has matured into a fairly substantial pressure ulcer. He is here for our review of this. 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 He also required an embolectomy and fasciectomy of the right leg and he currently has wounds on the right heel and left ankle but as stated these are being followed at [facility]. 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 X-ray we did last week did not show evidence of acute osteomyelitis in the sacrum. The patient has a pacemaker and will need a CT scan of this area to exclude osteomyelitis. Swab culture I did showed a few Enterococcus faecalis and a few enterococcus avium I started him on Augmentin 875 twice daily for 10 days on 2/3 2/12; patient is still on Augmentin but he should finish up before I see him next week. We have had some trouble with Medicaid and CT scan authorization. The wound is probably actually on S4-S5 so we will ask for a CT scan of this area rather than the in the  pelvis. We have  been using silver alginate as the primary dressing in the wound is cleaning up quite nicely 2/19; 2/19; the patient has completed antibiotics. His CT scan is booked for 2/25; we have been using silver alginate. 2/26; the patient had a CT scan done. Unfortunately they did not even comment on the area of the lower sacrum that we wanted them to look at. However they discovered an incidentally very large complex mass anterior to the left hip and left femur measuring 13.4 x 7.3 x 5.1. This had the appearance of a possible bone or soft tissue malignancy. They recommended an MRI but he has a pacemaker and I think that would preclude that. I have sent him for review by orthopedics. I suspect they will need to biopsy. Wound is about the same size. Some serosanguineous drainage we have been using silver alginate 3/4; using silver alginate. Culture I did last week showed few MSSA and Corynebacterium. I put him on Keflex. We are attempting to arrange an orthopedic consult 3/11; using silver alginate. We are making nice progress in the wound surface. There is still undermining around the granulation tissue. He is completing the Keflex that I put him on 2 Dixon ago. He sees orthopedics on Friday this week with regards to the incidentally discovered mass in his left hip and femur Lague, Mcguire N. ([id number]) Objective Constitutional Sitting or standing Blood Pressure is within target range for patient.. Pulse regular and within target range for patient.Marland Kitchen Respirations regular, non-labored and within target range.. Temperature is normal and within the target range for the patient.Marland Kitchen appears in no distress. Vitals Time Taken: 3:39 PM, Height: 70 in, Weight: 140 lbs, BMI: 20.1, Temperature: 97.7 F, Pulse: 79 bpm, Respiratory Rate: 18 breaths/min, Blood Pressure: 126/82 mmHg. Respiratory Respiratory effort is easy and symmetric bilaterally. Rate is normal at rest and on room  air.. Cardiovascular Appears euvolemic. Psychiatric No evidence of depression, anxiety, or agitation. Calm, cooperative, and communicative. Appropriate interactions and affect.. General Notes: Wound exam; the patient appears to be making nice progress. Somewhat smaller dimensions. There is no tunneling area from 12-2 o'clock this is an improvement although around the granulation there is still undermining. Integumentary (Hair, Skin) There is no evidence of an infection around the wound. No purulent drainage is seen. Wound #1 status is Open. Original cause of wound was Gradually Appeared. The wound is located on the Midline Sacrum. The wound measures 1.5cm length x 2cm width x 1cm depth; 2.356cm^2 area and 2.356cm^3 volume. There is muscle and Fat Layer (Subcutaneous Tissue) Exposed exposed. There is no undermining noted, however, there is tunneling at 12:00 with a maximum distance of 1.2cm. There is a large amount of serosanguineous drainage noted. Foul odor after cleansing was noted. The wound margin is flat and intact. There is no granulation within the wound bed. There is a medium (34-66%) amount of necrotic tissue within the wound bed including Adherent Slough. The periwound skin appearance did not exhibit: Callus, Crepitus, Excoriation, Induration, Rash, Scarring, Dry/Scaly, Maceration, Atrophie Blanche, Cyanosis, Ecchymosis, Hemosiderin Staining, Mottled, Pallor, Rubor, Erythema. Periwound temperature was noted as No Abnormality. Assessment Active Problems ICD-10 Pressure ulcer of sacral region, stage 4 Paraplegia, complete Plan Wound Cleansing: Wound #1 Midline Sacrum: Clean wound with Normal Saline. GONZALO, WIMSATT ([id number]) Anesthetic (add to Medication List): Wound #1 Midline Sacrum: Topical Lidocaine 4% cream applied to wound bed prior to debridement (In Clinic Only). Primary Wound Dressing: Wound #1 Midline Sacrum: Silver Alginate Secondary Dressing: Wound #1  Midline Sacrum: Boardered  Foam Dressing Dressing Change Frequency: Wound #1 Midline Sacrum: Change Dressing Monday, Wednesday, Friday Follow-up Appointments: Wound #1 Midline Sacrum: Return Appointment in 2 Dixon. Off-Loading: Wound #1 Midline Sacrum: Turn and reposition every 2 hours Home Health: Wound #1 Midline Sacrum: Continue [facility] Health Visits - [facility] Nurse may visit PRN to address patient s wound care needs. FACE TO FACE ENCOUNTER: MEDICARE and MEDICAID PATIENTS: I certify that this patient is under my care and that I had a face-to-face encounter that meets the physician face-to-face encounter requirements with this patient on this date. The encounter with the patient was in whole or in part for the following MEDICAL CONDITION: (primary reason for Home Healthcare) MEDICAL NECESSITY: I certify, that based on my findings, NURSING services are a medically necessary home health service. HOME BOUND STATUS: I certify that my clinical findings support that this patient is homebound (i.e., Due to illness or injury, pt requires aid of supportive devices such as crutches, cane, wheelchairs, walkers, the use of special transportation or the assistance of another person to leave their place of residence. There is a normal inability to leave the home and doing so requires considerable and taxing effort. Other absences are for medical reasons / religious services and are infrequent or of short duration when for other reasons). If current dressing causes regression in wound condition, may D/C ordered dressing product/s and apply Normal Saline Moist Dressing daily until next Wound Healing Center / Other MD appointment. Notify Wound Healing Center of regression in wound condition at [phone]. Please direct any NON-WOUND related issues/requests for orders to patient's Primary Care Physician 1. Silver alginate to continue 2. There meticulously offloading this area which  I think is really helped making a good improvement in this wound 3. He has orthopedic follow-up 4. He can complete his antibiotics which is cephalexin. I had originally done the CT scan in order to look at the area under the wound however the radiologist did not really comment on this focusing on the incidentally discovered mass in the left hip/femur area for which she is seeing orthopedics on Friday. I am not planning to add any more antibiotics at present but will continue to monitor this area. He is not a candidate for an MRI Electronic Signature(s) Signed: 05/10/2018 5:42:07 PM By: Mitchell Najjar MD Entered By: Mitchell Dixon on 05/10/2018 17:40:50

## 2018-05-13 NOTE — Progress Notes (Signed)
[name], [name] ([id number]) Visit Report for 05/10/2018 Arrival Information Details Patient Name: Mitchell Dixon, Mitchell Dixon Date of Service: 05/10/2018 3:30 PM Medical Record Number: [id number] Patient Account Number: [id number] Date of Birth/Sex: 1955-07-22 (63 y.o. M) Treating RN: Mitchell Dixon Primary Care Mitchell Dixon: Mitchell Dixon Other Clinician: Referring Mitchell Dixon: Mitchell Dixon Treating Mitchell Dixon/Extender: Mitchell Dixon in Treatment: 6 Visit Information History Since Last Visit Added or deleted any medications: No Patient Arrived: Wheel Chair Any new allergies or adverse reactions: No Arrival Time: 15:38 Had a fall or experienced change in No Accompanied By: brother activities of daily living that may affect Transfer Assistance: Manual risk of falls: Patient Identification Verified: Yes Signs or symptoms of abuse/neglect since last visito No Secondary Verification Process Completed: Yes Hospitalized since last visit: No Implantable device outside of the clinic excluding No cellular tissue based products placed in the center since last visit: Has Dressing in Place as Prescribed: Yes Pain Present Now: No Electronic Signature(s) Signed: 05/10/2018 4:35:00 PM By: Mitchell Dixon RCP, RRT, CHT Entered By: Mitchell Dixon on 05/10/2018 15:39:21 Mitchell Dixon ([id number]) -------------------------------------------------------------------------------- Clinic Level of Care Assessment Details Patient Name: Mitchell Dixon Date of Service: 05/10/2018 3:30 PM Medical Record Number: [id number] Patient Account Number: [id number] Date of Birth/Sex: 10-Jun-1955 (62 y.o. M) Treating RN: Mitchell Dixon Primary Care Mitchell Dixon: Mitchell Dixon Other Clinician: Referring Mitchell Dixon: Mitchell Dixon Treating Mitchell Dixon/Extender: Mitchell Dixon in Treatment: 6 Clinic Level of Care Assessment Items TOOL 4 Quantity Score []  - Use when only an EandM is performed  on FOLLOW-UP visit 0 ASSESSMENTS - Nursing Assessment / Reassessment []  - Reassessment of Co-morbidities (includes updates in patient status) 0 X- 1 5 Reassessment of Adherence to Treatment Plan ASSESSMENTS - Wound and Skin Assessment / Reassessment X - Simple Wound Assessment / Reassessment - one wound 1 5 []  - 0 Complex Wound Assessment / Reassessment - multiple wounds []  - 0 Dermatologic / Skin Assessment (not related to wound area) ASSESSMENTS - Focused Assessment []  - Circumferential Edema Measurements - multi extremities 0 []  - 0 Nutritional Assessment / Counseling / Intervention []  - 0 Lower Extremity Assessment (monofilament, tuning fork, pulses) []  - 0 Peripheral Arterial Disease Assessment (using hand held doppler) ASSESSMENTS - Ostomy and/or Continence Assessment and Care []  - Incontinence Assessment and Management 0 []  - 0 Ostomy Care Assessment and Management (repouching, etc.) PROCESS - Coordination of Care X - Simple Patient / Family Education for ongoing care 1 15 []  - 0 Complex (extensive) Patient / Family Education for ongoing care X- 1 10 Staff obtains Chiropractor, Records, Test Results / Process Orders []  - 0 Staff telephones HHA, Nursing Homes / Clarify orders / etc []  - 0 Routine Transfer to another Facility (non-emergent condition) []  - 0 Routine Hospital Admission (non-emergent condition) []  - 0 New Admissions / Manufacturing engineer / Ordering NPWT, Apligraf, etc. []  - 0 Emergency Hospital Admission (emergent condition) X- 1 10 Simple Discharge Coordination Mitchell Dixon ([id number]) []  - 0 Complex (extensive) Discharge Coordination PROCESS - Special Needs []  - Pediatric / Minor Patient Management 0 []  - 0 Isolation Patient Management []  - 0 Hearing / Language / Visual special needs []  - 0 Assessment of Community assistance (transportation, D/C planning, etc.) []  - 0 Additional assistance / Altered mentation []  - 0 Support  Surface(s) Assessment (bed, cushion, seat, etc.) INTERVENTIONS - Wound Cleansing / Measurement X - Simple Wound Cleansing - one wound 1 5 []  - 0 Complex Wound Cleansing - multiple wounds  X- 1 5 Wound Imaging (photographs - any number of wounds) []  - 0 Wound Tracing (instead of photographs) X- 1 5 Simple Wound Measurement - one wound []  - 0 Complex Wound Measurement - multiple wounds INTERVENTIONS - Wound Dressings []  - Small Wound Dressing one or multiple wounds 0 X- 1 15 Medium Wound Dressing one or multiple wounds []  - 0 Large Wound Dressing one or multiple wounds []  - 0 Application of Medications - topical []  - 0 Application of Medications - injection INTERVENTIONS - Miscellaneous []  - External ear exam 0 []  - 0 Specimen Collection (cultures, biopsies, blood, body fluids, etc.) []  - 0 Specimen(s) / Culture(s) sent or taken to Lab for analysis []  - 0 Patient Transfer (multiple staff / Nurse, adult / Similar devices) []  - 0 Simple Staple / Suture removal (25 or less) []  - 0 Complex Staple / Suture removal (26 or more) []  - 0 Hypo / Hyperglycemic Management (close monitor of Blood Glucose) []  - 0 Ankle / Brachial Index (ABI) - do not check if billed separately X- 1 5 Vital Signs [name], [name] ([id number]) Has the patient been seen at the hospital within the last three years: Yes Total Score: 80 Level Of Care: New/Established - Level 3 Electronic Signature(s) Signed: 05/10/2018 5:23:21 PM By: Mitchell Dixon, BSN, RN, CWS, Kim RN, BSN Entered By: Mitchell Dixon, BSN, RN, CWS, Kim on 05/10/2018 16:04:35 Mitchell Dixon ([id number]) -------------------------------------------------------------------------------- Encounter Discharge Information Details Patient Name: Mitchell Dixon Date of Service: 05/10/2018 3:30 PM Medical Record Number: [id number] Patient Account Number: [id number] Date of Birth/Sex: 06/22/1955 (62 y.o. M) Treating RN: Mitchell Dixon Primary Care Mitchell Dixon:  Mitchell Dixon Other Clinician: Referring Mitchell Dixon: Mitchell Dixon Treating Mitchell Dixon/Extender: Mitchell [place] in Treatment: 6 Encounter Discharge Information Items Discharge Condition: Stable Ambulatory Status: Wheelchair Discharge Destination: Home Transportation: Private Auto Accompanied By: brother Schedule Follow-up Appointment: Yes Clinical Summary of Care: Electronic Signature(s) Signed: 05/12/2018 3:17:15 PM By: Mitchell Dixon Entered By: Mitchell Dixon on 05/10/2018 16:10:31 Mitchell Dixon ([id number]) -------------------------------------------------------------------------------- Lower Extremity Assessment Details Patient Name: Mitchell Dixon Date of Service: 05/10/2018 3:30 PM Medical Record Number: [id number] Patient Account Number: [id number] Date of Birth/Sex: 1955-09-08 (62 y.o. M) Treating RN: Arnette Norris Primary Care Merinda Victorino: Mitchell Dixon Other Clinician: Referring Cade Dashner: Mitchell Dixon Treating Amberly Livas/Extender: Maxwell Caul Weeks in Treatment: 6 Electronic Signature(s) Signed: 05/12/2018 8:23:32 AM By: Arnette Norris Entered By: Arnette Norris on 05/10/2018 15:56:27 Mitchell Dixon ([id number]) -------------------------------------------------------------------------------- Multi Wound Chart Details Patient Name: Mitchell Dixon Date of Service: 05/10/2018 3:30 PM Medical Record Number: [id number] Patient Account Number: [id number] Date of Birth/Sex: 05-06-1955 (64 y.o. M) Treating RN: Mitchell Dixon Primary Care Ashlee Bewley: Mitchell Dixon Other Clinician: Referring Daris Aristizabal: Mitchell Dixon Treating Laini Urick/Extender: Mitchell [place] in Treatment: 6 Vital Signs Height(in): 70 Pulse(bpm): 79 Weight(lbs): 140 Blood Pressure(mmHg): 126/82 Body Mass Index(BMI): 20 Temperature(F): 97.7 Respiratory Rate 18 (breaths/min): Photos: [N/A:N/A] Wound Location: Sacrum - Midline N/A N/A Wounding Event: Gradually Appeared N/A  N/A Primary Etiology: Pressure Ulcer N/A N/A Comorbid History: Arrhythmia, Deep Vein N/A N/A Thrombosis, Hypertension, History of pressure wounds, Paraplegia Date Acquired: 03/08/2018 N/A N/A Weeks of Treatment: 6 N/A N/A Wound Status: Open N/A N/A Measurements L x W x D 1.5x2x1 N/A N/A (cm) Area (cm) : 2.356 N/A N/A Volume (cm) : 2.356 N/A N/A % Reduction in Area: 69.70% N/A N/A % Reduction in Volume: 72.50% N/A N/A Position 1 (o'clock): 12 Maximum Distance 1 (cm): 1.2 Tunneling: Yes N/A N/A Classification: Category/Stage  IV N/A N/A Exudate Amount: Large N/A N/A Exudate Type: Serosanguineous N/A N/A Exudate Color: red, brown N/A N/A Foul Odor After Cleansing: Yes N/A N/A Odor Anticipated Due to No N/A N/A Product Use: Wound Margin: Flat and Intact N/A N/A Granulation Amount: None Present (0%) N/A N/A Necrotic Amount: Medium (34-66%) N/A N/A Mitchell Dixon, Mitchell Dixon ([id number]) Exposed Structures: Fat Layer (Subcutaneous N/A N/A Tissue) Exposed: Yes Muscle: Yes Fascia: No Tendon: No Joint: No Bone: No Epithelialization: None N/A N/A Periwound Skin Texture: Excoriation: No N/A N/A Induration: No Callus: No Crepitus: No Rash: No Scarring: No Periwound Skin Moisture: Maceration: No N/A N/A Dry/Scaly: No Periwound Skin Color: Atrophie Blanche: No N/A N/A Cyanosis: No Ecchymosis: No Erythema: No Hemosiderin Staining: No Mottled: No Pallor: No Rubor: No Temperature: No Abnormality N/A N/A Tenderness on Palpation: No N/A N/A Wound Preparation: Ulcer Cleansing: N/A N/A Rinsed/Irrigated with Saline Topical Anesthetic Applied: Other: lidocaine 4% Treatment Notes Wound #1 (Midline Sacrum) Notes silvercel, bordered foam dressing Electronic Signature(s) Signed: 05/10/2018 5:42:07 PM By: Baltazar Najjar MD Previous Signature: 05/10/2018 5:23:21 PM Version By: Mitchell Dixon, BSN, RN, CWS, Kim RN, BSN Entered By: Baltazar Najjar on 05/10/2018 17:36:35 Mitchell Dixon  ([id number]) -------------------------------------------------------------------------------- Multi-Disciplinary Care Plan Details Patient Name: Mitchell Dixon Date of Service: 05/10/2018 3:30 PM Medical Record Number: [id number] Patient Account Number: [id number] Date of Birth/Sex: 1956/01/07 (63 y.o. M) Treating RN: Mitchell Dixon Primary Care Burns Timson: Mitchell Dixon Other Clinician: Referring Kaydince Towles: Mitchell Dixon Treating Brandley Aldrete/Extender: Mitchell [place] in Treatment: 6 Active Inactive Necrotic Tissue Nursing Diagnoses: Impaired tissue integrity related to necrotic/devitalized tissue Goals: Necrotic/devitalized tissue will be minimized in the wound bed Date Initiated: 03/29/2018 Target Resolution Date: 03/29/2018 Goal Status: Active Interventions: Assess patient pain level pre-, during and post procedure and prior to discharge Treatment Activities: Apply topical anesthetic as ordered : 03/29/2018 Excisional debridement : 03/29/2018 Notes: Orientation to the Wound Care Program Nursing Diagnoses: Knowledge deficit related to the wound healing center program Goals: Patient/caregiver will verbalize understanding of the Wound Healing Center Program Date Initiated: 03/29/2018 Target Resolution Date: 03/29/2018 Goal Status: Active Interventions: Provide education on orientation to the wound center Notes: Pressure Nursing Diagnoses: Knowledge deficit related to management of pressures ulcers Potential for impaired tissue integrity related to pressure, friction, moisture, and shear Goals: Patient/caregiver will verbalize understanding of pressure ulcer management Mitchell Dixon, Mitchell Dixon ([id number]) Date Initiated: 03/29/2018 Target Resolution Date: 03/29/2018 Goal Status: Active Interventions: Assess: immobility, friction, shearing, incontinence upon admission and as needed Provide education on pressure ulcers Treatment Activities: Patient referred for home evaluation of  offloading devices/mattresses : 03/29/2018 Notes: Soft Tissue Infection Nursing Diagnoses: Impaired tissue integrity Potential for infection: soft tissue Goals: Patient will remain free of wound infection Date Initiated: 03/29/2018 Target Resolution Date: 03/29/2018 Goal Status: Active Signs and symptoms of infection will be recognized early to allow for prompt treatment Date Initiated: 03/29/2018 Target Resolution Date: 03/29/2018 Goal Status: Active Interventions: Assess signs and symptoms of infection every visit Treatment Activities: Culture and sensitivity : 03/29/2018 Notes: Wound/Skin Impairment Nursing Diagnoses: Impaired tissue integrity Goals: Patient/caregiver will verbalize understanding of skin care regimen Date Initiated: 03/29/2018 Target Resolution Date: 03/29/2018 Goal Status: Active Ulcer/skin breakdown will have a volume reduction of 30% by week 4 Date Initiated: 03/29/2018 Target Resolution Date: 03/29/2018 Goal Status: Active Interventions: Assess ulceration(s) every visit Treatment Activities: Patient referred to home care : 03/29/2018 Skin care regimen initiated : 03/29/2018 Mitchell Dixon, Mitchell Dixon ([id number]) Notes: Electronic Signature(s) Signed: 05/10/2018 5:23:21 PM By: Mitchell Dixon, BSN, RN,  CWS, Kim RN, BSN Entered By: Mitchell Dixon, BSN, RN, CWS, Kim on 05/10/2018 16:02:15 Mitchell Dixon, Mitchell Dixon ([id number]) -------------------------------------------------------------------------------- Pain Assessment Details Patient Name: Mitchell Dixon Date of Service: 05/10/2018 3:30 PM Medical Record Number: [id number] Patient Account Number: [id number] Date of Birth/Sex: December 11, 1955 (63 y.o. M) Treating RN: Mitchell Dixon Primary Care Bassy Fetterly: Mitchell Dixon Other Clinician: Referring Kyarah Enamorado: Mitchell Dixon Treating Endora Teresi/Extender: Mitchell [place] in Treatment: 6 Active Problems Location of Pain Severity and Description of Pain Patient Has Paino No Site  Locations Pain Management and Medication Current Pain Management: Electronic Signature(s) Signed: 05/10/2018 4:35:00 PM By: Mitchell Dixon RCP, RRT, CHT Signed: 05/10/2018 5:23:21 PM By: Mitchell Dixon, BSN, RN, CWS, Kim RN, BSN Entered By: Mitchell Dixon on 05/10/2018 15:39:28 Mitchell Dixon ([id number]) -------------------------------------------------------------------------------- Patient/Caregiver Education Details Patient Name: Mitchell Dixon Date of Service: 05/10/2018 3:30 PM Medical Record Number: [id number] Patient Account Number: [id number] Date of Birth/Gender: 03/29/1955 (62 y.o. M) Treating RN: Mitchell Dixon Primary Care Physician: Mitchell Dixon Other Clinician: Referring Physician: Karie Dixon Treating Physician/Extender: Mitchell [place] in Treatment: 6 Education Assessment Education Provided To: Patient Education Topics Provided Pressure: Handouts: Pressure Ulcers: Care and Offloading Methods: Demonstration, Explain/Verbal Responses: State content correctly Electronic Signature(s) Signed: 05/10/2018 5:23:21 PM By: Mitchell Dixon, BSN, RN, CWS, Kim RN, BSN Entered By: Mitchell Dixon, BSN, RN, CWS, Kim on 05/10/2018 16:04:50 Mitchell Dixon ([id number]) -------------------------------------------------------------------------------- Wound Assessment Details Patient Name: Mitchell Dixon Date of Service: 05/10/2018 3:30 PM Medical Record Number: [id number] Patient Account Number: [id number] Date of Birth/Sex: Feb 19, 1956 (62 y.o. M) Treating RN: Arnette Norris Primary Care Gerard Cantara: Mitchell Dixon Other Clinician: Referring Justina Bertini: Mitchell Dixon Treating Antonina Deziel/Extender: Mitchell  in Treatment: 6 Wound Status Wound Number: 1 Primary Pressure Ulcer Etiology: Wound Location: Sacrum - Midline Wound Open Wounding Event: Gradually Appeared Status: Date Acquired: 03/08/2018 Comorbid Arrhythmia, Deep Vein Thrombosis, Weeks Of  Treatment: 6 History: Hypertension, History of pressure wounds, Clustered Wound: No Paraplegia Photos Wound Measurements Length: (cm) 1.5 % Reduction in Width: (cm) 2 % Reduction in Depth: (cm) 1 Epithelializati Area: (cm) 2.356 Tunneling: Volume: (cm) 2.356 Position (o Maximum Dist Area: 69.7% Volume: 72.5% on: None Yes 'clock): 12 ance: (cm) 1.2 Undermining: No Wound Description Classification: Category/Stage IV Foul Odor After Wound Margin: Flat and Intact Due to Product Exudate Amount: Large Slough/Fibrino Exudate Type: Serosanguineous Exudate Color: red, brown Cleansing: Yes Use: No Yes Wound Bed Granulation Amount: None Present (0%) Exposed Structure Necrotic Amount: Medium (34-66%) Fascia Exposed: No Necrotic Quality: Adherent Slough Fat Layer (Subcutaneous Tissue) Exposed: Yes Tendon Exposed: No Muscle Exposed: Yes Necrosis of Muscle: No Joint Exposed: No Mitchell Dixon, Mitchell N. ([id number]) Bone Exposed: No Periwound Skin Texture Texture Color No Abnormalities Noted: No No Abnormalities Noted: No Callus: No Atrophie Blanche: No Crepitus: No Cyanosis: No Excoriation: No Ecchymosis: No Induration: No Erythema: No Rash: No Hemosiderin Staining: No Scarring: No Mottled: No Pallor: No Moisture Rubor: No No Abnormalities Noted: No Dry / Scaly: No Temperature / Pain Maceration: No Temperature: No Abnormality Wound Preparation Ulcer Cleansing: Rinsed/Irrigated with Saline Topical Anesthetic Applied: Other: lidocaine 4%, Treatment Notes Wound #1 (Midline Sacrum) Notes silvercel, bordered foam dressing Electronic Signature(s) Signed: 05/12/2018 8:23:32 AM By: Arnette Norris Entered By: Arnette Norris on 05/10/2018 15:56:15 Mitchell Dixon ([id number]) -------------------------------------------------------------------------------- Vitals Details Patient Name: Mitchell Dixon Date of Service: 05/10/2018 3:30 PM Medical Record  Number: [id number] Patient Account Number: [id number] Date of Birth/Sex: 06/23/1955 (63 y.o. M) Treating RN: Mitchell Dixon Primary Care Laurin Morgenstern: Letta Pate,  NGWE Other Clinician: Referring Torina Ey: Mitchell Dixon Treating Seretha Estabrooks/Extender: Maxwell Caul Weeks in Treatment: 6 Vital Signs Time Taken: 15:39 Temperature (F): 97.7 Height (in): 70 Pulse (bpm): 79 Weight (lbs): 140 Respiratory Rate (breaths/min): 18 Body Mass Index (BMI): 20.1 Blood Pressure (mmHg): 126/82 Reference Range: 80 - 120 mg / dl Electronic Signature(s) Signed: 05/10/2018 4:35:00 PM By: Mitchell Dixon RCP, RRT, CHT Entered By: Mitchell Dixon on 05/10/2018 15:39:58

## 2018-05-24 ENCOUNTER — Other Ambulatory Visit: Payer: Self-pay

## 2018-05-24 ENCOUNTER — Encounter: Payer: Medicaid Other | Admitting: Internal Medicine

## 2018-05-24 DIAGNOSIS — L89154 Pressure ulcer of sacral region, stage 4: Secondary | ICD-10-CM | POA: Diagnosis not present

## 2018-05-25 NOTE — Progress Notes (Signed)
Mitchell Dixon ([id number]) Visit Report for 05/24/2018 Arrival Information Details Patient Name: Mitchell Dixon Date of Service: 05/24/2018 2:15 PM Medical Record Number: [id number] Patient Account Number: [id number] Date of Birth/Sex: 1955-12-10 (63 y.o. M) Treating RN: Curtis Sites Primary Care Chaylee Ehrsam: Karie Fetch Other Clinician: Referring Maddyn Lieurance: Karie Fetch Treating Leaman Abe/Extender: Altamese [place] in Treatment: 8 Visit Information History Since Last Visit Added or deleted any medications: No Patient Arrived: Wheel Chair Any new allergies or adverse reactions: No Arrival Time: 14:26 Had a fall or experienced change in No Accompanied By: self activities of daily living that may affect Transfer Assistance: Transfer Board risk of falls: Patient Identification Verified: Yes Signs or symptoms of abuse/neglect since last visito No Secondary Verification Process Completed: Yes Hospitalized since last visit: No Implantable device outside of the clinic excluding No cellular tissue based products placed in the center since last visit: Has Dressing in Place as Prescribed: Yes Pain Present Now: No Electronic Signature(s) Signed: 05/24/2018 4:40:50 PM By: Curtis Sites Entered By: Curtis Sites on 05/24/2018 14:33:04 Mitchell Dixon ([id number]) -------------------------------------------------------------------------------- Encounter Discharge Information Details Patient Name: Mitchell Dixon Date of Service: 05/24/2018 2:15 PM Medical Record Number: [id number] Patient Account Number: [id number] Date of Birth/Sex: [birth year] (62 y.o. M) Treating RN: Rodell Perna Primary Care Alima Naser: Karie Fetch Other Clinician: Referring Jess Sulak: Karie Fetch Treating Skyy Mcknight/Extender: Altamese [place] in Treatment: 8 Encounter Discharge Information Items Post Procedure Vitals Discharge Condition: Stable Temperature (F): 97.5 Ambulatory Status:  Wheelchair Pulse (bpm): 63 Discharge Destination: Home Respiratory Rate (breaths/min): 16 Transportation: Private Auto Blood Pressure (mmHg): 142/89 Accompanied By: self Schedule Follow-up Appointment: Yes Clinical Summary of Care: Electronic Signature(s) Signed: 05/24/2018 4:25:44 PM By: Rodell Perna Entered By: Rodell Perna on 05/24/2018 15:26:06 Mitchell Dixon ([id number]) -------------------------------------------------------------------------------- Lower Extremity Assessment Details Patient Name: Mitchell Dixon Date of Service: 05/24/2018 2:15 PM Medical Record Number: [id number] Patient Account Number: [id number] Date of Birth/Sex: 05-13-1955 (62 y.o. M) Treating RN: Curtis Sites Primary Care Coren Sagan: Karie Fetch Other Clinician: Referring Mozel Burdett: Karie Fetch Treating Bassam Dresch/Extender: Altamese [place] in Treatment: 8 Electronic Signature(s) Signed: 05/24/2018 4:40:50 PM By: Curtis Sites Entered By: Curtis Sites on 05/24/2018 14:34:10 Mitchell Dixon ([id number]) -------------------------------------------------------------------------------- Multi Wound Chart Details Patient Name: Mitchell Dixon Date of Service: 05/24/2018 2:15 PM Medical Record Number: [id number] Patient Account Number: [id number] Date of Birth/Sex: 02/06/1956 (63 y.o. M) Treating RN: Huel Coventry Primary Care Kanoa Phillippi: Karie Fetch Other Clinician: Referring Isabela Nardelli: Karie Fetch Treating Corey Laski/Extender: Altamese [place] in Treatment: 8 Vital Signs Height(in): 70 Pulse(bpm): 63 Weight(lbs): 140 Blood Pressure(mmHg): 143/89 Body Mass Index(BMI): 20 Temperature(F): 97.5 Respiratory Rate 16 (breaths/min): Photos: [1:No Photos] [N/A:N/A] Wound Location: [1:Sacrum - Midline] [N/A:N/A] Wounding Event: [1:Gradually Appeared] [N/A:N/A] Primary Etiology: [1:Pressure Ulcer] [N/A:N/A] Comorbid History: [1:Arrhythmia, Deep Vein Thrombosis, Hypertension,  History of pressure wounds, Paraplegia] [N/A:N/A] Date Acquired: [1:03/08/2018] [N/A:N/A] Weeks of Treatment: [1:8] [N/A:N/A] Wound Status: [1:Open] [N/A:N/A] Measurements L x W x D [1:1.3x1.6x0.1] [N/A:N/A] (cm) Area (cm) : [1:1.634] [N/A:N/A] Volume (cm) : [1:0.163] [N/A:N/A] % Reduction in Area: [1:79.00%] [N/A:N/A] % Reduction in Volume: [1:98.10%] [N/A:N/A] Classification: [1:Category/Stage IV] [N/A:N/A] Exudate Amount: [1:Large] [N/A:N/A] Exudate Type: [1:Serosanguineous] [N/A:N/A] Exudate Color: [1:red, brown] [N/A:N/A] Foul Odor After Cleansing: [1:Yes] [N/A:N/A] Odor Anticipated Due to [1:No] [N/A:N/A] Product Use: Wound Margin: [1:Flat and Intact] [N/A:N/A] Granulation Amount: [1:Large (67-100%)] [N/A:N/A] Granulation Quality: [1:Red, Hyper-granulation] [N/A:N/A] Necrotic Amount: [1:None Present (0%)] [N/A:N/A] Exposed Structures: [1:Fat Layer (Subcutaneous Tissue) Exposed: Yes Fascia: No Tendon: No Muscle:  No Joint: No Bone: No] [N/A:N/A] Epithelialization: [1:None] [N/A:N/A] Periwound Skin Texture: [N/A:N/A] Excoriation: No Induration: No Callus: No Crepitus: No Rash: No Scarring: No Periwound Skin Moisture: Maceration: No N/A N/A Dry/Scaly: No Periwound Skin Color: Atrophie Blanche: No N/A N/A Cyanosis: No Ecchymosis: No Erythema: No Hemosiderin Staining: No Mottled: No Pallor: No Rubor: No Temperature: No Abnormality N/A N/A Tenderness on Palpation: No N/A N/A Wound Preparation: Ulcer Cleansing: N/A N/A Rinsed/Irrigated with Saline Topical Anesthetic Applied: Other: lidocaine 4% Treatment Notes Electronic Signature(s) Signed: 05/25/2018 9:14:45 AM By: Elliot Gurney, BSN, RN, CWS, Kim RN, BSN Entered By: Elliot Gurney, BSN, RN, CWS, Kim on 05/24/2018 15:19:00 Mitchell Dixon ([id number]) -------------------------------------------------------------------------------- Multi-Disciplinary Care Plan Details Patient Name: Mitchell Dixon Date of Service:  05/24/2018 2:15 PM Medical Record Number: [id number] Patient Account Number: [id number] Date of Birth/Sex: 09-Nov-1955 (62 y.o. M) Treating RN: Huel Coventry Primary Care Canaan Holzer: Karie Fetch Other Clinician: Referring Kamori Barbier: Karie Fetch Treating Teri Legacy/Extender: Altamese [place] in Treatment: 8 Active Inactive Necrotic Tissue Nursing Diagnoses: Impaired tissue integrity related to necrotic/devitalized tissue Goals: Necrotic/devitalized tissue will be minimized in the wound bed Date Initiated: 03/29/2018 Target Resolution Date: 03/29/2018 Goal Status: Active Interventions: Assess patient pain level pre-, during and post procedure and prior to discharge Treatment Activities: Apply topical anesthetic as ordered : 03/29/2018 Excisional debridement : 03/29/2018 Notes: Orientation to the Wound Care Program Nursing Diagnoses: Knowledge deficit related to the wound healing center program Goals: Patient/caregiver will verbalize understanding of the Wound Healing Center Program Date Initiated: 03/29/2018 Target Resolution Date: 03/29/2018 Goal Status: Active Interventions: Provide education on orientation to the wound center Notes: Pressure Nursing Diagnoses: Knowledge deficit related to management of pressures ulcers Potential for impaired tissue integrity related to pressure, friction, moisture, and shear Goals: Patient/caregiver will verbalize understanding of pressure ulcer management CEDRIE, SKAGGS ([id number]) Date Initiated: 03/29/2018 Target Resolution Date: 03/29/2018 Goal Status: Active Interventions: Assess: immobility, friction, shearing, incontinence upon admission and as needed Provide education on pressure ulcers Treatment Activities: Patient referred for home evaluation of offloading devices/mattresses : 03/29/2018 Notes: Soft Tissue Infection Nursing Diagnoses: Impaired tissue integrity Potential for infection: soft tissue Goals: Patient will remain  free of wound infection Date Initiated: 03/29/2018 Target Resolution Date: 03/29/2018 Goal Status: Active Signs and symptoms of infection will be recognized early to allow for prompt treatment Date Initiated: 03/29/2018 Target Resolution Date: 03/29/2018 Goal Status: Active Interventions: Assess signs and symptoms of infection every visit Treatment Activities: Culture and sensitivity : 03/29/2018 Notes: Wound/Skin Impairment Nursing Diagnoses: Impaired tissue integrity Goals: Patient/caregiver will verbalize understanding of skin care regimen Date Initiated: 03/29/2018 Target Resolution Date: 03/29/2018 Goal Status: Active Ulcer/skin breakdown will have a volume reduction of 30% by week 4 Date Initiated: 03/29/2018 Target Resolution Date: 03/29/2018 Goal Status: Active Interventions: Assess ulceration(s) every visit Treatment Activities: Patient referred to home care : 03/29/2018 Skin care regimen initiated : 03/29/2018 YEE, [place] ([id number]) Notes: Electronic Signature(s) Signed: 05/25/2018 9:14:45 AM By: Elliot Gurney, BSN, RN, CWS, Kim RN, BSN Entered By: Elliot Gurney, BSN, RN, CWS, Kim on 05/24/2018 15:18:50 Mitchell Dixon ([id number]) -------------------------------------------------------------------------------- Pain Assessment Details Patient Name: Mitchell Dixon Date of Service: 05/24/2018 2:15 PM Medical Record Number: [id number] Patient Account Number: [id number] Date of Birth/Sex: June 03, 1955 (62 y.o. M) Treating RN: Curtis Sites Primary Care Yazmina Pareja: Karie Fetch Other Clinician: Referring Chari Parmenter: Karie Fetch Treating Katelan Hirt/Extender: Altamese [place] in Treatment: 8 Active Problems Location of Pain Severity and Description of Pain Patient Has Paino No Site Locations Pain Management and  Medication Current Pain Management: Electronic Signature(s) Signed: 05/24/2018 4:40:50 PM By: Curtis Sites Entered By: Curtis Sites on 05/24/2018  14:33:17 Mitchell Dixon ([id number]) -------------------------------------------------------------------------------- Patient/Caregiver Education Details Patient Name: Mitchell Dixon Date of Service: 05/24/2018 2:15 PM Medical Record Number: [id number] Patient Account Number: [id number] Date of Birth/Gender: [birth year] (63 y.o. M) Treating RN: Huel Coventry Primary Care Physician: Karie Fetch Other Clinician: Referring Physician: Karie Fetch Treating Physician/Extender: Altamese [place] in Treatment: 8 Education Assessment Education Provided To: Patient Education Topics Provided Pressure: Handouts: Pressure Ulcers: Care and Offloading Methods: Demonstration, Explain/Verbal Responses: State content correctly Wound/Skin Impairment: Handouts: Caring for Your Ulcer Methods: Demonstration, Explain/Verbal Responses: State content correctly Electronic Signature(s) Signed: 05/25/2018 9:14:45 AM By: Elliot Gurney, BSN, RN, CWS, Kim RN, BSN Entered By: Elliot Gurney, BSN, RN, CWS, Kim on 05/24/2018 15:23:17 Mitchell Dixon ([id number]) -------------------------------------------------------------------------------- Wound Assessment Details Patient Name: Mitchell Dixon Date of Service: 05/24/2018 2:15 PM Medical Record Number: [id number] Patient Account Number: [id number] Date of Birth/Sex: 03-06-1955 (62 y.o. M) Treating RN: Curtis Sites Primary Care Carlesha Seiple: Karie Fetch Other Clinician: Referring Syble Picco: Karie Fetch Treating Katalia Choma/Extender: Altamese [place] in Treatment: 8 Wound Status Wound Number: 1 Primary Pressure Ulcer Etiology: Wound Location: Sacrum - Midline Wound Open Wounding Event: Gradually Appeared Status: Date Acquired: 03/08/2018 Comorbid Arrhythmia, Deep Vein Thrombosis, Weeks Of Treatment: 8 History: Hypertension, History of pressure wounds, Clustered Wound: No Paraplegia Wound Measurements Length: (cm) 1.3 Width: (cm) 1.6 Depth:  (cm) 0.1 Area: (cm) 1.634 Volume: (cm) 0.163 % Reduction in Area: 79% % Reduction in Volume: 98.1% Epithelialization: None Tunneling: No Undermining: No Wound Description Classification: Category/Stage IV Wound Margin: Flat and Intact Exudate Amount: Large Exudate Type: Serosanguineous Exudate Color: red, brown Foul Odor After Cleansing: Yes Due to Product Use: No Slough/Fibrino Yes Wound Bed Granulation Amount: Large (67-100%) Exposed Structure Granulation Quality: Red, Hyper-granulation Fascia Exposed: No Necrotic Amount: None Present (0%) Fat Layer (Subcutaneous Tissue) Exposed: Yes Tendon Exposed: No Muscle Exposed: No Joint Exposed: No Bone Exposed: No Periwound Skin Texture Texture Color No Abnormalities Noted: No No Abnormalities Noted: No Callus: No Atrophie Blanche: No Crepitus: No Cyanosis: No Excoriation: No Ecchymosis: No Induration: No Erythema: No Rash: No Hemosiderin Staining: No Scarring: No Mottled: No Pallor: No Moisture Rubor: No No Abnormalities Noted: No Dry / Scaly: No Temperature / Pain Maceration: No Temperature: No Abnormality Hejl, Taesean N. ([id number]) Wound Preparation Ulcer Cleansing: Rinsed/Irrigated with Saline Topical Anesthetic Applied: Other: lidocaine 4%, Treatment Notes Wound #1 (Midline Sacrum) Notes hydrofera bue, bordered foam dressing Electronic Signature(s) Signed: 05/24/2018 4:40:50 PM By: Curtis Sites Entered By: Curtis Sites on 05/24/2018 14:40:17 Mitchell Dixon ([id number]) -------------------------------------------------------------------------------- Vitals Details Patient Name: Mitchell Dixon Date of Service: 05/24/2018 2:15 PM Medical Record Number: [id number] Patient Account Number: [id number] Date of Birth/Sex: Feb 08, 1956 (63 y.o. M) Treating RN: Curtis Sites Primary Care Almeda Ezra: Karie Fetch Other Clinician: Referring Yahaira Bruski: Karie Fetch Treating Antolin Belsito/Extender:  Altamese [place] in Treatment: 8 Vital Signs Time Taken: 14:33 Temperature (F): 97.5 Height (in): 70 Pulse (bpm): 63 Weight (lbs): 140 Respiratory Rate (breaths/min): 16 Body Mass Index (BMI): 20.1 Blood Pressure (mmHg): 143/89 Reference Range: 80 - 120 mg / dl Electronic Signature(s) Signed: 05/24/2018 4:40:50 PM By: Curtis Sites Entered By: Curtis Sites on 05/24/2018 14:33:46

## 2018-05-25 NOTE — Progress Notes (Signed)
Mitchell Dixon, Mitchell N. ([id number]) Visit Report for 05/24/2018 Debridement Details Patient Name: Mitchell Dixon, Mitchell N. Date of Service: 05/24/2018 2:15 PM Medical Record Number: [id number] Patient Account Number: [id number] Date of Birth/Sex: [birth year] (63 y.o. M) Treating RN: Huel CoventryWoody, Kim Primary Care Provider: Karie FetchAYCOCK, NGWE Other Clinician: Referring Provider: Karie FetchAYCOCK, NGWE Treating Provider/Extender: Altamese CarolinaOBSON,  G Weeks in Treatment: 8 Debridement Performed for Wound #1 Midline Sacrum Assessment: Performed By: Physician Maxwell CaulOBSON,  G, MD Debridement Type: Debridement Level of Consciousness (Pre- Awake and Alert procedure): Pre-procedure Verification/Time Yes - 15:21 Out Taken: Start Time: 15:21 Pain Control: Lidocaine Total Area Debrided (L x W): 1.3 (cm) x 1.6 (cm) = 2.08 (cm) Tissue and other material Viable, Non-Viable, Subcutaneous debrided: Level: Skin/Subcutaneous Tissue Debridement Description: Excisional Instrument: Blade Bleeding: Moderate Hemostasis Achieved: Silver Nitrate End Time: 15:28 Response to Treatment: Procedure was tolerated well Level of Consciousness Awake and Alert (Post-procedure): Post Debridement Measurements of Total Wound Length: (cm) 1.3 Stage: Category/Stage IV Width: (cm) 1.6 Depth: (cm) 0.1 Volume: (cm) 0.163 Character of Wound/Ulcer Post Stable Debridement: Post Procedure Diagnosis Same as Pre-procedure Electronic Signature(s) Signed: 05/24/2018 5:18:51 PM By: Baltazar Najjarobson,  MD Signed: 05/25/2018 9:14:45 AM By: Elliot GurneyWoody, BSN, RN, CWS, Kim RN, BSN Entered By: Baltazar Najjarobson,  on 05/24/2018 17:03:37 Blackwelder, Rachel MouldsICKEY N. ([id number]) -------------------------------------------------------------------------------- HPI Details Patient Name: Mitchell Dixon, Mitchell N. Date of Service: 05/24/2018 2:15 PM Medical Record Number: [id number] Patient Account Number: [id number] Date of Birth/Sex: [birth year] (63 y.o. 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 [facility] for wounds on his bilateral feet. He has home health. We did not look at these today. Over the last 3 weeks they noticed a small area on his sacrum which has matured into a fairly substantial pressure ulcer. He is here for our review of this. 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 He also required an embolectomy and fasciectomy of the right leg and he currently has wounds on the right heel and left ankle but as stated these are being [facility]. 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 X-ray we did last week did not show evidence of acute osteomyelitis in the sacrum. The patient has a pacemaker and will need a CT scan of this area to exclude osteomyelitis. Swab culture I did showed a few Enterococcus faecalis and a few enterococcus avium I started him on Augmentin 875 twice daily for 10 days on 2/3 2/12; patient is still on Augmentin but he should finish up before I see him next week. We have had some trouble with Medicaid and CT scan authorization. The wound is probably actually on S4-S5 so we will ask for a CT  scan of this area rather than the in the pelvis.  We have been using silver alginate as the primary dressing in the wound is cleaning up quite nicely 2/19; 2/19; the patient has completed antibiotics. His CT scan is booked for 2/25; we have been using silver alginate. 2/26; the patient had a CT scan done. Unfortunately they did not even comment on the area of the lower sacrum that we wanted them to look at. However they discovered an incidentally very large complex mass anterior to the left hip and left femur measuring 13.4 x 7.3 x 5.1. This had the appearance of a possible bone or soft tissue malignancy. They recommended an MRI but he has a pacemaker and I think that would preclude that. I have sent him for review by orthopedics. I suspect they will need to biopsy. Wound is about the same size. Some serosanguineous drainage we have been using silver alginate 3/4; using silver alginate. Culture I did last week showed few MSSA and Corynebacterium. I put him on Keflex. We are attempting to arrange an orthopedic consult 3/11; using silver alginate. We are making nice progress in the wound surface. There is still undermining around the granulation tissue. He is completing the Keflex that I put him on 2 weeks ago. He sees orthopedics on Friday this week with regards to the incidentally discovered mass in his left hip and femur 3/25. We have been using silver alginate to the patient's originally stage IV pressure ulcer. Most of this is filled in nicely in fact he is hyper granulated. He went to see orthopedics at my request Dr. Real Cons. 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 He referred him to orthopedic oncologist at [facility] although the patient has not heard about this referral. In fact in this environment it is difficult to get anybody in to see consultants since there is no elective procedures being done. Unfortunately the area was not biopsied Electronic  Signature(s) Mitchell Dixon, Mitchell Dixon ([id number]) Signed: 05/24/2018 5:18:51 PM By: Baltazar Najjar MD Entered By: Baltazar Najjar on 05/24/2018 17:05:24 Mitchell Dixon, Mitchell Dixon ([id number]) -------------------------------------------------------------------------------- Physical Exam Details Patient Name: Mitchell Gear Date of Service: 05/24/2018 2:15 PM Medical Record Number: [id number] Patient Account Number: [id number] Date of Birth/Sex: 04-Jun-1955 (63 y.o. M) Treating RN: Huel Coventry Primary Care Provider: Karie Fetch Other Clinician: Referring Provider: Karie Fetch Treating Provider/Extender: Altamese [place] in Treatment: 8 Constitutional Patient is hypertensive.. Pulse regular and within target range for patient.Marland Kitchen Respirations regular, non-labored and within target range.. Temperature is normal and within the target range for the patient.Marland Kitchen appears in no distress. Respiratory Respiratory effort is easy and symmetric bilaterally. Rate is normal at rest and on room air.Marland Kitchen Psychiatric No evidence of depression, anxiety, or agitation. Calm, cooperative, and communicative. Appropriate interactions and affect.. Notes Wound exam; the area is really making nice progress. There is no tunneling hyper granulation which I think was beyond use of silver nitrate. I therefore remove the hyper granulated area with a #15 scalpel hemostasis with still silver nitrate Electronic Signature(s) Signed: 05/24/2018 5:18:51 PM By: Baltazar Najjar MD Entered By: Baltazar Najjar on 05/24/2018 17:06:27 Mitchell Gear ([id number]) -------------------------------------------------------------------------------- Physician Orders Details Patient Name: Mitchell Gear Date of Service: 05/24/2018 2:15 PM Medical Record Number: [id number] Patient Account Number: [id number] Date of Birth/Sex: 25-Sep-1955 (63 y.o. M) Treating RN: Huel Coventry Primary Care Provider: Karie Fetch Other Clinician: Referring  Provider: Karie Fetch Treating Provider/Extender: Altamese [place] in Treatment: 8 Verbal / Phone Orders: No Diagnosis Coding Wound Cleansing Wound #1 Midline Sacrum   o Clean wound with Normal Saline. Anesthetic (add to Medication List) Wound #1 Midline Sacrum o Topical Lidocaine 4% cream applied to wound bed prior to debridement (In Clinic Only). Primary Wound Dressing Wound #1 Midline Sacrum o Hydrafera Blue Ready Transfer Secondary Dressing Wound #1 Midline Sacrum o Boardered Foam Dressing Dressing Change Frequency Wound #1 Midline Sacrum o Change Dressing Monday, Wednesday, Friday Follow-up Appointments Wound #1 Midline Sacrum o Return Appointment in 2 weeks. Off-Loading Wound #1 Midline Sacrum o Turn and reposition every 2 hours Home Health Wound #1 Midline Sacrum o Continue Home Health Visits - [facility] o Home Health Nurse may visit PRN to address patientos wound care needs. o FACE TO FACE ENCOUNTER: MEDICARE and MEDICAID PATIENTS: I certify that this patient is under my care and that I had a face-to-face encounter that meets the physician face-to-face encounter requirements with this patient on this date. The encounter with the patient was in whole or in part for the following MEDICAL CONDITION: (primary reason for Home Healthcare) MEDICAL NECESSITY: I certify, that based on my findings, NURSING services are a medically necessary home health service. HOME BOUND STATUS: I certify that my clinical findings support that this patient is homebound (i.e., Due to illness or injury, pt requires aid of supportive devices such as crutches, cane, wheelchairs, walkers, the use of special transportation or the assistance of another person to leave their place of residence. There is a normal inability to leave the home and doing so requires considerable and taxing effort. Other absences are for medical reasons / religious services and are  infrequent or of short duration when for other reasons). Mitchell Dixon, Mitchell Dixon ([id number]) o If current dressing causes regression in wound condition, may D/C ordered dressing product/s and apply Normal Saline Moist Dressing daily until next Wound Healing Center / Other MD appointment. Notify Wound Healing Center of regression in wound condition at [phone]. o Please direct any NON-WOUND related issues/requests for orders to patient's Primary Care Physician Electronic Signature(s) Signed: 05/24/2018 5:18:51 PM By: Baltazar Najjar MD Signed: 05/25/2018 9:14:45 AM By: Elliot Gurney, BSN, RN, CWS, Kim RN, BSN Entered By: Elliot Gurney, BSN, RN, CWS, Kim on 05/24/2018 15:21:08 Mitchell Dixon, Mitchell Dixon ([id number]) -------------------------------------------------------------------------------- Problem List Details Patient Name: Mitchell Gear Date of Service: 05/24/2018 2:15 PM Medical Record Number: [id number] Patient Account Number: [id number] Date of Birth/Sex: Jun 17, 1955 (63 y.o. M) Treating RN: Huel Coventry Primary Care Provider: Karie Fetch Other Clinician: Referring Provider: Karie Fetch Treating Provider/Extender: Altamese [place] in Treatment: 8 Active Problems ICD-10 Evaluated Encounter Code Description Active Date Today Diagnosis L89.154 Pressure ulcer of sacral region, stage 4 03/29/2018 No Yes G82.21 Paraplegia, complete 03/29/2018 No Yes Inactive Problems Resolved Problems ICD-10 Code Description Active Date Resolved Date L89.312 Pressure ulcer of right buttock, stage 2 03/29/2018 03/29/2018 Electronic Signature(s) Signed: 05/24/2018 5:18:51 PM By: Baltazar Najjar MD Entered By: Baltazar Najjar on 05/24/2018 17:03:04 Mitchell Gear ([id number]) -------------------------------------------------------------------------------- Progress Note Details Patient Name: Mitchell Gear Date of Service: 05/24/2018 2:15 PM Medical Record Number: [id number] Patient Account Number:  [id number] Date of Birth/Sex: 10/15/1955 (63 y.o. 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 He has been followed at the [facility] wound care clinic/podiatry for  wounds on his bilateral feet. He has home health. We did not look at these today. Over the last 3 weeks they noticed a small area on his sacrum which has matured into a fairly substantial pressure ulcer. He is here for our review of this. 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 He also required an embolectomy and fasciectomy of the right leg and he currently has wounds on the right heel and left ankle but as stated these are being followed at [facility]. 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 X-ray we did last week did not show evidence of acute osteomyelitis in the sacrum. The patient has a pacemaker and will need a CT scan of this area to exclude osteomyelitis. Swab culture I did showed a few Enterococcus faecalis and a few enterococcus avium I started him on Augmentin 875 twice daily for 10 days on 2/3 2/12; patient is still on Augmentin but he should finish up before I see him next week. We have had some trouble with Medicaid and CT scan  authorization. The wound is probably actually on S4-S5 so we will ask for a CT scan of this area rather than the in the pelvis. We have been using silver alginate as the primary dressing in the wound is cleaning up quite nicely 2/19; 2/19; the patient has completed antibiotics. His CT scan is booked for 2/25; we have been using silver alginate. 2/26; the patient had a CT scan done. Unfortunately they did not even comment on the area of the lower sacrum that we wanted them to look at. However they discovered an incidentally very large complex mass anterior to the left hip and left femur measuring 13.4 x 7.3 x 5.1. This had the appearance of a possible bone or soft tissue malignancy. They recommended an MRI but he has a pacemaker and I think that would preclude that. I have sent him for review by orthopedics. I suspect they will need to biopsy. Wound is about the same size. Some serosanguineous drainage we have been using silver alginate 3/4; using silver alginate. Culture I did last week showed few MSSA and Corynebacterium. I put him on Keflex. We are attempting to arrange an orthopedic consult 3/11; using silver alginate. We are making nice progress in the wound surface. There is still undermining around the granulation tissue. He is completing the Keflex that I put him on 2 weeks ago. He sees orthopedics on Friday this week with regards to the incidentally discovered mass in his left hip and femur 3/25. We have been using silver alginate to the patient's originally stage IV pressure ulcer. Most of this is filled in nicely in fact he is hyper granulated. He went to see orthopedics at my request Dr. Real Cons. 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 He referred him to orthopedic oncologist at [facility] although the patient has not heard about this referral. In fact in this environment it is difficult to get anybody in to see consultants since there is no elective  procedures  being done. Unfortunately the area was not biopsied Mitchell Dixon, Mitchell Dixon ([id number]) Objective Constitutional Patient is hypertensive.. Pulse regular and within target range for patient.Marland Kitchen Respirations regular, non-labored and within target range.. Temperature is normal and within the target range for the patient.Marland Kitchen appears in no distress. Vitals Time Taken: 2:33 PM, Height: 70 in, Weight: 140 lbs, BMI: 20.1, Temperature: 97.5 F, Pulse: 63 bpm, Respiratory Rate: 16 breaths/min, Blood Pressure: 143/89 mmHg. Respiratory Respiratory effort is easy and symmetric bilaterally. Rate is normal at rest and on room air.Marland Kitchen Psychiatric No evidence of depression, anxiety, or agitation. Calm, cooperative, and communicative. Appropriate interactions and affect.. General Notes: Wound exam; the area is really making nice progress. There is no tunneling hyper granulation which I think was beyond use of silver nitrate. I therefore remove the hyper granulated area with a #15 scalpel hemostasis with still silver nitrate Integumentary (Hair, Skin) Wound #1 status is Open. Original cause of wound was Gradually Appeared. The wound is located on the Midline Sacrum. The wound measures 1.3cm length x 1.6cm width x 0.1cm depth; 1.634cm^2 area and 0.163cm^3 volume. There is Fat Layer (Subcutaneous Tissue) Exposed exposed. There is no tunneling or undermining noted. There is a large amount of serosanguineous drainage noted. Foul odor after cleansing was noted. The wound margin is flat and intact. There is large (67-100%) red, hyper - granulation within the wound bed. There is no necrotic tissue within the wound bed. The periwound skin appearance did not exhibit: Callus, Crepitus, Excoriation, Induration, Rash, Scarring, Dry/Scaly, Maceration, Atrophie Blanche, Cyanosis, Ecchymosis, Hemosiderin Staining, Mottled, Pallor, Rubor, Erythema. Periwound temperature was noted as No Abnormality. Assessment Active  Problems ICD-10 Pressure ulcer of sacral region, stage 4 Paraplegia, complete Procedures Wound #1 Pre-procedure diagnosis of Wound #1 is a Pressure Ulcer located on the Midline Sacrum . There was a Excisional Mitchell Dixon, Mitchell Dixon. ([id number]) Skin/Subcutaneous Tissue Debridement with a total area of 2.08 sq cm performed by Maxwell Caul, MD. With the following instrument(s): Blade to remove Viable and Non-Viable tissue/material. Material removed includes Subcutaneous Tissue after achieving pain control using Lidocaine. No specimens were taken. A time out was conducted at 15:21, prior to the start of the procedure. A Moderate amount of bleeding was controlled with Silver Nitrate. The procedure was tolerated well. Post Debridement Measurements: 1.3cm length x 1.6cm width x 0.1cm depth; 0.163cm^3 volume. Post debridement Stage noted as Category/Stage IV. Character of Wound/Ulcer Post Debridement is stable. Post procedure Diagnosis Wound #1: Same as Pre-Procedure Plan Wound Cleansing: Wound #1 Midline Sacrum: Clean wound with Normal Saline. Anesthetic (add to Medication List): Wound #1 Midline Sacrum: Topical Lidocaine 4% cream applied to wound bed prior to debridement (In Clinic Only). Primary Wound Dressing: Wound #1 Midline Sacrum: Hydrafera Blue Ready Transfer Secondary Dressing: Wound #1 Midline Sacrum: Boardered Foam Dressing Dressing Change Frequency: Wound #1 Midline Sacrum: Change Dressing Monday, Wednesday, Friday Follow-up Appointments: Wound #1 Midline Sacrum: Return Appointment in 2 weeks. Off-Loading: Wound #1 Midline Sacrum: Turn and reposition every 2 hours Home Health: Wound #1 Midline Sacrum: Continue Home Health Visits - [facility] Health Nurse may visit PRN to address patient s wound care needs. FACE TO FACE ENCOUNTER: MEDICARE and MEDICAID PATIENTS: I certify that this patient is under my care and that I had a face-to-face encounter that  meets the physician face-to-face encounter requirements with this patient on this date. The encounter with the patient was in whole or in part for the following MEDICAL CONDITION: (primary reason for Home Healthcare)  MEDICAL NECESSITY: I certify, that based on my findings, NURSING services are a medically necessary home health service. HOME BOUND STATUS: I certify that my clinical findings support that this patient is homebound (i.e., Due to illness or injury, pt requires aid of supportive devices such as crutches, cane, wheelchairs, walkers, the use of special transportation or the assistance of another person to leave their place of residence. There is a normal inability to leave the home and doing so requires considerable and taxing effort. Other absences are for medical reasons / religious services and are infrequent or of short duration when for other reasons). If current dressing causes regression in wound condition, may D/C ordered dressing product/s and apply Normal Saline Moist Dressing daily until next Wound Healing Center / Other MD appointment. Notify Wound Healing Center of regression in wound condition at [phone]. Please direct any NON-WOUND related issues/requests for orders to patient's Primary Care Physician Mitchell Dixon, Mitchell Dixon ([id number]) 1. We continue to make excellent progress here. The hyper granulation was removed down to the level of the surrounding skin. 2. I have changed the dressing to Hydrofera Blue 3. I have asked the patient to let us know whether he hears from orthopedic oncology at [facility] although then this environment I would not be surprised if this is delayed. By that tone of Dr. Samuel Germany note I do not think he really feels this is malignant. 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 I am not aware that he has had surgery in this area or fractures Electronic Signature(s) Signed: 05/24/2018 5:18:51 PM  By: Baltazar Najjar MD Entered By: Baltazar Najjar on 05/24/2018 17:07:50 Crayton, [name] ([id number]) -------------------------------------------------------------------------------- SuperBill Details Patient Name: Mitchell Gear Date of Service: 05/24/2018 Medical Record Number: [id number] Patient Account Number: [id number] Date of Birth/Sex: 03-02-1955 (63 y.o. M) Treating RN: Huel Coventry Primary Care Provider: Karie Fetch Other Clinician: Referring Provider: Karie Fetch Treating Provider/Extender: Altamese [place] in Treatment: 8 Diagnosis Coding ICD-10 Codes Code Description L89.154 Pressure ulcer of sacral region, stage 4 G82.21 Paraplegia, complete Facility Procedures CPT4 Code: [id number] Description: 11042 - DEB SUBQ TISSUE 20 SQ CM/< ICD-10 Diagnosis Description L89.154 Pressure ulcer of sacral region, stage 4 Modifier: Quantity: 1 Physician Procedures CPT4 Code: [id number] Description: 11042 - WC PHYS SUBQ TISS 20 SQ CM ICD-10 Diagnosis Description L89.154 Pressure ulcer of sacral region, stage 4 Modifier: Quantity: 1 Electronic Signature(s) Signed: 05/24/2018 5:18:51 PM By: Baltazar Najjar MD Entered By: Baltazar Najjar on 05/24/2018 17:08:03

## 2018-06-07 ENCOUNTER — Encounter: Payer: Medicaid Other | Attending: Internal Medicine | Admitting: Internal Medicine

## 2018-06-07 ENCOUNTER — Other Ambulatory Visit: Payer: Self-pay

## 2018-06-07 DIAGNOSIS — I10 Essential (primary) hypertension: Secondary | ICD-10-CM | POA: Diagnosis not present

## 2018-06-07 DIAGNOSIS — Z7901 Long term (current) use of anticoagulants: Secondary | ICD-10-CM | POA: Diagnosis not present

## 2018-06-07 DIAGNOSIS — I4891 Unspecified atrial fibrillation: Secondary | ICD-10-CM | POA: Insufficient documentation

## 2018-06-07 DIAGNOSIS — G8221 Paraplegia, complete: Secondary | ICD-10-CM | POA: Insufficient documentation

## 2018-06-07 DIAGNOSIS — Z950 Presence of cardiac pacemaker: Secondary | ICD-10-CM | POA: Insufficient documentation

## 2018-06-07 DIAGNOSIS — Z09 Encounter for follow-up examination after completed treatment for conditions other than malignant neoplasm: Secondary | ICD-10-CM | POA: Insufficient documentation

## 2018-06-07 DIAGNOSIS — L89154 Pressure ulcer of sacral region, stage 4: Secondary | ICD-10-CM | POA: Diagnosis present

## 2018-06-07 NOTE — Progress Notes (Signed)
Mitchell, Dixon ([id number]) Visit Report for 06/07/2018 Arrival Information Details Patient Name: Mitchell Dixon, Mitchell Dixon Date of Service: 06/07/2018 10:00 AM Medical Record Number: [id number] Patient Account Number: [id number] Date of Birth/Sex: 1955-10-10 (63 y.o. M) Treating RN: Curtis Sites Primary Care Kiet Geer: Karie Fetch Other Clinician: Referring Kalin Kyler: Karie Fetch Treating Dontell Mian/Extender: Altamese [place] in Treatment: 10 Visit Information History Since Last Visit Added or deleted any medications: No Patient Arrived: Wheel Chair Any new allergies or adverse reactions: No Arrival Time: 10:22 Had a fall or experienced change in No Accompanied By: self activities of daily living that may affect Transfer Assistance: Transfer Board risk of falls: Patient Identification Verified: Yes Signs or symptoms of abuse/neglect since last visito No Secondary Verification Process Completed: Yes Hospitalized since last visit: No Implantable device outside of the clinic excluding No cellular tissue based products placed in the center since last visit: Has Dressing in Place as Prescribed: Yes Pain Present Now: No Electronic Signature(s) Signed: 06/07/2018 2:47:29 PM By: Curtis Sites Entered By: Curtis Sites on 06/07/2018 10:22:32 Mitchell Dixon ([id number]) -------------------------------------------------------------------------------- Encounter Discharge Information Details Patient Name: Mitchell Dixon Date of Service: 06/07/2018 10:00 AM Medical Record Number: [id number] Patient Account Number: [id number] Date of Birth/Sex: 03/16/1955 (63 y.o. M) Treating RN: Huel Coventry Primary Care Layth Cerezo: Karie Fetch Other Clinician: Referring Timoteo Carreiro: Karie Fetch Treating Jennifr Gaeta/Extender: Altamese [place] in Treatment: 10 Encounter Discharge Information Items Discharge Condition: Stable Ambulatory Status: Ambulatory Discharge Destination:  Home Transportation: Private Auto Accompanied By: self Schedule Follow-up Appointment: Yes Clinical Summary of Care: Electronic Signature(s) Signed: 06/07/2018 4:47:28 PM By: Elliot Gurney, BSN, RN, CWS, Kim RN, BSN Entered By: Elliot Gurney, BSN, RN, CWS, Kim on 06/07/2018 10:48:58 Mitchell Dixon ([id number]) -------------------------------------------------------------------------------- Lower Extremity Assessment Details Patient Name: Mitchell Dixon Date of Service: 06/07/2018 10:00 AM Medical Record Number: [id number] Patient Account Number: [id number] Date of Birth/Sex: 24-Jul-1955 (63 y.o. M) Treating RN: Curtis Sites Primary Care Jeslin Bazinet: Karie Fetch Other Clinician: Referring Abigael Mogle: Karie Fetch Treating Maryana Pittmon/Extender: Altamese [place] in Treatment: 10 Electronic Signature(s) Signed: 06/07/2018 2:47:29 PM By: Curtis Sites Entered By: Curtis Sites on 06/07/2018 10:32:09 Mitchell Dixon ([id number]) -------------------------------------------------------------------------------- Multi Wound Chart Details Patient Name: Mitchell Dixon Date of Service: 06/07/2018 10:00 AM Medical Record Number: [id number] Patient Account Number: [id number] Date of Birth/Sex: September 28, 1955 (63 y.o. M) Treating RN: Huel Coventry Primary Care Osie Merkin: Karie Fetch Other Clinician: Referring Toniesha Zellner: Karie Fetch Treating [place] Greb/Extender: Altamese [place] in Treatment: 10 Vital Signs Height(in): 70 Pulse(bpm): 62 Weight(lbs): 140 Blood Pressure(mmHg): 144/96 Body Mass Index(BMI): 20 Temperature(F): Respiratory Rate 16 (breaths/min): Photos: [N/A:N/A] Wound Location: Sacrum - Midline N/A N/A Wounding Event: Gradually Appeared N/A N/A Primary Etiology: Pressure Ulcer N/A N/A Comorbid History: Arrhythmia, Deep Vein N/A N/A Thrombosis, Hypertension, History of pressure wounds, Paraplegia Date Acquired: 03/08/2018 N/A N/A Weeks of Treatment: 10 N/A N/A Wound Status:  Open N/A N/A Measurements L x W x D 0.5x0.7x0.1 N/A N/A (cm) Area (cm) : 0.275 N/A N/A Volume (cm) : 0.027 N/A N/A % Reduction in Area: 96.50% N/A N/A % Reduction in Volume: 99.70% N/A N/A Classification: Category/Stage IV N/A N/A Exudate Amount: Medium N/A N/A Exudate Type: Sanguinous N/A N/A Exudate Color: red N/A N/A Foul Odor After Cleansing: Yes N/A N/A Odor Anticipated Due to No N/A N/A Product Use: Wound Margin: Flat and Intact N/A N/A Granulation Amount: Large (67-100%) N/A N/A Granulation Quality: Red, Hyper-granulation N/A N/A Necrotic Amount: None Present (0%) N/A N/A Exposed Structures: Fat Layer (  Subcutaneous N/A N/A Tissue) Exposed: Yes DAYTONA, TRAM. ([id number]) Fascia: No Tendon: No Muscle: No Joint: No Bone: No Epithelialization: None N/A N/A Periwound Skin Texture: Scarring: Yes N/A N/A Excoriation: No Induration: No Callus: No Crepitus: No Rash: No Periwound Skin Moisture: Maceration: No N/A N/A Dry/Scaly: No Periwound Skin Color: Atrophie Blanche: No N/A N/A Cyanosis: No Ecchymosis: No Erythema: No Hemosiderin Staining: No Mottled: No Pallor: No Rubor: No Temperature: No Abnormality N/A N/A Tenderness on Palpation: No N/A N/A Procedures Performed: CHEM CAUT GRANULATION N/A N/A TISS Treatment Notes Wound #1 (Midline Sacrum) Notes hydrofera bue, bordered foam dressing Electronic Signature(s) Signed: 06/07/2018 3:44:34 PM By: Baltazar Najjar MD Entered By: Baltazar Najjar on 06/07/2018 12:41:11 Mitchell Dixon ([id number]) -------------------------------------------------------------------------------- Multi-Disciplinary Care Plan Details Patient Name: Mitchell Dixon Date of Service: 06/07/2018 10:00 AM Medical Record Number: [id number] Patient Account Number: [id number] Date of Birth/Sex: 06/29/1955 (63 y.o. M) Treating RN: Huel Coventry Primary Care Breella Vanostrand: Karie Fetch Other Clinician: Referring Malorie Bigford: Karie Fetch Treating Gardy Montanari/Extender: Altamese [place] in Treatment: 10 Active Inactive Necrotic Tissue Nursing Diagnoses: Impaired tissue integrity related to necrotic/devitalized tissue Goals: Necrotic/devitalized tissue will be minimized in the wound bed Date Initiated: 03/29/2018 Target Resolution Date: 03/29/2018 Goal Status: Active Interventions: Assess patient pain level pre-, during and post procedure and prior to discharge Treatment Activities: Apply topical anesthetic as ordered : 03/29/2018 Excisional debridement : 03/29/2018 Notes: Orientation to the Wound Care Program Nursing Diagnoses: Knowledge deficit related to the wound healing center program Goals: Patient/caregiver will verbalize understanding of the Wound Healing Center Program Date Initiated: 03/29/2018 Target Resolution Date: 03/29/2018 Goal Status: Active Interventions: Provide education on orientation to the wound center Notes: Pressure Nursing Diagnoses: Knowledge deficit related to management of pressures ulcers Potential for impaired tissue integrity related to pressure, friction, moisture, and shear Goals: Patient/caregiver will verbalize understanding of pressure ulcer management JODEE, MARQUARD ([id number]) Date Initiated: 03/29/2018 Target Resolution Date: 03/29/2018 Goal Status: Active Interventions: Assess: immobility, friction, shearing, incontinence upon admission and as needed Provide education on pressure ulcers Treatment Activities: Patient referred for home evaluation of offloading devices/mattresses : 03/29/2018 Notes: Soft Tissue Infection Nursing Diagnoses: Impaired tissue integrity Potential for infection: soft tissue Goals: Patient will remain free of wound infection Date Initiated: 03/29/2018 Target Resolution Date: 03/29/2018 Goal Status: Active Signs and symptoms of infection will be recognized early to allow for prompt treatment Date Initiated: 03/29/2018 Target  Resolution Date: 03/29/2018 Goal Status: Active Interventions: Assess signs and symptoms of infection every visit Treatment Activities: Culture and sensitivity : 03/29/2018 Notes: Wound/Skin Impairment Nursing Diagnoses: Impaired tissue integrity Goals: Patient/caregiver will verbalize understanding of skin care regimen Date Initiated: 03/29/2018 Target Resolution Date: 03/29/2018 Goal Status: Active Ulcer/skin breakdown will have a volume reduction of 30% by week 4 Date Initiated: 03/29/2018 Target Resolution Date: 03/29/2018 Goal Status: Active Interventions: Assess ulceration(s) every visit Treatment Activities: Patient referred to home care : 03/29/2018 Skin care regimen initiated : 03/29/2018 DINK, TILTON ([id number]) Notes: Electronic Signature(s) Signed: 06/07/2018 4:47:28 PM By: Elliot Gurney, BSN, RN, CWS, Kim RN, BSN Entered By: Elliot Gurney, BSN, RN, CWS, Kim on 06/07/2018 10:41:56 Mitchell Dixon ([id number]) -------------------------------------------------------------------------------- Pain Assessment Details Patient Name: Mitchell Dixon Date of Service: 06/07/2018 10:00 AM Medical Record Number: [id number] Patient Account Number: [id number] Date of Birth/Sex: May 23, 1955 (62 y.o. M) Treating RN: Curtis Sites Primary Care Rafiq Bucklin: Karie Fetch Other Clinician: Referring Jerene Yeager: Karie Fetch Treating Nashia Remus/Extender: Altamese [place] in Treatment: 10 Active Problems Location of Pain Severity and  Description of Pain Patient Has Paino No Site Locations Pain Management and Medication Current Pain Management: Electronic Signature(s) Signed: 06/07/2018 2:47:29 PM By: Curtis Sitesorthy, Joanna Entered By: Curtis Sitesorthy, Joanna on 06/07/2018 10:22:38 Mitchell GearPATTERSON, Joseth N. ([id number]) -------------------------------------------------------------------------------- Patient/Caregiver Education Details Patient Name: Mitchell GearPATTERSON, Jjesus N. Date of Service: 06/07/2018 10:00 AM Medical  Record Number: [id number] Patient Account Number: [id number] Date of Birth/Gender: [birth year] (63 y.o. M) Treating RN: Huel CoventryWoody, Kim Primary Care Physician: Karie FetchAYCOCK, NGWE Other Clinician: Referring Physician: Karie FetchAYCOCK, NGWE Treating Physician/Extender: Altamese CarolinaOBSON, MICHAEL G Weeks in Treatment: 10 Education Assessment Education Provided To: Patient Education Topics Provided Wound/Skin Impairment: Handouts: Caring for Your Ulcer Methods: Demonstration, Explain/Verbal Responses: State content correctly Electronic Signature(s) Signed: 06/07/2018 4:47:28 PM By: Elliot GurneyWoody, BSN, RN, CWS, Kim RN, BSN Entered By: Elliot GurneyWoody, BSN, RN, CWS, Kim on 06/07/2018 10:48:20 Mitchell GearPATTERSON, Axton N. ([id number]) -------------------------------------------------------------------------------- Wound Assessment Details Patient Name: Mitchell GearPATTERSON, Thurlow N. Date of Service: 06/07/2018 10:00 AM Medical Record Number: [id number] Patient Account Number: [id number] Date of Birth/Sex: [birth year] (62 y.o. M) Treating RN: Curtis Sitesorthy, Joanna Primary Care Pleasant Bensinger: Karie FetchAYCOCK, NGWE Other Clinician: Referring Arieon Scalzo: Karie FetchAYCOCK, NGWE Treating Jemel Ono/Extender: Altamese CarolinaOBSON, MICHAEL G Weeks in Treatment: 10 Wound Status Wound Number: 1 Primary Pressure Ulcer Etiology: Wound Location: Sacrum - Midline Wound Open Wounding Event: Gradually Appeared Status: Date Acquired: 03/08/2018 Comorbid Arrhythmia, Deep Vein Thrombosis, Weeks Of Treatment: 10 History: Hypertension, History of pressure wounds, Clustered Wound: No Paraplegia Photos Wound Measurements Length: (cm) 0.5 % Reduction i Width: (cm) 0.7 % Reduction i Depth: (cm) 0.1 Epithelializa Area: (cm) 0.275 Tunneling: Volume: (cm) 0.027 Undermining: n Area: 96.5% n Volume: 99.7% tion: None No No Wound Description Classification: Category/Stage IV Foul Odor Aft Wound Margin: Flat and Intact Due to Produc Exudate Amount: Medium Slough/Fibrin Exudate Type: Sanguinous Exudate Color: red er  Cleansing: Yes t Use: No o Yes Wound Bed Granulation Amount: Large (67-100%) Exposed Structure Granulation Quality: Red, Hyper-granulation Fascia Exposed: No Necrotic Amount: None Present (0%) Fat Layer (Subcutaneous Tissue) Exposed: Yes Tendon Exposed: No Muscle Exposed: No Joint Exposed: No Bone Exposed: No Periwound Skin Texture Texture Color Spells, Tatsuya N. ([id number]) No Abnormalities Noted: No No Abnormalities Noted: No Callus: No Atrophie Blanche: No Crepitus: No Cyanosis: No Excoriation: No Ecchymosis: No Induration: No Erythema: No Rash: No Hemosiderin Staining: No Scarring: Yes Mottled: No Pallor: No Moisture Rubor: No No Abnormalities Noted: No Dry / Scaly: No Temperature / Pain Maceration: No Temperature: No Abnormality Treatment Notes Wound #1 (Midline Sacrum) Notes hydrofera bue, bordered foam dressing Electronic Signature(s) Signed: 06/07/2018 2:47:29 PM By: Curtis Sitesorthy, Joanna Entered By: Curtis Sitesorthy, Joanna on 06/07/2018 10:31:57 Mitchell GearPATTERSON, Jovanni N. ([id number]) -------------------------------------------------------------------------------- Vitals Details Patient Name: Mitchell GearPATTERSON, Furqan N. Date of Service: 06/07/2018 10:00 AM Medical Record Number: [id number] Patient Account Number: [id number] Date of Birth/Sex: [birth year] (63 y.o. M) Treating RN: Curtis Sitesorthy, Joanna Primary Care Kirstin Kugler: Karie FetchAYCOCK, NGWE Other Clinician: Referring Edward Trevino: Karie FetchAYCOCK, NGWE Treating Humna [place]/Extender: Altamese CarolinaOBSON, MICHAEL G Weeks in Treatment: 10 Vital Signs Time Taken: 10:27 Pulse (bpm): 62 Height (in): 70 Respiratory Rate (breaths/min): 16 Weight (lbs): 140 Blood Pressure (mmHg): 144/96 Body Mass Index (BMI): 20.1 Reference Range: 80 - 120 mg / dl Notes unable to get temp - 2 attempts orally and 1 axxillary - MD notified Electronic Signature(s) Signed: 06/07/2018 2:47:29 PM By: Curtis Sitesorthy, Joanna Entered By: Curtis Sitesorthy, Joanna on 06/07/2018 10:28:40

## 2018-06-07 NOTE — Progress Notes (Signed)
JACQUIN, BALDEZ ([id number]) Visit Report for 06/07/2018 HPI Details Patient Name: Mitchell Dixon, Mitchell Dixon Date of Service: 06/07/2018 10:00 AM Medical Record Number: [id number] Patient Account Number: [id number] Date of Birth/Sex: 10/04/1955 (63 y.o. 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 He has been followed at the [facility] wound care clinic/podiatry for wounds on his bilateral feet. He has home health. We did not look at these today. Over the last 3 weeks they noticed a small area on his sacrum which has matured into a fairly substantial pressure ulcer. He is here for our review of this. 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 He also required an embolectomy and fasciectomy of the right leg and he currently has wounds on the right heel and left ankle but as stated these are being followed at [facility]. 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 X-ray we did last week did not show evidence of acute osteomyelitis in the sacrum. The patient has a pacemaker and will need a CT scan of this area to exclude osteomyelitis. Swab culture I did showed a few Enterococcus faecalis and a few enterococcus avium I  started him on Augmentin 875 twice daily for 10 days on 2/3 2/12; patient is still on Augmentin but he should finish up before I see him next week. We have had some trouble with Medicaid and CT scan authorization. The wound is probably actually on S4-S5 so we will ask for a CT scan of this area rather than the in the pelvis. We have been using silver alginate as the primary dressing in the wound is cleaning up quite nicely 2/19; 2/19; the patient has completed antibiotics. His CT scan is booked for 2/25; we have been using silver alginate. 2/26; the patient had a CT scan done. Unfortunately they did not even comment on the area of the lower sacrum that we wanted them to look at. However they discovered an incidentally very large complex mass anterior to the left hip and left femur measuring 13.4 x 7.3 x 5.1. This had the appearance of a possible bone or soft tissue malignancy. They recommended an MRI but he has a pacemaker and I think that would preclude that. I have sent him for review by orthopedics. I suspect they will need to biopsy. Wound is about the same size. Some serosanguineous drainage we have been using silver alginate 3/4; using silver alginate. Culture I did last week showed few MSSA and Corynebacterium. I put him on Keflex. We are attempting to arrange an orthopedic consult 3/11; using silver alginate. We are making nice progress in the wound surface. There is still undermining around the granulation tissue. He is completing the Keflex that I put him on 2 weeks ago. He sees orthopedics on  Friday this week with regards to the incidentally discovered mass in his left hip and femur 3/25. We have been using silver alginate to the patient's originally stage IV pressure ulcer. Most of this is filled in nicely in fact he is hyper granulated. He went to see orthopedics at my request Dr. Real Cons. 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 He referred him  to orthopedic oncologist at [facility] although the patient has not heard about this referral. In fact in this environment it is difficult to get anybody in to see consultants since there is no elective procedures being done. Unfortunately DELAINE, CANTER ([id number]) the area was not biopsied 4/8; patient's stage IV pressure ulcer is down to a very tiny open area. There is some hyper granulation. We have been using Hydrofera Blue. He still has not heard anything from [facility] orthopedics Electronic Signature(s) Signed: 06/07/2018 3:44:34 PM By: Baltazar Najjar MD Entered By: Baltazar Najjar on 06/07/2018 12:42:28 Mitchell Dixon ([id number]) -------------------------------------------------------------------------------- Gaynelle Adu TISS Details Patient Name: Mitchell Dixon Date of Service: 06/07/2018 10:00 AM Medical Record Number: [id number] Patient Account Number: [id number] Date of Birth/Sex: June 19, 1955 (63 y.o. M) Treating RN: Huel Coventry Primary Care Provider: Karie Fetch Other Clinician: Referring Provider: Karie Fetch Treating Provider/Extender: Altamese [place] in Treatment: 10 Procedure Performed for: Wound #1 Midline Sacrum Performed By: Physician Maxwell Caul, MD Post Procedure Diagnosis Same as Pre-procedure Notes 1 sliver nitrate stick used Electronic Signature(s) Signed: 06/07/2018 3:44:34 PM By: Baltazar Najjar MD Entered By: Baltazar Najjar on 06/07/2018 12:41:22 Mitchell Dixon ([id number]) -------------------------------------------------------------------------------- Physical Exam Details Patient Name: Mitchell Dixon Date of Service: 06/07/2018 10:00 AM Medical Record Number: [id number] Patient Account Number: [id number] Date of Birth/Sex: Oct 04, 1955 (63 y.o. M) Treating RN: Huel Coventry Primary Care Provider: Karie Fetch Other Clinician: Referring Provider: Karie Fetch Treating Provider/Extender: Altamese [place] in  Treatment: 10 Constitutional Patient is hypertensive.. Pulse regular and within target range for patient.Marland Kitchen Respirations regular, non-labored and within target range.. Temperature is normal and within the target range for the patient.Marland Kitchen appears in no distress. Notes Wound exam; the area is making nice progress. Had half the size of his last visit. We will use silver nitrate on this to bring the hyper granulation down to the level of the surrounding skin. There is no evidence of infection Electronic Signature(s) Signed: 06/07/2018 3:44:34 PM By: Baltazar Najjar MD Entered By: Baltazar Najjar on 06/07/2018 12:44:10 Mitchell Dixon ([id number]) -------------------------------------------------------------------------------- Physician Orders Details Patient Name: Mitchell Dixon Date of Service: 06/07/2018 10:00 AM Medical Record Number: [id number] Patient Account Number: [id number] Date of Birth/Sex: 1955/05/04 (62 y.o. M) Treating RN: Huel Coventry Primary Care Provider: Karie Fetch Other Clinician: Referring Provider: Karie Fetch Treating Provider/Extender: Altamese [place] in Treatment: 10 Verbal / Phone Orders: No Diagnosis Coding Wound Cleansing Wound #1 Midline Sacrum o Clean wound with Normal Saline. Anesthetic (add to Medication List) Wound #1 Midline Sacrum o Topical Lidocaine 4% cream applied to wound bed prior to debridement (In Clinic Only). Primary Wound Dressing Wound #1 Midline Sacrum o Hydrafera Blue Ready Transfer Secondary Dressing Wound #1 Midline Sacrum o Boardered Foam Dressing Dressing Change Frequency Wound #1 Midline Sacrum o Change Dressing Monday, Wednesday, Friday Follow-up Appointments Wound #1 Midline Sacrum o Return Appointment in 2 weeks. Off-Loading Wound #1 Midline Sacrum o Turn and reposition every 2 hours Home Health Wound #1 Midline Sacrum o Continue Home Health Visits - [facility] o Home  Health Nurse  may visit PRN to address patientos wound care needs. o FACE TO FACE ENCOUNTER: MEDICARE and MEDICAID PATIENTS: I certify that this patient is under my care and that I had a face-to-face encounter that meets the physician face-to-face encounter requirements with this patient on this date. The encounter with the patient was in whole or in part for the following MEDICAL CONDITION: (primary reason for Home Healthcare) MEDICAL NECESSITY: I certify, that based on my findings, NURSING services are a medically necessary home health service. HOME BOUND STATUS: I certify that my clinical findings support that this patient is homebound (i.e., Due to illness or injury, pt requires aid of supportive devices such as crutches, cane, wheelchairs, walkers, the use of special transportation or the assistance of another person to leave their place of residence. There is a normal inability to leave the home and doing so requires considerable and taxing effort. Other absences are for medical reasons / religious services and are infrequent or of short duration when for other reasons). WILLAIM, MODE ([id number]) o If current dressing causes regression in wound condition, may D/C ordered dressing product/s and apply Normal Saline Moist Dressing daily until next Wound Healing Center / Other MD appointment. Notify Wound Healing Center of regression in wound condition at [phone]. o Please direct any NON-WOUND related issues/requests for orders to patient's Primary Care Physician Electronic Signature(s) Signed: 06/07/2018 3:44:34 PM By: Baltazar Najjar MD Signed: 06/07/2018 4:47:28 PM By: Elliot Gurney, BSN, RN, CWS, Kim RN, BSN Entered By: Elliot Gurney, BSN, RN, CWS, Kim on 06/07/2018 10:43:25 Mitchell Dixon ([id number]) -------------------------------------------------------------------------------- Problem List Details Patient Name: Mitchell Dixon Date of Service: 06/07/2018 10:00 AM Medical Record Number:  [id number] Patient Account Number: [id number] Date of Birth/Sex: 10/02/1955 (63 y.o. M) Treating RN: Huel Coventry Primary Care Provider: Karie Fetch Other Clinician: Referring Provider: Karie Fetch Treating Provider/Extender: Altamese [place] in Treatment: 10 Active Problems ICD-10 Evaluated Encounter Code Description Active Date Today Diagnosis L89.154 Pressure ulcer of sacral region, stage 4 03/29/2018 No Yes G82.21 Paraplegia, complete 03/29/2018 No Yes Inactive Problems Resolved Problems ICD-10 Code Description Active Date Resolved Date L89.312 Pressure ulcer of right buttock, stage 2 03/29/2018 03/29/2018 Electronic Signature(s) Signed: 06/07/2018 3:44:34 PM By: Baltazar Najjar MD Entered By: Baltazar Najjar on 06/07/2018 12:40:56 Mitchell Dixon ([id number]) -------------------------------------------------------------------------------- Progress Note Details Patient Name: Mitchell Dixon Date of Service: 06/07/2018 10:00 AM Medical Record Number: [id number] Patient Account Number: [id number] Date of Birth/Sex: [birth year] (62 y.o. 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 He has been followed at the [facility] wound care clinic/podiatry for wounds on his bilateral feet. He has home health. We did not look at these today. Over the last 3 weeks they noticed a small area on his sacrum which has matured into a fairly substantial pressure ulcer. He is here for our review of this. 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 He also required an embolectomy and fasciectomy of the right leg and he currently has wounds on the  right heel and left ankle but as stated these are being followed at [facility]. 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 X-ray we did last week did not show evidence of acute osteomyelitis in the sacrum. The patient has a pacemaker and will need a CT scan of this area to exclude osteomyelitis. Swab culture I did showed a few Enterococcus faecalis and a few enterococcus avium I started him on Augmentin 875 twice daily for 10 days on 2/3 2/12; patient is still on Augmentin but he should finish up before I see him next week. We have had some trouble with Medicaid and CT scan authorization. The wound is probably actually on S4-S5 so we will ask for a CT scan of this area rather than the in the pelvis. We have been using silver alginate as the primary dressing in the wound is cleaning up quite nicely 2/19; 2/19; the patient has completed antibiotics. His CT scan is booked for 2/25; we have been using silver alginate. 2/26; the patient had a CT scan done. Unfortunately they did not even comment on the area of the lower sacrum that we wanted them to look at. However they discovered an incidentally very large complex mass anterior to the left hip and left femur measuring 13.4 x 7.3 x 5.1. This had the appearance of a possible bone or soft tissue malignancy. They recommended an MRI but he has a pacemaker and I think that would preclude that. I have sent him for review by orthopedics. I suspect they will need to biopsy. Wound is about the same size. Some serosanguineous  drainage we have been using silver alginate 3/4; using silver alginate. Culture I did last week showed few MSSA and Corynebacterium. I put him on Keflex. We are attempting to arrange an orthopedic consult 3/11; using silver alginate. We are making nice progress in the wound surface. There is still undermining around the granulation tissue. He is completing the Keflex that I put him on 2 weeks ago. He sees orthopedics on Friday this week with regards to the incidentally discovered mass in his left hip and femur 3/25. We have been using silver alginate to the patient's originally stage IV pressure ulcer. Most of this is filled in nicely in fact he is hyper granulated. He went to see orthopedics at my request Dr. Real Cons. 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 He referred him to orthopedic oncologist at [facility] although the patient has not heard about this referral. In fact in this environment it is difficult to get anybody in to see consultants since there is no elective procedures being done. Unfortunately the area was not biopsied 4/8; patient's stage IV pressure ulcer is down to a very tiny open area. There is some hyper granulation. We have been using Hydrofera Blue. OLUWATOSIN, HIGGINSON ([id number]) He still has not heard anything from [facility] orthopedics Objective Constitutional Patient is hypertensive.. Pulse regular and within target range for patient.Marland Kitchen Respirations regular, non-labored and within target range.. Temperature is normal and within the target range for the patient.Marland Kitchen appears in no distress. Vitals Time Taken: 10:27 AM, Height: 70 in, Weight: 140 lbs, BMI: 20.1, Pulse: 62 bpm, Respiratory Rate: 16 breaths/min, Blood Pressure: 144/96 mmHg. General  Notes: unable to get temp - 2 attempts orally and 1 axxillary - MD notified General Notes: Wound exam; the area is making nice progress. Had half the size of his last visit. We will use silver nitrate  on this to bring the hyper granulation down to the level of the surrounding skin. There is no evidence of infection Integumentary (Hair, Skin) Wound #1 status is Open. Original cause of wound was Gradually Appeared. The wound is located on the Midline Sacrum. The wound measures 0.5cm length x 0.7cm width x 0.1cm depth; 0.275cm^2 area and 0.027cm^3 volume. There is Fat Layer (Subcutaneous Tissue) Exposed exposed. There is no tunneling or undermining noted. There is a medium amount of sanguinous drainage noted. Foul odor after cleansing was noted. The wound margin is flat and intact. There is large (67- 100%) red, hyper - granulation within the wound bed. There is no necrotic tissue within the wound bed. The periwound skin appearance exhibited: Scarring. The periwound skin appearance did not exhibit: Callus, Crepitus, Excoriation, Induration, Rash, Dry/Scaly, Maceration, Atrophie Blanche, Cyanosis, Ecchymosis, Hemosiderin Staining, Mottled, Pallor, Rubor, Erythema. Periwound temperature was noted as No Abnormality. Assessment Active Problems ICD-10 Pressure ulcer of sacral region, stage 4 Paraplegia, complete Procedures Wound #1 Pre-procedure diagnosis of Wound #1 is a Pressure Ulcer located on the Midline Sacrum . An CHEM CAUT GRANULATION TISS procedure was performed by Maxwell Caul, MD. Post procedure Diagnosis Wound #1: Same as Pre-Procedure MOUSTAPHA, PALKO. ([id number]) Notes: 1 sliver nitrate stick used Plan Wound Cleansing: Wound #1 Midline Sacrum: Clean wound with Normal Saline. Anesthetic (add to Medication List): Wound #1 Midline Sacrum: Topical Lidocaine 4% cream applied to wound bed prior to debridement (In Clinic Only). Primary Wound Dressing: Wound #1 Midline Sacrum: Hydrafera Blue Ready Transfer Secondary Dressing: Wound #1 Midline Sacrum: Boardered Foam Dressing Dressing Change Frequency: Wound #1 Midline Sacrum: Change Dressing Monday, Wednesday,  Friday Follow-up Appointments: Wound #1 Midline Sacrum: Return Appointment in 2 weeks. Off-Loading: Wound #1 Midline Sacrum: Turn and reposition every 2 hours Home Health: Wound #1 Midline Sacrum: Continue Home Health Visits - [facility] Nurse may visit PRN to address patient s wound care needs. FACE TO FACE ENCOUNTER: MEDICARE and MEDICAID PATIENTS: I certify that this patient is under my care and that I had a face-to-face encounter that meets the physician face-to-face encounter requirements with this patient on this date. The encounter with the patient was in whole or in part for the following MEDICAL CONDITION: (primary reason for Home Healthcare) MEDICAL NECESSITY: I certify, that based on my findings, NURSING services are a medically necessary home health service. HOME BOUND STATUS: I certify that my clinical findings support that this patient is homebound (i.e., Due to illness or injury, pt requires aid of supportive devices such as crutches, cane, wheelchairs, walkers, the use of special transportation or the assistance of another person to leave their place of residence. There is a normal inability to leave the home and doing so requires considerable and taxing effort. Other absences are for medical reasons / religious services and are infrequent or of short duration when for other reasons). If current dressing causes regression in wound condition, may D/C ordered dressing product/s and apply Normal Saline Moist Dressing daily until next Wound Healing Center / Other MD appointment. Notify Wound Healing Center of regression in wound condition at [phone]. Please direct any NON-WOUND related issues/requests for orders to patient's Primary Care Physician 1. Continue with Hydrofera Blue 2. This may be healed  by the next time he is here in 2 weeks 3. He has still no appointment with regards to the area of his left hip and thigh. We may need to try to put this  through again if he has not heard in 2 weeks. Electronic Signature(s) Signed: 06/07/2018 3:44:34 PM By: Baltazar Najjarobson, Michael MD Mitchell GearPATTERSON, Daymeon N. ([id number]) Entered By: Baltazar Najjarobson, Michael on 06/07/2018 12:45:06 Mitchell GearTERSON, Benton N. ([id number]) -------------------------------------------------------------------------------- SuperBill Details Patient Name: Mitchell GearPATTERSON, Shan N. Date of Service: 06/07/2018 Medical Record Number: [id number] Patient Account Number: [id number] Date of Birth/Sex: August 13, 1955 (63 y.o. M) Treating RN: Huel CoventryWoody, Kim Primary Care Provider: Karie FetchAYCOCK, NGWE Other Clinician: Referring Provider: Karie FetchAYCOCK, NGWE Treating Provider/Extender: Altamese CarolinaOBSON, MICHAEL G Weeks in Treatment: 10 Diagnosis Coding ICD-10 Codes Code Description L89.154 Pressure ulcer of sacral region, stage 4 G82.21 Paraplegia, complete Facility Procedures CPT4 Code: [id number] Description: 17250 - CHEM CAUT GRANULATION TISS ICD-10 Diagnosis Description L89.154 Pressure ulcer of sacral region, stage 4 Modifier: Quantity: 1 Physician Procedures CPT4 Code: [id number] Description: 17250 - WC PHYS CHEM CAUT GRAN TISSUE ICD-10 Diagnosis Description L89.154 Pressure ulcer of sacral region, stage 4 Modifier: Quantity: 1 Electronic Signature(s) Signed: 06/07/2018 3:44:34 PM By: Baltazar Najjarobson, Michael MD Entered By: Baltazar Najjarobson, Michael on 06/07/2018 12:45:22

## 2018-06-21 ENCOUNTER — Other Ambulatory Visit: Payer: Self-pay

## 2018-06-21 ENCOUNTER — Encounter: Payer: Medicaid Other | Admitting: Internal Medicine

## 2018-06-21 DIAGNOSIS — Z09 Encounter for follow-up examination after completed treatment for conditions other than malignant neoplasm: Secondary | ICD-10-CM | POA: Diagnosis not present

## 2018-06-21 NOTE — Progress Notes (Signed)
Mitchell GearTERSON, Imaad N. ([id number]) Visit Report for 06/21/2018 HPI Details Patient Name: Mitchell GearTERSON, Jahzeel N. Date of Service: 06/21/2018 10:00 AM Medical Record Number: [id number] Patient Account Number: [id number] Date of Birth/Sex: 12-02-1955 (63 y.o. 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 [facility] for wounds on his bilateral feet. He has home health. We did not look at these today. Over the last 3 weeks they noticed a small area on his sacrum which has matured into a fairly substantial pressure ulcer. He is here for our review of this. 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 He also required an embolectomy and fasciectomy of the right leg and he currently has wounds on the right heel and left ankle but as stated [facility]. 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 X-ray we did last week did not show evidence of acute osteomyelitis in the sacrum. The patient has a pacemaker and will need a CT scan of this area to exclude osteomyelitis. Swab culture I did showed a few Enterococcus faecalis and a few enterococcus avium I  started him on Augmentin 875 twice daily for 10 days on 2/3 2/12; patient is still on Augmentin but he should finish up before I see him next week. We have had some trouble with Medicaid and CT scan authorization. The wound is probably actually on S4-S5 so we will ask for a CT scan of this area rather than the in the pelvis. We have been using silver alginate as the primary dressing in the wound is cleaning up quite nicely 2/19; 2/19; the patient has completed antibiotics. His CT scan is booked for 2/25; we have been using silver alginate. 2/26; the patient had a CT scan done. Unfortunately they did not even comment on the area of the lower sacrum that we wanted them to look at. However they discovered an incidentally very large complex mass anterior to the left hip and left femur measuring 13.4 x 7.3 x 5.1. This had the appearance of a possible bone or soft tissue malignancy. They recommended an MRI but he has a pacemaker and I think that would preclude that. I have sent him for review by orthopedics. I suspect they will need to biopsy. Wound is about the same size. Some serosanguineous drainage we have been using silver alginate 3/4; using silver alginate. Culture I did last week showed few MSSA and Corynebacterium. I put him on Keflex. We are attempting to arrange an orthopedic consult 3/11; using silver alginate. We are making nice progress in the wound surface. There is still undermining around the granulation tissue. He is completing the Keflex that I put him on 2 weeks ago. He sees orthopedics on  Friday this week with regards to the incidentally discovered mass in his left hip and femur 3/25. We have been using silver alginate to the patient's originally stage IV pressure ulcer. Most of this is filled in nicely in fact he is hyper granulated. He went to see orthopedics at my request Dr. Real Cons. 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 He referred him  to orthopedic oncologist at [facility] although the patient has not heard about this referral. In fact in this environment it is difficult to get anybody in to see consultants since there is no elective procedures being done. Unfortunately Mitchell Dixon, Mitchell Dixon ([id number]) the area was not biopsied 4/8; patient's stage IV pressure ulcer is down to a very tiny open area. There is some hyper granulation. We have been using Hydrofera Blue. He still has not heard anything from [facility] orthopedics 4/22; the patient's stage IV pressure ulcer is healed. He has nice looking epithelialization normal skin over the area. The area on his left hip and thigh that we discovered incidentally when we imaged the sacral area has still not been addressed. He was supposed to go to [facility] orthopedics but this is not been done. I have asked him to call orthopedics here locally. I still think this man may need a biopsy. Electronic Signature(s) Signed: 06/21/2018 3:44:07 PM By: Baltazar Najjar MD Entered By: Baltazar Najjar on 06/21/2018 10:45:56 Mitchell Dixon ([id number]) -------------------------------------------------------------------------------- Physical Exam Details Patient Name: Mitchell Dixon Date of Service: 06/21/2018 10:00 AM Medical Record Number: [id number] Patient Account Number: [id number] Date of Birth/Sex: Jul 24, 1955 (62 y.o. M) Treating RN: Huel Coventry Primary Care Provider: Karie Fetch Other Clinician: Referring Provider: Karie Fetch Treating Provider/Extender: Altamese [place] in Treatment: 12 Constitutional Sitting or standing Blood Pressure is within target range for patient.. Pulse regular and within target range for patient.Marland Kitchen Respirations regular, non-labored and within target range.. Temperature is normal and within the target range for the patient.Marland Kitchen appears in no distress. Notes 1 the patient's sacral wound which was initially a stage IV wound has healed. 2. He still has the area  on his left hip and thigh. There is no open wound here however his x-ray showed heterotrophic calcifications. Electronic Signature(s) Signed: 06/21/2018 3:44:07 PM By: Baltazar Najjar MD Entered By: Baltazar Najjar on 06/21/2018 10:49:49 Mitchell Dixon ([id number]) -------------------------------------------------------------------------------- Physician Orders Details Patient Name: Mitchell Dixon Date of Service: 06/21/2018 10:00 AM Medical Record Number: [id number] Patient Account Number: [id number] Date of Birth/Sex: 02-Feb-1956 (62 y.o. M) Treating RN: Huel Coventry Primary Care Provider: Karie Fetch Other Clinician: Referring Provider: Karie Fetch Treating Provider/Extender: Altamese [place] in Treatment: 12 Verbal / Phone Orders: No Diagnosis Coding Home Health o D/C Home Health Services Discharge From [facility] Services o Discharge from Wound Care Center - Treatment complete Electronic Signature(s) Signed: 06/21/2018 10:32:30 AM By: Elliot Gurney, BSN, RN, CWS, Kim RN, BSN Signed: 06/21/2018 3:44:07 PM By: Baltazar Najjar MD Entered By: Elliot Gurney, BSN, RN, CWS, Kim on 06/21/2018 10:32:30 Mitchell Dixon, Mitchell Dixon ([id number]) -------------------------------------------------------------------------------- Problem List Details Patient Name: Mitchell Dixon Date of Service: 06/21/2018 10:00 AM Medical Record Number: [id number] Patient Account Number: [id number] Date of Birth/Sex: [birth year] (63 y.o. M) Treating RN: Huel Coventry Primary Care Provider: Karie Fetch Other Clinician: Referring Provider: Karie Fetch Treating Provider/Extender: Altamese [place] in Treatment: 12 Active Problems ICD-10 Evaluated Encounter Code Description Active Date Today Diagnosis L89.154 Pressure ulcer of sacral region, stage 4 03/29/2018 No Yes G82.21 Paraplegia, complete 03/29/2018 No  Yes Inactive Problems Resolved Problems ICD-10 Code Description Active Date Resolved Date L89.312  Pressure ulcer of right buttock, stage 2 03/29/2018 03/29/2018 Electronic Signature(s) Signed: 06/21/2018 3:44:07 PM By: Baltazar Najjar MD Entered By: Baltazar Najjar on 06/21/2018 10:44:40 Greis, [name] ([id number]) -------------------------------------------------------------------------------- Progress Note Details Patient Name: Mitchell Dixon Date of Service: 06/21/2018 10:00 AM Medical Record Number: [id number] Patient Account Number: [id number] Date of Birth/Sex: April 15, 1955 (63 y.o. 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 He has been followed at the [facility] wound care clinic/podiatry for wounds on his bilateral feet. He has home health. We did not look at these today. Over the last 3 weeks they noticed a small area on his sacrum which has matured into a fairly substantial pressure ulcer. He is here for our review of this. 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 He also required an embolectomy and fasciectomy of the right leg and he currently has wounds on the right heel and left ankle but as stated these are being followed at [facility]. 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 X-ray  we did last week did not show evidence of acute osteomyelitis in the sacrum. The patient has a pacemaker and will need a CT scan of this area to exclude osteomyelitis. Swab culture I did showed a few Enterococcus faecalis and a few enterococcus avium I started him on Augmentin 875 twice daily for 10 days on 2/3 2/12; patient is still on Augmentin but he should finish up before I see him next week. We have had some trouble with Medicaid and CT scan authorization. The wound is probably actually on S4-S5 so we will ask for a CT scan of this area rather than the in the pelvis. We have been using silver alginate as the primary dressing in the wound is cleaning up quite nicely 2/19; 2/19; the patient has completed antibiotics. His CT scan is booked for 2/25; we have been using silver alginate. 2/26; the patient had a CT scan done. Unfortunately they did not even comment on the area of the lower sacrum that we wanted them to look at. However they discovered an incidentally very large complex mass anterior to the left hip and left femur measuring 13.4 x 7.3 x 5.1. This had the appearance of a possible bone or soft tissue malignancy. They recommended an MRI but he has a pacemaker and I think that would preclude that. I have sent him for review by orthopedics. I suspect they will need to biopsy. Wound is about the same size. Some serosanguineous drainage we have been using silver alginate 3/4; using silver alginate. Culture I did last week showed few MSSA and Corynebacterium. I put him on Keflex. We are attempting to arrange an orthopedic consult  3/11; using silver alginate. We are making nice progress in the wound surface. There is still undermining around the granulation tissue. He is completing the Keflex that I put him on 2 weeks ago. He sees orthopedics on Friday this week with regards to the incidentally discovered mass in his left hip and femur 3/25. We have been using silver alginate to the  patient's originally stage IV pressure ulcer. Most of this is filled in nicely in fact he is hyper granulated. He went to see orthopedics at my request Dr. Real Cons. 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 He referred him to orthopedic oncologist at [facility] although the patient has not heard about this referral. In fact in this environment it is difficult to get anybody in to see consultants since there is no elective procedures being done. Unfortunately the area was not biopsied 4/8; patient's stage IV pressure ulcer is down to a very tiny open area. There is some hyper granulation. We have been using Hydrofera Blue. Mitchell Dixon, Mitchell Dixon ([id number]) He still has not heard anything from [facility] orthopedics 4/22; the patient's stage IV pressure ulcer is healed. He has nice looking epithelialization normal skin over the area. The area on his left hip and thigh that we discovered incidentally when we imaged the sacral area has still not been addressed. He was supposed to go to [facility] orthopedics but this is not been done. I have asked him to call orthopedics here locally. I still think this man may need a biopsy. Objective Constitutional Sitting or standing Blood Pressure is within target range for patient.. Pulse regular and within target range for patient.Marland Kitchen Respirations regular, non-labored and within target range.. Temperature is normal and within the target range for the patient.Marland Kitchen appears in no distress. Vitals Time Taken: 10:05 AM, Height: 70 in, Weight: 140 lbs, BMI: 20.1, Temperature: 97.6 F, Pulse: 69 bpm, Respiratory Rate: 19 breaths/min, Blood Pressure: 133/48 mmHg. General Notes: 1 the patient's sacral wound which was initially a stage IV wound has healed. 2. He still has the area on his left hip and thigh. There is no open wound here however his x-ray showed heterotrophic calcifications. Integumentary (Hair, Skin) Wound #1 status is Healed -  Epithelialized. Original cause of wound was Gradually Appeared. The wound is located on the Midline Sacrum. The wound measures 0cm length x 0cm width x 0cm depth; 0cm^2 area and 0cm^3 volume. There is no tunneling or undermining noted. There is a none present amount of drainage noted. The wound margin is flat and intact. There is large (67-100%) red, hyper - granulation within the wound bed. There is no necrotic tissue within the wound bed. The periwound skin appearance exhibited: Scarring. The periwound skin appearance did not exhibit: Callus, Crepitus, Excoriation, Induration, Rash, Dry/Scaly, Maceration, Atrophie Blanche, Cyanosis, Ecchymosis, Hemosiderin Staining, Mottled, Pallor, Rubor, Erythema. Periwound temperature was noted as No Abnormality. Assessment Active Problems ICD-10 Pressure ulcer of sacral region, stage 4 Paraplegia, complete Plan Home Health: D/C Home [address] ROBI, Mitchell Dixon [place] ([id number]) Discharge From [facility] Services: Discharge from Wound Care Center - Treatment complete 1. The patient can be discharged 2. I have asked him to contact the local orthopedics doctor who referred him to [facility] to tell them that he has not been able to get the procedure done. I am really uncertain of the urgency here. Electronic Signature(s) Signed: 06/21/2018 3:44:07 PM By: Baltazar Najjar MD Entered By: Baltazar Najjar on 06/21/2018 10:50:28 Mitchell Dixon ([id number]) -------------------------------------------------------------------------------- SuperBill Details Patient  Name: Mitchell Dixon Date of Service: 06/21/2018 Medical Record Number: [id number] Patient Account Number: [id number] Date of Birth/Sex: January 07, 1956 (63 y.o. M) Treating RN: Huel Coventry Primary Care Provider: Karie Fetch Other Clinician: Referring Provider: Karie Fetch Treating Provider/Extender: Altamese [place] in Treatment: 12 Diagnosis Coding ICD-10 Codes Code Description L89.154  Pressure ulcer of sacral region, stage 4 G82.21 Paraplegia, complete Facility Procedures CPT4 Code: [id number] Description: [phone] - WOUND CARE VISIT-LEV 2 EST PT Modifier: Quantity: 1 Physician Procedures CPT4 Code: [id number] Description: 14239 - WC PHYS LEVEL 2 - EST PT ICD-10 Diagnosis Description L89.154 Pressure ulcer of sacral region, stage 4 G82.21 Paraplegia, complete Modifier: Quantity: 1 Electronic Signature(s) Signed: 06/21/2018 3:44:07 PM By: Baltazar Najjar MD Entered By: Baltazar Najjar on 06/21/2018 10:50:43

## 2018-06-26 NOTE — Progress Notes (Signed)
OCTAVIO, MATHENEY ([id number]) Visit Report for 06/21/2018 Arrival Information Details Patient Name: JENKINS, Mitchell Dixon Date of Service: 06/21/2018 10:00 AM Medical Record Number: [id number] Patient Account Number: [id number] Date of Birth/Sex: 1955/04/12 (63 y.o. M) Treating RN: Arnette Norris Primary Care Cheron Coryell: Karie Fetch Other Clinician: Referring Kaelan Emami: Karie Fetch Treating Charlina Dwight/Extender: Altamese [place] in Treatment: 12 Visit Information History Since Last Visit Added or deleted any medications: No Patient Arrived: Wheel Chair Any new allergies or adverse reactions: No Arrival Time: 10:07 Had a fall or experienced change in No Accompanied By: self activities of daily living that may affect Transfer Assistance: Transfer Board risk of falls: Patient Identification Verified: Yes Signs or symptoms of abuse/neglect since last visito No Secondary Verification Process Completed: Yes Hospitalized since last visit: No Has Dressing in Place as Prescribed: Yes Pain Present Now: No Electronic Signature(s) Signed: 06/26/2018 4:10:42 PM By: Arnette Norris Entered By: Arnette Norris on 06/21/2018 10:07:50 [name] ([id number]) -------------------------------------------------------------------------------- Clinic Level of Care Assessment Details Patient Name: [name] Date of Service: 06/21/2018 10:00 AM Medical Record Number: [id number] Patient Account Number: [id number] Date of Birth/Sex: 11-23-1955 (62 y.o. M) Treating RN: Huel Coventry Primary Care Yohannes Waibel: Karie Fetch Other Clinician: Referring Chariti Havel: Karie Fetch Treating Adger Cantera/Extender: Altamese [place] in Treatment: 12 Clinic Level of Care Assessment Items TOOL 4 Quantity Score  - Use when only an EandM is performed on FOLLOW-UP visit 0 ASSESSMENTS - Nursing Assessment / Reassessment  - Reassessment of Co-morbidities (includes updates in patient status) 0 X- 1  5 Reassessment of Adherence to Treatment Plan ASSESSMENTS - Wound and Skin Assessment / Reassessment X - Simple Wound Assessment / Reassessment - one wound 1 5  - 0 Complex Wound Assessment / Reassessment - multiple wounds  - 0 Dermatologic / Skin Assessment (not related to wound area) ASSESSMENTS - Focused Assessment  - Circumferential Edema Measurements - multi extremities 0  - 0 Nutritional Assessment / Counseling / Intervention  - 0 Lower Extremity Assessment (monofilament, tuning fork, pulses)  - 0 Peripheral Arterial Disease Assessment (using hand held doppler) ASSESSMENTS - Ostomy and/or Continence Assessment and Care  - Incontinence Assessment and Management 0  - 0 Ostomy Care Assessment and Management (repouching, etc.) PROCESS - Coordination of Care X - Simple Patient / Family Education for ongoing care 1 15  - 0 Complex (extensive) Patient / Family Education for ongoing care  - 0 Staff obtains Chiropractor, Records, Test Results / Process Orders  - 0 Staff telephones HHA, Nursing Homes / Clarify orders / etc  - 0 Routine Transfer to another Facility (non-emergent condition)  - 0 Routine Hospital Admission (non-emergent condition)  - 0 New Admissions / Manufacturing engineer / Ordering NPWT, Apligraf, etc.  - 0 Emergency Hospital Admission (emergent condition) X- 1 10 Simple Discharge Coordination KNOWLEDGE, ESCANDON ([id number])  - 0 Complex (extensive) Discharge Coordination PROCESS - Special Needs  - Pediatric / Minor Patient Management 0  - 0 Isolation Patient Management  - 0 Hearing / Language / Visual special needs  - 0 Assessment of Community assistance (transportation, D/C planning, etc.)  - 0 Additional assistance / Altered mentation  - 0 Support Surface(s) Assessment (bed, cushion, seat, etc.) INTERVENTIONS - Wound Cleansing / Measurement X - Simple Wound Cleansing - one wound 1 5  - 0 Complex  Wound Cleansing - multiple wounds X- 1 5 Wound Imaging (photographs - any number of wounds)  - 0 Wound Tracing (instead of photographs) X- 1 5 Simple  Wound Measurement - one wound []  - 0 Complex Wound Measurement - multiple wounds INTERVENTIONS - Wound Dressings []  - Small Wound Dressing one or multiple wounds 0 []  - 0 Medium Wound Dressing one or multiple wounds []  - 0 Large Wound Dressing one or multiple wounds []  - 0 Application of Medications - topical []  - 0 Application of Medications - injection INTERVENTIONS - Miscellaneous []  - External ear exam 0 []  - 0 Specimen Collection (cultures, biopsies, blood, body fluids, etc.) []  - 0 Specimen(s) / Culture(s) sent or taken to Lab for analysis []  - 0 Patient Transfer (multiple staff / Nurse, adult / Similar devices) []  - 0 Simple Staple / Suture removal (25 or less) []  - 0 Complex Staple / Suture removal (26 or more) []  - 0 Hypo / Hyperglycemic Management (close monitor of Blood Glucose) []  - 0 Ankle / Brachial Index (ABI) - do not check if billed separately X- 1 5 Vital Signs Boyar, Gwyn N. ([id number]) Has the patient been seen at the hospital within the last three years: Yes Total Score: 55 Level Of Care: New/Established - Level 2 Electronic Signature(s) Signed: 06/21/2018 4:31:15 PM By: Elliot Gurney, BSN, RN, CWS, Kim RN, BSN Entered By: Elliot Gurney, BSN, RN, CWS, Kim on 06/21/2018 10:24:45 [name] ([id number]) -------------------------------------------------------------------------------- Encounter Discharge Information Details Patient Name: [name] Date of Service: 06/21/2018 10:00 AM Medical Record Number: [id number] Patient Account Number: [id number] Date of Birth/Sex: 1956-02-02 (62 y.o. M) Treating RN: Huel Coventry Primary Care Tranisha Tissue: Karie Fetch Other Clinician: Referring Finnigan Warriner: Karie Fetch Treating Elisama Thissen/Extender: Altamese [place] in Treatment: 12 Encounter Discharge  Information Items Discharge Condition: Stable Ambulatory Status: Wheelchair Discharge Destination: Home Transportation: Private Auto Accompanied By: self Schedule Follow-up Appointment: Yes Clinical Summary of Care: Electronic Signature(s) Signed: 06/21/2018 4:31:15 PM By: Elliot Gurney, BSN, RN, CWS, Kim RN, BSN Entered By: Elliot Gurney, BSN, RN, CWS, Kim on 06/21/2018 10:25:42 [name] ([id number]) -------------------------------------------------------------------------------- Lower Extremity Assessment Details Patient Name: [name] Date of Service: 06/21/2018 10:00 AM Medical Record Number: [id number] Patient Account Number: [id number] Date of Birth/Sex: [birth year] (62 y.o. M) Treating RN: Arnette Norris Primary Care Gussie Towson: Karie Fetch Other Clinician: Referring Jasyn Mey: Karie Fetch Treating Larisa Lanius/Extender: Altamese [place] in Treatment: 12 Electronic Signature(s) Signed: 06/26/2018 4:10:42 PM By: Arnette Norris Entered By: Arnette Norris on 06/21/2018 10:13:27 [name] ([id number]) -------------------------------------------------------------------------------- Multi Wound Chart Details Patient Name: [name] Date of Service: 06/21/2018 10:00 AM Medical Record Number: [id number] Patient Account Number: [id number] Date of Birth/Sex: January 10, 1956 (63 y.o. M) Treating RN: Huel Coventry Primary Care Pooja Camuso: Karie Fetch Other Clinician: Referring Esmond Hinch: Karie Fetch Treating Susie Pousson/Extender: Altamese [place] in Treatment: 12 Vital Signs Height(in): 70 Pulse(bpm): 69 Weight(lbs): 140 Blood Pressure(mmHg): 133/48 Body Mass Index(BMI): 20 Temperature(F): 97.6 Respiratory Rate 19 (breaths/min): Photos: [N/A:N/A] Wound Location: Midline Sacrum N/A N/A Wounding Event: Gradually Appeared N/A N/A Primary Etiology: Pressure Ulcer N/A N/A Comorbid History: Arrhythmia, Deep Vein N/A N/A Thrombosis, Hypertension, History  of pressure wounds, Paraplegia Date Acquired: 03/08/2018 N/A N/A Weeks of Treatment: 12 N/A N/A Wound Status: Healed - Epithelialized N/A N/A Measurements L x W x D 0x0x0 N/A N/A (cm) Area (cm) : 0 N/A N/A Volume (cm) : 0 N/A N/A % Reduction in Area: 100.00% N/A N/A % Reduction in Volume: 100.00% N/A N/A Classification: Category/Stage IV N/A N/A Exudate Amount: None Present N/A N/A Wound Margin: Flat and Intact N/A N/A Granulation Amount: Large (67-100%) N/A N/A Granulation Quality: Red, Hyper-granulation N/A  N/A Necrotic Amount: None Present (0%) N/A N/A Exposed Structures: Fascia: No N/A N/A Fat Layer (Subcutaneous Tissue) Exposed: No Tendon: No Muscle: No Joint: No Bone: No Tippen, Hulbert N. ([id number]) Epithelialization: Large (67-100%) N/A N/A Periwound Skin Texture: Scarring: Yes N/A N/A Excoriation: No Induration: No Callus: No Crepitus: No Rash: No Periwound Skin Moisture: Maceration: No N/A N/A Dry/Scaly: No Periwound Skin Color: Atrophie Blanche: No N/A N/A Cyanosis: No Ecchymosis: No Erythema: No Hemosiderin Staining: No Mottled: No Pallor: No Rubor: No Temperature: No Abnormality N/A N/A Tenderness on Palpation: No N/A N/A Treatment Notes Electronic Signature(s) Signed: 06/21/2018 3:44:07 PM By: Baltazar Najjar MD Entered By: Baltazar Najjar on 06/21/2018 10:44:49 Pfefferkorn, [name] ([id number]) -------------------------------------------------------------------------------- Multi-Disciplinary Care Plan Details Patient Name: [name] Date of Service: 06/21/2018 10:00 AM Medical Record Number: [id number] Patient Account Number: [id number] Date of Birth/Sex: 10-30-1955 (62 y.o. M) Treating RN: Huel Coventry Primary Care Jamarious Febo: Karie Fetch Other Clinician: Referring Tyrrell Stephens: Karie Fetch Treating Ameliah Baskins/Extender: Altamese [place] in Treatment: 12 Active Inactive Electronic Signature(s) Signed: 06/21/2018 4:31:15 PM By:  Elliot Gurney, BSN, RN, CWS, Kim RN, BSN Entered By: Elliot Gurney, BSN, RN, CWS, Kim on 06/21/2018 10:21:38 [name] ([id number]) -------------------------------------------------------------------------------- Pain Assessment Details Patient Name: [name] Date of Service: 06/21/2018 10:00 AM Medical Record Number: [id number] Patient Account Number: [id number] Date of Birth/Sex: 05/21/1955 (62 y.o. M) Treating RN: Arnette Norris Primary Care Rhett Mutschler: Karie Fetch Other Clinician: Referring Kesia Dalto: Karie Fetch Treating Tarin Navarez/Extender: Altamese [place] in Treatment: 12 Active Problems Location of Pain Severity and Description of Pain Patient Has Paino No Site Locations Pain Management and Medication Current Pain Management: Electronic Signature(s) Signed: 06/26/2018 4:10:42 PM By: Arnette Norris Entered By: Arnette Norris on 06/21/2018 10:07:59 [name] ([id number]) -------------------------------------------------------------------------------- Patient/Caregiver Education Details Patient Name: [name] Date of Service: 06/21/2018 10:00 AM Medical Record Number: [id number] Patient Account Number: [id number] Date of Birth/Gender: 1956/02/09 (63 y.o. M) Treating RN: Huel Coventry Primary Care Physician: Karie Fetch Other Clinician: Referring Physician: Karie Fetch Treating Physician/Extender: Altamese [place] in Treatment: 12 Education Assessment Education Provided To: Patient Education Topics Provided Offloading: Handouts: Other: continue to keep pressure off Methods: Demonstration, Explain/Verbal Responses: State content correctly Electronic Signature(s) Signed: 06/21/2018 4:31:15 PM By: Elliot Gurney, BSN, RN, CWS, Kim RN, BSN Entered By: Elliot Gurney, BSN, RN, CWS, Kim on 06/21/2018 10:25:21 [name] ([id number]) -------------------------------------------------------------------------------- Wound Assessment Details Patient  Name: [name] Date of Service: 06/21/2018 10:00 AM Medical Record Number: [id number] Patient Account Number: [id number] Date of Birth/Sex: 1955/07/15 (62 y.o. M) Treating RN: Huel Coventry Primary Care Cristine Daw: Karie Fetch Other Clinician: Referring Issaih Kaus: Karie Fetch Treating Javad Salva/Extender: Altamese [place] in Treatment: 12 Wound Status Wound Number: 1 Primary Pressure Ulcer Etiology: Wound Location: Midline Sacrum Wound Healed - Epithelialized Wounding Event: Gradually Appeared Status: Date Acquired: 03/08/2018 Comorbid Arrhythmia, Deep Vein Thrombosis, Weeks Of Treatment: 12 History: Hypertension, History of pressure wounds, Clustered Wound: No Paraplegia Photos Wound Measurements Length: (cm) Width: (cm) Depth: (cm) Area: (cm) Volume: (cm) 0 % Reduction in Area: 100% 0 % Reduction in Volume: 100% 0 Epithelialization: Large (67-100%) 0 Tunneling: No 0 Undermining: No Wound Description Classification: Category/Stage IV Wound Margin: Flat and Intact Exudate Amount: None Present Foul Odor After Cleansing: No Slough/Fibrino No Wound Bed Granulation Amount: Large (67-100%) Exposed Structure Granulation Quality: Red, Hyper-granulation Fascia Exposed: No Necrotic Amount: None Present (0%) Fat Layer (Subcutaneous Tissue) Exposed: No Tendon Exposed: No Muscle Exposed: No Joint Exposed: No Bone  Exposed: No Periwound Skin Texture Texture Color No Abnormalities Noted: No No Abnormalities Noted: No Callus: No Atrophie Blanche: No Orson GearTERSON, Kiyoshi N. ([id number]) Crepitus: No Cyanosis: No Excoriation: No Ecchymosis: No Induration: No Erythema: No Rash: No Hemosiderin Staining: No Scarring: Yes Mottled: No Pallor: No Moisture Rubor: No No Abnormalities Noted: No Dry / Scaly: No Temperature / Pain Maceration: No Temperature: No Abnormality Electronic Signature(s) Signed: 06/21/2018 4:31:15 PM By: Elliot GurneyWoody, BSN, RN, CWS, Kim RN,  BSN Entered By: Elliot GurneyWoody, BSN, RN, CWS, Kim on 06/21/2018 10:21:17 Orson GearPATTERSON, Emidio N. ([id number]) -------------------------------------------------------------------------------- Vitals Details Patient Name: Orson GearPATTERSON, Wasyl N. Date of Service: 06/21/2018 10:00 AM Medical Record Number: [id number] Patient Account Number: [id number] Date of Birth/Sex: 01-Jan-1956 (62 y.o. M) Treating RN: Arnette NorrisBiell, Kristina Primary Care Brydon Spahr: Karie FetchAYCOCK, NGWE Other Clinician: Referring Natalin Bible: Karie FetchAYCOCK, NGWE Treating Ab Leaming/Extender: Altamese CarolinaOBSON, MICHAEL G Weeks in Treatment: 12 Vital Signs Time Taken: 10:05 Temperature (F): 97.6 Height (in): 70 Pulse (bpm): 69 Weight (lbs): 140 Respiratory Rate (breaths/min): 19 Body Mass Index (BMI): 20.1 Blood Pressure (mmHg): 133/48 Reference Range: 80 - 120 mg / dl Electronic Signature(s) Signed: 06/26/2018 4:10:42 PM By: Arnette NorrisBiell, Kristina Entered By: Arnette NorrisBiell, Kristina on 06/21/2018 10:08:17

## 2019-03-04 IMAGING — CT CT ANGIO AOBIFEM WO/W CM
1 of 10 series · 10 of 46 positions shown, 13 images · IV contrast (APPLIED)
Comparison: None.

ADDENDUM:
Pockets of gas in the anterior abdomen on sequence 4, 64 probably
represent intraluminal small bowel rather than free air. Small
pockets of gas in the central abdomen could also represent gas
within very decompressed loops of small bowel. No evidence for
portal venous gas.
CLINICAL DATA: 61-year-old with a cold right leg. Evaluate for
arterial embolism. Abdominal pain.

EXAM:
CT ANGIOGRAPHY OF ABDOMINAL AORTA WITH ILIOFEMORAL RUNOFF
TECHNIQUE: Multidetector CT imaging of the abdomen, pelvis and lower
extremities was performed using the standard protocol during bolus
administration of intravenous contrast. Multiplanar CT image
reconstructions and MIPs were obtained to evaluate the vascular
anatomy.
CONTRAST:  125mL [id number] IOPAMIDOL ([id number]) INJECTION 76%

[Series 5: axial arterial lower · axial · arterial · 0.75mm/px · z∈[-1371,-624]mm · 10 of 297 slices shown, 13 images]
[im 32/297  soft-tissue]
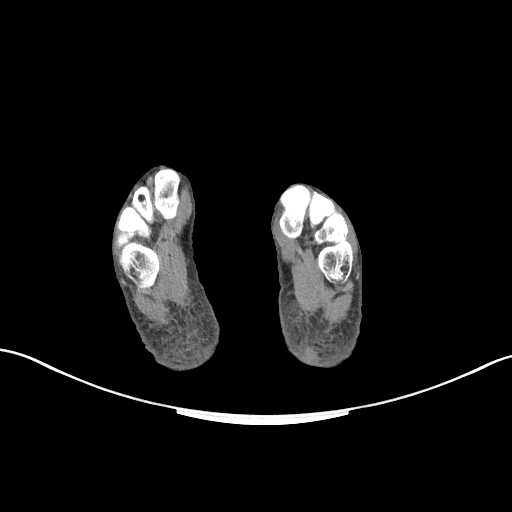
[im 32/297  bone]
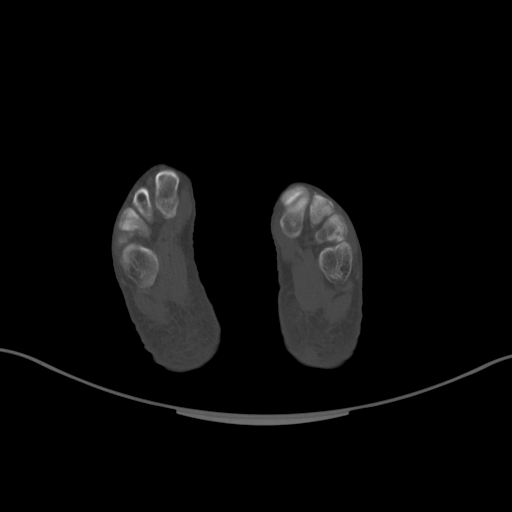
[im 63/297  soft-tissue]
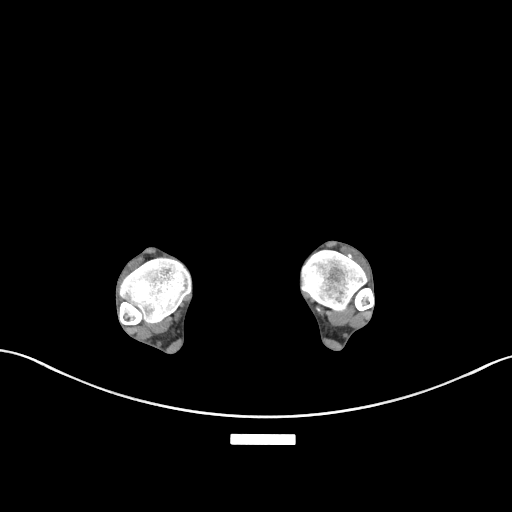
[im 94/297  soft-tissue]
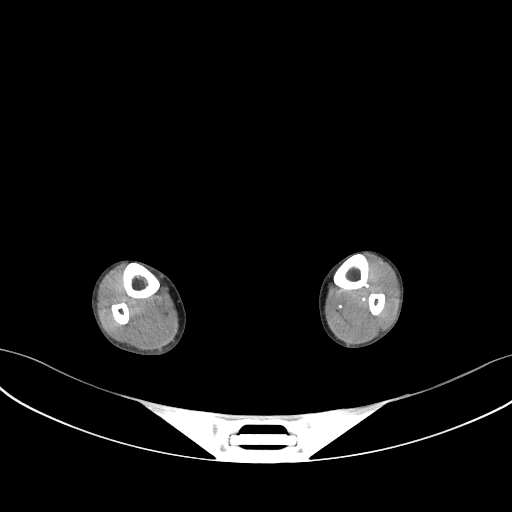
[im 125/297  soft-tissue]
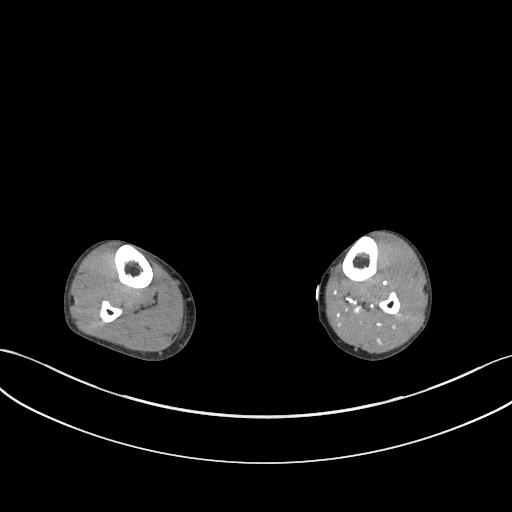
[im 172/297  soft-tissue]
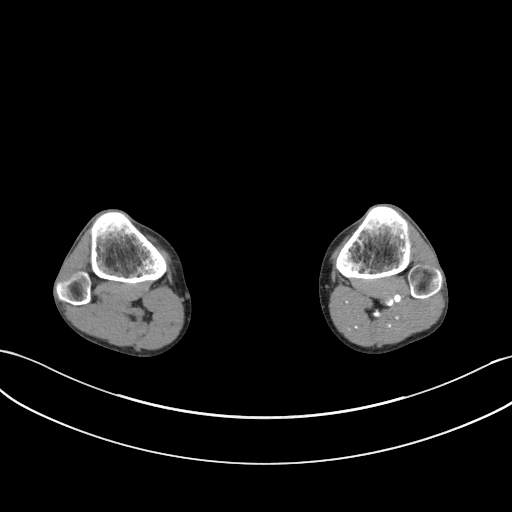
[im 203/297  soft-tissue]
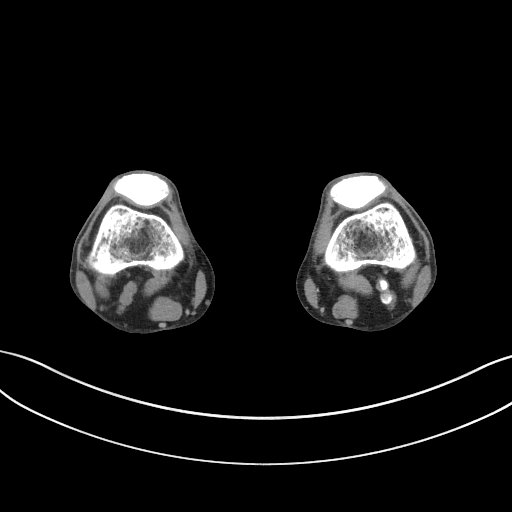
[im 234/297  soft-tissue]
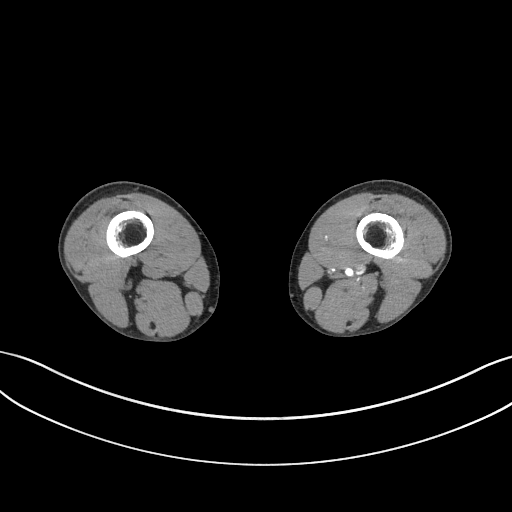
[im 234/297  lung]
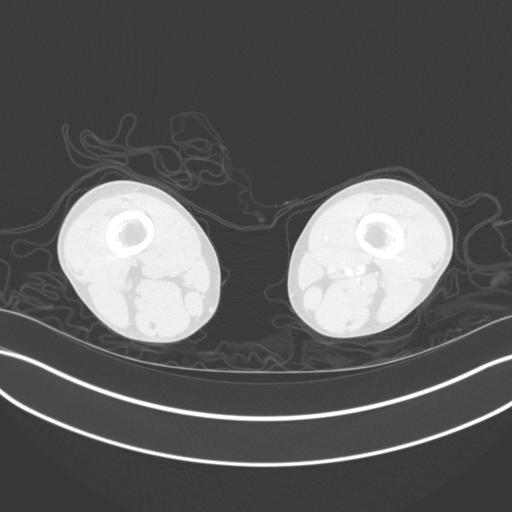
[im 250/297  lung]
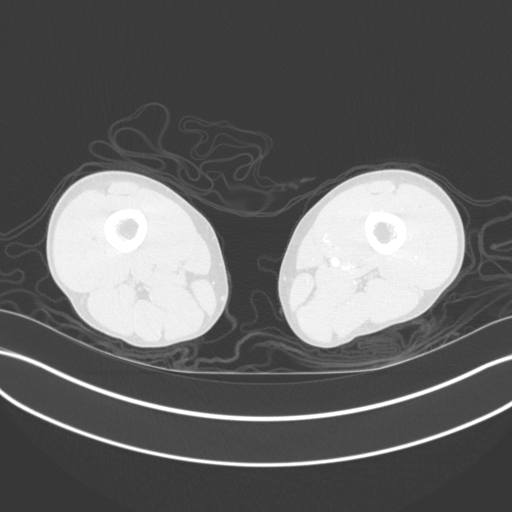
[im 265/297  soft-tissue]
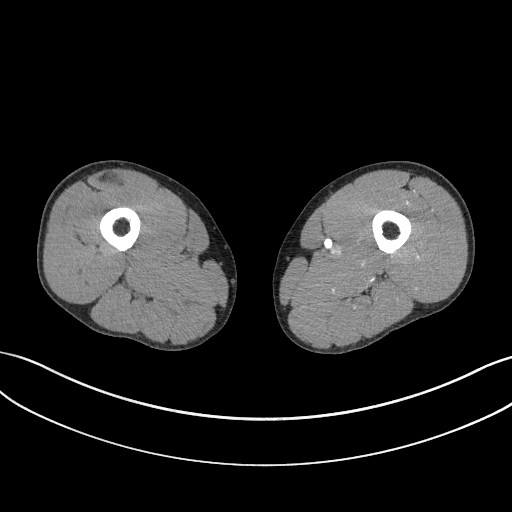
[im 265/297  lung]
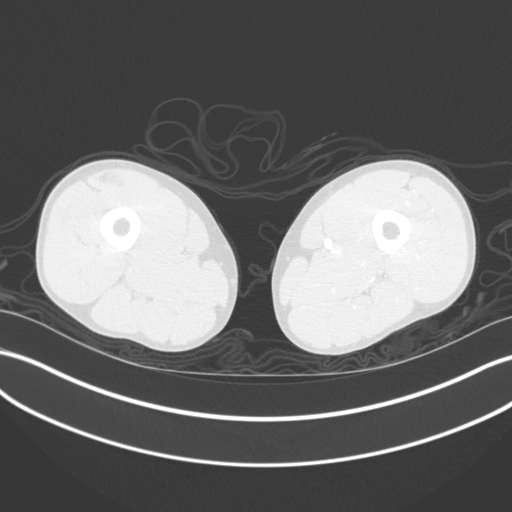
[im 281/297  lung]
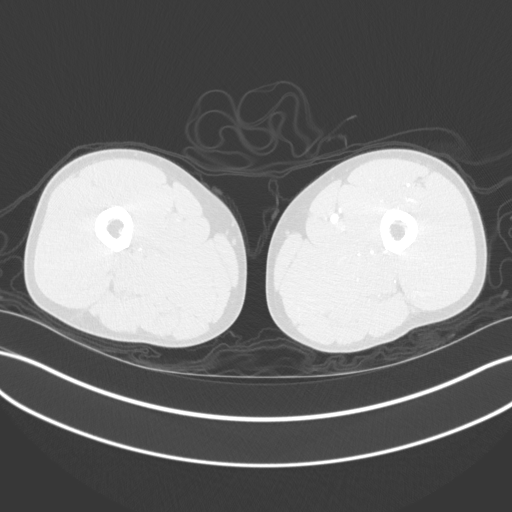

[10 of 46 positions shown; findings below may reference images not displayed]

FINDINGS: VASCULAR

Aorta: The distal descending thoracic aorta is mildly enlarged
measuring up to 3.7 cm with irregular mural thrombus along the
anterior wall. Irregular mural thrombus in the proximal abdominal
aorta at the level of the SMA. Findings most likely related to acute
thrombus but difficult to exclude an underlying dissection in the
aorta. Thrombus along the right side of the distal abdominal aorta.
Aorta at the level of the celiac trunk measures roughly 3.2 cm.
Infrarenal abdominal aorta measures 2.6 cm. Images at the aortic
root are unremarkable but limited evaluation of the thoracic aorta.

Celiac: Small filling defects and irregularity involving the celiac
trunk most likely represents thrombus but difficult to exclude
dissection flaps in this area. There is flow in the left gastric
artery, splenic artery and common hepatic artery. Distal splenic
artery is not visualized but the spleen is also very small.

SMA: Thrombus or dissection flap involving the origin of the SMA
originating from the right side of the abdominal aorta. The SMA is
patent. Main branch vessels have flow.

Renals: Right renal artery is completely occluded. There is no blood
flow in the right renal artery or right kidney. Irregular filling
defects in the proximal left renal artery compatible with thrombus.
High-grade narrowing of the mid left renal artery due to thrombus.

IMA: Origin of the IMA is thrombosed but there is distal
reconstitution of the IMA a few cm beyond the origin.

RIGHT Lower Extremity

Inflow: Thrombus along the right side of the distal abdominal aorta
extends into the right common iliac artery. There is no flow in the
right common or right external iliac artery. Question minimal
reconstitution in the right internal iliac arteries.

Outflow: Minimal reconstitution of the right common femoral artery
from the inferior epigastric artery. Small amount of flow in the
right profunda femoral artery. The proximal right SFA is thrombosed.
Minimal reconstitution of mid right SFA. Distal SFA is occluded. No
flow in the right popliteal artery.

Runoff: No flow in the right runoff vessels.

LEFT Lower Extremity

Inflow: Irregular mural thrombus involving the left common iliac
artery but the vessel is patent. Cannot exclude nonocclusive
thrombus or dissection involving the left common iliac artery.
Proximal left internal iliac artery is thrombosed with some distal
reconstitution. Left external iliac artery has a dissection flap or
wall based thrombus.

Outflow: Dissection flap or stringy thrombus extending into the left
common femoral artery. Left profunda femoral artery is patent. Left
SFA is patent. Left popliteal artery is patent.

Runoff: Three-vessel runoff. Cannot exclude distal thrombus in the
peroneal artery at the ankle.

Veins: No obvious venous abnormality within the limitations of this
arterial phase study.

Review of the MIP images confirms the above findings.

NON-VASCULAR

Lower chest: Centrilobular emphysema at the lung bases. No large
pleural effusions.

Hepatobiliary: No acute abnormality to the liver or gallbladder.

Pancreas: No significant enhancement in the pancreas on this phase
of imaging. There may be fluid along the posterior aspect of the
pancreatic body and tail region.

Spleen: Spleen is small without any significant enhancement. Cannot
exclude distal occlusion of the splenic artery.

Adrenals/Urinary Tract: Adrenal glands are within normal limits. No
flow in the right kidney. Enhancement in the left kidney but there
is a striated enhancement pattern along the periphery of the
kidneys. Negative for hydronephrosis. Urinary bladder is
decompressed.

Stomach/Bowel: Normal appendix. Irregular small foci of gas in the
central mesentery area. An example of this is on sequence 4, image
76 just anterior to the aorta. These small gas pockets connect with
a larger pocket in the upper abdomen on sequence 4, 66. There is
also concern for an abnormal gas pocket in the right anterior
abdomen on image 64 just posterior to the colon. Findings are
concerning for pneumatosis and possibly free air. Cannot exclude
pneumatosis in small bowel loops in the lower abdomen and pelvis.
There is no free air near the hemidiaphragms or liver. Mild
dilatation of proximal small bowel loops. No evidence for bowel
obstruction. Gas at the gastric fundus is probably intraluminal but
has an unusual configuration.

Lymphatic: No significant lymph node enlargement in the abdomen or
pelvis.

Reproductive: Prostate is unremarkable.

Other: Trace free fluid in the pelvis.

Musculoskeletal: No suspicious bone findings.
IMPRESSION: VASCULAR

Multifocal arterial emboli.

Arterial emboli throughout the right lower extremity starting at the
right common iliac artery. No significant flow to the right lower
extremity with exception of minimal flow in the right profunda
femoral arteries.

Complete occlusion of the right renal artery. Thrombus in the left
renal artery with a critical stenosis in the mid left renal artery.

Nonocclusive thrombus involving proximal SMA and celiac trunk.
Proximal occlusion of the IMA.

Thrombus in the left external iliac artery and left common femoral
artery. No significant arterial occlusion in the left lower
extremity. Thrombus in the left internal iliac artery.

Ectasia of the thoracoabdominal aorta with irregular mural thrombus
particularly along the anterior right side of the aorta. Findings
could be secondary to arterial emboli/thrombus but difficult to
exclude an underlying aortic dissection.

NON-VASCULAR

Unusual appearance of the bowel structures. Difficult to exclude
pneumatosis and gas within the mesentery. No large pocket of free
air.

No arterial flow to the right kidney.

Striated enhancement pattern in the left kidney compatible with
arterial emboli in the left renal artery.

Limited evaluation of the splenic arterial flow and enhancement.

Critical Value/emergent results were called by telephone at the time
of interpretation on 02/23/2017 at [DATE] to Dr. FESSY K UHEUA
, who verbally acknowledged these results.

The results were also discussed with Dr. Tinen of Vascular surgery

## 2022-03-20 ENCOUNTER — Other Ambulatory Visit: Payer: Self-pay

## 2022-03-20 ENCOUNTER — Encounter: Payer: Self-pay | Admitting: Emergency Medicine

## 2022-03-20 ENCOUNTER — Inpatient Hospital Stay
Admission: EM | Admit: 2022-03-20 | Discharge: 2022-03-27 | DRG: 871 | Disposition: A | Discharge status: Home / Self Care | Payer: Medicaid Other | Attending: Internal Medicine | Admitting: Internal Medicine

## 2022-03-20 ENCOUNTER — Emergency Department: Payer: Medicaid Other

## 2022-03-20 DIAGNOSIS — F17210 Nicotine dependence, cigarettes, uncomplicated: Secondary | ICD-10-CM | POA: Diagnosis present

## 2022-03-20 DIAGNOSIS — Z8249 Family history of ischemic heart disease and other diseases of the circulatory system: Secondary | ICD-10-CM

## 2022-03-20 DIAGNOSIS — E782 Mixed hyperlipidemia: Secondary | ICD-10-CM | POA: Diagnosis present

## 2022-03-20 DIAGNOSIS — I4891 Unspecified atrial fibrillation: Secondary | ICD-10-CM

## 2022-03-20 DIAGNOSIS — I739 Peripheral vascular disease, unspecified: Secondary | ICD-10-CM | POA: Diagnosis present

## 2022-03-20 DIAGNOSIS — R531 Weakness: Secondary | ICD-10-CM | POA: Diagnosis not present

## 2022-03-20 DIAGNOSIS — Z7901 Long term (current) use of anticoagulants: Secondary | ICD-10-CM

## 2022-03-20 DIAGNOSIS — N261 Atrophy of kidney (terminal): Secondary | ICD-10-CM | POA: Diagnosis not present

## 2022-03-20 DIAGNOSIS — Z1152 Encounter for screening for COVID-19: Secondary | ICD-10-CM | POA: Diagnosis not present

## 2022-03-20 DIAGNOSIS — I480 Paroxysmal atrial fibrillation: Secondary | ICD-10-CM | POA: Diagnosis present

## 2022-03-20 DIAGNOSIS — N390 Urinary tract infection, site not specified: Secondary | ICD-10-CM | POA: Diagnosis present

## 2022-03-20 DIAGNOSIS — Z79899 Other long term (current) drug therapy: Secondary | ICD-10-CM | POA: Diagnosis not present

## 2022-03-20 DIAGNOSIS — S34119S Complete lesion of unspecified level of lumbar spinal cord, sequela: Secondary | ICD-10-CM

## 2022-03-20 DIAGNOSIS — R319 Hematuria, unspecified: Secondary | ICD-10-CM | POA: Diagnosis present

## 2022-03-20 DIAGNOSIS — G8221 Paraplegia, complete: Secondary | ICD-10-CM | POA: Diagnosis present

## 2022-03-20 DIAGNOSIS — I10 Essential (primary) hypertension: Secondary | ICD-10-CM | POA: Diagnosis not present

## 2022-03-20 DIAGNOSIS — N210 Calculus in bladder: Secondary | ICD-10-CM | POA: Diagnosis not present

## 2022-03-20 DIAGNOSIS — N200 Calculus of kidney: Secondary | ICD-10-CM | POA: Diagnosis present

## 2022-03-20 DIAGNOSIS — N319 Neuromuscular dysfunction of bladder, unspecified: Secondary | ICD-10-CM

## 2022-03-20 DIAGNOSIS — E876 Hypokalemia: Secondary | ICD-10-CM | POA: Diagnosis present

## 2022-03-20 DIAGNOSIS — E872 Acidosis, unspecified: Secondary | ICD-10-CM | POA: Diagnosis present

## 2022-03-20 DIAGNOSIS — N179 Acute kidney failure, unspecified: Secondary | ICD-10-CM | POA: Diagnosis present

## 2022-03-20 DIAGNOSIS — D649 Anemia, unspecified: Secondary | ICD-10-CM | POA: Diagnosis present

## 2022-03-20 DIAGNOSIS — J439 Emphysema, unspecified: Secondary | ICD-10-CM | POA: Diagnosis present

## 2022-03-20 DIAGNOSIS — J189 Pneumonia, unspecified organism: Secondary | ICD-10-CM

## 2022-03-20 DIAGNOSIS — Z7951 Long term (current) use of inhaled steroids: Secondary | ICD-10-CM

## 2022-03-20 DIAGNOSIS — D500 Iron deficiency anemia secondary to blood loss (chronic): Secondary | ICD-10-CM | POA: Diagnosis present

## 2022-03-20 DIAGNOSIS — I5022 Chronic systolic (congestive) heart failure: Secondary | ICD-10-CM | POA: Diagnosis present

## 2022-03-20 DIAGNOSIS — Z8679 Personal history of other diseases of the circulatory system: Secondary | ICD-10-CM

## 2022-03-20 DIAGNOSIS — I160 Hypertensive urgency: Secondary | ICD-10-CM | POA: Diagnosis present

## 2022-03-20 DIAGNOSIS — R6520 Severe sepsis without septic shock: Secondary | ICD-10-CM | POA: Diagnosis not present

## 2022-03-20 DIAGNOSIS — I951 Orthostatic hypotension: Secondary | ICD-10-CM | POA: Diagnosis present

## 2022-03-20 DIAGNOSIS — I110 Hypertensive heart disease with heart failure: Secondary | ICD-10-CM | POA: Diagnosis present

## 2022-03-20 DIAGNOSIS — Z950 Presence of cardiac pacemaker: Secondary | ICD-10-CM | POA: Diagnosis not present

## 2022-03-20 DIAGNOSIS — A419 Sepsis, unspecified organism: Secondary | ICD-10-CM | POA: Diagnosis present

## 2022-03-20 DIAGNOSIS — Z8673 Personal history of transient ischemic attack (TIA), and cerebral infarction without residual deficits: Secondary | ICD-10-CM

## 2022-03-20 HISTORY — DX: Dissection of ascending aorta: I71.010

## 2022-03-20 HISTORY — DX: Peripheral vascular disease, unspecified: I73.9

## 2022-03-20 HISTORY — DX: Unspecified urinary incontinence: R32

## 2022-03-20 HISTORY — DX: Chronic systolic (congestive) heart failure: I50.22

## 2022-03-20 HISTORY — DX: Presence of cardiac pacemaker: Z95.0

## 2022-03-20 HISTORY — DX: Paraplegia, unspecified: G82.20

## 2022-03-20 HISTORY — DX: Mixed hyperlipidemia: E78.2

## 2022-03-20 HISTORY — DX: Paroxysmal atrial fibrillation: I48.0

## 2022-03-20 HISTORY — DX: Intracardiac thrombosis, not elsewhere classified: I51.3

## 2022-03-20 LAB — RESP PANEL BY RT-PCR (RSV, FLU A&B, COVID)  RVPGX2
Influenza A by PCR: NEGATIVE
Influenza B by PCR: NEGATIVE
Resp Syncytial Virus by PCR: NEGATIVE
SARS Coronavirus 2 by RT PCR: NEGATIVE

## 2022-03-20 LAB — CBC WITH DIFFERENTIAL/PLATELET
Abs Immature Granulocytes: 0.24 10*3/uL — ABNORMAL HIGH (ref 0.00–0.07)
Basophils Absolute: 0.2 10*3/uL — ABNORMAL HIGH (ref 0.0–0.1)
Basophils Relative: 1 %
Eosinophils Absolute: 0 10*3/uL (ref 0.0–0.5)
Eosinophils Relative: 0 %
HCT: 40 % (ref 39.0–52.0)
Hemoglobin: 12.4 g/dL — ABNORMAL LOW (ref 13.0–17.0)
Immature Granulocytes: 1 %
Lymphocytes Relative: 3 %
Lymphs Abs: 0.8 10*3/uL (ref 0.7–4.0)
MCH: 29.3 pg (ref 26.0–34.0)
MCHC: 31 g/dL (ref 30.0–36.0)
MCV: 94.6 fL (ref 80.0–100.0)
Monocytes Absolute: 0.7 10*3/uL (ref 0.1–1.0)
Monocytes Relative: 3 %
Neutro Abs: 24.8 10*3/uL — ABNORMAL HIGH (ref 1.7–7.7)
Neutrophils Relative %: 92 %
Platelets: 186 10*3/uL (ref 150–400)
RBC: 4.23 MIL/uL (ref 4.22–5.81)
RDW: 14.7 % (ref 11.5–15.5)
Smear Review: NORMAL
WBC: 26.7 10*3/uL — ABNORMAL HIGH (ref 4.0–10.5)
nRBC: 0 % (ref 0.0–0.2)

## 2022-03-20 LAB — COMPREHENSIVE METABOLIC PANEL
ALT: 12 U/L (ref 0–44)
AST: 33 U/L (ref 15–41)
Albumin: 3 g/dL — ABNORMAL LOW (ref 3.5–5.0)
Alkaline Phosphatase: 129 U/L — ABNORMAL HIGH (ref 38–126)
Anion gap: 13 (ref 5–15)
BUN: 27 mg/dL — ABNORMAL HIGH (ref 8–23)
CO2: 20 mmol/L — ABNORMAL LOW (ref 22–32)
Calcium: 8.4 mg/dL — ABNORMAL LOW (ref 8.9–10.3)
Chloride: 100 mmol/L (ref 98–111)
Creatinine, Ser: 1.5 mg/dL — ABNORMAL HIGH (ref 0.61–1.24)
GFR, Estimated: 52 mL/min — ABNORMAL LOW (ref >60)
Glucose, Bld: 98 mg/dL (ref 70–99)
Potassium: 3 mmol/L — ABNORMAL LOW (ref 3.5–5.1)
Sodium: 133 mmol/L — ABNORMAL LOW (ref 135–145)
Total Bilirubin: 2.3 mg/dL — ABNORMAL HIGH (ref 0.3–1.2)
Total Protein: 7.6 g/dL (ref 6.5–8.1)

## 2022-03-20 LAB — LACTIC ACID, PLASMA
Lactic Acid, Venous: 3.7 mmol/L (ref 0.5–1.9)
Lactic Acid, Venous: 2.3 mmol/L (ref 0.5–1.9)

## 2022-03-20 LAB — URINALYSIS, ROUTINE W REFLEX MICROSCOPIC
RBC / HPF: >50 RBC/hpf — ABNORMAL HIGH (ref 0–5)
Specific Gravity, Urine: 1.023 (ref 1.005–1.030)
Squamous Epithelial / HPF: NONE SEEN /HPF (ref 0–5)
WBC, UA: >50 WBC/hpf — ABNORMAL HIGH (ref 0–5)

## 2022-03-20 LAB — TROPONIN I (HIGH SENSITIVITY)
Troponin I (High Sensitivity): 13 ng/L (ref <18)
Troponin I (High Sensitivity): 22 ng/L — ABNORMAL HIGH (ref <18)

## 2022-03-20 LAB — LIPASE, BLOOD: Lipase: 31 U/L (ref 11–51)

## 2022-03-20 MED ORDER — IOHEXOL 300 MG/ML  SOLN
100.0000 mL | Freq: Once | INTRAMUSCULAR | Status: AC | PRN
  Administered 2022-03-20: 100 mL via INTRAVENOUS

## 2022-03-20 MED ORDER — GUAIFENESIN ER 600 MG PO TB12
600.0000 mg | ORAL_TABLET | Freq: Two times a day (BID) | ORAL | Status: DC
  Administered 2022-03-21 – 2022-03-27 (×14): 600 mg via ORAL
  Filled 2022-03-20 (×14): qty 1

## 2022-03-20 MED ORDER — SODIUM CHLORIDE 0.9 % IV SOLN: 2.0000 g | INTRAVENOUS | Status: DC

## 2022-03-20 MED ORDER — ONDANSETRON HCL 4 MG/2ML IJ SOLN: 4.0000 mg | Freq: Four times a day (QID) | INTRAMUSCULAR | Status: DC | PRN

## 2022-03-20 MED ORDER — SODIUM CHLORIDE 0.9 % IV SOLN
1.0000 g | Freq: Once | INTRAVENOUS | Status: AC
  Administered 2022-03-21: 1 g via INTRAVENOUS
  Filled 2022-03-20: qty 10

## 2022-03-20 MED ORDER — SODIUM CHLORIDE 0.9 % IV SOLN
1.0000 g | Freq: Once | INTRAVENOUS | Status: AC
  Administered 2022-03-20: 1 g via INTRAVENOUS
  Filled 2022-03-20: qty 10

## 2022-03-20 MED ORDER — SODIUM CHLORIDE 0.9 % IV SOLN
2.0000 g | INTRAVENOUS | Status: DC
  Administered 2022-03-21 – 2022-03-24 (×4): 2 g via INTRAVENOUS
  Filled 2022-03-20 (×4): qty 20

## 2022-03-20 MED ORDER — POTASSIUM CHLORIDE CRYS ER 20 MEQ PO TBCR
40.0000 meq | EXTENDED_RELEASE_TABLET | Freq: Once | ORAL | Status: AC
  Administered 2022-03-21: 40 meq via ORAL
  Filled 2022-03-20: qty 2

## 2022-03-20 MED ORDER — ONDANSETRON HCL 4 MG PO TABS: 4.0000 mg | ORAL_TABLET | Freq: Four times a day (QID) | ORAL | Status: DC | PRN

## 2022-03-20 MED ORDER — ONDANSETRON HCL 4 MG/2ML IJ SOLN
4.0000 mg | Freq: Once | INTRAMUSCULAR | Status: AC
  Administered 2022-03-20: 4 mg via INTRAVENOUS
  Filled 2022-03-20: qty 2

## 2022-03-20 MED ORDER — SODIUM CHLORIDE 0.9 % IV BOLUS
1000.0000 mL | Freq: Once | INTRAVENOUS | Status: AC
  Administered 2022-03-20: 1000 mL via INTRAVENOUS

## 2022-03-20 MED ORDER — SODIUM CHLORIDE 0.9 % IV SOLN
500.0000 mg | INTRAVENOUS | Status: DC
  Administered 2022-03-21 – 2022-03-22 (×2): 500 mg via INTRAVENOUS
  Filled 2022-03-20 (×3): qty 5

## 2022-03-20 MED ORDER — LACTATED RINGERS IV SOLN: INTRAVENOUS | Status: AC

## 2022-03-20 MED ORDER — ALBUTEROL SULFATE (2.5 MG/3ML) 0.083% IN NEBU: 2.5000 mg | INHALATION_SOLUTION | RESPIRATORY_TRACT | Status: DC | PRN

## 2022-03-20 MED ORDER — HYDROCODONE-ACETAMINOPHEN 5-325 MG PO TABS
1.0000 | ORAL_TABLET | ORAL | Status: DC | PRN
  Administered 2022-03-24 (×2): 1 via ORAL
  Filled 2022-03-20 (×2): qty 1

## 2022-03-20 MED ORDER — ACETAMINOPHEN 650 MG RE SUPP: 650.0000 mg | Freq: Four times a day (QID) | RECTAL | Status: DC | PRN

## 2022-03-20 MED ORDER — SODIUM CHLORIDE 0.9 % IV SOLN
500.0000 mg | Freq: Once | INTRAVENOUS | Status: AC
  Administered 2022-03-20: 500 mg via INTRAVENOUS
  Filled 2022-03-20: qty 5

## 2022-03-20 MED ORDER — ACETAMINOPHEN 325 MG PO TABS
650.0000 mg | ORAL_TABLET | Freq: Four times a day (QID) | ORAL | Status: DC | PRN
  Administered 2022-03-21: 650 mg via ORAL
  Filled 2022-03-20: qty 2

## 2022-03-20 NOTE — Assessment & Plan Note (Signed)
Does not appear to be on any medication at this time

## 2022-03-20 NOTE — Assessment & Plan Note (Signed)
Creatinine 1.5, presumed AKI Monitor response to fluid resuscitation for management of sepsis

## 2022-03-20 NOTE — Assessment & Plan Note (Signed)
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 Bridgett Larsson was consulted from ED and said stable findings Can consider inpatient vascular consult to follow versus outpatient referral relents

## 2022-03-20 NOTE — Assessment & Plan Note (Addendum)
No acute issues suspected however last documented check on Care Everywhere appears to have been over a year ago 10/2020 at [facility] for pacemaker check

## 2022-03-20 NOTE — Assessment & Plan Note (Signed)
Rocephin and azithromycin Antitussives, albuterol are all as needed, incentive spirometer Supplemental oxygen if needed

## 2022-03-20 NOTE — ED Notes (Signed)
MD notified of critical lactic acid value of 3.7

## 2022-03-20 NOTE — ED Triage Notes (Signed)
Pt in via EMS from home. Pt is a paraplegic. EMS was called for SOB and had a cough since Wednesday. Pt also with decreased food intake and increased weakness. Pt sister is en route to the ED and drives a handicapped Lucianne Lei to transport her brother.   HR 64, 98% RA, 123/71 97.9, CBG 106

## 2022-03-20 NOTE — Assessment & Plan Note (Signed)
Normotensive.  Will hold off on antihypertensives in the setting of sepsis

## 2022-03-20 NOTE — ED Notes (Signed)
Transfer of care report given to [facility] RN at beside.  Both RN's changed pt brief and bedding when transferred to hospital bed.

## 2022-03-20 NOTE — Assessment & Plan Note (Signed)
Potassium of 3 Oral repletion and monitor

## 2022-03-20 NOTE — ED Provider Notes (Signed)
[facility] Provider Note    Event Date/Time   First MD Initiated Contact with Patient 03/20/22 1820     (approximate)   History   Nausea, Emesis, Cough, and Weakness   HPI  Mitchell Dixon is a 67 y.o. male   Past medical history of ascending aortic dissection status post repair, paraplegia, hypertension, presents to the emergency department with generalized weakness, fatigue, cough productive, shortness of breath poor p.o. intake.  This been going on for the last several days.  He felt nauseated and vomited once in the emergency department.  No other acute medical complaints.  Independent Historian contributed to assessment above: Patient's brother.  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      Physical Exam   Triage Vital Signs: ED Triage Vitals [03/20/22 1808]  Enc Vitals Group     BP (!) 126/93     Pulse Rate (!) 104     Resp 18     Temp 97.9 F (36.6 C)     Temp Source Oral     SpO2 95 %     Weight 200 lb (90.7 kg)     Height 5\' 10"  (1.778 m)     Head Circumference      Peak Flow      Pain Score 0     Pain Loc      Pain Edu?      Excl. in [place]?     Most recent vital signs: Vitals:   03/20/22 2230 03/20/22 2332  BP: 133/86 115/89  Pulse: 100 (!) 108  Resp: (!) 25 (!) 23  Temp:  97.8 F (36.6 C)  SpO2: 96% 93%    General: Awake, no distress.  CV:  Good peripheral perfusion.  Resp:  Normal effort.  Abd:  No distention.  Other:  Awake alert cooperative pleasant, tachycardic and normotensive without a fever.  Abdomen soft and nontender.  Lungs coarse.  Bilaterally.   ED Results / Procedures / Treatments   Labs (all labs ordered are listed, but only abnormal results are displayed) Labs Reviewed  COMPREHENSIVE METABOLIC PANEL - Abnormal; Notable for the following  components:      Result Value   Sodium 133 (*)    Potassium 3.0 (*)    CO2 20 (*)    BUN 27 (*)    Creatinine, Ser 1.50 (*)    Calcium 8.4 (*)    Albumin 3.0 (*)    Alkaline Phosphatase 129 (*)    Total Bilirubin 2.3 (*)    GFR, Estimated 52 (*)    All other components within normal limits  LACTIC ACID, PLASMA - Abnormal; Notable for the following components:   Lactic Acid, Venous 3.7 (*)    All other components within normal limits  LACTIC ACID, PLASMA - Abnormal; Notable for the following components:   Lactic Acid, Venous 2.3 (*)    All other components within normal limits  CBC WITH DIFFERENTIAL/PLATELET - Abnormal; Notable for the following components:   WBC 26.7 (*)    Hemoglobin 12.4 (*)    Neutro Abs 24.8 (*)    Basophils Absolute 0.2 (*)    Abs Immature Granulocytes 0.24 (*)    All other components within normal limits  URINALYSIS, ROUTINE W REFLEX MICROSCOPIC - Abnormal; Notable for the following components:   Color, Urine RED (*)  APPearance TURBID (*)    Glucose, UA   (*)    Value: TEST NOT REPORTED DUE TO COLOR INTERFERENCE OF URINE PIGMENT   Hgb urine dipstick   (*)    Value: TEST NOT REPORTED DUE TO COLOR INTERFERENCE OF URINE PIGMENT   Bilirubin Urine   (*)    Value: TEST NOT REPORTED DUE TO COLOR INTERFERENCE OF URINE PIGMENT   Ketones, ur   (*)    Value: TEST NOT REPORTED DUE TO COLOR INTERFERENCE OF URINE PIGMENT   Protein, ur   (*)    Value: TEST NOT REPORTED DUE TO COLOR INTERFERENCE OF URINE PIGMENT   Nitrite   (*)    Value: TEST NOT REPORTED DUE TO COLOR INTERFERENCE OF URINE PIGMENT   Leukocytes,Ua   (*)    Value: TEST NOT REPORTED DUE TO COLOR INTERFERENCE OF URINE PIGMENT   RBC / HPF >50 (*)    WBC, UA >50 (*)    Bacteria, UA MANY (*)    All other components within normal limits  TROPONIN I (HIGH SENSITIVITY) - Abnormal; Notable for the following components:   Troponin I (High Sensitivity) 22 (*)    All other components within normal limits   RESP PANEL BY RT-PCR (RSV, FLU A&B, COVID)  RVPGX2  CULTURE, BLOOD (ROUTINE X 2)  URINE CULTURE  CULTURE, BLOOD (ROUTINE X 2) W REFLEX TO ID PANEL  LIPASE, BLOOD  PROTIME-INR  CORTISOL-AM, BLOOD  PROCALCITONIN  CBC  BASIC METABOLIC PANEL  HIV ANTIBODY (ROUTINE TESTING W REFLEX)  TROPONIN I (HIGH SENSITIVITY)     I ordered and reviewed the above labs they are notable for bacteria in the urine, white blood cell count markedly elevated at 20+ and a creatinine of 1.5  EKG  ED ECG REPORT I, Pilar Jarvis, the attending physician, personally viewed and interpreted this ECG.   Date: 03/20/2022  EKG Time: 1753  Rate: 92  Rhythm: nsr  Axis: nl  Intervals:none  ST&T Change: No acute ischemic changes    RADIOLOGY I independently reviewed and interpreted chest x-ray and see opacification right lung field concerning for pneumonia   PROCEDURES:  Critical Care performed: Yes, see critical care procedure note(s)  .Critical Care  Performed by: Pilar Jarvis, MD Authorized by: Pilar Jarvis, MD   Critical care provider statement:    Critical care time (minutes):  30   Critical care was necessary to treat or prevent imminent or life-threatening deterioration of the following conditions:  Sepsis   Critical care was time spent personally by me on the following activities:  Development of treatment plan with patient or surrogate, discussions with consultants, evaluation of patient's response to treatment, examination of patient, ordering and review of laboratory studies, ordering and review of radiographic studies, ordering and performing treatments and interventions, pulse oximetry, re-evaluation of patient's condition and review of old charts    MEDICATIONS ORDERED IN ED: Medications  lactated ringers infusion (has no administration in time range)  azithromycin (ZITHROMAX) 500 mg in sodium chloride 0.9 % 250 mL IVPB (has no administration in time range)  acetaminophen (TYLENOL) tablet  650 mg (has no administration in time range)    Or  acetaminophen (TYLENOL) suppository 650 mg (has no administration in time range)  HYDROcodone-acetaminophen (NORCO/VICODIN) 5-325 MG per tablet 1-2 tablet (has no administration in time range)  ondansetron (ZOFRAN) tablet 4 mg (has no administration in time range)    Or  ondansetron (ZOFRAN) injection 4 mg (has no administration in time range)  guaiFENesin (MUCINEX) 12 hr tablet 600 mg (600 mg Oral Given 03/21/22 0009)  albuterol (PROVENTIL) (2.5 MG/3ML) 0.083% nebulizer solution 2.5 mg (has no administration in time range)  cefTRIAXone (ROCEPHIN) 2 g in sodium chloride 0.9 % 100 mL IVPB (has no administration in time range)  cefTRIAXone (ROCEPHIN) 1 g in sodium chloride 0.9 % 100 mL IVPB (1 g Intravenous New Bag/Given 03/21/22 0009)  sodium chloride 0.9 % bolus 1,000 mL (0 mLs Intravenous Stopped 03/20/22 2030)  ondansetron (ZOFRAN) injection 4 mg (4 mg Intravenous Given 03/20/22 1922)  cefTRIAXone (ROCEPHIN) 1 g in sodium chloride 0.9 % 100 mL IVPB (0 g Intravenous Stopped 03/20/22 2156)  azithromycin (ZITHROMAX) 500 mg in sodium chloride 0.9 % 250 mL IVPB (0 mg Intravenous Stopped 03/20/22 2331)  iohexol (OMNIPAQUE) 300 MG/ML solution 100 mL (100 mLs Intravenous Contrast Given 03/20/22 2055)  sodium chloride 0.9 % bolus 1,000 mL (0 mLs Intravenous Stopped 03/20/22 2331)  potassium chloride SA (KLOR-CON M) CR tablet 40 mEq (40 mEq Oral Given 03/21/22 0009)    External physician / consultants:  I spoke with hospitalist for admission and regarding care plan for this patient.   IMPRESSION / MDM / ASSESSMENT AND PLAN / ED COURSE  I reviewed the triage vital signs and the nursing notes.                                Patient's presentation is most consistent with acute presentation with potential threat to life or bodily function.  Differential diagnosis includes, but is not limited to, sepsis, urinary tract infection, respiratory infection,  intra-abdominal infection, electrolyte derangement, ACS   The patient is on the cardiac monitor to evaluate for evidence of arrhythmia and/or significant heart rate changes.  MDM: This is a patient with cough found to have evidence of pneumonia on imaging along with elevated white blood cell count and lactic acidosis was initially tachycardic concern for sepsis given sepsis bolus as well as community-acquired pneumonia coverage.  Also evidence of bacteria in the urine along with bladder stones on imaging, ceftriaxone will cover for urinary tract infection as well and cultures sent.  I discussed the findings of vascular pathologies with her vascular surgeon Dr. Bridgett Larsson who states that these are chronic findings with his previous surgical history, patient also denies chest pain currently.  Admit for for UTI/pneumonia        FINAL CLINICAL IMPRESSION(S) / ED DIAGNOSES   Final diagnoses:  Generalized weakness  Urinary tract infection with hematuria, site unspecified  Community acquired pneumonia, unspecified laterality  Sepsis, due to unspecified organism, unspecified whether acute organ dysfunction present [facility])     Rx / DC Orders   ED Discharge Orders     None        Note:  This document was prepared using Dragon voice recognition software and may include unintentional dictation errors.    Lucillie Garfinkel, MD 03/21/22 [phone]

## 2022-03-20 NOTE — ED Triage Notes (Signed)
Pt to ER with c/o n/v cough, congestion for last week.  Pt reports increasing weakness and not being able to eat or drink much.

## 2022-03-20 NOTE — Assessment & Plan Note (Addendum)
Multiple bladder calculi with hematuria Neurogenic bladder/Self catheterizes Rocephin Follow cultures Can consider urology consult [facility] for DVT prophylaxis Patient prefers nurse to catheterize--> qid while awake

## 2022-03-20 NOTE — Assessment & Plan Note (Signed)
Supportive care. 

## 2022-03-20 NOTE — H&P (Signed)
History and Physical    Patient: Mitchell Dixon HTD:[id number] DOB: November 15, 1955 DOA: 03/20/2022 DOS: the patient was seen and examined on 03/20/2022 PCP: Emogene Morgan, MD  Patient coming from: Home  Chief Complaint:  Chief Complaint  Patient presents with   Nausea   Emesis   Cough   Weakness    HPI: Mitchell Dixon is a 67 y.o. 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  Denies fever or chills or chest pain.  Has had no vomiting or diarrhea.  Patient has neurogenic bladder and self catheterizes every 4 hours. ED Course and data review: Afebrile, tachycardic to 104 and tachypneic to 25 with otherwise normal vitals.  WBC 26,000 with lactic acid 2.3.  Respiratory viral panel negative for COVID flu and RSV.  Urinalysis turbid and red with many bacteria, consistent with UTI.  Creatinine 1.5, potassium 3.0.  Bilirubin elevated at 2.3.  Lipase AST and ALT WNL.  Hemoglobin 12.4.  No recent baseline labs for comparison since 2020.  EKG, personally viewed and interpreted showing NSR at 92 with no concerning ST-T wave changes.  CT chest abdomen and pelvis showing possible pneumonia as well as increased aneurysmal dilatation compared to 2018 as well as pseudoaneurysm as detailed below: Lungs/Pleura: Background of emphysema. Faint interstitial coarsening in the right middle lobe and right upper lobe, likely chronic. Atypical infiltrate is less likely. There are bibasilar subpleural atelectasis/scarring. There is trace left pleural effusion. No focal consolidation or pneumothorax. The central airways are patent.  IMPRESSION: 1. Possible developing atypical infiltrate in the right lung. 2. 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 Reported type A dissection on the CT dated 02/25/2017. 3. Possible small pneumomediastinum. CT may provide better evaluation.  IMPRESSION: 1. Status post repair of the ascending aorta. A dissection flap originates in the aortic arch just distal to the takeoff of the left subclavian artery and extends to the abdominal aortic bifurcation. There has been interval re-expansion of the true lumen since the prior CT. There is increase in the diameter of the aortic arch measuring approximately 4.5 cm (previously 3.7 cm). No periaortic fluid collection. 2. 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 Vascular surgery consult is advised. 3. Emphysema.  Trace left pleural effusion. 4. No bowel obstruction. Normal appendix. 5. Multiple large stones within the urinary bladder. Diffuse thickened appearance of the bladder wall may be related to chronic bladder infection. Correlation with urinalysis recommended to exclude active cystitis  The ED provider spoke with vascular surgeon, Dr. Imogene Burn who opined that findings on aorta likely chronic especially as patient has no chest pain.  Patient started on treatment for UTI and pneumonia and hospitalist consulted for admission.   Review of Systems: As mentioned in the history of present illness. All other systems reviewed and are negative.  Past Medical History:  Diagnosis Date   Essential hypertension    Past Surgical History:  Procedure Laterality Date   DIALYSIS/PERMA CATHETER INSERTION N/A 02/25/2017   Procedure: DIALYSIS/PERMA CATHETER INSERTION;  Surgeon: Annice Needy, MD;  Location: [facility] INVASIVE CV LAB;  Service: Cardiovascular;  Laterality: N/A;   EMBOLECTOMY Right 02/23/2017   Procedure: EMBOLECTOMY;  Surgeon: Renford Dills, MD;  Location: [facility]  ORS;  Service: Vascular;  Laterality: Right;   FASCIECTOMY Right 02/23/2017   Procedure: FASCIECTOMY;   Surgeon: Katha Cabal, MD;  Location: [facility] ORS;  Service: Vascular;  Laterality: Right;   HERNIA REPAIR     Social History:  reports that he has been smoking cigarettes. He has a 7.50 pack-year smoking history. He has never used smokeless tobacco. He reports that he does not drink alcohol and does not use drugs.  No Known Allergies  Family History  Problem Relation Age of Onset   Dementia Mother    Hypertension Mother    Pneumonia Father    Hypertension Sister     Prior to Admission medications   Medication Sig Start Date End Date Taking? Authorizing Provider  budesonide (PULMICORT) 0.25 MG/2ML nebulizer solution Take 2 mLs (0.25 mg total) by nebulization every 6 (six) hours. 02/26/17   Tukov-Yual, Arlyss Gandy, NP  hydrochlorothiazide (HYDRODIURIL) 25 MG tablet Take 1 tablet (25 mg total) by mouth daily. 04/11/17   Doles-Johnson, Teah, NP  ipratropium-albuterol (DUONEB) 0.5-2.5 (3) MG/3ML SOLN Take 3 mLs by nebulization every 6 (six) hours. 02/26/17   Tukov-Yual, Arlyss Gandy, NP  potassium chloride SA (K-DUR,KLOR-CON) 20 MEQ tablet TAKE ONE TABLET BY MOUTH EVERY DAY 04/07/17   Doles-Johnson, Teah, NP    Physical Exam: Vitals:   03/20/22 2100 03/20/22 2130 03/20/22 2200 03/20/22 2230  BP: 116/79  117/79 133/86  Pulse: (!) 102 (!) 103 99 100  Resp: 20 (!) 24 (!) 22 (!) 25  Temp:      TempSrc:      SpO2: 95% 99% 96% 96%  Weight:      Height:       Physical Exam Vitals and nursing note reviewed.  Constitutional:      General: He is not in acute distress. HENT:     Head: Normocephalic and atraumatic.  Cardiovascular:     Rate and Rhythm: Regular rhythm. Tachycardia present.     Heart sounds: Normal heart sounds.  Pulmonary:     Effort: Tachypnea present.     Breath sounds: Normal breath sounds.  Abdominal:     Palpations: Abdomen is soft.     Tenderness: There is no abdominal tenderness.  Neurological:     Mental Status: Mental status is at baseline.     Comments:  Paraplegia     Labs on Admission: I have personally reviewed following labs and imaging studies  CBC: Recent Labs  Lab 03/20/22 1859  WBC 26.7*  NEUTROABS 24.8*  HGB 12.4*  HCT 40.0  MCV 94.6  PLT 267   Basic Metabolic Panel: Recent Labs  Lab 03/20/22 1859  NA 133*  K 3.0*  CL 100  CO2 20*  GLUCOSE 98  BUN 27*  CREATININE 1.50*  CALCIUM 8.4*   GFR: Estimated Creatinine Clearance: 56.4 mL/min (A) (by C-G formula based on SCr of 1.5 mg/dL (H)). Liver Function Tests: Recent Labs  Lab 03/20/22 1859  AST 33  ALT 12  ALKPHOS 129*  BILITOT 2.3*  PROT 7.6  ALBUMIN 3.0*   Recent Labs  Lab 03/20/22 1859  LIPASE 31   No results for input(s): "AMMONIA" in the last 168 hours. Coagulation Profile: No results for input(s): "INR", "PROTIME" in the last 168 hours. Cardiac Enzymes: No results for input(s): "CKTOTAL", "CKMB", "CKMBINDEX", "TROPONINI" in the last 168 hours. BNP (last 3 results) No results for input(s): "PROBNP" in the last 8760 hours. HbA1C: No results for input(s): "HGBA1C" in the last 72 hours. CBG:  No results for input(s): "GLUCAP" in the last 168 hours. Lipid Profile: No results for input(s): "CHOL", "HDL", "LDLCALC", "TRIG", "CHOLHDL", "LDLDIRECT" in the last 72 hours. Thyroid Function Tests: No results for input(s): "TSH", "T4TOTAL", "FREET4", "T3FREE", "THYROIDAB" in the last 72 hours. Anemia Panel: No results for input(s): "VITAMINB12", "FOLATE", "FERRITIN", "TIBC", "IRON", "RETICCTPCT" in the last 72 hours. Urine analysis:    Component Value Date/Time   COLORURINE RED (A) 03/20/2022 1900   APPEARANCEUR TURBID (A) 03/20/2022 1900   LABSPEC 1.023 03/20/2022 1900   PHURINE  03/20/2022 1900    TEST NOT REPORTED DUE TO COLOR INTERFERENCE OF URINE PIGMENT   GLUCOSEU (A) 03/20/2022 1900    TEST NOT REPORTED DUE TO COLOR INTERFERENCE OF URINE PIGMENT   HGBUR (A) 03/20/2022 1900    TEST NOT REPORTED DUE TO COLOR INTERFERENCE OF URINE PIGMENT    BILIRUBINUR (A) 03/20/2022 1900    TEST NOT REPORTED DUE TO COLOR INTERFERENCE OF URINE PIGMENT   KETONESUR (A) 03/20/2022 1900    TEST NOT REPORTED DUE TO COLOR INTERFERENCE OF URINE PIGMENT   PROTEINUR (A) 03/20/2022 1900    TEST NOT REPORTED DUE TO COLOR INTERFERENCE OF URINE PIGMENT   NITRITE (A) 03/20/2022 1900    TEST NOT REPORTED DUE TO COLOR INTERFERENCE OF URINE PIGMENT   LEUKOCYTESUR (A) 03/20/2022 1900    TEST NOT REPORTED DUE TO COLOR INTERFERENCE OF URINE PIGMENT    Radiological Exams on Admission: CT CHEST ABDOMEN PELVIS W CONTRAST  Result Date: 03/20/2022 CLINICAL DATA:  Sepsis and cough. Reported history of type A aortic dissection on CT dated 02/25/2017. EXAM: CT CHEST, ABDOMEN, AND PELVIS WITH CONTRAST TECHNIQUE: Multidetector CT imaging of the chest, abdomen and pelvis was performed following the standard protocol during bolus administration of intravenous contrast. RADIATION DOSE REDUCTION: This exam was performed according to the departmental dose-optimization program which includes automated exposure control, adjustment of the mA and/or kV according to patient size and/or use of iterative reconstruction technique. CONTRAST:  142mL OMNIPAQUE IOHEXOL 300 MG/ML  SOLN COMPARISON:  CT dated 02/25/2017. FINDINGS: CT CHEST FINDINGS Cardiovascular: There is no cardiomegaly or pericardial effusion. Implantable pacemaker device. Status post repair of ascending aorta. A dissection flap originates in the aortic arch just distal to the takeoff of the left subclavian artery. 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 Tiny focus of density adjacent to the pseudoaneurysm may represent postsurgical changes. A small amount of fluid is not excluded. The origins of the great vessels of the aortic arch remain patent. The dissection flap extends into the abdominal aorta. There has been interval re-expansion  of the true lumen since the prior CT. There is increase in the diameter of the aortic arch measuring approximately 4.5 cm (previously 3.7 cm. No periaortic fluid collection. The central pulmonary arteries appear patent. Mediastinum/Nodes: No hilar or mediastinal adenopathy. The esophagus is grossly unremarkable. No mediastinal fluid collection. Lungs/Pleura: Background of emphysema. Faint interstitial coarsening in the right middle lobe and right upper lobe, likely chronic. Atypical infiltrate is less likely. There are bibasilar subpleural atelectasis/scarring. There is trace left pleural effusion. No focal consolidation or pneumothorax. The central airways are patent. Musculoskeletal: Median sternotomy wires. No acute osseous pathology. CT ABDOMEN PELVIS FINDINGS No intra-abdominal free air or free fluid. Hepatobiliary: The liver is unremarkable. No biliary dilatation. The gallbladder is unremarkable. Pancreas: Unremarkable. No pancreatic ductal dilatation or surrounding inflammatory changes. Spleen: Normal in size without focal  abnormality. Adrenals/Urinary Tract: The adrenal glands unremarkable. Atrophic right kidney. A cluster of stones in the upper pole of the left kidney measure up to 13 mm. No hydronephrosis. Areas of cortical scarring involving the upper and middle pole of the left kidney. 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 The urinary bladder is collapsed. Multiple large stones noted within the urinary bladder. There is diffuse thickened appearance of the bladder wall which may be related to chronic bladder infection. Correlation with urinalysis recommended to exclude active cystitis. Stomach/Bowel: There is no bowel obstruction or active inflammation. The appendix is normal. Vascular/Lymphatic: Aortic dissection with flap terminating at the aortic bifurcation. The celiac axis arises from the false lumen and appears  patent. The SMA appears patent. The dissection flap extends into the origin of the SMA. The left renal artery is patent and arises from the false lumen. The right renal artery appears patent however slightly hypoperfused. The IMA is patent. Common iliac artery stents noted. The stents appear patent. The external iliac arteries are patent. The IVC is unremarkable. No portal venous gas. There is no adenopathy. Reproductive: The prostate and seminal vesicles are grossly unremarkable. Other: None Musculoskeletal: Osteopenia with degenerative changes of the spine. Chronic changes of the left hip. No acute osseous pathology. IMPRESSION: 1. Status post repair of the ascending aorta. A dissection flap originates in the aortic arch just distal to the takeoff of the left subclavian artery and extends to the abdominal aortic bifurcation. There has been interval re-expansion of the true lumen since the prior CT. There is increase in the diameter of the aortic arch measuring approximately 4.5 cm (previously 3.7 cm). No periaortic fluid collection. 2. 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 Vascular surgery consult is advised. 3. Emphysema.  Trace left pleural effusion. 4. No bowel obstruction. Normal appendix. 5. Multiple large stones within the urinary bladder. Diffuse thickened appearance of the bladder wall may be related to chronic bladder infection. Correlation with urinalysis recommended to exclude active cystitis. Electronically Signed   By: Elgie Collard M.D.   On: 03/20/2022 21:43   DG Chest Port 1 View  Result Date: 03/20/2022 CLINICAL DATA:  Cough. EXAM: PORTABLE CHEST 1 VIEW COMPARISON:  Chest radiograph dated 02/25/2017 FINDINGS: There is mild diffuse interstitial and peribronchial densities primarily involving the right lung with areas of nodularity and new compared to prior radiograph. Findings may represent chronic bronchitic changes but developing atypical  infiltrate is not excluded. Clinical correlation is recommended. No focal consolidation, pleural effusion, or pneumothorax. The cardiac silhouette is within normal limits. There is aneurysmal dilatation of the aortic arch measuring up to 6.1 cm in diameter. There is report of type A dissection on CT dated 02/25/2017. The images of the head CT are not available for comparison. A thin linear lucency to the left of the aortic arch and proximal descending aorta may be artifactual and air within a bronchus. A small pneumomediastinum is not excluded. CT may provide better evaluation. Median sternotomy wires and an implantable device. No acute osseous pathology. IMPRESSION: 1. Possible developing atypical infiltrate in the right lung. 2. 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 Reported type A dissection on the CT dated 02/25/2017. 3. Possible small pneumomediastinum. CT may provide better evaluation. Electronically Signed   By: Elgie Collard M.D.   On: 03/20/2022 19:17     Data Reviewed: Relevant notes  from primary care and specialist visits, past discharge summaries as available in EHR, including Care Everywhere. Prior diagnostic testing as pertinent to current admission diagnoses Updated medications and problem lists for reconciliation ED course, including vitals, labs, imaging, treatment and response to treatment Triage notes, nursing and pharmacy notes and ED provider's notes Notable results as noted in HPI   Assessment and Plan: * Sepsis ([place]) Sepsis criteria includes tachycardia, tachypnea, leukocytosis with lactic acidosis, pneumonia and UTI Sepsis fluids, treat source Follow cultures  Urinary tract infection with hematuria Multiple bladder calculi with hematuria Rocephin Follow cultures Can consider urology consult SCD for DVT prophylaxis  CAP (community acquired pneumonia) Rocephin and azithromycin Antitussives, albuterol are all as needed, incentive  spirometer Supplemental oxygen if needed  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 Bridgett Larsson was consulted from ED and said stable findings Can consider inpatient vascular consult to follow versus outpatient referral relents  AKI (acute kidney injury) ([place]) Creatinine 1.5, presumed AKI Monitor response to fluid resuscitation for management of sepsis  Hypokalemia Potassium of 3 Oral repletion and monitor  Chronic complete paraplegia [facility]) Supportive care  PAD (peripheral artery disease) ([place]) Does not appear to be on any medication at this time  History of cardiac pacemaker No acute issues suspected however last documented check on Care Everywhere appears to have been over a year ago 10/2020 at [place] for pacemaker check  Hypertension Normotensive.  Will hold off on antihypertensives in the setting of sepsis        DVT prophylaxis: SCD due to hematuria  Consults: none  Advance Care Planning:   Code Status: Prior   Family Communication: none  Disposition Plan: Back to previous home environment  Severity of Illness: The appropriate patient status for this patient is INPATIENT. Inpatient status is judged to be reasonable and necessary in order to provide the required intensity of service to ensure the patient's safety. The patient's presenting symptoms, physical exam findings, and initial radiographic and laboratory data in the context of their chronic comorbidities is felt to place them at high risk for further clinical deterioration. Furthermore, it is not anticipated that the patient will be medically stable for discharge from the hospital within 2 midnights of admission.   * I certify that at the point of admission it  is my clinical judgment that the patient will require inpatient hospital care spanning beyond 2 midnights from the point of admission due to high intensity of service, high risk for further deterioration and high frequency of surveillance required.*  Author: Athena Masse, MD 03/20/2022 10:50 PM  For on call review [email or web address].

## 2022-03-20 NOTE — Assessment & Plan Note (Signed)
Sepsis criteria includes tachycardia, tachypnea, leukocytosis with lactic acidosis, pneumonia and UTI Sepsis fluids, treat source Follow cultures

## 2022-03-21 DIAGNOSIS — N261 Atrophy of kidney (terminal): Secondary | ICD-10-CM

## 2022-03-21 DIAGNOSIS — A419 Sepsis, unspecified organism: Secondary | ICD-10-CM | POA: Diagnosis not present

## 2022-03-21 DIAGNOSIS — N319 Neuromuscular dysfunction of bladder, unspecified: Secondary | ICD-10-CM | POA: Diagnosis not present

## 2022-03-21 DIAGNOSIS — N210 Calculus in bladder: Secondary | ICD-10-CM

## 2022-03-21 DIAGNOSIS — N200 Calculus of kidney: Secondary | ICD-10-CM | POA: Diagnosis not present

## 2022-03-21 DIAGNOSIS — R6520 Severe sepsis without septic shock: Secondary | ICD-10-CM

## 2022-03-21 LAB — BLOOD CULTURE ID PANEL (REFLEXED) - BCID2

## 2022-03-21 LAB — BASIC METABOLIC PANEL
Anion gap: 9 (ref 5–15)
BUN: 24 mg/dL — ABNORMAL HIGH (ref 8–23)
CO2: 20 mmol/L — ABNORMAL LOW (ref 22–32)
Calcium: 7.6 mg/dL — ABNORMAL LOW (ref 8.9–10.3)
Chloride: 106 mmol/L (ref 98–111)
Creatinine, Ser: 1.2 mg/dL (ref 0.61–1.24)
GFR, Estimated: >60 mL/min (ref >60)
Glucose, Bld: 82 mg/dL (ref 70–99)
Potassium: 2.9 mmol/L — ABNORMAL LOW (ref 3.5–5.1)
Sodium: 135 mmol/L (ref 135–145)

## 2022-03-21 LAB — CBC
HCT: 31.7 % — ABNORMAL LOW (ref 39.0–52.0)
Hemoglobin: 10.1 g/dL — ABNORMAL LOW (ref 13.0–17.0)
MCH: 29.3 pg (ref 26.0–34.0)
MCHC: 31.9 g/dL (ref 30.0–36.0)
MCV: 91.9 fL (ref 80.0–100.0)
Platelets: 157 10*3/uL (ref 150–400)
RBC: 3.45 MIL/uL — ABNORMAL LOW (ref 4.22–5.81)
RDW: 14.6 % (ref 11.5–15.5)
WBC: 20.5 10*3/uL — ABNORMAL HIGH (ref 4.0–10.5)
nRBC: 0 % (ref 0.0–0.2)

## 2022-03-21 LAB — CORTISOL-AM, BLOOD: Cortisol - AM: 22.1 ug/dL (ref 6.7–22.6)

## 2022-03-21 LAB — PROCALCITONIN: Procalcitonin: 22.7 ng/mL

## 2022-03-21 LAB — HIV ANTIBODY (ROUTINE TESTING W REFLEX): HIV Screen 4th Generation wRfx: NONREACTIVE

## 2022-03-21 LAB — PROTIME-INR
INR: 1.7 — ABNORMAL HIGH (ref 0.8–1.2)
Prothrombin Time: 19.7 seconds — ABNORMAL HIGH (ref 11.4–15.2)

## 2022-03-21 MED ORDER — POTASSIUM CHLORIDE 20 MEQ PO PACK
40.0000 meq | PACK | Freq: Once | ORAL | Status: AC
  Administered 2022-03-21: 40 meq via ORAL
  Filled 2022-03-21: qty 2

## 2022-03-21 NOTE — ED Notes (Addendum)
This RN to bedside to attempt q4 catheterization. Coude tip used at this time which is connected to foley bag. Urology MD Stoioff to bedside at this time. Little to no output from catheterization, bladder scanned showed 71mLs in bladder. Per urology, keep foley in at this time to check for output. Pt checked for BM, pt clean and dry at this time. Foley remains in place, [facility] Family to bedside at this time.

## 2022-03-21 NOTE — ED Notes (Signed)
First encounter with the pt, the pt has been to room 34 and this RN has assumed care for the pt at this time. The pt's brief, gown and linen have been changed and the pt has been placed on the cardiac monitor at this time.

## 2022-03-21 NOTE — ED Notes (Signed)
Pt straight cathed, scant amount of red fluid in bag. Pt with small BM, pt cleaned at this time, new brief placed on pt and new chucks pad placed underneath. Mepilex replaced on buttocks. pt repositioned in bed for comfort, placed in high fowlers to eat breakfast. X2 pillows placed under heels to alleviate pressure. Call light in reach, [place].

## 2022-03-21 NOTE — ED Notes (Addendum)
The pt is sleeping at this time, per MD order the pt is to be in/out cath Q4hr while awake. Close monitoring continued.

## 2022-03-21 NOTE — Progress Notes (Addendum)
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  Patient has neurogenic bladder and self catheterizes every 4 hours.  ED workup shows tachypneic, tachycardic, leukocytosis with a lactic acid of 2.3.  RSV, Influenza, flu negative.  UA shows many bacteria consistent with UTI.  CT abdomen and pelvis showed possible pneumonia as well as increased aneurysmal dilatation compared to 2018 as well as pseudoaneurysm.  ED physician has spoken with vascular surgeon Dr. Imogene Burn who states findings on aorta are likely chronic especially as patient has no chest pain. He recommended treatment for UTI and pneumonia.  Patient is admitted for further management.  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 Continue empiric antibiotics (ceftriaxone and Zithromax).   Continue IV fluid resuscitation. Lactic acid improving with IV fluid  resuscitation. Follow-up cultures.   UTI with hematuria: Multiple bladder calculi with hematuria: Continue empiric Rocephin. Continue IV fluid resuscitation. Urology is consulted, suggested cystolitholapaxy once acute episode is resolved.  Due to multiplicity of stones this will most likely require a staged procedure. SCD for DVT prophylaxis   Community-acquired pneumonia: Continue empiric antibiotic Rocephin and Zithromax. Continue Antitussives, albuterol as needed, incentive spirometer Continue supplemental oxygen as needed.   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 Imogene Burn was consulted from ED and said stable findings. Can consider inpatient vascular consult to follow versus outpatient referral.   Acute kidney injury: > Resolved. Creatinine 1.5, presumed AKI Renal functions improved and back to baseline with fluid resuscitation.   Hypokalemia: Replaced. Continue to monitor  Chronic complete paraplegia: Continue Supportive care   Peripheral arterial disease: Does not appear to be on any medication at this time.   History of cardiac pacemaker: No acute issues suspected however last interrogated was on 10/2020 at [facility] EP Pacemaker interrogated.   Essential hypertension: Normotensive.  Hold off blood pressure medication in the setting of sepsis.   DVT prophylaxis: SCDs Code Status: Full code Family Communication: No family at bed side. Disposition Plan:  Status is: Inpatient Remains inpatient appropriate because: Admitted for sepsis sec.to UTI, PNA, requiring IV antibiotics. Urology is consulted.   Consultants:  Urology  Procedures:None  Antimicrobials: Ceftriaxone and Zithromax  Subjective: Patient was seen and examined at bedside.   Overnight events noted.  Patient reports doing better.  He still has hematuria when he catheterizes himself.  Objective: Vitals:   03/21/22 0800 03/21/22 0822 03/21/22 1200 03/21/22 1256  BP: 126/89  (!) 127/90   Pulse: (!) 109  (!) 105   Resp: (!) 21  18   Temp:  98 F (36.7 C)  (!) 97.5 F (36.4 C)  TempSrc:  Oral  Oral  SpO2: 99%  97%   Weight:      Height:        Intake/Output Summary (Last 24 hours) at 03/21/2022 1511 Last data filed at 03/21/2022 1610 Gross per 24 hour  Intake 2317.5 ml  Output --  Net 2317.5 ml   Filed Weights   03/20/22 1808  Weight: 90.7 kg    Examination:  General exam: Appears comfortable, not in distress.  Deconditioned. Respiratory system: CTA bilaterally, respiratory effort normal, RR 13. Cardiovascular system: S1 & S2 heard, regular rate and rhythm, no murmur. Gastrointestinal system: Abdomen is soft, non tender, nondistended, BS+ Central nervous system: Alert and oriented x 3. No focal neurological deficits. Extremities: No edema, no cyanosis, no clubbing. Skin: No rashes, lesions or ulcers Psychiatry: Judgement and insight appear normal. Mood & affect appropriate.     Data Reviewed: I have personally reviewed following labs and imaging studies  CBC: Recent Labs  Lab 03/20/22 1859 03/21/22 0433  WBC 26.7* 20.5*  NEUTROABS 24.8*  --   HGB 12.4* 10.1*  HCT 40.0 31.7*  MCV 94.6 91.9  PLT 186 157   Basic Metabolic Panel: Recent Labs  Lab 03/20/22 1859 03/21/22 0433  NA 133* 135  K 3.0* 2.9*  CL 100 106  CO2 20* 20*  GLUCOSE 98 82  BUN 27* 24*  CREATININE 1.50* 1.20  CALCIUM 8.4* 7.6*   GFR: Estimated Creatinine Clearance: 70.5 mL/min (by C-G formula based on SCr of 1.2 mg/dL). Liver Function Tests: Recent Labs  Lab 03/20/22 1859  AST 33  ALT 12  ALKPHOS 129*  BILITOT 2.3*  PROT 7.6  ALBUMIN 3.0*   Recent Labs  Lab 03/20/22 1859  LIPASE 31   No results for input(s): "AMMONIA" in the last 168  hours. Coagulation Profile: Recent Labs  Lab 03/21/22 0433  INR 1.7*   Cardiac Enzymes: No results for input(s): "CKTOTAL", "CKMB", "CKMBINDEX", "TROPONINI" in the last 168 hours. BNP (last 3 results) No results for input(s): "PROBNP" in the last 8760 hours. HbA1C: No results for input(s): "HGBA1C" in the last 72 hours. CBG: No results for input(s): "GLUCAP" in the last 168 hours. Lipid Profile: No results for input(s): "CHOL", "HDL", "LDLCALC", "TRIG", "CHOLHDL", "LDLDIRECT" in the last 72 hours. Thyroid Function Tests: No results for input(s): "TSH", "T4TOTAL", "FREET4", "T3FREE", "THYROIDAB" in the last 72 hours. Anemia Panel: No results for input(s): "VITAMINB12", "FOLATE", "FERRITIN", "TIBC", "IRON", "RETICCTPCT" in the last 72 hours. Sepsis Labs: Recent Labs  Lab 03/20/22 1859 03/20/22 2059 03/21/22 0433  PROCALCITON  --   --  22.70  LATICACIDVEN 3.7* 2.3*  --     Recent Results (from the past 240 hour(s))  Resp panel by RT-PCR (RSV, Flu A&B, Covid) Anterior Nasal Swab     Status: None   Collection Time: 03/20/22  7:00 PM   Specimen: Anterior Nasal Swab  Result Value Ref Range Status   SARS Coronavirus 2 by RT PCR NEGATIVE NEGATIVE Final    Comment: (NOTE) SARS-CoV-2 target nucleic acids are NOT DETECTED.  The SARS-CoV-2 RNA is generally detectable in upper respiratory specimens during the acute phase of infection. The lowest  concentration of SARS-CoV-2 viral copies this assay can detect is 138 copies/mL. A negative result does not preclude SARS-Cov-2 infection and should not be used as the sole basis for treatment or other patient management decisions. A negative result may occur with  improper specimen collection/handling, submission of specimen other than nasopharyngeal swab, presence of viral mutation(s) within the areas targeted by this assay, and inadequate number of viral copies(<138 copies/mL). A negative result must be combined with clinical  observations, patient history, and epidemiological information. The expected result is Negative.  Fact Sheet for Patients:  EntrepreneurPulse.com.au  Fact Sheet for Healthcare Providers:  IncredibleEmployment.be  This test is no t yet approved or cleared by the [place] FDA and  has been authorized for detection and/or diagnosis of SARS-CoV-2 by FDA under an Emergency Use Authorization (EUA). This EUA will remain  in effect (meaning this test can be used) for the duration of the COVID-19 declaration under Section 564(b)(1) of the Act, 21 U.S.C.section 360bbb-3(b)(1), unless the authorization is terminated  or revoked sooner.       Influenza A by PCR NEGATIVE NEGATIVE Final   Influenza B by PCR NEGATIVE NEGATIVE Final    Comment: (NOTE) The Xpert Xpress SARS-CoV-2/FLU/RSV plus assay is intended as an aid in the diagnosis of influenza from Nasopharyngeal swab specimens and should not be used as a sole basis for treatment. Nasal washings and aspirates are unacceptable for Xpert Xpress SARS-CoV-2/FLU/RSV testing.  Fact Sheet for Patients: EntrepreneurPulse.com.au  Fact Sheet for Healthcare Providers: IncredibleEmployment.be  This test is not yet approved or cleared by the [place] FDA and has been authorized for detection and/or diagnosis of SARS-CoV-2 by FDA under an Emergency Use Authorization (EUA). This EUA will remain in effect (meaning this test can be used) for the duration of the COVID-19 declaration under Section 564(b)(1) of the Act, 21 U.S.C. section 360bbb-3(b)(1), unless the authorization is terminated or revoked.     Resp Syncytial Virus by PCR NEGATIVE NEGATIVE Final    Comment: (NOTE) Fact Sheet for Patients: EntrepreneurPulse.com.au  Fact Sheet for Healthcare Providers: IncredibleEmployment.be  This test is not yet approved or cleared by  the [place] FDA and has been authorized for detection and/or diagnosis of SARS-CoV-2 by FDA under an Emergency Use Authorization (EUA). This EUA will remain in effect (meaning this test can be used) for the duration of the COVID-19 declaration under Section 564(b)(1) of the Act, 21 U.S.C. section 360bbb-3(b)(1), unless the authorization is terminated or revoked.  Performed at [facility], [address] [place], [place] [address]   Blood Culture (routine x 2)     Status: None (Preliminary result)   Collection Time: 03/20/22  8:44 PM   Specimen: BLOOD  Result Value Ref Range Status   Specimen Description BLOOD BLOOD RIGHT FOREARM  Final   Special Requests   Final    BOTTLES DRAWN AEROBIC AND ANAEROBIC Blood Culture adequate volume   Culture   Final    NO GROWTH < 12 HOURS Performed at [facility], [address], [place], [place] [address]    Report Status PENDING  Incomplete         Radiology Studies: CT CHEST ABDOMEN PELVIS W CONTRAST  Result Date: 03/20/2022 CLINICAL DATA:  Sepsis and cough. Reported history of type A aortic dissection on CT dated 02/25/2017. EXAM: CT CHEST, ABDOMEN, AND PELVIS WITH CONTRAST TECHNIQUE: Multidetector CT imaging of the chest, abdomen and pelvis was performed following the standard protocol during bolus administration of  intravenous contrast. RADIATION DOSE REDUCTION: This exam was performed according to the departmental dose-optimization program which includes automated exposure control, adjustment of the mA and/or kV according to patient size and/or use of iterative reconstruction technique. CONTRAST:  126mL OMNIPAQUE IOHEXOL 300 MG/ML  SOLN COMPARISON:  CT dated 02/25/2017. FINDINGS: CT CHEST FINDINGS Cardiovascular: There is no cardiomegaly or pericardial effusion. Implantable pacemaker device. Status post repair of ascending aorta. A dissection flap originates in the aortic arch just distal to the takeoff of the left  subclavian artery. 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 Tiny focus of density adjacent to the pseudoaneurysm may represent postsurgical changes. A small amount of fluid is not excluded. The origins of the great vessels of the aortic arch remain patent. The dissection flap extends into the abdominal aorta. There has been interval re-expansion of the true lumen since the prior CT. There is increase in the diameter of the aortic arch measuring approximately 4.5 cm (previously 3.7 cm. No periaortic fluid collection. The central pulmonary arteries appear patent. Mediastinum/Nodes: No hilar or mediastinal adenopathy. The esophagus is grossly unremarkable. No mediastinal fluid collection. Lungs/Pleura: Background of emphysema. Faint interstitial coarsening in the right middle lobe and right upper lobe, likely chronic. Atypical infiltrate is less likely. There are bibasilar subpleural atelectasis/scarring. There is trace left pleural effusion. No focal consolidation or pneumothorax. The central airways are patent. Musculoskeletal: Median sternotomy wires. No acute osseous pathology. CT ABDOMEN PELVIS FINDINGS No intra-abdominal free air or free fluid. Hepatobiliary: The liver is unremarkable. No biliary dilatation. The gallbladder is unremarkable. Pancreas: Unremarkable. No pancreatic ductal dilatation or surrounding inflammatory changes. Spleen: Normal in size without focal abnormality. Adrenals/Urinary Tract: The adrenal glands unremarkable. Atrophic right kidney. A cluster of stones in the upper pole of the left kidney measure up to 13 mm. No hydronephrosis. Areas of cortical scarring involving the upper and middle pole of the left kidney. 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 The urinary bladder is collapsed. Multiple large stones  noted within the urinary bladder. There is diffuse thickened appearance of the bladder wall which may be related to chronic bladder infection. Correlation with urinalysis recommended to exclude active cystitis. Stomach/Bowel: There is no bowel obstruction or active inflammation. The appendix is normal. Vascular/Lymphatic: Aortic dissection with flap terminating at the aortic bifurcation. The celiac axis arises from the false lumen and appears patent. The SMA appears patent. The dissection flap extends into the origin of the SMA. The left renal artery is patent and arises from the false lumen. The right renal artery appears patent however slightly hypoperfused. The IMA is patent. Common iliac artery stents noted. The stents appear patent. The external iliac arteries are patent. The IVC is unremarkable. No portal venous gas. There is no adenopathy. Reproductive: The prostate and seminal vesicles are grossly unremarkable. Other: None Musculoskeletal: Osteopenia with degenerative changes of the spine. Chronic changes of the left hip. No acute osseous pathology. IMPRESSION: 1. Status post repair of the ascending aorta. A dissection flap originates in the aortic arch just distal to the takeoff of the left subclavian artery and extends to the abdominal aortic bifurcation. There has been interval re-expansion of the true lumen since the prior CT. There is increase in the diameter of the aortic arch measuring approximately 4.5 cm (previously 3.7 cm). No periaortic fluid collection. 2. 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 Vascular surgery consult is advised. 3. Emphysema.  Trace left pleural effusion. 4. No bowel obstruction. Normal appendix. 5. Multiple large stones within the urinary bladder. Diffuse thickened appearance of the bladder wall may be related to chronic bladder infection. Correlation with urinalysis recommended to exclude active cystitis. Electronically Signed    By: Elgie Collard M.D.   On: 03/20/2022 21:43   DG Chest Port 1 View  Result Date: 03/20/2022 CLINICAL DATA:  Cough. EXAM: PORTABLE CHEST 1 VIEW COMPARISON:  Chest radiograph dated 02/25/2017 FINDINGS: There is mild diffuse interstitial and peribronchial densities primarily involving the right lung with areas of nodularity and new compared to prior radiograph. Findings may represent chronic bronchitic changes but developing atypical infiltrate is not excluded. Clinical correlation is recommended. No focal consolidation, pleural effusion, or pneumothorax. The cardiac silhouette is within normal limits. There is aneurysmal dilatation of the aortic arch measuring up to 6.1 cm in diameter. There is report of type A dissection on CT dated 02/25/2017. The images of the head CT are not available for comparison. A thin linear lucency to the left of the aortic arch and proximal descending aorta may be artifactual and air within a bronchus. A small pneumomediastinum is not excluded. CT may provide better evaluation. Median sternotomy wires and an implantable device. No acute osseous pathology. IMPRESSION: 1. Possible developing atypical infiltrate in the right lung. 2. 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 Reported type A dissection on the CT dated 02/25/2017. 3. Possible small pneumomediastinum. CT may provide better evaluation. Electronically Signed   By: Elgie Collard M.D.   On: 03/20/2022 19:17    Scheduled Meds:  guaiFENesin  600 mg Oral BID   Continuous Infusions:  azithromycin     cefTRIAXone (ROCEPHIN)  IV       LOS: 1 day    Time spent: 50 mins   [name] Bunker, MD [facility]   If 7PM-7AM, please contact night-coverage

## 2022-03-21 NOTE — Consult Note (Signed)
Urology Consult  Requesting physician: [name] Bunker, MD  Reason for consultation: Bladder calculi  History of Present Illness: Mitchell Dixon is a 67 y.o. 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  WBC 26,000 with lactic acid 2.3.  Respiratory panel negative for COVID, influenza and RSV.  Neurogenic bladder currently performing CIC every 4 hours Urinalysis on admission red/turbid with >50 RBC/>50 WBC and many bacteria Saw Dr. Raoul Pitch June 2019 and urodynamics recommended apparently was never performed CT chest/abdomen/pelvis performed on admission remarkable for an atrophic right kidney and nonobstructing left renal calculi in the left upper pole.  Multiple bladder calculi noted several measuring 1.5-2+ cm He has had no problems with intermittent catheterization   Past Medical History:  Diagnosis Date   Essential hypertension     Past Surgical History:  Procedure Laterality Date   DIALYSIS/PERMA CATHETER INSERTION N/A 02/25/2017   Procedure: DIALYSIS/PERMA CATHETER INSERTION;  Surgeon: Annice Needy, MD;  Location: [facility];  Service: Cardiovascular;  Laterality: N/A;   EMBOLECTOMY Right 02/23/2017   Procedure: EMBOLECTOMY;  Surgeon: Renford Dills, MD;  Location: [facility] ORS;  Service: Vascular;  Laterality: Right;   FASCIECTOMY Right 02/23/2017   Procedure: FASCIECTOMY;  Surgeon: Renford Dills, MD;  Location: [facility] ORS;  Service: Vascular;  Laterality: Right;   HERNIA REPAIR      Home Medications:  Current Meds  Medication Sig   atorvastatin (LIPITOR) 40 MG tablet Take 40 mg by mouth every evening.   ELIQUIS 5 MG TABS tablet Take 5 mg by mouth 2 (two) times daily.   metoprolol tartrate (LOPRESSOR) 50 MG tablet Take 50 mg by mouth 2 (two) times daily.    Allergies: No  Known Allergies  Family History  Problem Relation Age of Onset   Dementia Mother    Hypertension Mother    Pneumonia Father    Hypertension Sister     Social History:  reports that he has been smoking cigarettes. He has a 7.50 pack-year smoking history. He has never used smokeless tobacco. He reports that he does not drink alcohol and does not use drugs.  ROS: A complete review of systems was performed.  All systems are negative except for pertinent findings as noted.  Physical Exam:  Vital signs in last 24 hours: Temp:  [97.5 F (36.4 C)-98.1 F (36.7 C)] 97.5 F (36.4 C) (01/21 1256) Pulse Rate:  [92-109] 105 (01/21 1200) Resp:  [17-25] 18 (01/21 1200) BP: (104-133)/(79-93) 127/90 (01/21 1200) SpO2:  [93 %-99 %] 97 % (01/21 1200) Weight:  [90.7 kg] 90.7 kg (01/20 1808) Constitutional:  Alert , No acute distress HEENT: [place] AT, moist mucus membranes.   Respiratory: Normal respiratory effort, GI: Abdomen is soft, nontender, nondistended, no abdominal masses GU: Phallus mild skin excoriation right proximal shaft Skin: No rashes, bruises or suspicious lesions Neurologic: Paraplegia Psychiatric: Normal mood and affect   Laboratory Data:  Recent Labs    03/20/22 1859 03/21/22 0433  WBC 26.7* 20.5*  HGB 12.4* 10.1*  HCT 40.0 31.7*   Recent Labs    03/20/22 1859 03/21/22 0433  NA 133* 135  K 3.0* 2.9*  CL 100 106  CO2 20* 20*  GLUCOSE 98 82  BUN 27* 24*  CREATININE 1.50* 1.20  CALCIUM 8.4* 7.6*   Recent Labs    03/21/22 0433  INR 1.7*   No results for input(s): "LABURIN" in the last 72 hours. Results for orders placed or performed during the hospital encounter of 03/20/22  Resp panel by RT-PCR (RSV, Flu A&B, Covid) Anterior Nasal Swab     Status: None   Collection Time: 03/20/22  7:00 PM   Specimen: Anterior Nasal Swab  Result Value Ref Range Status   SARS Coronavirus 2 by RT PCR NEGATIVE NEGATIVE Final    Comment: (NOTE) SARS-CoV-2 target nucleic acids  are NOT DETECTED.  The SARS-CoV-2 RNA is generally detectable in upper respiratory specimens during the acute phase of infection. The lowest concentration of SARS-CoV-2 viral copies this assay can detect is 138 copies/mL. A negative result does not preclude SARS-Cov-2 infection and should not be used as the sole basis for treatment or other patient management decisions. A negative result may occur with  improper specimen collection/handling, submission of specimen other than nasopharyngeal swab, presence of viral mutation(s) within the areas targeted by this assay, and inadequate number of viral copies(<138 copies/mL). A negative result must be combined with clinical observations, patient history, and epidemiological information. The expected result is Negative.  Fact Sheet for Patients:  EntrepreneurPulse.com.au  Fact Sheet for Healthcare Providers:  IncredibleEmployment.be  This test is no t yet approved or cleared by the [place] FDA and  has been authorized for detection and/or diagnosis of SARS-CoV-2 by FDA under an Emergency Use Authorization (EUA). This EUA will remain  in effect (meaning this test can be used) for the duration of the COVID-19 declaration under Section 564(b)(1) of the Act, 21 U.S.C.section 360bbb-3(b)(1), unless the authorization is terminated  or revoked sooner.       Influenza A by PCR NEGATIVE NEGATIVE Final   Influenza B by PCR NEGATIVE NEGATIVE Final    Comment: (NOTE) The Xpert Xpress SARS-CoV-2/FLU/RSV plus assay is intended as an aid in the diagnosis of influenza from Nasopharyngeal swab specimens and should not be used as a sole basis for treatment. Nasal washings and aspirates are unacceptable for Xpert Xpress SARS-CoV-2/FLU/RSV testing.  Fact Sheet for Patients: EntrepreneurPulse.com.au  Fact Sheet for Healthcare Providers: IncredibleEmployment.be  This test is  not yet approved or cleared by the [place] FDA and has been authorized for detection and/or diagnosis of SARS-CoV-2 by FDA under an Emergency Use Authorization (EUA). This EUA will remain in effect (meaning this test can be used) for the duration of the COVID-19 declaration under Section 564(b)(1) of the Act, 21 U.S.C. section 360bbb-3(b)(1), unless the authorization is terminated or revoked.     Resp Syncytial Virus by PCR NEGATIVE NEGATIVE Final    Comment: (NOTE) Fact Sheet for Patients: EntrepreneurPulse.com.au  Fact Sheet for Healthcare Providers: IncredibleEmployment.be  This test is not yet approved or cleared by the [place] FDA and has been authorized for detection and/or diagnosis of SARS-CoV-2 by FDA under an Emergency Use Authorization (EUA). This EUA will remain in effect (meaning this test can be used) for the duration of the COVID-19 declaration under Section 564(b)(1) of the Act, 21 U.S.C. section 360bbb-3(b)(1), unless the authorization is terminated or revoked.  Performed at [facility], [address] [place], [place] [address]   Blood Culture (routine x 2)     Status: None (Preliminary result)   Collection Time: 03/20/22  8:44 PM   Specimen: BLOOD  Result Value Ref Range Status   Specimen Description BLOOD BLOOD RIGHT FOREARM  Final   Special Requests   Final    BOTTLES DRAWN  AEROBIC AND ANAEROBIC Blood Culture adequate volume   Culture   Final    NO GROWTH < 12 HOURS Performed at [facility], [address] [place], [place] [address]    Report Status PENDING  Incomplete     Radiologic Imaging: CT images personally reviewed and interpreted  CT CHEST ABDOMEN PELVIS W CONTRAST  Result Date: 03/20/2022 CLINICAL DATA:  Sepsis and cough. Reported history of type A aortic dissection on CT dated 02/25/2017. EXAM: CT CHEST, ABDOMEN, AND PELVIS WITH CONTRAST TECHNIQUE: Multidetector CT  imaging of the chest, abdomen and pelvis was performed following the standard protocol during bolus administration of intravenous contrast. RADIATION DOSE REDUCTION: This exam was performed according to the departmental dose-optimization program which includes automated exposure control, adjustment of the mA and/or kV according to patient size and/or use of iterative reconstruction technique. CONTRAST:  OMNIPAQUE IOHEXOL 300 MG/ML  SOLN COMPARISON:  CT dated 02/25/2017. FINDINGS: CT CHEST FINDINGS Cardiovascular: There is no cardiomegaly or pericardial effusion. Implantable pacemaker device. Status post repair of ascending aorta. A dissection flap originates in the aortic arch just distal to the takeoff of the left subclavian artery. 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 Tiny focus of density adjacent to the pseudoaneurysm may represent postsurgical changes. A small amount of fluid is not excluded. The origins of the great vessels of the aortic arch remain patent. The dissection flap extends into the abdominal aorta. There has been interval re-expansion of the true lumen since the prior CT. There is increase in the diameter of the aortic arch measuring approximately 4.5 cm (previously 3.7 cm. No periaortic fluid collection. The central pulmonary arteries appear patent. Mediastinum/Nodes: No hilar or mediastinal adenopathy. The esophagus is grossly unremarkable. No mediastinal fluid collection. Lungs/Pleura: Background of emphysema. Faint interstitial coarsening in the right middle lobe and right upper lobe, likely chronic. Atypical infiltrate is less likely. There are bibasilar subpleural atelectasis/scarring. There is trace left pleural effusion. No focal consolidation or pneumothorax. The central airways are patent. Musculoskeletal: Median sternotomy wires. No acute osseous pathology. CT ABDOMEN PELVIS FINDINGS  No intra-abdominal free air or free fluid. Hepatobiliary: The liver is unremarkable. No biliary dilatation. The gallbladder is unremarkable. Pancreas: Unremarkable. No pancreatic ductal dilatation or surrounding inflammatory changes. Spleen: Normal in size without focal abnormality. Adrenals/Urinary Tract: The adrenal glands unremarkable. Atrophic right kidney. A cluster of stones in the upper pole of the left kidney measure up to 13 mm. No hydronephrosis. Areas of cortical scarring involving the upper and middle pole of the left kidney. 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 The urinary bladder is collapsed. Multiple large stones noted within the urinary bladder. There is diffuse thickened appearance of the bladder wall which may be related to chronic bladder infection. Correlation with urinalysis recommended to exclude active cystitis. Stomach/Bowel: There is no bowel obstruction or active inflammation. The appendix is normal. Vascular/Lymphatic: Aortic dissection with flap terminating at the aortic bifurcation. The celiac axis arises from the false lumen and appears patent. The SMA appears patent. The dissection flap extends into the origin of the SMA. The left renal artery is patent and arises from the false lumen. The right renal artery appears patent however slightly hypoperfused. The IMA is patent. Common iliac artery stents noted. The stents appear patent. The external iliac arteries are patent. The IVC is  unremarkable. No portal venous gas. There is no adenopathy. Reproductive: The prostate and seminal vesicles are grossly unremarkable. Other: None Musculoskeletal: Osteopenia with degenerative changes of the spine. Chronic changes of the left hip. No acute osseous pathology. IMPRESSION: 1. Status post repair of the ascending aorta. A dissection flap originates in the aortic arch just distal to the takeoff of the left  subclavian artery and extends to the abdominal aortic bifurcation. There has been interval re-expansion of the true lumen since the prior CT. There is increase in the diameter of the aortic arch measuring approximately 4.5 cm (previously 3.7 cm). No periaortic fluid collection. 2. 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 Vascular surgery consult is advised. 3. Emphysema.  Trace left pleural effusion. 4. No bowel obstruction. Normal appendix. 5. Multiple large stones within the urinary bladder. Diffuse thickened appearance of the bladder wall may be related to chronic bladder infection. Correlation with urinalysis recommended to exclude active cystitis. Electronically Signed   By: Anner Crete M.D.   On: 03/20/2022 21:43   DG Chest Port 1 View  Result Date: 03/20/2022 CLINICAL DATA:  Cough. EXAM: PORTABLE CHEST 1 VIEW COMPARISON:  Chest radiograph dated 02/25/2017 FINDINGS: There is mild diffuse interstitial and peribronchial densities primarily involving the right lung with areas of nodularity and new compared to prior radiograph. Findings may represent chronic bronchitic changes but developing atypical infiltrate is not excluded. Clinical correlation is recommended. No focal consolidation, pleural effusion, or pneumothorax. The cardiac silhouette is within normal limits. There is aneurysmal dilatation of the aortic arch measuring up to 6.1 cm in diameter. There is report of type A dissection on CT dated 02/25/2017. The images of the head CT are not available for comparison. A thin linear lucency to the left of the aortic arch and proximal descending aorta may be artifactual and air within a bronchus. A small pneumomediastinum is not excluded. CT may provide better evaluation. Median sternotomy wires and an implantable device. No acute osseous pathology. IMPRESSION: 1. Possible developing atypical infiltrate in the right lung. 2. 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 Reported type A dissection on the CT dated 02/25/2017. 3. Possible small pneumomediastinum. CT may provide better evaluation. Electronically Signed   By: Anner Crete M.D.   On: 03/20/2022 19:17    Impression/Recommendation:   1.  Neurogenic bladder Secondary to spinal cord ischemia Presently managed with intermittent catheterization every 4 hours When I saw the patient nursing staff was performing intermittent catheterization and only a small amount of urine was obtained which she states was similar to prior cath.  I inflated the balloon and placed the catheter in the region of the bladder neck and a small amount of blood-tinged urine was obtained Recommended leaving catheter in 12-24 hours to monitor urine output.  A bladder scan was performed which was 0 mL.  His bladder was not distended on CT  2.  Bladder calculi Multiple bladder calculi A CT in 2020 showed minute bladder calculi Discussed cystolitholapaxy once his acute episode has resolved.  Due to the multiplicity of stones this will most likely require a staged procedure  3.  Left nephrolithiasis Nonobstructing left renal calculi  4.  Atrophic right kidney  5.  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

## 2022-03-21 NOTE — ED Notes (Addendum)
Urology MD [facility] messaged in regards to indwelling cath still in place, attempting to see if it can stay in. Current output 435mLs red urine with x2 clots. Awaiting response/orders at this time.

## 2022-03-21 NOTE — ED Notes (Addendum)
This RN interrogated the pt's pacemaker per MD. Damita Dunnings, order as the pt states he has not had his pacemaker interrogated in over a year. This RN, has also in and out cath the pt, per MD order with minimal urinary return of 20 ml. The pt's brief has been changed and the pt has been placed on a purewick to assist with keeping the pt dry as he states that he "dribbles" urine at all times. The pt denies any pain or discomfort at this time close monitoring continued.

## 2022-03-22 ENCOUNTER — Encounter: Payer: Self-pay | Admitting: Internal Medicine

## 2022-03-22 DIAGNOSIS — R6520 Severe sepsis without septic shock: Secondary | ICD-10-CM | POA: Diagnosis not present

## 2022-03-22 DIAGNOSIS — I480 Paroxysmal atrial fibrillation: Secondary | ICD-10-CM | POA: Diagnosis not present

## 2022-03-22 DIAGNOSIS — Z950 Presence of cardiac pacemaker: Secondary | ICD-10-CM | POA: Diagnosis not present

## 2022-03-22 DIAGNOSIS — A419 Sepsis, unspecified organism: Secondary | ICD-10-CM | POA: Diagnosis not present

## 2022-03-22 LAB — CBC
HCT: 32.1 % — ABNORMAL LOW (ref 39.0–52.0)
Hemoglobin: 10 g/dL — ABNORMAL LOW (ref 13.0–17.0)
MCH: 28.7 pg (ref 26.0–34.0)
MCHC: 31.2 g/dL (ref 30.0–36.0)
MCV: 92 fL (ref 80.0–100.0)
Platelets: 184 10*3/uL (ref 150–400)
RBC: 3.49 MIL/uL — ABNORMAL LOW (ref 4.22–5.81)
RDW: 14.9 % (ref 11.5–15.5)
WBC: 20.5 10*3/uL — ABNORMAL HIGH (ref 4.0–10.5)
nRBC: 0 % (ref 0.0–0.2)

## 2022-03-22 LAB — URINE CULTURE

## 2022-03-22 LAB — BASIC METABOLIC PANEL
Anion gap: 10 (ref 5–15)
BUN: 17 mg/dL (ref 8–23)
CO2: 19 mmol/L — ABNORMAL LOW (ref 22–32)
Calcium: 7.7 mg/dL — ABNORMAL LOW (ref 8.9–10.3)
Chloride: 105 mmol/L (ref 98–111)
Creatinine, Ser: 1.07 mg/dL (ref 0.61–1.24)
GFR, Estimated: >60 mL/min (ref >60)
Glucose, Bld: 73 mg/dL (ref 70–99)
Potassium: 3 mmol/L — ABNORMAL LOW (ref 3.5–5.1)
Sodium: 134 mmol/L — ABNORMAL LOW (ref 135–145)

## 2022-03-22 LAB — LACTIC ACID, PLASMA: Lactic Acid, Venous: 0.9 mmol/L (ref 0.5–1.9)

## 2022-03-22 LAB — PHOSPHORUS: Phosphorus: 1.2 mg/dL — ABNORMAL LOW (ref 2.5–4.6)

## 2022-03-22 LAB — MAGNESIUM: Magnesium: 1.7 mg/dL (ref 1.7–2.4)

## 2022-03-22 MED ORDER — POTASSIUM PHOSPHATES 15 MMOLE/5ML IV SOLN
30.0000 mmol | Freq: Once | INTRAVENOUS | Status: AC
  Administered 2022-03-22: 30 mmol via INTRAVENOUS
  Filled 2022-03-22: qty 10

## 2022-03-22 MED ORDER — HYDROCHLOROTHIAZIDE 25 MG PO TABS: 25.0000 mg | ORAL_TABLET | Freq: Every day | ORAL | Status: DC

## 2022-03-22 MED ORDER — MAGNESIUM SULFATE 2 GM/50ML IV SOLN
2.0000 g | Freq: Once | INTRAVENOUS | Status: AC
  Administered 2022-03-22: 2 g via INTRAVENOUS
  Filled 2022-03-22: qty 50

## 2022-03-22 MED ORDER — ATORVASTATIN CALCIUM 20 MG PO TABS
40.0000 mg | ORAL_TABLET | Freq: Every evening | ORAL | Status: DC
  Administered 2022-03-22 – 2022-03-26 (×5): 40 mg via ORAL
  Filled 2022-03-22 (×5): qty 2

## 2022-03-22 MED ORDER — METOPROLOL TARTRATE 50 MG PO TABS
50.0000 mg | ORAL_TABLET | Freq: Two times a day (BID) | ORAL | Status: DC
  Administered 2022-03-22 – 2022-03-27 (×10): 50 mg via ORAL
  Filled 2022-03-22 (×2): qty 1
  Filled 2022-03-22 (×2): qty 2
  Filled 2022-03-22 (×5): qty 1
  Filled 2022-03-22: qty 2
  Filled 2022-03-22: qty 1

## 2022-03-22 MED ORDER — METOPROLOL TARTRATE 5 MG/5ML IV SOLN: 5.0000 mg | Freq: Four times a day (QID) | INTRAVENOUS | Status: DC | PRN

## 2022-03-22 NOTE — Hospital Course (Addendum)
K 3.0  2.9  3.0 Creat 1.5  1.2  1.07 HsTrop 13  22 Lactate 3.7  2.3 Inr 1.7

## 2022-03-22 NOTE — Progress Notes (Signed)
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  Patient has neurogenic bladder and self catheterizes every 4 hours.  ED workup shows tachypneic, tachycardic, leukocytosis with a lactic acid of 2.3.  RSV, Influenza, flu negative.  UA shows many bacteria consistent with UTI.  CT abdomen and pelvis showed possible pneumonia as well as increased aneurysmal dilatation compared to 2018 as well as pseudoaneurysm.  ED physician has spoken with vascular surgeon Dr. Imogene Burn who states findings on aorta are likely chronic especially as patient has no chest pain. He recommended treatment for UTI and pneumonia.  Patient is admitted for further management.  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 Continue empiric antibiotics (ceftriaxone and Zithromax).   Continue IV fluid resuscitation. Lactic acid improving with IV fluid resuscitation. Blood  culture shows gram variable rods, sensitivity pending.   UTI with hematuria: Multiple bladder calculi with hematuria: Continue empiric Rocephin. Continue IV fluid resuscitation. Urology is consulted, suggested cystolitholapaxy once acute episode is resolved.   Due to multiplicity of stones this will most likely require a staged procedure. SCD for DVT prophylaxis   Community-acquired pneumonia: Continue empiric antibiotic Rocephin and Zithromax. Continue Antitussives, albuterol as needed, incentive spirometer Continue supplemental oxygen as needed.  Paroxysmal A-fib: Patient with history of paroxysmal A-fib on chronic Eliquis at home, He is also on metoprolol for heart rate control. He went into atrial fibrillation in the setting of sepsis. He is already converted back to the normal sinus rhythm. Eliquis is on hold in the setting of hematuria and for urological procedure. Continue beta-blocker,  may need to consider antiarrhythmic amiodarone for any recurrence.   History of dissecting aneurysm repair in 01/2017: CT chest showing enlargement of aortic arch to 4.5 compared to 3.7 in 2018 with no periaortic fluid collection. 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 Imogene Burn was consulted from ED and said stable findings. Can consider inpatient vascular consult to follow versus outpatient referral.   Acute kidney injury: > Resolved. Creatinine 1.5, presumed AKI Renal functions improved and back to baseline with fluid resuscitation.   Hypokalemia/ hypophosphatemia: Replaced. Continue to monitor  Chronic complete paraplegia: Continue Supportive care.    Peripheral arterial disease: Does not appear to be on any medication at this time.   History of cardiac pacemaker: No acute issues suspected however last interrogated was on 10/2020 at [facility] EP Pacemaker interrogated.   Essential hypertension: Normotensive.  Hold  off  blood pressure medication in the setting of sepsis.   DVT prophylaxis: SCDs Code Status: Full code Family Communication: No family at bed side. Disposition Plan:  Status is: Inpatient Remains inpatient appropriate because: Admitted for sepsis sec.to UTI, PNA, requiring IV antibiotics. Urology is consulted.   Consultants:  Urology  Procedures:None  Antimicrobials: Ceftriaxone and Zithromax  Subjective: Patient was seen and examined at bedside.  Overnight events noted. Patient reports feeling weak and tired.  He went into A-fib then converted back to normal sinus rhythm.  Objective: Vitals:   03/22/22 0015 03/22/22 0500 03/22/22 0655 03/22/22 1200  BP:   126/86 108/74  Pulse: (!) 102  (!) 103 (!) 125  Resp: 18  19 10   Temp:  98.2 F (36.8 C)    TempSrc:  Oral    SpO2: 95%  96% 96%  Weight:      Height:        Intake/Output Summary (Last 24 hours) at 03/22/2022 1552 Last data filed at 03/21/2022 2333 Gross per 24 hour  Intake 350 ml  Output --  Net 350 ml   Filed Weights   03/20/22 1808  Weight: 90.7 kg    Examination:  General exam: Appears comfortable, not in any acute distress.  Deconditioned. Respiratory system: CTA bilaterally, respiratory effort normal, RR 13. Cardiovascular system: S1-S2 heard, regular rate and rhythm, no murmur. Gastrointestinal system: Abdomen is soft, non tender, nondistended, BS+ Central nervous system: Alert and oriented x 3, moves upper extremities. Extremities: No edema, no cyanosis, no clubbing. Skin: No rashes, lesions or ulcers Psychiatry: Judgement and insight appear normal. Mood & affect appropriate.     Data Reviewed: I have personally reviewed following labs and imaging studies  CBC: Recent Labs  Lab 03/20/22 1859 03/21/22 0433 03/22/22 0454  WBC 26.7* 20.5* 20.5*  NEUTROABS 24.8*  --   --   HGB 12.4* 10.1* 10.0*  HCT 40.0 31.7* 32.1*  MCV 94.6 91.9 92.0  PLT 186 157 184   Basic Metabolic Panel: Recent Labs   Lab 03/20/22 1859 03/21/22 0433 03/22/22 0454  NA 133* 135 134*  K 3.0* 2.9* 3.0*  CL 100 106 105  CO2 20* 20* 19*  GLUCOSE 98 82 73  BUN 27* 24* 17  CREATININE 1.50* 1.20 1.07  CALCIUM 8.4* 7.6* 7.7*  MG  --   --  1.7  PHOS  --   --  1.2*   GFR: Estimated Creatinine Clearance: 79 mL/min (by C-G formula based on SCr of 1.07 mg/dL). Liver Function Tests: Recent Labs  Lab 03/20/22 1859  AST 33  ALT 12  ALKPHOS 129*  BILITOT 2.3*  PROT 7.6  ALBUMIN 3.0*   Recent Labs  Lab 03/20/22 1859  LIPASE 31   No results for input(s): "AMMONIA" in the last 168 hours. Coagulation Profile: Recent Labs  Lab 03/21/22 0433  INR 1.7*   Cardiac Enzymes: No results for input(s): "CKTOTAL", "CKMB", "CKMBINDEX", "TROPONINI" in the last 168 hours. BNP (last 3 results) No results for input(s): "PROBNP" in the last 8760 hours. HbA1C: No results for input(s): "HGBA1C" in the last 72 hours. CBG: No results for input(s): "GLUCAP" in the last 168 hours. Lipid Profile: No results for input(s): "CHOL", "HDL", "LDLCALC", "TRIG", "CHOLHDL", "LDLDIRECT" in the last 72 hours. Thyroid Function Tests: No results for input(s): "TSH", "T4TOTAL", "FREET4", "T3FREE", "THYROIDAB" in the last 72 hours. Anemia Panel: No results for input(s): "VITAMINB12", "FOLATE", "FERRITIN", "TIBC", "IRON", "RETICCTPCT" in the last 72 hours. Sepsis Labs: Recent  Labs  Lab 03/20/22 1859 03/20/22 2059 03/21/22 0433  PROCALCITON  --   --  22.70  LATICACIDVEN 3.7* 2.3*  --     Recent Results (from the past 240 hour(s))  Resp panel by RT-PCR (RSV, Flu A&B, Covid) Anterior Nasal Swab     Status: None   Collection Time: 03/20/22  7:00 PM   Specimen: Anterior Nasal Swab  Result Value Ref Range Status   SARS Coronavirus 2 by RT PCR NEGATIVE NEGATIVE Final    Comment: (NOTE) SARS-CoV-2 target nucleic acids are NOT DETECTED.  The SARS-CoV-2 RNA is generally detectable in upper respiratory specimens during the  acute phase of infection. The lowest concentration of SARS-CoV-2 viral copies this assay can detect is 138 copies/mL. A negative result does not preclude SARS-Cov-2 infection and should not be used as the sole basis for treatment or other patient management decisions. A negative result may occur with  improper specimen collection/handling, submission of specimen other than nasopharyngeal swab, presence of viral mutation(s) within the areas targeted by this assay, and inadequate number of viral copies(<138 copies/mL). A negative result must be combined with clinical observations, patient history, and epidemiological information. The expected result is Negative.  Fact Sheet for Patients:  EntrepreneurPulse.com.au  Fact Sheet for Healthcare Providers:  IncredibleEmployment.be  This test is no t yet approved or cleared by the [place] FDA and  has been authorized for detection and/or diagnosis of SARS-CoV-2 by FDA under an Emergency Use Authorization (EUA). This EUA will remain  in effect (meaning this test can be used) for the duration of the COVID-19 declaration under Section 564(b)(1) of the Act, 21 U.S.C.section 360bbb-3(b)(1), unless the authorization is terminated  or revoked sooner.       Influenza A by PCR NEGATIVE NEGATIVE Final   Influenza B by PCR NEGATIVE NEGATIVE Final    Comment: (NOTE) The Xpert Xpress SARS-CoV-2/FLU/RSV plus assay is intended as an aid in the diagnosis of influenza from Nasopharyngeal swab specimens and should not be used as a sole basis for treatment. Nasal washings and aspirates are unacceptable for Xpert Xpress SARS-CoV-2/FLU/RSV testing.  Fact Sheet for Patients: EntrepreneurPulse.com.au  Fact Sheet for Healthcare Providers: IncredibleEmployment.be  This test is not yet approved or cleared by the [place] FDA and has been authorized for detection and/or  diagnosis of SARS-CoV-2 by FDA under an Emergency Use Authorization (EUA). This EUA will remain in effect (meaning this test can be used) for the duration of the COVID-19 declaration under Section 564(b)(1) of the Act, 21 U.S.C. section 360bbb-3(b)(1), unless the authorization is terminated or revoked.     Resp Syncytial Virus by PCR NEGATIVE NEGATIVE Final    Comment: (NOTE) Fact Sheet for Patients: EntrepreneurPulse.com.au  Fact Sheet for Healthcare Providers: IncredibleEmployment.be  This test is not yet approved or cleared by the [place] FDA and has been authorized for detection and/or diagnosis of SARS-CoV-2 by FDA under an Emergency Use Authorization (EUA). This EUA will remain in effect (meaning this test can be used) for the duration of the COVID-19 declaration under Section 564(b)(1) of the Act, 21 U.S.C. section 360bbb-3(b)(1), unless the authorization is terminated or revoked.  Performed at [facility], [address], [place],  [address]   Urine Culture     Status: Abnormal   Collection Time: 03/20/22  8:39 PM   Specimen: In/Out Cath Urine  Result Value Ref Range Status   Specimen Description   Final    IN/OUT CATH URINE Performed at [facility]  [facility], [address] [place], [place] [address]    Special Requests   Final    NONE Performed at [facility], [address] [place], [place] [address]    Culture MULTIPLE SPECIES PRESENT, SUGGEST RECOLLECTION (A)  Final   Report Status 03/22/2022 FINAL  Final  Blood Culture (routine x 2)     Status: Abnormal (Preliminary result)   Collection Time: 03/20/22  8:44 PM   Specimen: BLOOD  Result Value Ref Range Status   Specimen Description   Final    BLOOD BLOOD RIGHT FOREARM Performed at [facility], [address] [place], [place] [address]    Special Requests   Final    BOTTLES DRAWN AEROBIC AND ANAEROBIC Blood Culture  adequate volume Performed at [facility], [address] [place], [place] [address]    Culture  Setup Time (A)  Final    GRAM VARIABLE ROD ANAEROBIC BOTTLE ONLY CRITICAL RESULT CALLED TO, READ BACK BY AND VERIFIED WITH: JUSTIN MILLER 03/21/22 1636 AMK    Culture GRAM VARIABLE ROD (A)  Final   Report Status PENDING  Incomplete  Blood Culture ID Panel (Reflexed)     Status: None   Collection Time: 03/20/22  8:44 PM  Result Value Ref Range Status   Enterococcus faecalis NOT DETECTED NOT DETECTED Final   Enterococcus Faecium NOT DETECTED NOT DETECTED Final   Listeria monocytogenes NOT DETECTED NOT DETECTED Final   Staphylococcus species NOT DETECTED NOT DETECTED Final   Staphylococcus aureus (BCID) NOT DETECTED NOT DETECTED Final   Staphylococcus epidermidis NOT DETECTED NOT DETECTED Final   Staphylococcus lugdunensis NOT DETECTED NOT DETECTED Final   Streptococcus species NOT DETECTED NOT DETECTED Final   Streptococcus agalactiae NOT DETECTED NOT DETECTED Final   Streptococcus pneumoniae NOT DETECTED NOT DETECTED Final   Streptococcus pyogenes NOT DETECTED NOT DETECTED Final   A.calcoaceticus-baumannii NOT DETECTED NOT DETECTED Final   Bacteroides fragilis NOT DETECTED NOT DETECTED Final   Enterobacterales NOT DETECTED NOT DETECTED Final   Enterobacter cloacae complex NOT DETECTED NOT DETECTED Final   Escherichia coli NOT DETECTED NOT DETECTED Final   Klebsiella aerogenes NOT DETECTED NOT DETECTED Final   Klebsiella oxytoca NOT DETECTED NOT DETECTED Final   Klebsiella pneumoniae NOT DETECTED NOT DETECTED Final   Proteus species NOT DETECTED NOT DETECTED Final   Salmonella species NOT DETECTED NOT DETECTED Final   Serratia marcescens NOT DETECTED NOT DETECTED Final   Haemophilus influenzae NOT DETECTED NOT DETECTED Final   Neisseria meningitidis NOT DETECTED NOT DETECTED Final   Pseudomonas aeruginosa NOT DETECTED NOT DETECTED Final   Stenotrophomonas maltophilia NOT  DETECTED NOT DETECTED Final   Candida albicans NOT DETECTED NOT DETECTED Final   Candida auris NOT DETECTED NOT DETECTED Final   Candida glabrata NOT DETECTED NOT DETECTED Final   Candida krusei NOT DETECTED NOT DETECTED Final   Candida parapsilosis NOT DETECTED NOT DETECTED Final   Candida tropicalis NOT DETECTED NOT DETECTED Final   Cryptococcus neoformans/gattii NOT DETECTED NOT DETECTED Final    Comment: Performed at [facility], [address] [place], [place] [address]         Radiology Studies: CT CHEST ABDOMEN PELVIS W CONTRAST  Result Date: 03/20/2022 CLINICAL DATA:  Sepsis and cough. Reported history of type A aortic dissection on CT dated 02/25/2017. EXAM: CT CHEST, ABDOMEN, AND PELVIS WITH CONTRAST TECHNIQUE: Multidetector CT imaging of the chest, abdomen and pelvis was performed following the standard protocol during bolus administration of  intravenous contrast. RADIATION DOSE REDUCTION: This exam was performed according to the departmental dose-optimization program which includes automated exposure control, adjustment of the mA and/or kV according to patient size and/or use of iterative reconstruction technique. CONTRAST:  OMNIPAQUE IOHEXOL 300 MG/ML  SOLN COMPARISON:  CT dated 02/25/2017. FINDINGS: CT CHEST FINDINGS Cardiovascular: There is no cardiomegaly or pericardial effusion. Implantable pacemaker device. Status post repair of ascending aorta. A dissection flap originates in the aortic arch just distal to the takeoff of the left subclavian artery. 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 Tiny focus of density adjacent to the pseudoaneurysm may represent postsurgical changes. A small amount of fluid is not excluded. The origins of the great vessels of the aortic arch remain patent. The dissection flap extends into the abdominal aorta. There has been interval  re-expansion of the true lumen since the prior CT. There is increase in the diameter of the aortic arch measuring approximately 4.5 cm (previously 3.7 cm. No periaortic fluid collection. The central pulmonary arteries appear patent. Mediastinum/Nodes: No hilar or mediastinal adenopathy. The esophagus is grossly unremarkable. No mediastinal fluid collection. Lungs/Pleura: Background of emphysema. Faint interstitial coarsening in the right middle lobe and right upper lobe, likely chronic. Atypical infiltrate is less likely. There are bibasilar subpleural atelectasis/scarring. There is trace left pleural effusion. No focal consolidation or pneumothorax. The central airways are patent. Musculoskeletal: Median sternotomy wires. No acute osseous pathology. CT ABDOMEN PELVIS FINDINGS No intra-abdominal free air or free fluid. Hepatobiliary: The liver is unremarkable. No biliary dilatation. The gallbladder is unremarkable. Pancreas: Unremarkable. No pancreatic ductal dilatation or surrounding inflammatory changes. Spleen: Normal in size without focal abnormality. Adrenals/Urinary Tract: The adrenal glands unremarkable. Atrophic right kidney. A cluster of stones in the upper pole of the left kidney measure up to 13 mm. No hydronephrosis. Areas of cortical scarring involving the upper and middle pole of the left kidney. 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 The urinary bladder is collapsed. Multiple large stones noted within the urinary bladder. There is diffuse thickened appearance of the bladder wall which may be related to chronic bladder infection. Correlation with urinalysis recommended to exclude active cystitis. Stomach/Bowel: There is no bowel obstruction or active inflammation. The appendix is normal. Vascular/Lymphatic: Aortic dissection with flap terminating at the aortic bifurcation. The celiac axis arises from the false lumen  and appears patent. The SMA appears patent. The dissection flap extends into the origin of the SMA. The left renal artery is patent and arises from the false lumen. The right renal artery appears patent however slightly hypoperfused. The IMA is patent. Common iliac artery stents noted. The stents appear patent. The external iliac arteries are patent. The IVC is unremarkable. No portal venous gas. There is no adenopathy. Reproductive: The prostate and seminal vesicles are grossly unremarkable. Other: None Musculoskeletal: Osteopenia with degenerative changes of the spine. Chronic changes of the left hip. No acute osseous pathology. IMPRESSION: 1. Status post repair of the ascending aorta. A dissection flap originates in the aortic arch just distal to the takeoff of the left subclavian artery and extends to the abdominal aortic bifurcation. There has been interval re-expansion of the true lumen since the prior CT. There is increase in the diameter of the aortic arch measuring approximately 4.5 cm (previously 3.7 cm). No periaortic fluid collection. 2. 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 Vascular surgery consult is advised. 3. Emphysema.  Trace left pleural effusion. 4. No bowel obstruction. Normal appendix. 5. Multiple large stones within the urinary bladder. Diffuse thickened appearance of the bladder wall may be related to chronic bladder infection. Correlation with urinalysis recommended to exclude active cystitis. Electronically Signed   By: Elgie Collard M.D.   On: 03/20/2022 21:43   DG Chest Port 1 View  Result Date: 03/20/2022 CLINICAL DATA:  Cough. EXAM: PORTABLE CHEST 1 VIEW COMPARISON:  Chest radiograph dated 02/25/2017 FINDINGS: There is mild diffuse interstitial and peribronchial densities primarily involving the right lung with areas of nodularity and new compared to prior radiograph. Findings may represent chronic bronchitic changes but developing  atypical infiltrate is not excluded. Clinical correlation is recommended. No focal consolidation, pleural effusion, or pneumothorax. The cardiac silhouette is within normal limits. There is aneurysmal dilatation of the aortic arch measuring up to 6.1 cm in diameter. There is report of type A dissection on CT dated 02/25/2017. The images of the head CT are not available for comparison. A thin linear lucency to the left of the aortic arch and proximal descending aorta may be artifactual and air within a bronchus. A small pneumomediastinum is not excluded. CT may provide better evaluation. Median sternotomy wires and an implantable device. No acute osseous pathology. IMPRESSION: 1. Possible developing atypical infiltrate in the right lung. 2. 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 Reported type A dissection on the CT dated 02/25/2017. 3. Possible small pneumomediastinum. CT may provide better evaluation. Electronically Signed   By: Elgie Collard M.D.   On: 03/20/2022 19:17    Scheduled Meds:  atorvastatin  40 mg Oral QPM   guaiFENesin  600 mg Oral BID   metoprolol tartrate  50 mg Oral BID   Continuous Infusions:  azithromycin Stopped (03/21/22 2333)   cefTRIAXone (ROCEPHIN)  IV Stopped (03/21/22 2301)   magnesium sulfate bolus IVPB     potassium PHOSPHATE IVPB (in mmol) 30 mmol (03/22/22 1122)     LOS: 2 days    Time spent: 35 mins   Jodi Kappes, MD [facility]   If 7PM-7AM, please contact night-coverage

## 2022-03-22 NOTE — Consult Note (Signed)
Cardiology Consult    Patient ID: Mitchell Dixon MRN: [id number], DOB/AGE: Nov 11, 1955   Admit date: 03/20/2022 Date of Consult: 03/22/2022  Primary Physician: Emogene Morgan, MD Primary Cardiologist: Lorine Bears, MD - new; prev followed @ [facility] Requesting Provider: Casper Harrison, MD  Patient Profile    Beto Fougerousse is a 67 y.o. 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 Lucianne Muss.  Past Medical History   Past Medical History:  Diagnosis Date   Ascending aortic dissection (HCC)    a. 01/2017 s/p emergent repair @ [facility].   Cardiac pacemaker in situ    a. 03/2017 s/p MDT Micra VR TCP [id number] (ser# [id number]).   Chronic HFrEF (heart failure with reduced ejection fraction) (HCC)    a. 06/2016 Echo: EF 45%, diff HK, GrI DD; b. 03/2017 Echo: EF 55-60%, apical wma, Gr2 DD; c. 06/2017 Echo: EF 55-60%, apical, apical inf, and mid antsept wma; d. 07/2018 Echo: EF 45%, apical thinning & AK. Dil Asc Ao.   Essential hypertension    LV (left ventricular) mural thrombus    a. 06/2016 Echo: EF 45%, diff HK w/ thrombus (0.9 x 0.9 x.0.7cm), GrI DD, mild AI/MR.   Mixed hyperlipidemia    PAD (peripheral artery disease) (HCC)    PAF (paroxysmal atrial fibrillation) (HCC)    a. CHA2DS2VASc = 5-->chronic eliquis.   Paraplegia (HCC)    a.  Spinal cord ischemia and injury in the setting of aortic dissection in December 2018.   Stroke [facility]) 06/2016   Urinary incontinence    a. in setting of paraplegia - self catheterizes.    Past Surgical History:  Procedure Laterality Date   DIALYSIS/PERMA CATHETER INSERTION N/A 02/25/2017    Procedure: DIALYSIS/PERMA CATHETER INSERTION;  Surgeon: Annice Needy, MD;  Location: [facility] INVASIVE CV LAB;  Service: Cardiovascular;  Laterality: N/A;   EMBOLECTOMY Right 02/23/2017   Procedure: EMBOLECTOMY;  Surgeon: Renford Dills, MD;  Location: [facility] ORS;  Service: Vascular;  Laterality: Right;   FASCIECTOMY Right 02/23/2017   Procedure: FASCIECTOMY;  Surgeon: Renford Dills, MD;  Location: [facility] ORS;  Service: Vascular;  Laterality: Right;   HERNIA REPAIR       Allergies  No Known Allergies  History of Present Illness    67 year old male with the above complex past medical history including type A aortic dissection, 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  In May 2018, he was admitted to [facility] with hypertensive urgency and complaint of recurrent falls and right leg weakness.  He was found to have acute stroke on MRI of the brain involving the left corona radiata and caudate body.  Echo showed an EF of 45% with diffuse hypokinesis and an LV thrombus, and he was treated with Eliquis.  In December 2018, he was admitted to [facility] with a several day history of acute onset of abdominal lower extremity pain.  CTA revealed occlusion of the right common iliac artery and complete absence of flow to the entire right leg.  He went to the OR for urgent  evaluation and right lower extremity embolectomy and stenting of the right and left common iliac arteries, left external iliac artery, open cutdown of the right common femoral artery, and 4 compartment fasciotomies to the right calf.  He subsequently experienced paraplegia with nearly total loss of all sensation bilateral lower extremities.  CT imaging revealed type a aortic dissection extending into the aortic bifurcation.  He was transferred to [facility] where he subsequently underwent repair with  prolonged hospitalization of approximately 3 months.  During hospitalization, he required placement of a Medtronic Micra RV lead permanent pacemaker.  He apparently also had paroxysmal atrial fibrillation.  He has been chronically anticoagulated with Eliquis.  Most recent echo in June 2020 showed an EF of 45% with apical thinning and akinesis.  He has not been seen by [facility] cardiology in several years, and notes that he was lost to follow-up in the setting of the pandemic.  Patient lives locally and has been paraplegic since his aortic dissection.  He spends most of his time in bed, as he was experiencing frequent episodes of lightheadedness and syncope when sitting up in the past.  He is followed at [facility] clinic and no longer sees cardiology or electrophysiology.  He was in his usual state of health until approximately 1 week ago, when he started experiencing generalized malaise with cough, weakness, and poor oral intake.  In the ED, he was found to be in sinus tachycardia at 92 without acute ST or T changes.  His white count was elevated at 26,000 with a lactic acid of 2.3.  Respiratory panel was negative for COVID, flu, and RSV.  Urinalysis showed turbid, red urine with many bacteria.  Creatinine was elevated above baseline at 1.5 and he was hypokalemic at 3.0.  Initial troponin was 13 with subsequent value of 22.  CT a of the chest abdomen and pelvis shows a dissection flap originating in the aortic arch just distal to the takeoff of the left subclavian artery extending to the abdominal aortic bifurcation with interval reexpansion of the true lumen.  Aortic arch measuring 4.5 cm.  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  Multiple large stones within the urinary bladder with diffuse thickened appearance of the bladder wall, suggestive of chronic bladder infection noted.  He has been treated for sepsis, UTI, and community-acquired pneumonia.  He was also seen by  vascular surgery (CT findings felt to be stable), and urology with plan for cystolitholapaxy in a staged fashion.  He has remained hypokalemic throughout admission with a potassium of 3.0 this morning.  Acute kidney injury did improve with IV fluids.  Patient says that he started coughing approximately 3 AM this morning with subsequent development of sinus tachycardia.  At 11:01 AM, he was noted to be in rapid atrial fibrillation on telemetry with rates into the 160s.  He has been managed with metoprolol 50 mg twice daily and converted back to sinus rhythm @ 13:42 - currently rates in the 80's to 90's.  Inpatient Medications     atorvastatin  40 mg Oral QPM   guaiFENesin  600 mg Oral BID   metoprolol tartrate  50 mg Oral BID    Family History    Family History  Problem Relation Age of Onset   Dementia Mother    Hypertension Mother    Pneumonia Father    Hypertension Sister    He indicated that his mother is deceased. He indicated that his father is deceased. He  indicated that his sister is alive.   Social History    Social History   Socioeconomic History   Marital status: Single    Spouse name: Not on file   Number of children: Not on file   Years of education: Not on file   Highest education level: Not on file  Occupational History   Not on file  Tobacco Use   Smoking status: Every Day    Packs/day: 0.25    Years: 30.00    Total pack years: 7.50    Types: Cigarettes   Smokeless tobacco: Never  Substance and Sexual Activity   Alcohol use: No   Drug use: No   Sexual activity: Never  Other Topics Concern   Not on file  Social History Narrative   Lives locally.  Mostly bedridden in setting of hypotension and presyncope/syncope when sitting upright.   Social Determinants of Health   Financial Resource Strain: Not on file  Food Insecurity: Not on file  Transportation Needs: Not on file  Physical Activity: Not on file  Stress: Not on file  Social Connections: Not on  file  Intimate Partner Violence: Not on file     Review of Systems    General:  +++ malaise, anorexia, no chills, fever, night sweats or weight changes.  Cardiovascular:  No chest pain, +++ dyspnea on exertion, no edema, orthopnea, palpitations, paroxysmal nocturnal dyspnea. Dermatological: No rash, lesions/masses Respiratory: +++ cough, dyspnea Urologic: No hematuria, dysuria Abdominal:   +++ nausea prior to admission, +++ occas vomiting prior to admission, diarrhea, bright red blood per rectum, melena, or hematemesis Neurologic:  Paraplegic.  No new visual changes, wkns, changes in mental status. All other systems reviewed and are otherwise negative except as noted above.  Physical Exam    Blood pressure 108/74, pulse (!) 125, temperature 98.2 F (36.8 C), temperature source Oral, resp. rate 10, height 5\' 10"  (1.778 m), weight 90.7 kg, SpO2 96 %.  General: Pleasant, NAD Psych: Normal affect. Neuro: Alert and oriented X 3. Moves upper extremities. HEENT: Normal  Neck: Supple without bruits or JVD. Lungs:  Resp regular and unlabored, diminished breath sounds bilaterally. Heart: RRR no s3, s4, or murmurs. Abdomen: Soft, non-tender, non-distended, BS + x 4.  Extremities: No clubbing, cyanosis.  1+ bilateral ankle edema.  Contractures of bilateral feet.  DP/PT2+, Radials 2+ and equal bilaterally.  Labs    Cardiac Enzymes Recent Labs  Lab 03/20/22 1859 03/20/22 2059  TROPONINIHS 13 22*      Lab Results  Component Value Date   WBC 20.5 (H) 03/22/2022   HGB 10.0 (L) 03/22/2022   HCT 32.1 (L) 03/22/2022   MCV 92.0 03/22/2022   PLT 184 03/22/2022    Recent Labs  Lab 03/20/22 1859 03/21/22 0433 03/22/22 0454  NA 133*   < > 134*  K 3.0*   < > 3.0*  CL 100   < > 105  CO2 20*   < > 19*  BUN 27*   < > 17  CREATININE 1.50*   < > 1.07  CALCIUM 8.4*   < > 7.7*  PROT 7.6  --   --   BILITOT 2.3*  --   --   ALKPHOS 129*  --   --   ALT 12  --   --   AST 33  --   --    GLUCOSE 98   < > 73   < > = values in this interval not displayed.  Lab Results  Component Value Date   CHOL 114 07/20/2016   HDL 28 (L) 07/20/2016   LDLCALC 67 07/20/2016   TRIG 95 07/20/2016     Radiology Studies    CT CHEST ABDOMEN PELVIS W CONTRAST  Result Date: 03/20/2022 CLINICAL DATA:  Sepsis and cough. Reported history of type A aortic dissection on CT dated 02/25/2017. EXAM: CT CHEST, ABDOMEN, AND PELVIS WITH CONTRAST TECHNIQUE: Multidetector CT imaging of the chest, abdomen and pelvis was performed following the standard protocol during bolus administration of intravenous contrast. RADIATION DOSE REDUCTION: This exam was performed according to the departmental dose-optimization program which includes automated exposure control, adjustment of the mA and/or kV according to patient size and/or use of iterative reconstruction technique. CONTRAST:  OMNIPAQUE IOHEXOL 300 MG/ML  SOLN COMPARISON:  CT dated 02/25/2017. FINDINGS: CT CHEST FINDINGS Cardiovascular: There is no cardiomegaly or pericardial effusion. Implantable pacemaker device. Status post repair of ascending aorta. A dissection flap originates in the aortic arch just distal to the takeoff of the left subclavian artery. 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 Tiny focus of density adjacent to the pseudoaneurysm may represent postsurgical changes. A small amount of fluid is not excluded. The origins of the great vessels of the aortic arch remain patent. The dissection flap extends into the abdominal aorta. There has been interval re-expansion of the true lumen since the prior CT. There is increase in the diameter of the aortic arch measuring approximately 4.5 cm (previously 3.7 cm. No periaortic fluid collection. The central pulmonary arteries appear patent. Mediastinum/Nodes: No hilar or mediastinal adenopathy. The esophagus is  grossly unremarkable. No mediastinal fluid collection. Lungs/Pleura: Background of emphysema. Faint interstitial coarsening in the right middle lobe and right upper lobe, likely chronic. Atypical infiltrate is less likely. There are bibasilar subpleural atelectasis/scarring. There is trace left pleural effusion. No focal consolidation or pneumothorax. The central airways are patent. Musculoskeletal: Median sternotomy wires. No acute osseous pathology. CT ABDOMEN PELVIS FINDINGS No intra-abdominal free air or free fluid. Hepatobiliary: The liver is unremarkable. No biliary dilatation. The gallbladder is unremarkable. Pancreas: Unremarkable. No pancreatic ductal dilatation or surrounding inflammatory changes. Spleen: Normal in size without focal abnormality. Adrenals/Urinary Tract: The adrenal glands unremarkable. Atrophic right kidney. A cluster of stones in the upper pole of the left kidney measure up to 13 mm. No hydronephrosis. Areas of cortical scarring involving the upper and middle pole of the left kidney. 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 The urinary bladder is collapsed. Multiple large stones noted within the urinary bladder. There is diffuse thickened appearance of the bladder wall which may be related to chronic bladder infection. Correlation with urinalysis recommended to exclude active cystitis. Stomach/Bowel: There is no bowel obstruction or active inflammation. The appendix is normal. Vascular/Lymphatic: Aortic dissection with flap terminating at the aortic bifurcation. The celiac axis arises from the false lumen and appears patent. The SMA appears patent. The dissection flap extends into the origin of the SMA. The left renal artery is patent and arises from the false lumen. The right renal artery appears patent however slightly hypoperfused. The IMA is patent. Common iliac artery stents noted. The stents appear  patent. The external iliac arteries are patent. The IVC is unremarkable. No portal venous gas. There is no adenopathy. Reproductive: The prostate and seminal vesicles are grossly unremarkable.  Other: None Musculoskeletal: Osteopenia with degenerative changes of the spine. Chronic changes of the left hip. No acute osseous pathology. IMPRESSION: 1. Status post repair of the ascending aorta. A dissection flap originates in the aortic arch just distal to the takeoff of the left subclavian artery and extends to the abdominal aortic bifurcation. There has been interval re-expansion of the true lumen since the prior CT. There is increase in the diameter of the aortic arch measuring approximately 4.5 cm (previously 3.7 cm). No periaortic fluid collection. 2. 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 Vascular surgery consult is advised. 3. Emphysema.  Trace left pleural effusion. 4. No bowel obstruction. Normal appendix. 5. Multiple large stones within the urinary bladder. Diffuse thickened appearance of the bladder wall may be related to chronic bladder infection. Correlation with urinalysis recommended to exclude active cystitis. Electronically Signed   By: Elgie Collard M.D.   On: 03/20/2022 21:43   DG Chest Port 1 View  Result Date: 03/20/2022 CLINICAL DATA:  Cough. EXAM: PORTABLE CHEST 1 VIEW COMPARISON:  Chest radiograph dated 02/25/2017 FINDINGS: There is mild diffuse interstitial and peribronchial densities primarily involving the right lung with areas of nodularity and new compared to prior radiograph. Findings may represent chronic bronchitic changes but developing atypical infiltrate is not excluded. Clinical correlation is recommended. No focal consolidation, pleural effusion, or pneumothorax. The cardiac silhouette is within normal limits. There is aneurysmal dilatation of the aortic arch measuring up to 6.1 cm in diameter. There is report of type A dissection on  CT dated 02/25/2017. The images of the head CT are not available for comparison. A thin linear lucency to the left of the aortic arch and proximal descending aorta may be artifactual and air within a bronchus. A small pneumomediastinum is not excluded. CT may provide better evaluation. Median sternotomy wires and an implantable device. No acute osseous pathology. IMPRESSION: 1. Possible developing atypical infiltrate in the right lung. 2. 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 Reported type A dissection on the CT dated 02/25/2017. 3. Possible small pneumomediastinum. CT may provide better evaluation. Electronically Signed   By: Elgie Collard M.D.   On: 03/20/2022 19:17    ECG & Cardiac Imaging    Regular sinus rhythm, 92, septal infarct- personally reviewed. Telemetry: Converted to rapid atrial fibrillation 11:01 with return to sinus rhythm at 13:42.  Assessment & Plan    1.  Paroxysmal atrial fibrillation: Patient with a history of paroxysmal atrial fibrillation on chronic Eliquis at home.  He also uses metoprolol 50 mg twice daily.  He has been hospitalized and in the emergency department since January 20 in the setting of a 1 week history of malaise, cough, and nausea, and has been treated for sepsis, UTI, and pneumonia.  At 11:01 this morning, he converted to atrial fibrillation with rapid ventricular response, with associated tachypalpitations.  He is already converted back to sinus rhythm as of 13:42.  He is currently asymptomatic.  CHA2DS2-VASc equals 5.  Home dose Eliquis currently on hold in the setting of hematuria with plan for urologic instrumentation.  Continue beta-blocker and may need to consider antiarrhythmic therapy-amiodarone for any recurrence.  Follow-up echo.  Try and maintain potassium at 4.0 and magnesium at 2.0.  2.  Sepsis/UTI/pneumonia: Antibiotics per primary team.  Anticoagulation on hold in the setting of ongoing hematuria.  3.  Bladder calculi: Evaluated by  urology with plan for staged cystolitholapaxy.  4.  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  Patient notes that over the past few years, he has become increasingly bedridden as result of presyncope and syncope that might occur simply while sitting up.  Would likely benefit from lower extremity compression and abdominal binder.  5.  Hyperlipidemia: Continue statin therapy.  6.  History of type a aortic dissection: Status post repair at [facility] with CTA this admission.  Evaluated by vascular surgery and findings felt to be stable with recommendation for outpatient vascular surgery follow-up.  7.  Paraplegia: Following spinal cord ischemia and injury in the setting of aortic dissection in late 2018.  8.  Hypokalemia: Currently receiving intravenous supplementation.  Magnesium 1.7-in the setting of atrial fibrillation, will give 2 g.  9.  Chronic heart failure with midrange ejection fraction: EF 45% with apical thinning in June 2020.  Follow-up echo.  10.  Normocytic anemia: Relatively stable.  Follow.  11.  Permanent pacemaker in situ: Status post Medtronic Micra at [facility] in 2019 following aortic dissection surgery.  Device interrogated on January 21 showing 99.9% ventricular sensing and less than 0.1% ventricular pacing.  Remaining battery greater than 8 years.  We will need to get him plugged in with electrophysiology locally in our office, as he is no longer able to travel to [facility] for ongoing care there, and has not been seen in some time there.  Risk Assessment/Risk Scores:          CHA2DS2-VASc Score = 5   This indicates a 7.2% annual risk of stroke. The patient's score is based upon: CHF History: 1 HTN History: 1 Diabetes History: 0 Stroke History: 2 Vascular Disease History: 1 Age Score: 0 Gender Score: 0     Signed, Nicolasa Ducking, NP 03/22/2022, 2:37 PM  For questions or updates, please contact   Please consult [email or web address]  for contact info under Cardiology/STEMI.

## 2022-03-22 NOTE — ED Notes (Signed)
Pt HR ST and presents like Afib.  MD Notified. MD will restart Metoprolol

## 2022-03-23 ENCOUNTER — Inpatient Hospital Stay (HOSPITAL_COMMUNITY)
Admit: 2022-03-23 | Discharge: 2022-03-23 | Disposition: A | Discharge status: Home / Self Care | Payer: Medicaid Other | Attending: Nurse Practitioner | Admitting: Nurse Practitioner

## 2022-03-23 DIAGNOSIS — A419 Sepsis, unspecified organism: Secondary | ICD-10-CM | POA: Diagnosis not present

## 2022-03-23 DIAGNOSIS — I480 Paroxysmal atrial fibrillation: Secondary | ICD-10-CM | POA: Diagnosis not present

## 2022-03-23 DIAGNOSIS — I4891 Unspecified atrial fibrillation: Secondary | ICD-10-CM | POA: Diagnosis not present

## 2022-03-23 DIAGNOSIS — Z950 Presence of cardiac pacemaker: Secondary | ICD-10-CM | POA: Diagnosis not present

## 2022-03-23 DIAGNOSIS — R6520 Severe sepsis without septic shock: Secondary | ICD-10-CM | POA: Diagnosis not present

## 2022-03-23 LAB — ECHOCARDIOGRAM COMPLETE
Height: 70 in
S' Lateral: 2.9 cm
Weight: 3200 oz

## 2022-03-23 LAB — BASIC METABOLIC PANEL
Anion gap: 7 (ref 5–15)
BUN: 14 mg/dL (ref 8–23)
CO2: 22 mmol/L (ref 22–32)
Calcium: 7.5 mg/dL — ABNORMAL LOW (ref 8.9–10.3)
Chloride: 104 mmol/L (ref 98–111)
Creatinine, Ser: 1.12 mg/dL (ref 0.61–1.24)
GFR, Estimated: >60 mL/min (ref >60)
Glucose, Bld: 87 mg/dL (ref 70–99)
Potassium: 3.1 mmol/L — ABNORMAL LOW (ref 3.5–5.1)
Sodium: 133 mmol/L — ABNORMAL LOW (ref 135–145)

## 2022-03-23 LAB — MAGNESIUM: Magnesium: 2.1 mg/dL (ref 1.7–2.4)

## 2022-03-23 MED ORDER — POTASSIUM CHLORIDE CRYS ER 20 MEQ PO TBCR
40.0000 meq | EXTENDED_RELEASE_TABLET | Freq: Two times a day (BID) | ORAL | Status: DC
  Administered 2022-03-23 – 2022-03-25 (×4): 40 meq via ORAL
  Filled 2022-03-23 (×4): qty 2

## 2022-03-23 MED ORDER — AZITHROMYCIN 250 MG PO TABS
500.0000 mg | ORAL_TABLET | Freq: Every day | ORAL | Status: AC
  Administered 2022-03-23 – 2022-03-24 (×2): 500 mg via ORAL
  Filled 2022-03-23 (×2): qty 2

## 2022-03-23 MED ORDER — METRONIDAZOLE 500 MG/100ML IV SOLN
500.0000 mg | Freq: Two times a day (BID) | INTRAVENOUS | Status: DC
  Administered 2022-03-23 – 2022-03-25 (×5): 500 mg via INTRAVENOUS
  Filled 2022-03-23 (×5): qty 100

## 2022-03-23 MED ORDER — APIXABAN 5 MG PO TABS
5.0000 mg | ORAL_TABLET | Freq: Two times a day (BID) | ORAL | Status: DC
  Administered 2022-03-23 – 2022-03-27 (×9): 5 mg via ORAL
  Filled 2022-03-23 (×9): qty 1

## 2022-03-23 MED ORDER — POTASSIUM CHLORIDE 20 MEQ PO PACK
40.0000 meq | PACK | Freq: Once | ORAL | Status: AC
  Administered 2022-03-23: 40 meq via ORAL
  Filled 2022-03-23: qty 2

## 2022-03-23 NOTE — ED Notes (Signed)
Patient provided with ice water per request. Patient resting in bed free from sign of distress. Breathing unlabored speaking in full sentences with symmetric chest rise and fall. Bed low and locked with side rails raised x2. Call bell in reach and monitor in place.

## 2022-03-23 NOTE — Progress Notes (Signed)
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  Patient has neurogenic bladder and self catheterizes every 4 hours.  ED workup shows tachypneic, tachycardic, leukocytosis with a lactic acid of 2.3.  RSV, Influenza, flu negative.  UA shows many bacteria consistent with UTI.  CT abdomen and pelvis showed possible pneumonia as well as increased aneurysmal dilatation compared to 2018 as well as pseudoaneurysm.  ED physician has spoken with vascular surgeon Dr. Bridgett Larsson who states findings on aorta are likely chronic especially as patient has no chest pain. He recommended treatment for UTI and pneumonia.  Patient is admitted for further management.  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 Continue empiric antibiotics (ceftriaxone and Zithromax).   Continue IV fluid resuscitation. Lactic acid improving with IV fluid resuscitation. Blood  culture shows gram variable rods, sensitivity pending.   UTI with hematuria: Multiple bladder calculi with hematuria: Continue empiric Rocephin. Continue IV fluid resuscitation. Urology is consulted, suggested cystolitholapaxy once acute episode is resolved.   Due to multiplicity of stones this will most likely require a staged procedure. SCD for DVT prophylaxis.   Community-acquired pneumonia: Continue empiric antibiotic Rocephin and Zithromax. Continue Antitussives, albuterol as needed, incentive spirometer Continue supplemental oxygen as needed.  Paroxysmal A-fib: Patient with history of paroxysmal A-fib on chronic Eliquis at home, He is also on metoprolol for heart rate control. He went into atrial fibrillation in the setting of sepsis. He is already converted back to the normal sinus rhythm. Eliquis is on hold in the setting of hematuria and for urological procedure. Continue beta-blocker,  may need to consider antiarrhythmic amiodarone for any recurrence.  History of dissecting aneurysm repair in 01/2017: CT chest showing enlargement of aortic arch to 4.5 compared to 3.7 in 2018 with no periaortic fluid collection. 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 Bridgett Larsson was consulted from ED and said stable findings. Can consider inpatient vascular consult to follow versus outpatient referral.   Acute kidney injury: > Resolved. Creatinine 1.5, presumed AKI Renal functions improved and back to baseline with fluid resuscitation.   Hypokalemia/ hypophosphatemia: Replaced. Continue to monitor  Chronic complete paraplegia: Continue Supportive care.    Peripheral arterial disease: Does not appear to be on any medication at this time.   History of cardiac pacemaker: No acute issues suspected however last interrogated was on 10/2020 at [facility] interrogated.   Essential hypertension: Normotensive.  Hold off  blood pressure medication in the setting of sepsis.   DVT prophylaxis: SCDs Code Status: Full code Family Communication: No family at bed side. Disposition Plan:  Status is: Inpatient Remains inpatient appropriate because: Admitted for sepsis sec.to UTI, PNA, requiring IV antibiotics. Urology is consulted.   Consultants:  Urology  Procedures:None  Antimicrobials: Ceftriaxone and Zithromax  Subjective: Patient was seen and examined at bedside.  Overnight events noted. Patient reports feeling weak and tired.  His heart rate is now controlled and remains in sinus rhythm. He denies any chest pain or shortness of breath.  Objective: Vitals:   03/23/22 0400 03/23/22 0800 03/23/22 1132 03/23/22 1200  BP: 112/74 108/73  126/88  Pulse: 84 88  98  Resp: 17 15  13   Temp: 98.1 F (36.7 C)  97.7 F (36.5 C)   TempSrc: Oral  Oral   SpO2: 96% 96%  98%  Weight:      Height:        Intake/Output Summary (Last 24 hours) at 03/23/2022 1611 Last data filed at 03/23/2022 1147 Gross per 24 hour  Intake 350 ml  Output 1900 ml  Net -1550 ml   Filed Weights   03/20/22 1808  Weight: 90.7 kg    Examination:  General exam: Appears comfortable, not in any acute distress.  Deconditioned Respiratory system: CTA bilaterally, respiratory effort normal, RR 13. Cardiovascular system: S1-S2 heard, regular rate and rhythm, no murmur. Gastrointestinal system: Abdomen is soft, non tender, nondistended, BS+ Central nervous system: Alert and oriented x 3, moves upper extremities. Extremities: No edema, no cyanosis, no clubbing. Skin: No rashes, lesions or ulcers Psychiatry: Judgement and insight appear normal. Mood & affect appropriate.     Data Reviewed: I have personally reviewed following labs and imaging studies  CBC: Recent Labs  Lab 03/20/22 1859 03/21/22 0433 03/22/22 0454  WBC 26.7* 20.5* 20.5*  NEUTROABS 24.8*  --   --   HGB 12.4* 10.1* 10.0*  HCT 40.0 31.7* 32.1*  MCV 94.6  91.9 92.0  PLT 186 157 [id number]   Basic Metabolic Panel: Recent Labs  Lab 03/20/22 1859 03/21/22 0433 03/22/22 0454 03/23/22 0509  NA 133* 135 134* 133*  K 3.0* 2.9* 3.0* 3.1*  CL 100 106 105 104  CO2 20* 20* 19* 22  GLUCOSE 98 82 73 87  BUN 27* 24* 17 14  CREATININE 1.50* 1.20 1.07 1.12  CALCIUM 8.4* 7.6* 7.7* 7.5*  MG  --   --  1.7 2.1  PHOS  --   --  1.2*  --    GFR: Estimated Creatinine Clearance: 75.5 mL/min (by C-G formula based on SCr of 1.12 mg/dL). Liver Function Tests: Recent Labs  Lab 03/20/22 1859  AST 33  ALT 12  ALKPHOS 129*  BILITOT 2.3*  PROT 7.6  ALBUMIN 3.0*   Recent Labs  Lab 03/20/22 1859  LIPASE 31   No results for input(s): "AMMONIA" in the last 168 hours. Coagulation Profile: Recent Labs  Lab 03/21/22 0433  INR 1.7*   Cardiac Enzymes: No results for input(s): "CKTOTAL", "CKMB", "CKMBINDEX", "TROPONINI" in the last 168 hours. BNP (last 3 results) No results for input(s): "PROBNP" in the last 8760 hours. HbA1C: No results for input(s): "HGBA1C" in the last 72 hours. CBG: No results for input(s): "GLUCAP" in the last 168 hours. Lipid Profile: No results for input(s): "CHOL", "HDL", "LDLCALC", "TRIG", "CHOLHDL", "LDLDIRECT" in the last 72 hours. Thyroid Function Tests: No results for input(s): "TSH", "T4TOTAL", "FREET4", "T3FREE", "THYROIDAB" in the last  72 hours. Anemia Panel: No results for input(s): "VITAMINB12", "FOLATE", "FERRITIN", "TIBC", "IRON", "RETICCTPCT" in the last 72 hours. Sepsis Labs: Recent Labs  Lab 03/20/22 1859 03/20/22 2059 03/21/22 0433 03/22/22 2224  PROCALCITON  --   --  22.70  --   LATICACIDVEN 3.7* 2.3*  --  0.9    Recent Results (from the past 240 hour(s))  Resp panel by RT-PCR (RSV, Flu A&B, Covid) Anterior Nasal Swab     Status: None   Collection Time: 03/20/22  7:00 PM   Specimen: Anterior Nasal Swab  Result Value Ref Range Status   SARS Coronavirus 2 by RT PCR NEGATIVE NEGATIVE Final    Comment:  (NOTE) SARS-CoV-2 target nucleic acids are NOT DETECTED.  The SARS-CoV-2 RNA is generally detectable in upper respiratory specimens during the acute phase of infection. The lowest concentration of SARS-CoV-2 viral copies this assay can detect is 138 copies/mL. A negative result does not preclude SARS-Cov-2 infection and should not be used as the sole basis for treatment or other patient management decisions. A negative result may occur with  improper specimen collection/handling, submission of specimen other than nasopharyngeal swab, presence of viral mutation(s) within the areas targeted by this assay, and inadequate number of viral copies(<138 copies/mL). A negative result must be combined with clinical observations, patient history, and epidemiological information. The expected result is Negative.  Fact Sheet for Patients:  EntrepreneurPulse.com.au  Fact Sheet for Healthcare Providers:  IncredibleEmployment.be  This test is no t yet approved or cleared by the [place] FDA and  has been authorized for detection and/or diagnosis of SARS-CoV-2 by FDA under an Emergency Use Authorization (EUA). This EUA will remain  in effect (meaning this test can be used) for the duration of the COVID-19 declaration under Section 564(b)(1) of the Act, 21 U.S.C.section 360bbb-3(b)(1), unless the authorization is terminated  or revoked sooner.       Influenza A by PCR NEGATIVE NEGATIVE Final   Influenza B by PCR NEGATIVE NEGATIVE Final    Comment: (NOTE) The Xpert Xpress SARS-CoV-2/FLU/RSV plus assay is intended as an aid in the diagnosis of influenza from Nasopharyngeal swab specimens and should not be used as a sole basis for treatment. Nasal washings and aspirates are unacceptable for Xpert Xpress SARS-CoV-2/FLU/RSV testing.  Fact Sheet for Patients: EntrepreneurPulse.com.au  Fact Sheet for Healthcare  Providers: IncredibleEmployment.be  This test is not yet approved or cleared by the [place] FDA and has been authorized for detection and/or diagnosis of SARS-CoV-2 by FDA under an Emergency Use Authorization (EUA). This EUA will remain in effect (meaning this test can be used) for the duration of the COVID-19 declaration under Section 564(b)(1) of the Act, 21 U.S.C. section 360bbb-3(b)(1), unless the authorization is terminated or revoked.     Resp Syncytial Virus by PCR NEGATIVE NEGATIVE Final    Comment: (NOTE) Fact Sheet for Patients: EntrepreneurPulse.com.au  Fact Sheet for Healthcare Providers: IncredibleEmployment.be  This test is not yet approved or cleared by the [place] FDA and has been authorized for detection and/or diagnosis of SARS-CoV-2 by FDA under an Emergency Use Authorization (EUA). This EUA will remain in effect (meaning this test can be used) for the duration of the COVID-19 declaration under Section 564(b)(1) of the Act, 21 U.S.C. section 360bbb-3(b)(1), unless the authorization is terminated or revoked.  Performed at [facility], [address], [place], [place] [address]   Urine Culture     Status: Abnormal   Collection Time: 03/20/22  8:39 PM  Specimen: In/Out Cath Urine  Result Value Ref Range Status   Specimen Description   Final    IN/OUT CATH URINE Performed at [facility], [address], [place], [place] [address]    Special Requests   Final    NONE Performed at [facility], [address] [place], [place] [address]    Culture MULTIPLE SPECIES PRESENT, SUGGEST RECOLLECTION (A)  Final   Report Status 03/22/2022 FINAL  Final  Blood Culture (routine x 2)     Status: Abnormal (Preliminary result)   Collection Time: 03/20/22  8:44 PM   Specimen: BLOOD  Result Value Ref Range Status   Specimen Description   Final    BLOOD BLOOD RIGHT  FOREARM Performed at [facility], [address], [place], [place] [address]    Special Requests   Final    BOTTLES DRAWN AEROBIC AND ANAEROBIC Blood Culture adequate volume Performed at [facility], [address], [place], [place] [address]    Culture  Setup Time (A)  Final    GRAM VARIABLE ROD ANAEROBIC BOTTLE ONLY CRITICAL RESULT CALLED TO, READ BACK BY AND VERIFIED WITH: JUSTIN MILLER 03/21/22 1636 AMK    Culture (A)  Final    CLOSTRIDIUM RAMOSUM PROVIDENCIA STUARTII SUSCEPTIBILITIES TO FOLLOW Standardized susceptibility testing for this organism is not available. FOR CLOSTRIDIUM RAMOSUM Performed at [facility], [place] [address] [place], [place] [address]    Report Status PENDING  Incomplete  Blood Culture ID Panel (Reflexed)     Status: None   Collection Time: 03/20/22  8:44 PM  Result Value Ref Range Status   Enterococcus faecalis NOT DETECTED NOT DETECTED Final   Enterococcus Faecium NOT DETECTED NOT DETECTED Final   Listeria monocytogenes NOT DETECTED NOT DETECTED Final   Staphylococcus species NOT DETECTED NOT DETECTED Final   Staphylococcus aureus (BCID) NOT DETECTED NOT DETECTED Final   Staphylococcus epidermidis NOT DETECTED NOT DETECTED Final   Staphylococcus lugdunensis NOT DETECTED NOT DETECTED Final   Streptococcus species NOT DETECTED NOT DETECTED Final   Streptococcus agalactiae NOT DETECTED NOT DETECTED Final   Streptococcus pneumoniae NOT DETECTED NOT DETECTED Final   Streptococcus pyogenes NOT DETECTED NOT DETECTED Final   A.calcoaceticus-baumannii NOT DETECTED NOT DETECTED Final   Bacteroides fragilis NOT DETECTED NOT DETECTED Final   Enterobacterales NOT DETECTED NOT DETECTED Final   Enterobacter cloacae complex NOT DETECTED NOT DETECTED Final   Escherichia coli NOT DETECTED NOT DETECTED Final   Klebsiella aerogenes NOT DETECTED NOT DETECTED Final   Klebsiella oxytoca NOT DETECTED NOT DETECTED Final   Klebsiella pneumoniae  NOT DETECTED NOT DETECTED Final   Proteus species NOT DETECTED NOT DETECTED Final   Salmonella species NOT DETECTED NOT DETECTED Final   Serratia marcescens NOT DETECTED NOT DETECTED Final   Haemophilus influenzae NOT DETECTED NOT DETECTED Final   Neisseria meningitidis NOT DETECTED NOT DETECTED Final   Pseudomonas aeruginosa NOT DETECTED NOT DETECTED Final   Stenotrophomonas maltophilia NOT DETECTED NOT DETECTED Final   Candida albicans NOT DETECTED NOT DETECTED Final   Candida auris NOT DETECTED NOT DETECTED Final   Candida glabrata NOT DETECTED NOT DETECTED Final   Candida krusei NOT DETECTED NOT DETECTED Final   Candida parapsilosis NOT DETECTED NOT DETECTED Final   Candida tropicalis NOT DETECTED NOT DETECTED Final   Cryptococcus neoformans/gattii NOT DETECTED NOT DETECTED Final    Comment: Performed at [facility], [address], [place], [place] [address]    Radiology Studies: ECHOCARDIOGRAM  COMPLETE  Result Date: 03/23/2022    ECHOCARDIOGRAM REPORT   Patient Name:   Mitchell Dixon Date of Exam: 03/23/2022 Medical Rec #:  [id number]               Height:       70.0 in Accession #:    [id number]              Weight:       200.0 lb Date of Birth:  09/27/1955               BSA:          2.087 m Patient Age:    66 years                BP:           108/73 mmHg Patient Gender: M                       HR:           88 bpm. Exam Location:  [facility] Procedure: 2D Echo, Cardiac Doppler and Color Doppler Indications:     Atrial Fibrillation I48.91  History:         Patient has prior history of Echocardiogram examinations, most                  recent 02/25/2017. Pacemaker, Stroke; Risk                  Factors:Hypertension.  Sonographer:     Cristela Blue Referring Phys:  [id number]AL[address] BERGE Diagnosing Phys: Lorine Bears MD  Sonographer Comments: Technically challenging study due to limited acoustic windows, no apical window and no subcostal window. Image acquisition  challenging due to patient body habitus. IMPRESSIONS  1. Left ventricular ejection fraction, by estimation, is 50 to 55%. The left ventricle has low normal function. Left ventricular endocardial border not optimally defined to evaluate regional wall motion. There is mild left ventricular hypertrophy. Left ventricular diastolic function could not be evaluated.  2. Right ventricular systolic function is normal. The right ventricular size is normal. Tricuspid regurgitation signal is inadequate for assessing PA pressure.  3. The mitral valve is normal in structure. No evidence of mitral valve regurgitation. No evidence of mitral stenosis.  4. The aortic valve is normal in structure. Aortic valve regurgitation is not visualized. No aortic stenosis is present.  5. Technically challenging study due to limited acoustic windows, no apical window and no subcostal window. FINDINGS  Left Ventricle: Left ventricular ejection fraction, by estimation, is 50 to 55%. The left ventricle has low normal function. Left ventricular endocardial border not optimally defined to evaluate regional wall motion. The left ventricular internal cavity  size was normal in size. There is mild left ventricular hypertrophy. Left ventricular diastolic function could not be evaluated. Right Ventricle: The right ventricular size is normal. No increase in right ventricular wall thickness. Right ventricular systolic function is normal. Tricuspid regurgitation signal is inadequate for assessing PA pressure. Left Atrium: Left atrial size was normal in size. Right Atrium: Right atrial size was normal in size. Pericardium: There is no evidence of pericardial effusion. Mitral Valve: The mitral valve is normal in structure. No evidence of mitral valve regurgitation. No evidence of mitral valve stenosis. Tricuspid Valve: The tricuspid valve is normal in structure. Tricuspid valve regurgitation is not demonstrated. No evidence of tricuspid stenosis. Aortic Valve:  The aortic valve is normal in structure. Aortic  valve regurgitation is not visualized. No aortic stenosis is present. Pulmonic Valve: The pulmonic valve was normal in structure. Pulmonic valve regurgitation is not visualized. No evidence of pulmonic stenosis. Aorta: The aortic root is normal in size and structure. Venous: The inferior vena cava was not well visualized. IAS/Shunts: No atrial level shunt detected by color flow Doppler.  LEFT VENTRICLE PLAX 2D LVIDd:         4.00 cm LVIDs:         2.90 cm LV PW:         1.00 cm LV IVS:        1.20 cm LVOT diam:     2.00 cm LVOT Area:     3.14 cm  LEFT ATRIUM         Index LA diam:    2.90 cm 1.39 cm/m   AORTA Ao Root diam: 3.20 cm  SHUNTS Systemic Diam: 2.00 cm Kathlyn Sacramento MD Electronically signed by Kathlyn Sacramento MD Signature Date/Time: 03/23/2022/1:04:08 PM    Final     Scheduled Meds:  apixaban  5 mg Oral BID   atorvastatin  40 mg Oral QPM   azithromycin  500 mg Oral QHS   guaiFENesin  600 mg Oral BID   metoprolol tartrate  50 mg Oral BID   potassium chloride  40 mEq Oral BID   Continuous Infusions:  cefTRIAXone (ROCEPHIN)  IV Stopped (03/22/22 2318)   metronidazole Stopped (03/23/22 1518)     LOS: 3 days    Time spent: 35 mins   Ladene Allocca, MD [facility]   If 7PM-7AM, please contact night-coverage

## 2022-03-23 NOTE — Progress Notes (Signed)
Cardiology Progress Note   Patient Name: Mitchell Dixon Date of Encounter: 03/23/2022  Primary Cardiologist: Lorine Bears, MD  Subjective   Feels well this morning.  No recurrent atrial fibrillation.  Denies chest pain or dyspnea.  Inpatient Medications    Scheduled Meds:  atorvastatin  40 mg Oral QPM   guaiFENesin  600 mg Oral BID   metoprolol tartrate  50 mg Oral BID   Continuous Infusions:  azithromycin Stopped (03/23/22 0029)   cefTRIAXone (ROCEPHIN)  IV Stopped (03/22/22 2318)   metronidazole     PRN Meds: acetaminophen **OR** acetaminophen, albuterol, HYDROcodone-acetaminophen, ondansetron **OR** ondansetron (ZOFRAN) IV   Vital Signs    Vitals:   03/23/22 0400 03/23/22 0800 03/23/22 1132 03/23/22 1200  BP: 112/74 108/73  126/88  Pulse: 84 88  98  Resp: 17 15  13   Temp: 98.1 F (36.7 C)  97.7 F (36.5 C)   TempSrc: Oral  Oral   SpO2: 96% 96%  98%  Weight:      Height:        Intake/Output Summary (Last 24 hours) at 03/23/2022 1254 Last data filed at 03/23/2022 1147 Gross per 24 hour  Intake 350 ml  Output 1900 ml  Net -1550 ml   Filed Weights   03/20/22 1808  Weight: 90.7 kg    Physical Exam   GEN: Well nourished, well developed, in no acute distress.  HEENT: Grossly normal.  Neck: Supple, no JVD, carotid bruits, or masses. Cardiac: RRR, no murmurs, rubs, or gallops. No clubbing, cyanosis, 1+ bilateral lower extremity edema.  Radials 2+, DP/PT 2+ and equal bilaterally.  Respiratory:  Respirations regular and unlabored, diminished breath sounds bilaterally. GI: Soft, nontender, nondistended, BS + x 4. MS: Paraplegic-does not move lower extremities.  Normal strength in upper extremities.  Contracted lower legs. Skin: warm and dry, no rash. Neuro: Moves upper extremities normally. Psych: AAOx3.  Normal affect.  Labs    Chemistry Recent Labs  Lab 03/20/22 1859 03/21/22 0433 03/22/22 0454 03/23/22 0509  NA 133* 135 134* 133*  K  3.0* 2.9* 3.0* 3.1*  CL 100 106 105 104  CO2 20* 20* 19* 22  GLUCOSE 98 82 73 87  BUN 27* 24* 17 14  CREATININE 1.50* 1.20 1.07 1.12  CALCIUM 8.4* 7.6* 7.7* 7.5*  PROT 7.6  --   --   --   ALBUMIN 3.0*  --   --   --   AST 33  --   --   --   ALT 12  --   --   --   ALKPHOS 129*  --   --   --   BILITOT 2.3*  --   --   --   GFRNONAA 52* >60 >60 >60  ANIONGAP 13 9 10 7      Hematology Recent Labs  Lab 03/20/22 1859 03/21/22 0433 03/22/22 0454  WBC 26.7* 20.5* 20.5*  RBC 4.23 3.45* 3.49*  HGB 12.4* 10.1* 10.0*  HCT 40.0 31.7* 32.1*  MCV 94.6 91.9 92.0  MCH 29.3 29.3 28.7  MCHC 31.0 31.9 31.2  RDW 14.7 14.6 14.9  PLT 186 157 184    Cardiac Enzymes  Recent Labs  Lab 03/20/22 1859 03/20/22 2059  TROPONINIHS 13 22*     Lipids  Lab Results  Component Value Date   CHOL 114 07/20/2016   HDL 28 (L) 07/20/2016   LDLCALC 67 07/20/2016   TRIG 95 07/20/2016   CHOLHDL 4.1 07/20/2016    HbA1c  Lab Results  Component Value Date   HGBA1C 5.7 (H) 07/20/2016    Radiology    CT CHEST ABDOMEN PELVIS W CONTRAST  Result Date: 03/20/2022 CLINICAL DATA:  Sepsis and cough. Reported history of type A aortic dissection on CT dated 02/25/2017. EXAM: CT CHEST, ABDOMEN, AND PELVIS WITH CONTRAST TECHNIQUE: Multidetector CT imaging of the chest, abdomen and pelvis was performed following the standard protocol during bolus administration of intravenous contrast. RADIATION DOSE REDUCTION: This exam was performed according to the departmental dose-optimization program which includes automated exposure control, adjustment of the mA and/or kV according to patient size and/or use of iterative reconstruction technique. CONTRAST:  147mL OMNIPAQUE IOHEXOL 300 MG/ML  SOLN COMPARISON:  CT dated 02/25/2017. FINDINGS: CT CHEST FINDINGS Cardiovascular: There is no cardiomegaly or pericardial effusion. Implantable pacemaker device. Status post repair of ascending aorta. A dissection flap originates in the  aortic arch just distal to the takeoff of the left subclavian artery. 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 Tiny focus of density adjacent to the pseudoaneurysm may represent postsurgical changes. A small amount of fluid is not excluded. The origins of the great vessels of the aortic arch remain patent. The dissection flap extends into the abdominal aorta. There has been interval re-expansion of the true lumen since the prior CT. There is increase in the diameter of the aortic arch measuring approximately 4.5 cm (previously 3.7 cm. No periaortic fluid collection. The central pulmonary arteries appear patent. Mediastinum/Nodes: No hilar or mediastinal adenopathy. The esophagus is grossly unremarkable. No mediastinal fluid collection. Lungs/Pleura: Background of emphysema. Faint interstitial coarsening in the right middle lobe and right upper lobe, likely chronic. Atypical infiltrate is less likely. There are bibasilar subpleural atelectasis/scarring. There is trace left pleural effusion. No focal consolidation or pneumothorax. The central airways are patent. Musculoskeletal: Median sternotomy wires. No acute osseous pathology. CT ABDOMEN PELVIS FINDINGS No intra-abdominal free air or free fluid. Hepatobiliary: The liver is unremarkable. No biliary dilatation. The gallbladder is unremarkable. Pancreas: Unremarkable. No pancreatic ductal dilatation or surrounding inflammatory changes. Spleen: Normal in size without focal abnormality. Adrenals/Urinary Tract: The adrenal glands unremarkable. Atrophic right kidney. A cluster of stones in the upper pole of the left kidney measure up to 13 mm. No hydronephrosis. Areas of cortical scarring involving the upper and middle pole of the left kidney. 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 The  urinary bladder is collapsed. Multiple large stones noted within the urinary bladder. There is diffuse thickened appearance of the bladder wall which may be related to chronic bladder infection. Correlation with urinalysis recommended to exclude active cystitis. Stomach/Bowel: There is no bowel obstruction or active inflammation. The appendix is normal. Vascular/Lymphatic: Aortic dissection with flap terminating at the aortic bifurcation. The celiac axis arises from the false lumen and appears patent. The SMA appears patent. The dissection flap extends into the origin of the SMA. The left renal artery is patent and arises from the false lumen. The right renal artery appears patent however slightly hypoperfused. The IMA is patent. Common iliac artery stents noted. The stents appear patent. The external iliac arteries are patent. The IVC is unremarkable. No portal venous gas. There is no adenopathy. Reproductive: The prostate and seminal vesicles are grossly unremarkable. Other: None Musculoskeletal: Osteopenia with degenerative changes of the spine. Chronic changes of the left hip. No  acute osseous pathology. IMPRESSION: 1. Status post repair of the ascending aorta. A dissection flap originates in the aortic arch just distal to the takeoff of the left subclavian artery and extends to the abdominal aortic bifurcation. There has been interval re-expansion of the true lumen since the prior CT. There is increase in the diameter of the aortic arch measuring approximately 4.5 cm (previously 3.7 cm). No periaortic fluid collection. 2. 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 Vascular surgery consult is advised. 3. Emphysema.  Trace left pleural effusion. 4. No bowel obstruction. Normal appendix. 5. Multiple large stones within the urinary bladder. Diffuse thickened appearance of the bladder wall may be related to chronic bladder infection. Correlation with urinalysis recommended to  exclude active cystitis. Electronically Signed   By: Elgie Collard M.D.   On: 03/20/2022 21:43   DG Chest Port 1 View  Result Date: 03/20/2022 CLINICAL DATA:  Cough. EXAM: PORTABLE CHEST 1 VIEW COMPARISON:  Chest radiograph dated 02/25/2017 FINDINGS: There is mild diffuse interstitial and peribronchial densities primarily involving the right lung with areas of nodularity and new compared to prior radiograph. Findings may represent chronic bronchitic changes but developing atypical infiltrate is not excluded. Clinical correlation is recommended. No focal consolidation, pleural effusion, or pneumothorax. The cardiac silhouette is within normal limits. There is aneurysmal dilatation of the aortic arch measuring up to 6.1 cm in diameter. There is report of type A dissection on CT dated 02/25/2017. The images of the head CT are not available for comparison. A thin linear lucency to the left of the aortic arch and proximal descending aorta may be artifactual and air within a bronchus. A small pneumomediastinum is not excluded. CT may provide better evaluation. Median sternotomy wires and an implantable device. No acute osseous pathology. IMPRESSION: 1. Possible developing atypical infiltrate in the right lung. 2. 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 Reported type A dissection on the CT dated 02/25/2017. 3. Possible small pneumomediastinum. CT may provide better evaluation. Electronically Signed   By: Elgie Collard M.D.   On: 03/20/2022 19:17    Telemetry    Regular sinus rhythm- Personally Reviewed  Cardiac Studies   2D Echocardiogram 6.2020 [facility])   Replacement of ascending aorta (02/28/2018)   Left ventricular hypertrophy - mild   Mildly decreased left ventricular systolic function, ejection fraction  45%   Segmental wall motion abnormality - apical thinning and akinesis   Dilated ascending aorta   Normal right ventricular systolic function _____________   2D  Echocardiogram 1.23.2024  Pending _____________   Patient Profile     67 y.o. 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  Assessment & Plan    1.  Paroxysmal atrial fibrillation: Patient with a history of paroxysmal atrial fibrillation on chronic Eliquis at home.  He uses metoprolol 50 mg twice daily.  Hospitalized in January 20 in the setting of a 1 week history of malaise, cough, nausea, and has been treated for sepsis, UTI, and pneumonia.  Developed rapid atrial fibrillation at 11:01 AM on January 22 and subsequently converted back to sinus rhythm at 13:42.  CHA2DS2-VASc is 5.  Eliquis has been on hold in the setting of hematuria with plan for urologic instrumentation.  If he has recurrent atrial fibrillation, would plan to add amiodarone.  Defer resumption of anticoagulation to primary team/urology.  Will provide additional potassium supplementation given ongoing hypokalemia.  Maintain potassium at 4.0 magnesium at 2.0.  2.  Sepsis/UTI/pneumonia: Antibiotics per primary team.  3.  Bladder calculi/hematuria: Evaluated by urology with plan for staged cystolitholopaxy.  Anticoagulation on hold.  4.  Essential hypertension/presumed orthostatic hypotension: Pressure stable throughout admission.  Patient reported that over the past few years, he has become increasingly better and as result of presyncope and syncope that might occur simply while sitting up.  Would likely benefit from lower extremity compression abdominal binder in the outpatient setting.  5.   Hyperlipidemia: Continue statin therapy.  6.  History of type A aortic dissection: Status post repair at [facility] with CTA this admission.  Evaluated with vascular surgery and findings felt to be stable with recommendation for outpatient vascular surgery follow-up.  7.  Paraplegia: Following spinal cord ischemia and injury in the setting of aortic dissection in late 2018.  8.  Hypokalemia: Potassium low again at 3.1 despite ongoing supplementation.  He also received magnesium on January 22.  Magnesium 2.1 this morning.  Received 40 mEq of potassium this morning.  Will change to 40 mEq twice daily for now.  9.  Chronic heart failure midrange ejection fraction: EF 45% with apical thinning in June 2020.  Echo pending.  10.  Normocytic anemia: H&H stable.  11.  Permanent pacemaker in situ: Status post [employer] Micra at [facility] in 2019 following aortic dissection surgery.  Device interrogated on January 21 showing 99.9% ventricular sensing and less than 0.1% ventricular pacing.  Remaining battery greater than 8 years.  We will arrange for EP follow-up in our clinic locally.  Signed, Murray Hodgkins, NP  03/23/2022, 12:54 PM    For questions or updates, please contact   Please consult [email or web address] for contact info under Cardiology/STEMI.

## 2022-03-23 NOTE — Progress Notes (Signed)
*  PRELIMINARY RESULTS* Echocardiogram 2D Echocardiogram has been performed.  Mitchell Dixon 03/23/2022, 9:53 AM

## 2022-03-23 NOTE — ED Notes (Signed)
Patient provided with ice water per request. Patient resting in bed free from sign of distress. Breathing unlabored speaking in full sentences with symmetric chest rise and fall. Bed low and locked with side rails raised x2. Call bell in reach and monitor in place.   

## 2022-03-24 DIAGNOSIS — I4891 Unspecified atrial fibrillation: Secondary | ICD-10-CM

## 2022-03-24 DIAGNOSIS — A419 Sepsis, unspecified organism: Secondary | ICD-10-CM | POA: Diagnosis not present

## 2022-03-24 DIAGNOSIS — Z950 Presence of cardiac pacemaker: Secondary | ICD-10-CM

## 2022-03-24 DIAGNOSIS — R6520 Severe sepsis without septic shock: Secondary | ICD-10-CM | POA: Diagnosis not present

## 2022-03-24 DIAGNOSIS — I10 Essential (primary) hypertension: Secondary | ICD-10-CM | POA: Diagnosis not present

## 2022-03-24 LAB — BASIC METABOLIC PANEL
Anion gap: 3 — ABNORMAL LOW (ref 5–15)
BUN: 9 mg/dL (ref 8–23)
CO2: 21 mmol/L — ABNORMAL LOW (ref 22–32)
Calcium: 7.4 mg/dL — ABNORMAL LOW (ref 8.9–10.3)
Chloride: 109 mmol/L (ref 98–111)
Creatinine, Ser: 1.01 mg/dL (ref 0.61–1.24)
GFR, Estimated: >60 mL/min (ref >60)
Glucose, Bld: 93 mg/dL (ref 70–99)
Potassium: 3.6 mmol/L (ref 3.5–5.1)
Sodium: 133 mmol/L — ABNORMAL LOW (ref 135–145)

## 2022-03-24 LAB — CBC
HCT: 31.6 % — ABNORMAL LOW (ref 39.0–52.0)
Hemoglobin: 9.9 g/dL — ABNORMAL LOW (ref 13.0–17.0)
MCH: 28.9 pg (ref 26.0–34.0)
MCHC: 31.3 g/dL (ref 30.0–36.0)
MCV: 92.1 fL (ref 80.0–100.0)
Platelets: 226 10*3/uL (ref 150–400)
RBC: 3.43 MIL/uL — ABNORMAL LOW (ref 4.22–5.81)
RDW: 14.7 % (ref 11.5–15.5)
WBC: 12 10*3/uL — ABNORMAL HIGH (ref 4.0–10.5)
nRBC: 0 % (ref 0.0–0.2)

## 2022-03-24 LAB — CULTURE, BLOOD (ROUTINE X 2): Special Requests: ADEQUATE

## 2022-03-24 LAB — MAGNESIUM: Magnesium: 2.1 mg/dL (ref 1.7–2.4)

## 2022-03-24 LAB — PHOSPHORUS: Phosphorus: 2 mg/dL — ABNORMAL LOW (ref 2.5–4.6)

## 2022-03-24 MED ORDER — CHLORHEXIDINE GLUCONATE CLOTH 2 % EX PADS
6.0000 | MEDICATED_PAD | Freq: Every day | CUTANEOUS | Status: DC
  Administered 2022-03-24 – 2022-03-26 (×3): 6 via TOPICAL

## 2022-03-24 MED ORDER — K PHOS MONO-SOD PHOS DI & MONO 155-852-130 MG PO TABS
250.0000 mg | ORAL_TABLET | Freq: Three times a day (TID) | ORAL | Status: AC
  Administered 2022-03-24 – 2022-03-25 (×6): 250 mg via ORAL
  Filled 2022-03-24 (×6): qty 1

## 2022-03-24 NOTE — Progress Notes (Signed)
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  Patient has neurogenic bladder and self catheterizes every 4 hours.  ED workup shows tachypneic, tachycardic, leukocytosis with a lactic acid of 2.3.  RSV, Influenza, flu negative.  UA shows many bacteria consistent with UTI.  CT abdomen and pelvis showed possible pneumonia as well as increased aneurysmal dilatation compared to 2018 as well as pseudoaneurysm.  ED physician has spoken with vascular surgeon Dr. Imogene Burn who states findings on aorta are likely chronic especially as patient has no chest pain. He recommended treatment for UTI and pneumonia.  Patient is admitted for further management.  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 Continue empiric antibiotics (ceftriaxone and Zithromax).   Continue IV fluid resuscitation. Lactic acid  improving with IV fluid resuscitation. Blood culture shows gram variable rods, sensitivity pending. Flagyl added to cover Clostridium   UTI with hematuria: Multiple bladder calculi with hematuria: Continue empiric Rocephin. Continue IV fluid resuscitation. Urology is consulted, suggested cystolitholapaxy once acute episode is resolved.   Due to multiplicity of stones this will most likely require a staged procedure. SCD for DVT prophylaxis.   Community-acquired pneumonia: Continue empiric antibiotic Rocephin and Zithromax. Continue Antitussives, albuterol as needed, incentive spirometer Continue supplemental oxygen as needed.  Paroxysmal A-fib: Patient with history of paroxysmal A-fib on chronic Eliquis at home, He is also on metoprolol for heart rate control. He went into atrial fibrillation in the setting of sepsis. He is already converted back to the normal sinus rhythm. Eliquis is on hold in the setting of hematuria and for urological procedure. Continue beta-blocker,  may need to consider antiarrhythmic amiodarone for any recurrence.  History of dissecting aneurysm repair in 01/2017: CT chest showing enlargement of aortic arch to 4.5 compared to 3.7 in 2018 with no periaortic fluid collection. 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 Imogene Burn was consulted from ED and said stable findings. Can consider inpatient vascular consult to follow versus outpatient referral.   Acute kidney injury: > Resolved. Creatinine 1.5, presumed AKI Renal functions improved and back to baseline with fluid resuscitation.   Hypokalemia/ hypophosphatemia: Replaced. Continue to monitor  Chronic complete paraplegia: Continue Supportive care.    Peripheral arterial disease: Does not appear to be on any medication at this time.   History of cardiac pacemaker: No acute issues suspected however last interrogated was on 10/2020 at [facility]  EP Pacemaker interrogated.   Essential hypertension: Normotensive.  Hold off blood pressure medication in the setting of sepsis.   DVT prophylaxis: SCDs Code Status: Full code Family Communication: No family at bed side. Disposition Plan:  Status is: Inpatient Remains inpatient appropriate because: Admitted for sepsis sec.to UTI, PNA, requiring IV antibiotics. Urology is consulted.  Recommended procedures once acute infection resolves.   Consultants:  Urology  Procedures:None  Antimicrobials: Ceftriaxone and Zithromax  Subjective: Patient was seen and examined at bedside.  Overnight events noted. Patient still reports feeling weak and tired.  Heart rate is now controlled and converted to normal sinus rhythm. He denies any chest pain or shortness of breath.  Objective: Vitals:   03/24/22 0500 03/24/22 0538 03/24/22 0830 03/24/22 1211  BP:  117/78 (!) 133/96 (!) 139/93  Pulse:  83 94 (!) 102  Resp:  16 20 20   Temp:  97.8 F (36.6 C) 98 F (36.7 C) 98.2 F (36.8 C)  TempSrc:  Oral Oral Oral  SpO2:  96% 93% 98%  Weight:      Height: 5\' 10"  (1.778 m)       Intake/Output Summary (Last 24 hours) at 03/24/2022 1225 Last data filed at 03/24/2022 0900 Gross per 24 hour  Intake 540 ml  Output 1450 ml  Net -910 ml   Filed Weights   03/20/22 1808  Weight: 90.7 kg    Examination:  General exam: Appears comfortable, not in any acute distress.  Deconditioned. Respiratory system: CTA bilaterally, respiratory effort normal, RR 13. Cardiovascular system: S1-S2 heard, regular rate and rhythm, no murmur. Gastrointestinal system: Abdomen is soft, non tender, nondistended, BS+ Central nervous system: Alert and oriented x 3, moves upper extremities. Extremities: No edema, no cyanosis, no clubbing. Skin: No rashes, lesions or ulcers Psychiatry: Judgement and insight appear normal. Mood & affect appropriate.     Data Reviewed: I have personally reviewed following labs and  imaging studies  CBC: Recent Labs  Lab 03/20/22 1859 03/21/22 0433 03/22/22 0454 03/24/22 0448  WBC 26.7* 20.5* 20.5* 12.0*  NEUTROABS 24.8*  --   --   --   HGB 12.4* 10.1* 10.0* 9.9*  HCT 40.0 31.7* 32.1* 31.6*  MCV 94.6 91.9 92.0 92.1  PLT 186 157 184 564   Basic Metabolic Panel: Recent Labs  Lab 03/20/22 1859 03/21/22 0433 03/22/22 0454 03/23/22 0509 03/24/22 0448  NA 133* 135 134* 133* 133*  K 3.0* 2.9* 3.0* 3.1* 3.6  CL 100 106 105 104 109  CO2 20* 20* 19* 22 21*  GLUCOSE 98 82 73 87 93  BUN 27* 24* 17 14 9   CREATININE 1.50* 1.20 1.07 1.12 1.01  CALCIUM 8.4* 7.6* 7.7* 7.5* 7.4*  MG  --   --  1.7 2.1 2.1  PHOS  --   --  1.2*  --  2.0*   GFR: Estimated Creatinine Clearance: 83.7 mL/min (by C-G formula based on SCr of 1.01 mg/dL). Liver Function Tests: Recent Labs  Lab 03/20/22 1859  AST 33  ALT 12  ALKPHOS 129*  BILITOT 2.3*  PROT 7.6  ALBUMIN 3.0*   Recent Labs  Lab 03/20/22 1859  LIPASE 31   No results for input(s): "AMMONIA" in the last 168 hours. Coagulation Profile: Recent Labs  Lab 03/21/22 0433  INR 1.7*   Cardiac Enzymes: No results for input(s): "CKTOTAL", "CKMB", "CKMBINDEX", "TROPONINI" in the last 168 hours. BNP (last 3 results) No results for input(s): "PROBNP" in the last 8760 hours. HbA1C: No results for  input(s): "HGBA1C" in the last 72 hours. CBG: No results for input(s): "GLUCAP" in the last 168 hours. Lipid Profile: No results for input(s): "CHOL", "HDL", "LDLCALC", "TRIG", "CHOLHDL", "LDLDIRECT" in the last 72 hours. Thyroid Function Tests: No results for input(s): "TSH", "T4TOTAL", "FREET4", "T3FREE", "THYROIDAB" in the last 72 hours. Anemia Panel: No results for input(s): "VITAMINB12", "FOLATE", "FERRITIN", "TIBC", "IRON", "RETICCTPCT" in the last 72 hours. Sepsis Labs: Recent Labs  Lab 03/20/22 1859 03/20/22 2059 03/21/22 0433 03/22/22 2224  PROCALCITON  --   --  22.70  --   LATICACIDVEN 3.7* 2.3*  --  0.9     Recent Results (from the past 240 hour(s))  Resp panel by RT-PCR (RSV, Flu A&B, Covid) Anterior Nasal Swab     Status: None   Collection Time: 03/20/22  7:00 PM   Specimen: Anterior Nasal Swab  Result Value Ref Range Status   SARS Coronavirus 2 by RT PCR NEGATIVE NEGATIVE Final    Comment: (NOTE) SARS-CoV-2 target nucleic acids are NOT DETECTED.  The SARS-CoV-2 RNA is generally detectable in upper respiratory specimens during the acute phase of infection. The lowest concentration of SARS-CoV-2 viral copies this assay can detect is 138 copies/mL. A negative result does not preclude SARS-Cov-2 infection and should not be used as the sole basis for treatment or other patient management decisions. A negative result may occur with  improper specimen collection/handling, submission of specimen other than nasopharyngeal swab, presence of viral mutation(s) within the areas targeted by this assay, and inadequate number of viral copies(<138 copies/mL). A negative result must be combined with clinical observations, patient history, and epidemiological information. The expected result is Negative.  Fact Sheet for Patients:  BloggerCourse.com  Fact Sheet for Healthcare Providers:  SeriousBroker.it  This test is no t yet approved or cleared by the [place] FDA and  has been authorized for detection and/or diagnosis of SARS-CoV-2 by FDA under an Emergency Use Authorization (EUA). This EUA will remain  in effect (meaning this test can be used) for the duration of the COVID-19 declaration under Section 564(b)(1) of the Act, 21 U.S.C.section 360bbb-3(b)(1), unless the authorization is terminated  or revoked sooner.       Influenza A by PCR NEGATIVE NEGATIVE Final   Influenza B by PCR NEGATIVE NEGATIVE Final    Comment: (NOTE) The Xpert Xpress SARS-CoV-2/FLU/RSV plus assay is intended as an aid in the diagnosis of influenza from  Nasopharyngeal swab specimens and should not be used as a sole basis for treatment. Nasal washings and aspirates are unacceptable for Xpert Xpress SARS-CoV-2/FLU/RSV testing.  Fact Sheet for Patients: BloggerCourse.com  Fact Sheet for Healthcare Providers: SeriousBroker.it  This test is not yet approved or cleared by the [place] FDA and has been authorized for detection and/or diagnosis of SARS-CoV-2 by FDA under an Emergency Use Authorization (EUA). This EUA will remain in effect (meaning this test can be used) for the duration of the COVID-19 declaration under Section 564(b)(1) of the Act, 21 U.S.C. section 360bbb-3(b)(1), unless the authorization is terminated or revoked.     Resp Syncytial Virus by PCR NEGATIVE NEGATIVE Final    Comment: (NOTE) Fact Sheet for Patients: BloggerCourse.com  Fact Sheet for Healthcare Providers: SeriousBroker.it  This test is not yet approved or cleared by the [place] FDA and has been authorized for detection and/or diagnosis of SARS-CoV-2 by FDA under an Emergency Use Authorization (EUA). This EUA will remain in effect (meaning this test can be used) for the duration of the  COVID-19 declaration under Section 564(b)(1) of the Act, 21 U.S.C. section 360bbb-3(b)(1), unless the authorization is terminated or revoked.  Performed at [facility], [address], [place], [place] [address]   Urine Culture     Status: Abnormal   Collection Time: 03/20/22  8:39 PM   Specimen: In/Out Cath Urine  Result Value Ref Range Status   Specimen Description   Final    IN/OUT CATH URINE Performed at [facility], [address] [place], [place] [address]    Special Requests   Final    NONE Performed at [facility], [address] [place], [place] [address]    Culture MULTIPLE SPECIES PRESENT, SUGGEST RECOLLECTION  (A)  Final   Report Status 03/22/2022 FINAL  Final  Blood Culture (routine x 2)     Status: Abnormal   Collection Time: 03/20/22  8:44 PM   Specimen: BLOOD  Result Value Ref Range Status   Specimen Description   Final    BLOOD BLOOD RIGHT FOREARM Performed at [facility], [address] [place], [place] [address]    Special Requests   Final    BOTTLES DRAWN AEROBIC AND ANAEROBIC Blood Culture adequate volume Performed at [facility], [address] [place], [place] [address]    Culture  Setup Time (A)  Final    GRAM VARIABLE ROD ANAEROBIC BOTTLE ONLY CRITICAL RESULT CALLED TO, READ BACK BY AND VERIFIED WITH: JUSTIN MILLER 03/21/22 1636 AMK    Culture (A)  Final    CLOSTRIDIUM RAMOSUM PROVIDENCIA STUARTII Standardized susceptibility testing for this organism is not available. FOR CLOSTRIDIUM RAMOSUM Performed at [facility], [address], [place], [place] [address]    Report Status 03/24/2022 FINAL  Final   Organism ID, Bacteria PROVIDENCIA STUARTII  Final      Susceptibility   Providencia stuartii - MIC*    AMPICILLIN RESISTANT Resistant     CEFAZOLIN >=64 RESISTANT Resistant     CEFEPIME <=0.12 SENSITIVE Sensitive     CEFTAZIDIME <=1 SENSITIVE Sensitive     CEFTRIAXONE <=0.25 SENSITIVE Sensitive     CIPROFLOXACIN <=0.25 SENSITIVE Sensitive     GENTAMICIN RESISTANT Resistant     IMIPENEM 2 SENSITIVE Sensitive     TRIMETH/SULFA <=20 SENSITIVE Sensitive     AMPICILLIN/SULBACTAM 16 INTERMEDIATE Intermediate     PIP/TAZO <=4 SENSITIVE Sensitive     * PROVIDENCIA STUARTII  Blood Culture ID Panel (Reflexed)     Status: None   Collection Time: 03/20/22  8:44 PM  Result Value Ref Range Status   Enterococcus faecalis NOT DETECTED NOT DETECTED Final   Enterococcus Faecium NOT DETECTED NOT DETECTED Final   Listeria monocytogenes NOT DETECTED NOT DETECTED Final   Staphylococcus species NOT DETECTED NOT DETECTED Final   Staphylococcus aureus (BCID)  NOT DETECTED NOT DETECTED Final   Staphylococcus epidermidis NOT DETECTED NOT DETECTED Final   Staphylococcus lugdunensis NOT DETECTED NOT DETECTED Final   Streptococcus species NOT DETECTED NOT DETECTED Final   Streptococcus agalactiae NOT DETECTED NOT DETECTED Final   Streptococcus pneumoniae NOT DETECTED NOT DETECTED Final   Streptococcus pyogenes NOT DETECTED NOT DETECTED Final   A.calcoaceticus-baumannii NOT DETECTED NOT DETECTED Final   Bacteroides fragilis NOT DETECTED NOT DETECTED Final   Enterobacterales NOT DETECTED NOT DETECTED Final   Enterobacter cloacae complex NOT DETECTED NOT DETECTED Final   Escherichia coli NOT DETECTED NOT DETECTED Final   Klebsiella aerogenes NOT DETECTED NOT DETECTED Final   Klebsiella oxytoca NOT  DETECTED NOT DETECTED Final   Klebsiella pneumoniae NOT DETECTED NOT DETECTED Final   Proteus species NOT DETECTED NOT DETECTED Final   Salmonella species NOT DETECTED NOT DETECTED Final   Serratia marcescens NOT DETECTED NOT DETECTED Final   Haemophilus influenzae NOT DETECTED NOT DETECTED Final   Neisseria meningitidis NOT DETECTED NOT DETECTED Final   Pseudomonas aeruginosa NOT DETECTED NOT DETECTED Final   Stenotrophomonas maltophilia NOT DETECTED NOT DETECTED Final   Candida albicans NOT DETECTED NOT DETECTED Final   Candida auris NOT DETECTED NOT DETECTED Final   Candida glabrata NOT DETECTED NOT DETECTED Final   Candida krusei NOT DETECTED NOT DETECTED Final   Candida parapsilosis NOT DETECTED NOT DETECTED Final   Candida tropicalis NOT DETECTED NOT DETECTED Final   Cryptococcus neoformans/gattii NOT DETECTED NOT DETECTED Final    Comment: Performed at [facility], [address] [place], [place] [address]    Radiology Studies: ECHOCARDIOGRAM COMPLETE  Result Date: 03/23/2022    ECHOCARDIOGRAM REPORT   Patient Name:   Mitchell Dixon Date of Exam: 03/23/2022 Medical Rec #:  [id number]               Height:       70.0 in  Accession #:    [id number]              Weight:       200.0 lb Date of Birth:  1955/04/13               BSA:          2.087 m Patient Age:    66 years                BP:           108/73 mmHg Patient Gender: M                       HR:           88 bpm. Exam Location:  [facility] Procedure: 2D Echo, Cardiac Doppler and Color Doppler Indications:     Atrial Fibrillation I48.91  History:         Patient has prior history of Echocardiogram examinations, most                  recent 02/25/2017. Pacemaker, Stroke; Risk                  Factors:Hypertension.  Sonographer:     Cristela Blue Referring Phys:  [id number]AL[address] BERGE Diagnosing Phys: Lorine Bears MD  Sonographer Comments: Technically challenging study due to limited acoustic windows, no apical window and no subcostal window. Image acquisition challenging due to patient body habitus. IMPRESSIONS  1. Left ventricular ejection fraction, by estimation, is 50 to 55%. The left ventricle has low normal function. Left ventricular endocardial border not optimally defined to evaluate regional wall motion. There is mild left ventricular hypertrophy. Left ventricular diastolic function could not be evaluated.  2. Right ventricular systolic function is normal. The right ventricular size is normal. Tricuspid regurgitation signal is inadequate for assessing PA pressure.  3. The mitral valve is normal in structure. No evidence of mitral valve regurgitation. No evidence of mitral stenosis.  4. The aortic valve is normal in structure. Aortic valve regurgitation is not visualized. No aortic stenosis is present.  5. Technically challenging study due to limited acoustic windows, no apical window and no subcostal window. FINDINGS  Left Ventricle: Left  ventricular ejection fraction, by estimation, is 50 to 55%. The left ventricle has low normal function. Left ventricular endocardial border not optimally defined to evaluate regional wall motion. The left ventricular internal cavity   size was normal in size. There is mild left ventricular hypertrophy. Left ventricular diastolic function could not be evaluated. Right Ventricle: The right ventricular size is normal. No increase in right ventricular wall thickness. Right ventricular systolic function is normal. Tricuspid regurgitation signal is inadequate for assessing PA pressure. Left Atrium: Left atrial size was normal in size. Right Atrium: Right atrial size was normal in size. Pericardium: There is no evidence of pericardial effusion. Mitral Valve: The mitral valve is normal in structure. No evidence of mitral valve regurgitation. No evidence of mitral valve stenosis. Tricuspid Valve: The tricuspid valve is normal in structure. Tricuspid valve regurgitation is not demonstrated. No evidence of tricuspid stenosis. Aortic Valve: The aortic valve is normal in structure. Aortic valve regurgitation is not visualized. No aortic stenosis is present. Pulmonic Valve: The pulmonic valve was normal in structure. Pulmonic valve regurgitation is not visualized. No evidence of pulmonic stenosis. Aorta: The aortic root is normal in size and structure. Venous: The inferior vena cava was not well visualized. IAS/Shunts: No atrial level shunt detected by color flow Doppler.  LEFT VENTRICLE PLAX 2D LVIDd:         4.00 cm LVIDs:         2.90 cm LV PW:         1.00 cm LV IVS:        1.20 cm LVOT diam:     2.00 cm LVOT Area:     3.14 cm  LEFT ATRIUM         Index LA diam:    2.90 cm 1.39 cm/m   AORTA Ao Root diam: 3.20 cm  SHUNTS Systemic Diam: 2.00 cm Lorine Bears MD Electronically signed by Lorine Bears MD Signature Date/Time: 03/23/2022/1:04:08 PM    Final     Scheduled Meds:  apixaban  5 mg Oral BID   atorvastatin  40 mg Oral QPM   azithromycin  500 mg Oral QHS   Chlorhexidine Gluconate Cloth  6 each Topical Daily   guaiFENesin  600 mg Oral BID   metoprolol tartrate  50 mg Oral BID   phosphorus  250 mg Oral TID with meals   potassium chloride  40  mEq Oral BID   Continuous Infusions:  cefTRIAXone (ROCEPHIN)  IV Stopped (03/23/22 2048)   metronidazole 500 mg (03/24/22 1039)     LOS: 4 days    Time spent: 35 mins   Maleka Contino, MD [facility]   If 7PM-7AM, please contact night-coverage

## 2022-03-24 NOTE — Progress Notes (Signed)
Rounding Note    Patient Name: Mitchell Dixon Date of Encounter: 03/24/2022  [place] Cardiologist: Kathlyn Sacramento, MD   Subjective   No acute events overnight, denies chest pain or shortness of breath.  Denies palpitations.  Inpatient Medications    Scheduled Meds:  apixaban  5 mg Oral BID   atorvastatin  40 mg Oral QPM   azithromycin  500 mg Oral QHS   Chlorhexidine Gluconate Cloth  6 each Topical Daily   guaiFENesin  600 mg Oral BID   metoprolol tartrate  50 mg Oral BID   phosphorus  250 mg Oral TID with meals   potassium chloride  40 mEq Oral BID   Continuous Infusions:  cefTRIAXone (ROCEPHIN)  IV Stopped (03/23/22 2048)   metronidazole 500 mg (03/24/22 1039)   PRN Meds: acetaminophen **OR** acetaminophen, albuterol, HYDROcodone-acetaminophen, ondansetron **OR** ondansetron (ZOFRAN) IV   Vital Signs    Vitals:   03/24/22 0500 03/24/22 0538 03/24/22 0830 03/24/22 1211  BP:  117/78 (!) 133/96 (!) 139/93  Pulse:  83 94 (!) 102  Resp:  16 20 20   Temp:  97.8 F (36.6 C) 98 F (36.7 C) 98.2 F (36.8 C)  TempSrc:  Oral Oral Oral  SpO2:  96% 93% 98%  Weight:      Height: 5\' 10"  (1.778 m)       Intake/Output Summary (Last 24 hours) at 03/24/2022 1228 Last data filed at 03/24/2022 0900 Gross per 24 hour  Intake 540 ml  Output 1450 ml  Net -910 ml      03/20/2022    6:08 PM 02/25/2017    6:15 PM 02/25/2017    9:20 AM  Last 3 Weights  Weight (lbs) 200 lb 155 lb 3.3 oz 154 lb  Weight (kg) 90.719 kg 70.4 kg 69.854 kg      Telemetry    Sinus rhythm- Personally Reviewed  ECG     - Personally Reviewed  Physical Exam   GEN: No acute distress.   Neck: No JVD Cardiac: RRR Respiratory: Clear to auscultation bilaterally. GI: Soft, nontender, non-distended  MS: No edema; not able to move lower extremities Neuro:  Nonfocal  Psych: Normal affect   Labs    High Sensitivity Troponin:   Recent Labs  Lab 03/20/22 1859  03/20/22 2059  TROPONINIHS 13 22*     Chemistry Recent Labs  Lab 03/20/22 1859 03/21/22 0433 03/22/22 0454 03/23/22 0509 03/24/22 0448  NA 133*   < > 134* 133* 133*  K 3.0*   < > 3.0* 3.1* 3.6  CL 100   < > 105 104 109  CO2 20*   < > 19* 22 21*  GLUCOSE 98   < > 73 87 93  BUN 27*   < > 17 14 9   CREATININE 1.50*   < > 1.07 1.12 1.01  CALCIUM 8.4*   < > 7.7* 7.5* 7.4*  MG  --   --  1.7 2.1 2.1  PROT 7.6  --   --   --   --   ALBUMIN 3.0*  --   --   --   --   AST 33  --   --   --   --   ALT 12  --   --   --   --   ALKPHOS 129*  --   --   --   --   BILITOT 2.3*  --   --   --   --  GFRNONAA 52*   < > >60 >60 >60  ANIONGAP 13   < > 10 7 3*   < > = values in this interval not displayed.    Lipids No results for input(s): "CHOL", "TRIG", "HDL", "LABVLDL", "LDLCALC", "CHOLHDL" in the last 168 hours.  Hematology Recent Labs  Lab 03/21/22 0433 03/22/22 0454 03/24/22 0448  WBC 20.5* 20.5* 12.0*  RBC 3.45* 3.49* 3.43*  HGB 10.1* 10.0* 9.9*  HCT 31.7* 32.1* 31.6*  MCV 91.9 92.0 92.1  MCH 29.3 28.7 28.9  MCHC 31.9 31.2 31.3  RDW 14.6 14.9 14.7  PLT 157 184 226   Thyroid No results for input(s): "TSH", "FREET4" in the last 168 hours.  BNPNo results for input(s): "BNP", "PROBNP" in the last 168 hours.  DDimer No results for input(s): "DDIMER" in the last 168 hours.   Radiology    ECHOCARDIOGRAM COMPLETE  Result Date: 03/23/2022    ECHOCARDIOGRAM REPORT   Patient Name:   Mitchell Dixon Date of Exam: 03/23/2022 Medical Rec #:  [id number]               Height:       70.0 in Accession #:    [id number]              Weight:       200.0 lb Date of Birth:  1955/06/13               BSA:          2.087 m Patient Age:    67 years                BP:           108/73 mmHg Patient Gender: M                       HR:           88 bpm. Exam Location:  [facility] Procedure: 2D Echo, Cardiac Doppler and Color Doppler Indications:     Atrial Fibrillation I48.91  History:         Patient has  prior history of Echocardiogram examinations, most                  recent 02/25/2017. Pacemaker, Stroke; Risk                  Factors:Hypertension.  Sonographer:     Cristela Blue Referring Phys:  [id number] CHRISTOPHER RONALD BERGE Diagnosing Phys: Lorine Bears MD  Sonographer Comments: Technically challenging study due to limited acoustic windows, no apical window and no subcostal window. Image acquisition challenging due to patient body habitus. IMPRESSIONS  1. Left ventricular ejection fraction, by estimation, is 50 to 55%. The left ventricle has low normal function. Left ventricular endocardial border not optimally defined to evaluate regional wall motion. There is mild left ventricular hypertrophy. Left ventricular diastolic function could not be evaluated.  2. Right ventricular systolic function is normal. The right ventricular size is normal. Tricuspid regurgitation signal is inadequate for assessing PA pressure.  3. The mitral valve is normal in structure. No evidence of mitral valve regurgitation. No evidence of mitral stenosis.  4. The aortic valve is normal in structure. Aortic valve regurgitation is not visualized. No aortic stenosis is present.  5. Technically challenging study due to limited acoustic windows, no apical window and no subcostal window. FINDINGS  Left Ventricle: Left ventricular ejection fraction, by estimation, is 50 to 55%. The  left ventricle has low normal function. Left ventricular endocardial border not optimally defined to evaluate regional wall motion. The left ventricular internal cavity  size was normal in size. There is mild left ventricular hypertrophy. Left ventricular diastolic function could not be evaluated. Right Ventricle: The right ventricular size is normal. No increase in right ventricular wall thickness. Right ventricular systolic function is normal. Tricuspid regurgitation signal is inadequate for assessing PA pressure. Left Atrium: Left atrial size was normal in size.  Right Atrium: Right atrial size was normal in size. Pericardium: There is no evidence of pericardial effusion. Mitral Valve: The mitral valve is normal in structure. No evidence of mitral valve regurgitation. No evidence of mitral valve stenosis. Tricuspid Valve: The tricuspid valve is normal in structure. Tricuspid valve regurgitation is not demonstrated. No evidence of tricuspid stenosis. Aortic Valve: The aortic valve is normal in structure. Aortic valve regurgitation is not visualized. No aortic stenosis is present. Pulmonic Valve: The pulmonic valve was normal in structure. Pulmonic valve regurgitation is not visualized. No evidence of pulmonic stenosis. Aorta: The aortic root is normal in size and structure. Venous: The inferior vena cava was not well visualized. IAS/Shunts: No atrial level shunt detected by color flow Doppler.  LEFT VENTRICLE PLAX 2D LVIDd:         4.00 cm LVIDs:         2.90 cm LV PW:         1.00 cm LV IVS:        1.20 cm LVOT diam:     2.00 cm LVOT Area:     3.14 cm  LEFT ATRIUM         Index LA diam:    2.90 cm 1.39 cm/m   AORTA Ao Root diam: 3.20 cm  SHUNTS Systemic Diam: 2.00 cm Kathlyn Sacramento MD Electronically signed by Kathlyn Sacramento MD Signature Date/Time: 03/23/2022/1:04:08 PM    Final     Cardiac Studies   TTE 03/23/2022 1. Left ventricular ejection fraction, by estimation, is 50 to 55%. The  left ventricle has low normal function. Left ventricular endocardial  border not optimally defined to evaluate regional wall motion. There is  mild left ventricular hypertrophy. Left  ventricular diastolic function could not be evaluated.   2. Right ventricular systolic function is normal. The right ventricular  size is normal. Tricuspid regurgitation signal is inadequate for assessing  PA pressure.   3. The mitral valve is normal in structure. No evidence of mitral valve  regurgitation. No evidence of mitral stenosis.   4. The aortic valve is normal in structure. Aortic valve  regurgitation is  not visualized. No aortic stenosis is present.   5. Technically challenging study due to limited acoustic windows, no  apical window and no subcostal window.   Patient Profile     67 y.o. 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  Assessment & Plan    Paroxysmal A-fib with RVR -Maintaining sinus rhythm on metoprolol -Continue Lopressor 50 mg twice daily, Eliquis 5 mg twice daily -Echo with normal EF  2.  History of permanent pacemaker -Establish care with device clinic/EP as outpatient upon discharge  3.  Hypertension, BP controlled -Continue Lopressor  Continue medications as above, follow-up with EP as outpatient upon  discharge.  Please let us know if additional cardiac input is needed.  Cardiology will sign off.      Signed, Kate Sable, MD  03/24/2022, 12:28 PM

## 2022-03-24 NOTE — Progress Notes (Signed)
PHARMACY - PHYSICIAN COMMUNICATION CRITICAL VALUE ALERT - BLOOD CULTURE IDENTIFICATION (BCID)  Mitchell Dixon is an 67 y.o. male who presented to [facility] on 03/20/2022 with a chief complaint of shortness of breath, cough  Late entry - provider contacted 1/23  Assessment:  single set of blood cx from 1/20 grew gram variable rod.  Culture grew clostridium ramosum and providencia stuartii.    Name of physician (or Provider) Contacted: Dr Madaline Brilliant  Current antibiotics: Ceftriaxone and azithromycin  Changes to prescribed antibiotics recommended:  Recommendations accepted by provider - added metronidazole to cover the clostridium   Results for orders placed or performed during the hospital encounter of 03/20/22  Blood Culture ID Panel (Reflexed) (Collected: 03/20/2022  8:44 PM)  Result Value Ref Range   Enterococcus faecalis NOT DETECTED NOT DETECTED   Enterococcus Faecium NOT DETECTED NOT DETECTED   Listeria monocytogenes NOT DETECTED NOT DETECTED   Staphylococcus species NOT DETECTED NOT DETECTED   Staphylococcus aureus (BCID) NOT DETECTED NOT DETECTED   Staphylococcus epidermidis NOT DETECTED NOT DETECTED   Staphylococcus lugdunensis NOT DETECTED NOT DETECTED   Streptococcus species NOT DETECTED NOT DETECTED   Streptococcus agalactiae NOT DETECTED NOT DETECTED   Streptococcus pneumoniae NOT DETECTED NOT DETECTED   Streptococcus pyogenes NOT DETECTED NOT DETECTED   A.calcoaceticus-baumannii NOT DETECTED NOT DETECTED   Bacteroides fragilis NOT DETECTED NOT DETECTED   Enterobacterales NOT DETECTED NOT DETECTED   Enterobacter cloacae complex NOT DETECTED NOT DETECTED   Escherichia coli NOT DETECTED NOT DETECTED   Klebsiella aerogenes NOT DETECTED NOT DETECTED   Klebsiella oxytoca NOT DETECTED NOT DETECTED   Klebsiella pneumoniae NOT DETECTED NOT DETECTED   Proteus species NOT DETECTED NOT DETECTED   Salmonella species NOT DETECTED NOT DETECTED   Serratia marcescens NOT  DETECTED NOT DETECTED   Haemophilus influenzae NOT DETECTED NOT DETECTED   Neisseria meningitidis NOT DETECTED NOT DETECTED   Pseudomonas aeruginosa NOT DETECTED NOT DETECTED   Stenotrophomonas maltophilia NOT DETECTED NOT DETECTED   Candida albicans NOT DETECTED NOT DETECTED   Candida auris NOT DETECTED NOT DETECTED   Candida glabrata NOT DETECTED NOT DETECTED   Candida krusei NOT DETECTED NOT DETECTED   Candida parapsilosis NOT DETECTED NOT DETECTED   Candida tropicalis NOT DETECTED NOT DETECTED   Cryptococcus neoformans/gattii NOT DETECTED NOT DETECTED    Doreene Eland, PharmD, BCPS, BCIDP Work Cell: [phone] 03/24/2022 12:03 PM

## 2022-03-25 DIAGNOSIS — R6520 Severe sepsis without septic shock: Secondary | ICD-10-CM | POA: Diagnosis not present

## 2022-03-25 DIAGNOSIS — A419 Sepsis, unspecified organism: Secondary | ICD-10-CM | POA: Diagnosis not present

## 2022-03-25 LAB — BASIC METABOLIC PANEL
Anion gap: 3 — ABNORMAL LOW (ref 5–15)
BUN: 9 mg/dL (ref 8–23)
CO2: 23 mmol/L (ref 22–32)
Calcium: 7.5 mg/dL — ABNORMAL LOW (ref 8.9–10.3)
Chloride: 107 mmol/L (ref 98–111)
Creatinine, Ser: 1.13 mg/dL (ref 0.61–1.24)
GFR, Estimated: >60 mL/min (ref >60)
Glucose, Bld: 83 mg/dL (ref 70–99)
Potassium: 3.9 mmol/L (ref 3.5–5.1)
Sodium: 133 mmol/L — ABNORMAL LOW (ref 135–145)

## 2022-03-25 LAB — CBC
HCT: 31.7 % — ABNORMAL LOW (ref 39.0–52.0)
Hemoglobin: 9.8 g/dL — ABNORMAL LOW (ref 13.0–17.0)
MCH: 28.7 pg (ref 26.0–34.0)
MCHC: 30.9 g/dL (ref 30.0–36.0)
MCV: 93 fL (ref 80.0–100.0)
Platelets: 288 10*3/uL (ref 150–400)
RBC: 3.41 MIL/uL — ABNORMAL LOW (ref 4.22–5.81)
RDW: 14.9 % (ref 11.5–15.5)
WBC: 12.6 10*3/uL — ABNORMAL HIGH (ref 4.0–10.5)
nRBC: 0.4 % — ABNORMAL HIGH (ref 0.0–0.2)

## 2022-03-25 LAB — MAGNESIUM: Magnesium: 2 mg/dL (ref 1.7–2.4)

## 2022-03-25 LAB — PHOSPHORUS: Phosphorus: 2.3 mg/dL — ABNORMAL LOW (ref 2.5–4.6)

## 2022-03-25 MED ORDER — SULFAMETHOXAZOLE-TRIMETHOPRIM 800-160 MG PO TABS
1.0000 | ORAL_TABLET | Freq: Two times a day (BID) | ORAL | Status: DC
  Administered 2022-03-25 – 2022-03-27 (×4): 1 via ORAL
  Filled 2022-03-25 (×4): qty 1

## 2022-03-25 MED ORDER — METRONIDAZOLE 500 MG PO TABS
500.0000 mg | ORAL_TABLET | Freq: Two times a day (BID) | ORAL | Status: DC
  Administered 2022-03-25 – 2022-03-27 (×4): 500 mg via ORAL
  Filled 2022-03-25 (×4): qty 1

## 2022-03-25 MED ORDER — SODIUM PHOSPHATES 45 MMOLE/15ML IV SOLN
30.0000 mmol | Freq: Once | INTRAVENOUS | Status: AC
  Administered 2022-03-25: 30 mmol via INTRAVENOUS
  Filled 2022-03-25: qty 10

## 2022-03-25 NOTE — Progress Notes (Signed)
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  Patient has neurogenic bladder and self catheterizes every 4 hours.  ED workup shows tachypneic, tachycardic, leukocytosis with a lactic acid of 2.3.  RSV, Influenza, flu negative.  UA shows many bacteria consistent with UTI.  CT abdomen and pelvis showed possible pneumonia as well as increased aneurysmal dilatation compared to 2018 as well as pseudoaneurysm.  ED physician has spoken with vascular surgeon Dr. Imogene Burn who states findings on aorta are likely chronic especially as patient has no chest pain. He recommended treatment for UTI and pneumonia.  Patient is admitted for further management.  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 Continue empiric antibiotics  (ceftriaxone and Zithromax).   Continue IV fluid resuscitation. Lactic acid improved with IV fluid resuscitation. Blood culture shows gram variable rods, sensitivity pending. Flagyl added to cover Clostridium   UTI with hematuria: Multiple bladder calculi with hematuria: Continue empiric Rocephin. Continue IV fluid resuscitation. Urology is consulted, suggested cystolitholapaxy once acute episode is resolved.   Due to multiplicity of stones this will most likely require a staged procedure. SCD for DVT prophylaxis.   Community-acquired pneumonia: Continue empiric antibiotic Rocephin and Zithromax. Continue Antitussives, albuterol as needed, incentive spirometer Continue supplemental oxygen as needed.  Paroxysmal A-fib: He went into atrial fibrillation in the setting of sepsis. He is already converted back to the normal sinus rhythm. Eliquis is on hold in the setting of hematuria and for urological procedure. Continue beta-blocker,  may need to consider antiarrhythmic amiodarone for any recurrence. Eliquis resumed.  History of dissecting aneurysm repair in 01/2017: CT chest showing enlargement of aortic arch to 4.5 compared to 3.7 in 2018 with no periaortic fluid collection. 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 Imogene Burn was consulted from ED and said stable findings. Can consider inpatient vascular consult to follow versus outpatient referral.   Acute kidney injury: > Resolved. Creatinine 1.5, presumed AKI Renal functions improved and back to baseline with fluid resuscitation.   Hypokalemia/ hypophosphatemia: Replaced. Continue to monitor  Chronic complete paraplegia: Continue Supportive care.    Peripheral arterial disease: Does not appear to be on any medication at this time.   History of cardiac pacemaker: No acute issues suspected however last interrogated was on 10/2020 at [facility] EP Pacemaker interrogated.  Essential hypertension: Normotensive.  Hold off blood pressure medication in the setting of sepsis.   DVT prophylaxis: SCDs Code Status: Full code Family Communication: No family at bed side. Disposition Plan:  Status is: Inpatient Remains inpatient appropriate because: Admitted for sepsis sec.to UTI, PNA, requiring IV antibiotics. Urology is consulted.  Recommended procedures once acute infection resolves.   Consultants:  Urology  Procedures:None  Antimicrobials: Ceftriaxone and Zithromax  Subjective: Patient was seen and examined at bedside.  Overnight events noted. Patient still reports feeling weak and tired. Heart rate is now controlled and converted to normal sinus rhythm.   He denies any chest pain or shortness of breath.  Objective: Vitals:   03/25/22 0001 03/25/22 0415 03/25/22 0718 03/25/22 1150  BP: 115/79 121/78 131/82 123/80  Pulse: 80 75 85 80  Resp: 18 16 16 16   Temp: 98.2 F (36.8 C) 98.1 F (36.7 C) 97.8 F (36.6 C) 98.4 F (36.9 C)  TempSrc:   Oral Oral  SpO2: 96% 97% 98% 99%  Weight:      Height:        Intake/Output Summary (Last 24 hours) at 03/25/2022 1514 Last data filed at 03/25/2022 0600 Gross per 24 hour  Intake 320 ml  Output 600 ml  Net -280 ml   Filed Weights   03/20/22 1808  Weight: 90.7 kg    Examination:  General exam: Appears comfortable, not in any distress.  Deconditioned. Respiratory system: CTA bilaterally, respiratory effort normal, RR 13. Cardiovascular system: S1-S2 heard, regular rate and rhythm, no murmur. Gastrointestinal system: Abdomen is soft, non tender, nondistended, BS+ Central nervous system: Alert and oriented x 3, moves upper extremities. Extremities: No edema, no cyanosis, no clubbing. Skin: No rashes, lesions or ulcers Psychiatry: Judgement and insight appear normal. Mood & affect appropriate.     Data Reviewed: I have personally reviewed following labs and imaging studies  CBC: Recent Labs   Lab 03/20/22 1859 03/21/22 0433 03/22/22 0454 03/24/22 0448 03/25/22 0633  WBC 26.7* 20.5* 20.5* 12.0* 12.6*  NEUTROABS 24.8*  --   --   --   --   HGB 12.4* 10.1* 10.0* 9.9* 9.8*  HCT 40.0 31.7* 32.1* 31.6* 31.7*  MCV 94.6 91.9 92.0 92.1 93.0  PLT 186 157 184 226 834   Basic Metabolic Panel: Recent Labs  Lab 03/21/22 0433 03/22/22 0454 03/23/22 0509 03/24/22 0448 03/25/22 0633  NA 135 134* 133* 133* 133*  K 2.9* 3.0* 3.1* 3.6 3.9  CL 106 105 104 109 107  CO2 20* 19* 22 21* 23  GLUCOSE 82 73 87 93 83  BUN 24* 17 14 9 9   CREATININE 1.20 1.07 1.12 1.01 1.13  CALCIUM 7.6* 7.7* 7.5* 7.4* 7.5*  MG  --  1.7 2.1 2.1 2.0  PHOS  --  1.2*  --  2.0* 2.3*   GFR: Estimated Creatinine Clearance: 74.8 mL/min (by C-G formula based on SCr of 1.13 mg/dL). Liver Function Tests: Recent Labs  Lab 03/20/22 1859  AST 33  ALT 12  ALKPHOS 129*  BILITOT 2.3*  PROT 7.6  ALBUMIN 3.0*   Recent Labs  Lab 03/20/22 1859  LIPASE 31   No results for input(s): "AMMONIA" in the last 168 hours. Coagulation Profile: Recent Labs  Lab 03/21/22 0433  INR 1.7*   Cardiac Enzymes: No results for input(s): "CKTOTAL", "CKMB", "CKMBINDEX", "TROPONINI" in the last 168 hours. BNP (last 3 results) No results for input(s): "PROBNP" in the last 8760 hours. HbA1C: No results for input(s): "HGBA1C"  in the last 72 hours. CBG: No results for input(s): "GLUCAP" in the last 168 hours. Lipid Profile: No results for input(s): "CHOL", "HDL", "LDLCALC", "TRIG", "CHOLHDL", "LDLDIRECT" in the last 72 hours. Thyroid Function Tests: No results for input(s): "TSH", "T4TOTAL", "FREET4", "T3FREE", "THYROIDAB" in the last 72 hours. Anemia Panel: No results for input(s): "VITAMINB12", "FOLATE", "FERRITIN", "TIBC", "IRON", "RETICCTPCT" in the last 72 hours. Sepsis Labs: Recent Labs  Lab 03/20/22 1859 03/20/22 2059 03/21/22 0433 03/22/22 2224  PROCALCITON  --   --  22.70  --   LATICACIDVEN 3.7* 2.3*  --  0.9     Recent Results (from the past 240 hour(s))  Resp panel by RT-PCR (RSV, Flu A&B, Covid) Anterior Nasal Swab     Status: None   Collection Time: 03/20/22  7:00 PM   Specimen: Anterior Nasal Swab  Result Value Ref Range Status   SARS Coronavirus 2 by RT PCR NEGATIVE NEGATIVE Final    Comment: (NOTE) SARS-CoV-2 target nucleic acids are NOT DETECTED.  The SARS-CoV-2 RNA is generally detectable in upper respiratory specimens during the acute phase of infection. The lowest concentration of SARS-CoV-2 viral copies this assay can detect is 138 copies/mL. A negative result does not preclude SARS-Cov-2 infection and should not be used as the sole basis for treatment or other patient management decisions. A negative result may occur with  improper specimen collection/handling, submission of specimen other than nasopharyngeal swab, presence of viral mutation(s) within the areas targeted by this assay, and inadequate number of viral copies(<138 copies/mL). A negative result must be combined with clinical observations, patient history, and epidemiological information. The expected result is Negative.  Fact Sheet for Patients:  EntrepreneurPulse.com.au  Fact Sheet for Healthcare Providers:  IncredibleEmployment.be  This test is no t yet approved or cleared by the [place] FDA and  has been authorized for detection and/or diagnosis of SARS-CoV-2 by FDA under an Emergency Use Authorization (EUA). This EUA will remain  in effect (meaning this test can be used) for the duration of the COVID-19 declaration under Section 564(b)(1) of the Act, 21 U.S.C.section 360bbb-3(b)(1), unless the authorization is terminated  or revoked sooner.       Influenza A by PCR NEGATIVE NEGATIVE Final   Influenza B by PCR NEGATIVE NEGATIVE Final    Comment: (NOTE) The Xpert Xpress SARS-CoV-2/FLU/RSV plus assay is intended as an aid in the diagnosis of influenza from  Nasopharyngeal swab specimens and should not be used as a sole basis for treatment. Nasal washings and aspirates are unacceptable for Xpert Xpress SARS-CoV-2/FLU/RSV testing.  Fact Sheet for Patients: EntrepreneurPulse.com.au  Fact Sheet for Healthcare Providers: IncredibleEmployment.be  This test is not yet approved or cleared by the [place] FDA and has been authorized for detection and/or diagnosis of SARS-CoV-2 by FDA under an Emergency Use Authorization (EUA). This EUA will remain in effect (meaning this test can be used) for the duration of the COVID-19 declaration under Section 564(b)(1) of the Act, 21 U.S.C. section 360bbb-3(b)(1), unless the authorization is terminated or revoked.     Resp Syncytial Virus by PCR NEGATIVE NEGATIVE Final    Comment: (NOTE) Fact Sheet for Patients: EntrepreneurPulse.com.au  Fact Sheet for Healthcare Providers: IncredibleEmployment.be  This test is not yet approved or cleared by the [place] FDA and has been authorized for detection and/or diagnosis of SARS-CoV-2 by FDA under an Emergency Use Authorization (EUA). This EUA will remain in effect (meaning this test can be used) for the duration of the COVID-19 declaration  under Section 564(b)(1) of the Act, 21 U.S.C. section 360bbb-3(b)(1), unless the authorization is terminated or revoked.  Performed at [facility], [address] [place], [place] [address]   Urine Culture     Status: Abnormal   Collection Time: 03/20/22  8:39 PM   Specimen: In/Out Cath Urine  Result Value Ref Range Status   Specimen Description   Final    IN/OUT CATH URINE Performed at [facility], [address], [place], [place] [address]    Special Requests   Final    NONE Performed at [facility], [address] [place], [place] [address]    Culture MULTIPLE SPECIES PRESENT, SUGGEST RECOLLECTION  (A)  Final   Report Status 03/22/2022 FINAL  Final  Blood Culture (routine x 2)     Status: Abnormal   Collection Time: 03/20/22  8:44 PM   Specimen: BLOOD  Result Value Ref Range Status   Specimen Description   Final    BLOOD BLOOD RIGHT FOREARM Performed at [facility], [address], [place], [place] [address]    Special Requests   Final    BOTTLES DRAWN AEROBIC AND ANAEROBIC Blood Culture adequate volume Performed at [facility], [address] [place], [place] [address]    Culture  Setup Time (A)  Final    GRAM VARIABLE ROD ANAEROBIC BOTTLE ONLY CRITICAL RESULT CALLED TO, READ BACK BY AND VERIFIED WITH: JUSTIN MILLER 03/21/22 1636 AMK    Culture (A)  Final    CLOSTRIDIUM RAMOSUM PROVIDENCIA STUARTII Standardized susceptibility testing for this organism is not available. FOR CLOSTRIDIUM RAMOSUM Performed at [facility], [address], [place], [place] [address]    Report Status 03/24/2022 FINAL  Final   Organism ID, Bacteria PROVIDENCIA STUARTII  Final      Susceptibility   Providencia stuartii - MIC*    AMPICILLIN RESISTANT Resistant     CEFAZOLIN >=64 RESISTANT Resistant     CEFEPIME <=0.12 SENSITIVE Sensitive     CEFTAZIDIME <=1 SENSITIVE Sensitive     CEFTRIAXONE <=0.25 SENSITIVE Sensitive     CIPROFLOXACIN <=0.25 SENSITIVE Sensitive     GENTAMICIN RESISTANT Resistant     IMIPENEM 2 SENSITIVE Sensitive     TRIMETH/SULFA <=20 SENSITIVE Sensitive     AMPICILLIN/SULBACTAM 16 INTERMEDIATE Intermediate     PIP/TAZO <=4 SENSITIVE Sensitive     * PROVIDENCIA STUARTII  Blood Culture ID Panel (Reflexed)     Status: None   Collection Time: 03/20/22  8:44 PM  Result Value Ref Range Status   Enterococcus faecalis NOT DETECTED NOT DETECTED Final   Enterococcus Faecium NOT DETECTED NOT DETECTED Final   Listeria monocytogenes NOT DETECTED NOT DETECTED Final   Staphylococcus species NOT DETECTED NOT DETECTED Final   Staphylococcus aureus (BCID)  NOT DETECTED NOT DETECTED Final   Staphylococcus epidermidis NOT DETECTED NOT DETECTED Final   Staphylococcus lugdunensis NOT DETECTED NOT DETECTED Final   Streptococcus species NOT DETECTED NOT DETECTED Final   Streptococcus agalactiae NOT DETECTED NOT DETECTED Final   Streptococcus pneumoniae NOT DETECTED NOT DETECTED Final   Streptococcus pyogenes NOT DETECTED NOT DETECTED Final   A.calcoaceticus-baumannii NOT DETECTED NOT DETECTED Final   Bacteroides fragilis NOT DETECTED NOT DETECTED Final   Enterobacterales NOT DETECTED NOT DETECTED Final   Enterobacter cloacae complex NOT DETECTED NOT DETECTED Final   Escherichia coli NOT DETECTED NOT DETECTED Final   Klebsiella aerogenes NOT DETECTED NOT DETECTED Final   Klebsiella oxytoca NOT DETECTED NOT  DETECTED Final   Klebsiella pneumoniae NOT DETECTED NOT DETECTED Final   Proteus species NOT DETECTED NOT DETECTED Final   Salmonella species NOT DETECTED NOT DETECTED Final   Serratia marcescens NOT DETECTED NOT DETECTED Final   Haemophilus influenzae NOT DETECTED NOT DETECTED Final   Neisseria meningitidis NOT DETECTED NOT DETECTED Final   Pseudomonas aeruginosa NOT DETECTED NOT DETECTED Final   Stenotrophomonas maltophilia NOT DETECTED NOT DETECTED Final   Candida albicans NOT DETECTED NOT DETECTED Final   Candida auris NOT DETECTED NOT DETECTED Final   Candida glabrata NOT DETECTED NOT DETECTED Final   Candida krusei NOT DETECTED NOT DETECTED Final   Candida parapsilosis NOT DETECTED NOT DETECTED Final   Candida tropicalis NOT DETECTED NOT DETECTED Final   Cryptococcus neoformans/gattii NOT DETECTED NOT DETECTED Final    Comment: Performed at [facility], [address], [place], [place] [address]    Radiology Studies: No results found.  Scheduled Meds:  apixaban  5 mg Oral BID   atorvastatin  40 mg Oral QPM   Chlorhexidine Gluconate Cloth  6 each Topical Daily   guaiFENesin  600 mg Oral BID   metoprolol tartrate  50 mg  Oral BID   metroNIDAZOLE  500 mg Oral Q12H   phosphorus  250 mg Oral TID with meals   sulfamethoxazole-trimethoprim  1 tablet Oral Q12H   Continuous Infusions:  sodium phosphate 30 mmol in dextrose 5 % 250 mL infusion 30 mmol (03/25/22 1116)     LOS: 5 days    Time spent: 35 mins   Dade Rodin, MD [facility]   If 7PM-7AM, please contact night-coverage

## 2022-03-25 NOTE — Plan of Care (Signed)

## 2022-03-26 ENCOUNTER — Telehealth: Payer: Self-pay | Admitting: Nurse Practitioner

## 2022-03-26 DIAGNOSIS — A419 Sepsis, unspecified organism: Secondary | ICD-10-CM | POA: Diagnosis not present

## 2022-03-26 DIAGNOSIS — R6520 Severe sepsis without septic shock: Secondary | ICD-10-CM | POA: Diagnosis not present

## 2022-03-26 LAB — CBC
HCT: 32.2 % — ABNORMAL LOW (ref 39.0–52.0)
Hemoglobin: 10 g/dL — ABNORMAL LOW (ref 13.0–17.0)
MCH: 28.6 pg (ref 26.0–34.0)
MCHC: 31.1 g/dL (ref 30.0–36.0)
MCV: 92 fL (ref 80.0–100.0)
Platelets: 344 10*3/uL (ref 150–400)
RBC: 3.5 MIL/uL — ABNORMAL LOW (ref 4.22–5.81)
RDW: 15 % (ref 11.5–15.5)
WBC: 13.9 10*3/uL — ABNORMAL HIGH (ref 4.0–10.5)
nRBC: 0.3 % — ABNORMAL HIGH (ref 0.0–0.2)

## 2022-03-26 LAB — BASIC METABOLIC PANEL
Anion gap: 8 (ref 5–15)
BUN: 9 mg/dL (ref 8–23)
CO2: 20 mmol/L — ABNORMAL LOW (ref 22–32)
Calcium: 7.4 mg/dL — ABNORMAL LOW (ref 8.9–10.3)
Chloride: 107 mmol/L (ref 98–111)
Creatinine, Ser: 0.99 mg/dL (ref 0.61–1.24)
GFR, Estimated: >60 mL/min (ref >60)
Glucose, Bld: 82 mg/dL (ref 70–99)
Potassium: 3.8 mmol/L (ref 3.5–5.1)
Sodium: 135 mmol/L (ref 135–145)

## 2022-03-26 LAB — MAGNESIUM: Magnesium: 1.9 mg/dL (ref 1.7–2.4)

## 2022-03-26 LAB — PHOSPHORUS: Phosphorus: 3.6 mg/dL (ref 2.5–4.6)

## 2022-03-26 NOTE — Progress Notes (Signed)
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  Patient has neurogenic bladder and self catheterizes every 4 hours.  ED workup shows tachypneic, tachycardic, leukocytosis with a lactic acid of 2.3.  RSV, Influenza, flu negative.  UA shows many bacteria consistent with UTI.  CT abdomen and pelvis showed possible pneumonia as well as increased aneurysmal dilatation compared to 2018 as well as pseudoaneurysm.  ED physician has spoken with vascular surgeon Dr. Imogene Burn who states findings on aorta are likely chronic especially as patient has no chest pain. He recommended treatment for UTI and pneumonia.  Patient is admitted for further management.  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 Continue empiric antibiotics  (ceftriaxone and Zithromax).   Continue IV fluid resuscitation. Lactic acid improved with IV fluid resuscitation. Blood culture shows gram variable rods, sensitivity pending. Flagyl added to cover Clostridium as per pharmacy Antibiotics changed to Bactrim and Flagyl.  Sepsis physiology resolved.   UTI with hematuria: Multiple bladder calculi with hematuria: Initiated on Rocephin.  Antibiotics changed to Bactrim and Flagyl. Continue IV fluid resuscitation. Urology is consulted, suggested cystolitholapaxy once acute episode is resolved.   Due to multiplicity of stones this will most likely require a staged procedure. SCD for DVT prophylaxis. Follow-up with urology for further recommendation.   Community-acquired pneumonia: Initiated on empiric antibiotic Rocephin and Zithromax. Continue Antitussives, albuterol as needed, incentive spirometer Continue supplemental oxygen as needed. Antibiotic changed to Bactrim DS.  Paroxysmal A-fib: He went into atrial fibrillation in the setting of sepsis. He is already converted back to the normal sinus rhythm. Eliquis was on hold in the setting of hematuria and for urological procedure. Continue beta-blocker,  may need to consider antiarrhythmic amiodarone for any recurrence. Eliquis resumed. HR remains controlled.  History of dissecting aneurysm repair in 01/2017: CT chest showing enlargement of aortic arch to 4.5 compared to 3.7 in 2018 with no periaortic fluid collection. 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 Imogene Burn was consulted from ED and said stable findings. Can consider inpatient vascular consult to follow versus outpatient referral.   Acute kidney injury: > Resolved. Creatinine 1.5, presumed AKI Renal functions improved and back to baseline with fluid resuscitation.   Hypokalemia/ hypophosphatemia: Replaced. Continue to monitor  Chronic complete paraplegia: Continue  Supportive care.    Peripheral arterial  disease: Does not appear to be on any medication at this time.   History of cardiac pacemaker: No acute issues suspected however last interrogated was on 10/2020 at [facility] interrogated.   Essential hypertension: Normotensive.  Hold off blood pressure medication in the setting of sepsis.   DVT prophylaxis: SCDs Code Status: Full code Family Communication: No family at bed side. Disposition Plan:  Status is: Inpatient Remains inpatient appropriate because: Admitted for sepsis sec.to UTI, PNA, requiring IV antibiotics. Urology is consulted.  Recommended procedures once acute infection resolves.   Consultants:  Urology  Procedures:None  Antimicrobials: Ceftriaxone and Zithromax  Subjective: Patient was seen and examined at bedside.  Overnight events noted. Patient still reports feeling weak and tired.  Heart rate is controlled and converted to normal sinus rhythm. He denies any chest pain or shortness of breath.  Objective: Vitals:   03/25/22 1533 03/25/22 2106 03/25/22 2332 03/26/22 0330  BP: 132/85 134/83 119/80 132/77  Pulse: 86 89 82 86  Resp: 16 18 20 18   Temp: 98.1 F (36.7 C)  98.2 F (36.8 C) 98.1 F (36.7 C)  TempSrc: Oral     SpO2: 96% 98% 96% 95%  Weight:      Height:        Intake/Output Summary (Last 24 hours) at 03/26/2022 1302 Last data filed at 03/26/2022 1056 Gross per 24 hour  Intake 490 ml  Output 700 ml  Net -210 ml   Filed Weights   03/20/22 1808  Weight: 90.7 kg    Examination:  General exam: Appears comfortable, not in any acute distress.  Deconditioned. Respiratory system: CTA bilaterally, respiratory effort normal, RR 15. Cardiovascular system: S1-S2 heard, regular rate and rhythm, no murmur. Gastrointestinal system: Abdomen is soft, non tender, nondistended, BS+ Central nervous system: Alert and oriented x 3, moves upper extremities, paraplegic Extremities: No edema, no cyanosis,  no clubbing. Skin: No rashes, lesions or ulcers Psychiatry: Judgement and insight appear normal. Mood & affect appropriate.     Data Reviewed: I have personally reviewed following labs and imaging studies  CBC: Recent Labs  Lab 03/20/22 1859 03/21/22 0433 03/22/22 0454 03/24/22 0448 03/25/22 0633 03/26/22 0635  WBC 26.7* 20.5* 20.5* 12.0* 12.6* 13.9*  NEUTROABS 24.8*  --   --   --   --   --   HGB 12.4* 10.1* 10.0* 9.9* 9.8* 10.0*  HCT 40.0 31.7* 32.1* 31.6* 31.7* 32.2*  MCV 94.6 91.9 92.0 92.1 93.0 92.0  PLT 186 157 184 226 288 725   Basic Metabolic Panel: Recent Labs  Lab 03/22/22 0454 03/23/22 0509 03/24/22 0448 03/25/22 0633 03/26/22 0635  NA 134* 133* 133* 133* 135  K 3.0* 3.1* 3.6 3.9 3.8  CL 105 104 109 107 107  CO2 19* 22 21* 23 20*  GLUCOSE 73 87 93 83 82  BUN 17 14 9 9 9   CREATININE 1.07 1.12 1.01 1.13 0.99  CALCIUM 7.7* 7.5* 7.4* 7.5* 7.4*  MG 1.7 2.1 2.1 2.0 1.9  PHOS 1.2*  --  2.0* 2.3* 3.6   GFR: Estimated Creatinine Clearance: 85.4 mL/min (by C-G formula based on SCr of 0.99 mg/dL). Liver Function Tests: Recent Labs  Lab 03/20/22 1859  AST 33  ALT 12  ALKPHOS 129*  BILITOT 2.3*  PROT 7.6  ALBUMIN 3.0*   Recent Labs  Lab 03/20/22 1859  LIPASE 31   No results for input(s): "AMMONIA" in the last 168 hours. Coagulation Profile: Recent Labs  Lab 03/21/22 0433  INR  1.7*   Cardiac Enzymes: No results for input(s): "CKTOTAL", "CKMB", "CKMBINDEX", "TROPONINI" in the last 168 hours. BNP (last 3 results) No results for input(s): "PROBNP" in the last 8760 hours. HbA1C: No results for input(s): "HGBA1C" in the last 72 hours. CBG: No results for input(s): "GLUCAP" in the last 168 hours. Lipid Profile: No results for input(s): "CHOL", "HDL", "LDLCALC", "TRIG", "CHOLHDL", "LDLDIRECT" in the last 72 hours. Thyroid Function Tests: No results for input(s): "TSH", "T4TOTAL", "FREET4", "T3FREE", "THYROIDAB" in the last 72 hours. Anemia  Panel: No results for input(s): "VITAMINB12", "FOLATE", "FERRITIN", "TIBC", "IRON", "RETICCTPCT" in the last 72 hours. Sepsis Labs: Recent Labs  Lab 03/20/22 1859 03/20/22 2059 03/21/22 0433 03/22/22 2224  PROCALCITON  --   --  22.70  --   LATICACIDVEN 3.7* 2.3*  --  0.9    Recent Results (from the past 240 hour(s))  Resp panel by RT-PCR (RSV, Flu A&B, Covid) Anterior Nasal Swab     Status: None   Collection Time: 03/20/22  7:00 PM   Specimen: Anterior Nasal Swab  Result Value Ref Range Status   SARS Coronavirus 2 by RT PCR NEGATIVE NEGATIVE Final    Comment: (NOTE) SARS-CoV-2 target nucleic acids are NOT DETECTED.  The SARS-CoV-2 RNA is generally detectable in upper respiratory specimens during the acute phase of infection. The lowest concentration of SARS-CoV-2 viral copies this assay can detect is 138 copies/mL. A negative result does not preclude SARS-Cov-2 infection and should not be used as the sole basis for treatment or other patient management decisions. A negative result may occur with  improper specimen collection/handling, submission of specimen other than nasopharyngeal swab, presence of viral mutation(s) within the areas targeted by this assay, and inadequate number of viral copies(<138 copies/mL). A negative result must be combined with clinical observations, patient history, and epidemiological information. The expected result is Negative.  Fact Sheet for Patients:  BloggerCourse.com  Fact Sheet for Healthcare Providers:  SeriousBroker.it  This test is no t yet approved or cleared by the [place] FDA and  has been authorized for detection and/or diagnosis of SARS-CoV-2 by FDA under an Emergency Use Authorization (EUA). This EUA will remain  in effect (meaning this test can be used) for the duration of the COVID-19 declaration under Section 564(b)(1) of the Act, 21 U.S.C.section 360bbb-3(b)(1), unless  the authorization is terminated  or revoked sooner.       Influenza A by PCR NEGATIVE NEGATIVE Final   Influenza B by PCR NEGATIVE NEGATIVE Final    Comment: (NOTE) The Xpert Xpress SARS-CoV-2/FLU/RSV plus assay is intended as an aid in the diagnosis of influenza from Nasopharyngeal swab specimens and should not be used as a sole basis for treatment. Nasal washings and aspirates are unacceptable for Xpert Xpress SARS-CoV-2/FLU/RSV testing.  Fact Sheet for Patients: BloggerCourse.com  Fact Sheet for Healthcare Providers: SeriousBroker.it  This test is not yet approved or cleared by the [place] FDA and has been authorized for detection and/or diagnosis of SARS-CoV-2 by FDA under an Emergency Use Authorization (EUA). This EUA will remain in effect (meaning this test can be used) for the duration of the COVID-19 declaration under Section 564(b)(1) of the Act, 21 U.S.C. section 360bbb-3(b)(1), unless the authorization is terminated or revoked.     Resp Syncytial Virus by PCR NEGATIVE NEGATIVE Final    Comment: (NOTE) Fact Sheet for Patients: BloggerCourse.com  Fact Sheet for Healthcare Providers: SeriousBroker.it  This test is not yet approved or cleared by the [place]  FDA and has been authorized for detection and/or diagnosis of SARS-CoV-2 by FDA under an Emergency Use Authorization (EUA). This EUA will remain in effect (meaning this test can be used) for the duration of the COVID-19 declaration under Section 564(b)(1) of the Act, 21 U.S.C. section 360bbb-3(b)(1), unless the authorization is terminated or revoked.  Performed at [facility], [address] [place], [place] [address]   Urine Culture     Status: Abnormal   Collection Time: 03/20/22  8:39 PM   Specimen: In/Out Cath Urine  Result Value Ref Range Status   Specimen Description   Final     IN/OUT CATH URINE Performed at [facility], [address], [place], [place] [address]    Special Requests   Final    NONE Performed at [facility], [address] [place], [place] [address]    Culture MULTIPLE SPECIES PRESENT, SUGGEST RECOLLECTION (A)  Final   Report Status 03/22/2022 FINAL  Final  Blood Culture (routine x 2)     Status: Abnormal   Collection Time: 03/20/22  8:44 PM   Specimen: BLOOD  Result Value Ref Range Status   Specimen Description   Final    BLOOD BLOOD RIGHT FOREARM Performed at [facility], [address], [place], [place] [address]    Special Requests   Final    BOTTLES DRAWN AEROBIC AND ANAEROBIC Blood Culture adequate volume Performed at [facility], [address] [place], [place] [address]    Culture  Setup Time (A)  Final    GRAM VARIABLE ROD ANAEROBIC BOTTLE ONLY CRITICAL RESULT CALLED TO, READ BACK BY AND VERIFIED WITH: JUSTIN MILLER 03/21/22 1636 AMK    Culture (A)  Final    CLOSTRIDIUM RAMOSUM PROVIDENCIA STUARTII Standardized susceptibility testing for this organism is not available. FOR CLOSTRIDIUM RAMOSUM Performed at [facility], [place] [address], [place],  [address]    Report Status 03/24/2022 FINAL  Final   Organism ID, Bacteria PROVIDENCIA STUARTII  Final      Susceptibility   Providencia stuartii - MIC*    AMPICILLIN RESISTANT Resistant     CEFAZOLIN >=64 RESISTANT Resistant     CEFEPIME <=0.12 SENSITIVE Sensitive     CEFTAZIDIME <=1 SENSITIVE Sensitive     CEFTRIAXONE <=0.25 SENSITIVE Sensitive     CIPROFLOXACIN <=0.25 SENSITIVE Sensitive     GENTAMICIN RESISTANT Resistant     IMIPENEM 2 SENSITIVE Sensitive     TRIMETH/SULFA <=20 SENSITIVE Sensitive     AMPICILLIN/SULBACTAM 16 INTERMEDIATE Intermediate     PIP/TAZO <=4 SENSITIVE Sensitive     * PROVIDENCIA STUARTII  Blood Culture ID Panel (Reflexed)     Status: None   Collection Time: 03/20/22  8:44 PM  Result  Value Ref Range Status   Enterococcus faecalis NOT DETECTED NOT DETECTED Final   Enterococcus Faecium NOT DETECTED NOT DETECTED Final   Listeria monocytogenes NOT DETECTED NOT DETECTED Final   Staphylococcus species NOT DETECTED NOT DETECTED Final   Staphylococcus aureus (BCID) NOT DETECTED NOT DETECTED Final   Staphylococcus epidermidis NOT DETECTED NOT DETECTED Final   Staphylococcus lugdunensis NOT DETECTED NOT DETECTED Final   Streptococcus species NOT DETECTED NOT DETECTED Final   Streptococcus agalactiae NOT DETECTED NOT DETECTED Final   Streptococcus pneumoniae NOT DETECTED NOT DETECTED Final   Streptococcus pyogenes NOT DETECTED NOT DETECTED Final   A.calcoaceticus-baumannii NOT DETECTED NOT DETECTED Final   Bacteroides fragilis NOT DETECTED NOT DETECTED Final   Enterobacterales NOT DETECTED  NOT DETECTED Final   Enterobacter cloacae complex NOT DETECTED NOT DETECTED Final   Escherichia coli NOT DETECTED NOT DETECTED Final   Klebsiella aerogenes NOT DETECTED NOT DETECTED Final   Klebsiella oxytoca NOT DETECTED NOT DETECTED Final   Klebsiella pneumoniae NOT DETECTED NOT DETECTED Final   Proteus species NOT DETECTED NOT DETECTED Final   Salmonella species NOT DETECTED NOT DETECTED Final   Serratia marcescens NOT DETECTED NOT DETECTED Final   Haemophilus influenzae NOT DETECTED NOT DETECTED Final   Neisseria meningitidis NOT DETECTED NOT DETECTED Final   Pseudomonas aeruginosa NOT DETECTED NOT DETECTED Final   Stenotrophomonas maltophilia NOT DETECTED NOT DETECTED Final   Candida albicans NOT DETECTED NOT DETECTED Final   Candida auris NOT DETECTED NOT DETECTED Final   Candida glabrata NOT DETECTED NOT DETECTED Final   Candida krusei NOT DETECTED NOT DETECTED Final   Candida parapsilosis NOT DETECTED NOT DETECTED Final   Candida tropicalis NOT DETECTED NOT DETECTED Final   Cryptococcus neoformans/gattii NOT DETECTED NOT DETECTED Final    Comment: Performed at [facility], [address] [place], [place] [address]    Radiology Studies: No results found.  Scheduled Meds:  apixaban  5 mg Oral BID   atorvastatin  40 mg Oral QPM   Chlorhexidine Gluconate Cloth  6 each Topical Daily   guaiFENesin  600 mg Oral BID   metoprolol tartrate  50 mg Oral BID   metroNIDAZOLE  500 mg Oral Q12H   sulfamethoxazole-trimethoprim  1 tablet Oral Q12H   Continuous Infusions:     LOS: 6 days    Time spent: 35 mins   Lielle Vandervort, MD [facility]   If 7PM-7AM, please contact night-coverage

## 2022-03-26 NOTE — Telephone Encounter (Signed)
-----  Message from Theora Gianotti, NP sent at 03/24/2022  1:50 PM EST ----- Mitchell Dixon,  This guy will prob go home in the next few days.  We just signed off today.  He needs APP f/u in the next 2-4 wks and also needs to establish w EP b/c he has a PPM that hasn't been looked at in years (prev followed @ [facility]).  He's paraplegic and bedridden, so you may have to drive to his home to get him and bring him in.  Thanks,  Gerald Stabs

## 2022-03-26 NOTE — Telephone Encounter (Signed)
LVM to call and schedule follow up appointments

## 2022-03-27 DIAGNOSIS — R6520 Severe sepsis without septic shock: Secondary | ICD-10-CM | POA: Diagnosis not present

## 2022-03-27 DIAGNOSIS — A419 Sepsis, unspecified organism: Secondary | ICD-10-CM | POA: Diagnosis not present

## 2022-03-27 LAB — CBC
HCT: 32.5 % — ABNORMAL LOW (ref 39.0–52.0)
Hemoglobin: 10.2 g/dL — ABNORMAL LOW (ref 13.0–17.0)
MCH: 28.7 pg (ref 26.0–34.0)
MCHC: 31.4 g/dL (ref 30.0–36.0)
MCV: 91.3 fL (ref 80.0–100.0)
Platelets: 398 10*3/uL (ref 150–400)
RBC: 3.56 MIL/uL — ABNORMAL LOW (ref 4.22–5.81)
RDW: 15.2 % (ref 11.5–15.5)
WBC: 12.9 10*3/uL — ABNORMAL HIGH (ref 4.0–10.5)
nRBC: 0.2 % (ref 0.0–0.2)

## 2022-03-27 LAB — BASIC METABOLIC PANEL
Anion gap: 8 (ref 5–15)
BUN: 7 mg/dL — ABNORMAL LOW (ref 8–23)
CO2: 19 mmol/L — ABNORMAL LOW (ref 22–32)
Calcium: 7.5 mg/dL — ABNORMAL LOW (ref 8.9–10.3)
Chloride: 104 mmol/L (ref 98–111)
Creatinine, Ser: 1.17 mg/dL (ref 0.61–1.24)
GFR, Estimated: >60 mL/min (ref >60)
Glucose, Bld: 86 mg/dL (ref 70–99)
Potassium: 3.7 mmol/L (ref 3.5–5.1)
Sodium: 131 mmol/L — ABNORMAL LOW (ref 135–145)

## 2022-03-27 LAB — PHOSPHORUS: Phosphorus: 3.1 mg/dL (ref 2.5–4.6)

## 2022-03-27 LAB — MAGNESIUM: Magnesium: 2 mg/dL (ref 1.7–2.4)

## 2022-03-27 MED ORDER — SULFAMETHOXAZOLE-TRIMETHOPRIM 800-160 MG PO TABS: 1.0000 | ORAL_TABLET | Freq: Two times a day (BID) | ORAL | 0 refills | Status: DC

## 2022-03-27 MED ORDER — METRONIDAZOLE 500 MG PO TABS: 500.0000 mg | ORAL_TABLET | Freq: Two times a day (BID) | ORAL | 0 refills | Status: DC

## 2022-03-27 MED ORDER — SULFAMETHOXAZOLE-TRIMETHOPRIM 800-160 MG PO TABS: 1.0000 | ORAL_TABLET | Freq: Two times a day (BID) | ORAL | 0 refills | Status: AC

## 2022-03-27 MED ORDER — METRONIDAZOLE 500 MG PO TABS: 500.0000 mg | ORAL_TABLET | Freq: Two times a day (BID) | ORAL | 0 refills | Status: AC

## 2022-03-27 NOTE — TOC Transition Note (Signed)
Transition of Care [facility]) - CM/SW Discharge Note   Patient Details  Name: Mitchell Dixon MRN: [id number] Date of Birth: 08/14/1955  [facility] CM/SW Contact:  Izola Price, RN Phone Number: 03/27/2022, 12:42 PM   Clinical Narrative:  1/27: Patient is discharging to home today. Foley to be left in place per urology till follow up visit. Sister and brother to transport to home via [employer]i-cap [facility]i so will also have enough help to get patient into home.    Discharge instructions per provider as follows:  Follow up with your PCP within 7 days to make sure you are continuing to improve. Follow up with Urology as recommended by them. Your PCP will need to schedule you for cardiology to have your pacemaker checked   Simmie Davies RN CM        Patient Goals and CMS Choice      Discharge Placement                         Discharge Plan and Services Additional resources added to the After Visit Summary for                                       Social Determinants of Health (SDOH) Interventions SDOH Screenings   Tobacco Use: High Risk (03/22/2022)     Readmission Risk Interventions     No data to display

## 2022-03-27 NOTE — Progress Notes (Signed)
Along with my nurse tech, I offered to reposition patient in bed; patient refused. Patient did allow [place] to check for incontinence. Will continue to monitor.

## 2022-03-27 NOTE — Discharge Summary (Signed)
Mitchell Dixon BJY:[id number] DOB: 09/20/1955 DOA: 03/20/2022  PCP: Emogene Morgan, MD  Admit date: 03/20/2022  Discharge date: 03/27/2022  Admitted From: home   Disposition:  home   Recommendations for Outpatient Follow-up:   Follow-up with urology as recommended by them Follow up with PCP in 1 weeks, patient will need repeat CBC and BMP at that time PCP Please note: Patient will need EP follow-up to check pacemaker   Home Health: N/A Equipment/Devices: No new DME noted Consultations: Urology, cardiology Discharge Condition: Improved CODE STATUS: Full Diet Recommendation: Heart Healthy   Diet Order             Diet Heart Room service appropriate? 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  Patient has neurogenic bladder and self catheterizes every 4 hours.  ED workup shows tachypneic, tachycardic, leukocytosis with a lactic acid of 2.3.  RSV, Influenza, flu negative.  UA shows many bacteria consistent with UTI.  CT abdomen and pelvis showed possible pneumonia as well as increased aneurysmal dilatation compared to 2018 as well as pseudoaneurysm.  ED physician has spoken with vascular surgeon Dr. Imogene Burn who states findings on  aorta are likely chronic especially as patient has no chest pain. He recommended treatment for UTI and pneumonia.  Patient is admitted for further management.                                                                   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   Patient was treated empirically with ceftriaxone and Zithromax and fluid resuscitation.   Sepsis physiology resolved, lactic acid was improved Blood culture shows gram variable rods, sensitivity pending. Flagyl added to cover Clostridium as per pharmacy Patient was initially treated with ceftriaxone and azithromycin and then switched to p.o. Bactrim and Flagyl.  Patient is to complete a 10-day course of antibiotics.  Patient is discharged home on another 3 days of Bactrim and Flagyl to complete the 10-day course.   UTI with hematuria: Multiple bladder calculi with hematuria: Initiated on Rocephin.  Antibiotics changed to Bactrim and Flagyl. Continue IV fluid resuscitation. Urology is consulted, suggested cystolitholapaxy once acute episode is resolved.   Due to multiplicity of stones this will most likely require a staged procedure. Patient is discharged home with Foley catheter in place to  follow-up with urology as an outpatient As noted above he will complete a 10-day course of antibiotics.   Community-acquired pneumonia: Treated empirically with Rocephin and Zithromax, Antitussives, albuterol as needed, incentive spirometer and as needed oxygen. Antibiotic changed to Bactrim DS.   Paroxysmal A-fib: He went into atrial fibrillation in the setting of sepsis. He is already converted back to the normal sinus rhythm. Eliquis was on hold in the setting of hematuria and for urological procedure. Continue beta-blocker Eliquis resumed. HR remains controlled.   History of dissecting aneurysm repair in 01/2017: CT chest showing enlargement of aortic arch to 4.5 compared to 3.7 in 2018 with no periaortic fluid collection. 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 Imogene Burn was consulted from ED and said stable findings. Follow-up with outpatient as previously scheduled.   Acute kidney injury: > Resolved. Creatinine 1.5, presumed AKI Renal functions improved and back to baseline with fluid resuscitation.   Chronic complete paraplegia: Continue Supportive care.    History of cardiac pacemaker: No acute issues suspected however last interrogated was on 10/2020 at [facility] EP Pacemaker interrogated. 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   Chronic complete paraplegia (HCC)   Hypokalemia   Hypertension   History of cardiac pacemaker   PAD (peripheral artery disease) (HCC)   Neurogenic bladder   Atrophic kidney   Nephrolithiasis   Paroxysmal atrial fibrillation (HCC)   Atrial fibrillation with rapid ventricular response  [facility])   Pacemaker    Discharge instructions    Discharge Instructions     Call MD for:  persistant nausea and vomiting   Complete by: As directed    Discharge instructions   Complete by: As directed    Follow up with your PCP within 7 days to make sure you are continuing to improve.  Follow up with Urology as recommended by them. Your PCP will need to schedule you for cardiology to have your pacemaker checked   Increase activity slowly   Complete by: As directed        Discharge Medications   Allergies as of 03/27/2022   No Known Allergies      Medication List     STOP  taking these medications    hydrochlorothiazide 25 MG tablet Commonly known as: HYDRODIURIL   potassium chloride SA 20 MEQ tablet Commonly known as: KLOR-CON M       TAKE these medications    atorvastatin 40 MG tablet Commonly known as: LIPITOR Take 40 mg by mouth every evening.   budesonide 0.25 MG/2ML nebulizer solution Commonly known as: PULMICORT Take 2 mLs (0.25 mg total) by nebulization every 6 (six) hours.   Eliquis 5 MG Tabs tablet Generic drug: apixaban Take 5 mg by mouth 2 (two) times daily.   ipratropium-albuterol 0.5-2.5 (3) MG/3ML Soln Commonly known as: DUONEB Take 3 mLs by nebulization every 6 (six) hours.   metoprolol tartrate 50 MG tablet Commonly known as: LOPRESSOR Take 50 mg by mouth 2 (two) times daily.   metroNIDAZOLE 500 MG tablet Commonly known as: FLAGYL Take 1 tablet (500 mg total) by mouth every 12 (twelve) hours for 3 days.   sulfamethoxazole-trimethoprim 800-160 MG tablet Commonly known as: BACTRIM DS Take 1 tablet by mouth every 12 (twelve) hours for 3 days.          Major procedures and Radiology Reports - PLEASE review detailed and final reports thoroughly  -       ECHOCARDIOGRAM COMPLETE  Result Date: 03/23/2022    ECHOCARDIOGRAM REPORT   Patient Name:   CHIVAS NOTZ Date of Exam: 03/23/2022 Medical Rec #:  [id number]                Height:       70.0 in Accession #:    [id number]              Weight:       200.0 lb Date of Birth:  27-Aug-1955               BSA:          2.087 m Patient Age:    66 years                BP:           108/73 mmHg Patient Gender: M                       HR:           88 bpm. Exam Location:  [facility] Procedure: 2D Echo, Cardiac Doppler and Color Doppler Indications:     Atrial Fibrillation I48.91  History:         Patient has prior history of Echocardiogram examinations, most                  recent 02/25/2017. Pacemaker, Stroke; Risk                  Factors:Hypertension.  Sonographer:     Cristela Blue Referring Phys:  [id number] CHRISTOP[address] RONALD BERGE Diagnosing Phys: Lorine Bears MD  Sonographer Comments: Technically challenging study due to limited acoustic windows, no apical window and no subcostal window. Image acquisition challenging due to patient body habitus. IMPRESSIONS  1. Left ventricular ejection fraction, by estimation, is 50 to 55%. The left ventricle has low normal function. Left ventricular endocardial border not optimally defined to evaluate regional wall motion. There is mild left ventricular hypertrophy. Left ventricular diastolic function could not be evaluated.  2. Right ventricular systolic function is normal. The right ventricular size is normal. Tricuspid regurgitation signal is inadequate for assessing PA pressure.  3. The mitral valve is normal in  structure. No evidence of mitral valve regurgitation. No evidence of mitral stenosis.  4. The aortic valve is normal in structure. Aortic valve regurgitation is not visualized. No aortic stenosis is present.  5. Technically challenging study due to limited acoustic windows, no apical window and no subcostal window. FINDINGS  Left Ventricle: Left ventricular ejection fraction, by estimation, is 50 to 55%. The left ventricle has low normal function. Left ventricular endocardial border not optimally defined to evaluate regional wall motion. The left  ventricular internal cavity  size was normal in size. There is mild left ventricular hypertrophy. Left ventricular diastolic function could not be evaluated. Right Ventricle: The right ventricular size is normal. No increase in right ventricular wall thickness. Right ventricular systolic function is normal. Tricuspid regurgitation signal is inadequate for assessing PA pressure. Left Atrium: Left atrial size was normal in size. Right Atrium: Right atrial size was normal in size. Pericardium: There is no evidence of pericardial effusion. Mitral Valve: The mitral valve is normal in structure. No evidence of mitral valve regurgitation. No evidence of mitral valve stenosis. Tricuspid Valve: The tricuspid valve is normal in structure. Tricuspid valve regurgitation is not demonstrated. No evidence of tricuspid stenosis. Aortic Valve: The aortic valve is normal in structure. Aortic valve regurgitation is not visualized. No aortic stenosis is present. Pulmonic Valve: The pulmonic valve was normal in structure. Pulmonic valve regurgitation is not visualized. No evidence of pulmonic stenosis. Aorta: The aortic root is normal in size and structure. Venous: The inferior vena cava was not well visualized. IAS/Shunts: No atrial level shunt detected by color flow Doppler.  LEFT VENTRICLE PLAX 2D LVIDd:         4.00 cm LVIDs:         2.90 cm LV PW:         1.00 cm LV IVS:        1.20 cm LVOT diam:     2.00 cm LVOT Area:     3.14 cm  LEFT ATRIUM         Index LA diam:    2.90 cm 1.39 cm/m   AORTA Ao Root diam: 3.20 cm  SHUNTS Systemic Diam: 2.00 cm Lorine BearsMuhammad Arida MD Electronically signed by Lorine BearsMuhammad Arida MD Signature Date/Time: 03/23/2022/1:04:08 PM    Final    CT CHEST ABDOMEN PELVIS W CONTRAST  Result Date: 03/20/2022 CLINICAL DATA:  Sepsis and cough. Reported history of type A aortic dissection on CT dated 02/25/2017. EXAM: CT CHEST, ABDOMEN, AND PELVIS WITH CONTRAST TECHNIQUE: Multidetector CT imaging of the chest, abdomen  and pelvis was performed following the standard protocol during bolus administration of intravenous contrast. RADIATION DOSE REDUCTION: This exam was performed according to the departmental dose-optimization program which includes automated exposure control, adjustment of the mA and/or kV according to patient size and/or use of iterative reconstruction technique. CONTRAST:  100mL OMNIPAQUE IOHEXOL 300 MG/ML  SOLN COMPARISON:  CT dated 02/25/2017. FINDINGS: CT CHEST FINDINGS Cardiovascular: There is no cardiomegaly or pericardial effusion. Implantable pacemaker device. Status post repair of ascending aorta. A dissection flap originates in the aortic arch just distal to the takeoff of the left subclavian artery. 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 Tiny focus of density adjacent to the pseudoaneurysm may represent postsurgical changes. A small amount of fluid is not excluded. The origins of the great vessels of the aortic arch remain patent. The dissection flap extends into the abdominal aorta.  There has been interval re-expansion of the true lumen since the prior CT. There is increase in the diameter of the aortic arch measuring approximately 4.5 cm (previously 3.7 cm. No periaortic fluid collection. The central pulmonary arteries appear patent. Mediastinum/Nodes: No hilar or mediastinal adenopathy. The esophagus is grossly unremarkable. No mediastinal fluid collection. Lungs/Pleura: Background of emphysema. Faint interstitial coarsening in the right middle lobe and right upper lobe, likely chronic. Atypical infiltrate is less likely. There are bibasilar subpleural atelectasis/scarring. There is trace left pleural effusion. No focal consolidation or pneumothorax. The central airways are patent. Musculoskeletal: Median sternotomy wires. No acute osseous pathology. CT ABDOMEN PELVIS FINDINGS No intra-abdominal free air  or free fluid. Hepatobiliary: The liver is unremarkable. No biliary dilatation. The gallbladder is unremarkable. Pancreas: Unremarkable. No pancreatic ductal dilatation or surrounding inflammatory changes. Spleen: Normal in size without focal abnormality. Adrenals/Urinary Tract: The adrenal glands unremarkable. Atrophic right kidney. A cluster of stones in the upper pole of the left kidney measure up to 13 mm. No hydronephrosis. Areas of cortical scarring involving the upper and middle pole of the left kidney. 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 The urinary bladder is collapsed. Multiple large stones noted within the urinary bladder. There is diffuse thickened appearance of the bladder wall which may be related to chronic bladder infection. Correlation with urinalysis recommended to exclude active cystitis. Stomach/Bowel: There is no bowel obstruction or active inflammation. The appendix is normal. Vascular/Lymphatic: Aortic dissection with flap terminating at the aortic bifurcation. The celiac axis arises from the false lumen and appears patent. The SMA appears patent. The dissection flap extends into the origin of the SMA. The left renal artery is patent and arises from the false lumen. The right renal artery appears patent however slightly hypoperfused. The IMA is patent. Common iliac artery stents noted. The stents appear patent. The external iliac arteries are patent. The IVC is unremarkable. No portal venous gas. There is no adenopathy. Reproductive: The prostate and seminal vesicles are grossly unremarkable. Other: None Musculoskeletal: Osteopenia with degenerative changes of the spine. Chronic changes of the left hip. No acute osseous pathology. IMPRESSION: 1. Status post repair of the ascending aorta. A dissection flap originates in the aortic arch just distal to the takeoff of the left subclavian artery and extends  to the abdominal aortic bifurcation. There has been interval re-expansion of the true lumen since the prior CT. There is increase in the diameter of the aortic arch measuring approximately 4.5 cm (previously 3.7 cm). No periaortic fluid collection. 2. 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 Vascular surgery consult is advised. 3. Emphysema.  Trace left pleural effusion. 4. No bowel obstruction. Normal appendix. 5. Multiple large stones within the urinary bladder. Diffuse thickened appearance of the bladder wall may be related to chronic bladder infection. Correlation with urinalysis recommended to exclude active cystitis. Electronically Signed   By: Elgie Collard M.D.   On: 03/20/2022 21:43   DG Chest Port 1 View  Result Date: 03/20/2022 CLINICAL DATA:  Cough. EXAM: PORTABLE CHEST 1 VIEW COMPARISON:  Chest radiograph dated 02/25/2017 FINDINGS: There is mild diffuse interstitial and peribronchial densities primarily involving the right lung with areas of nodularity and new compared to prior radiograph. Findings may represent chronic bronchitic changes but developing atypical infiltrate is not excluded. Clinical correlation is recommended. No focal consolidation, pleural effusion, or pneumothorax. The cardiac silhouette  is within normal limits. There is aneurysmal dilatation of the aortic arch measuring up to 6.1 cm in diameter. There is report of type A dissection on CT dated 02/25/2017. The images of the head CT are not available for comparison. A thin linear lucency to the left of the aortic arch and proximal descending aorta may be artifactual and air within a bronchus. A small pneumomediastinum is not excluded. CT may provide better evaluation. Median sternotomy wires and an implantable device. No acute osseous pathology. IMPRESSION: 1. Possible developing atypical infiltrate in the right lung. 2. 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 Reported type A  dissection on the CT dated 02/25/2017. 3. Possible small pneumomediastinum. CT may provide better evaluation. Electronically Signed   By: Anner Crete M.D.   On: 03/20/2022 19:17    Micro Results    Recent Results (from the past 240 hour(s))  Resp panel by RT-PCR (RSV, Flu A&B, Covid) Anterior Nasal Swab     Status: None   Collection Time: 03/20/22  7:00 PM   Specimen: Anterior Nasal Swab  Result Value Ref Range Status   SARS Coronavirus 2 by RT PCR NEGATIVE NEGATIVE Final    Comment: (NOTE) SARS-CoV-2 target nucleic acids are NOT DETECTED.  The SARS-CoV-2 RNA is generally detectable in upper respiratory specimens during the acute phase of infection. The lowest concentration of SARS-CoV-2 viral copies this assay can detect is 138 copies/mL. A negative result does not preclude SARS-Cov-2 infection and should not be used as the sole basis for treatment or other patient management decisions. A negative result may occur with  improper specimen collection/handling, submission of specimen other than nasopharyngeal swab, presence of viral mutation(s) within the areas targeted by this assay, and inadequate number of viral copies(<138 copies/mL). A negative result must be combined with clinical observations, patient history, and epidemiological information. The expected result is Negative.  Fact Sheet for Patients:  EntrepreneurPulse.com.au  Fact Sheet for Healthcare Providers:  IncredibleEmployment.be  This test is no t yet approved or cleared by the [place] FDA and  has been authorized for detection and/or diagnosis of SARS-CoV-2 by FDA under an Emergency Use Authorization (EUA). This EUA will remain  in effect (meaning this test can be used) for the duration of the COVID-19 declaration under Section 564(b)(1) of the Act, 21 U.S.C.section 360bbb-3(b)(1), unless the authorization is terminated  or revoked sooner.       Influenza A by PCR  NEGATIVE NEGATIVE Final   Influenza B by PCR NEGATIVE NEGATIVE Final    Comment: (NOTE) The Xpert Xpress SARS-CoV-2/FLU/RSV plus assay is intended as an aid in the diagnosis of influenza from Nasopharyngeal swab specimens and should not be used as a sole basis for treatment. Nasal washings and aspirates are unacceptable for Xpert Xpress SARS-CoV-2/FLU/RSV testing.  Fact Sheet for Patients: EntrepreneurPulse.com.au  Fact Sheet for Healthcare Providers: IncredibleEmployment.be  This test is not yet approved or cleared by the [place] FDA and has been authorized for detection and/or diagnosis of SARS-CoV-2 by FDA under an Emergency Use Authorization (EUA). This EUA will remain in effect (meaning this test can be used) for the duration of the COVID-19 declaration under Section 564(b)(1) of the Act, 21 U.S.C. section 360bbb-3(b)(1), unless the authorization is terminated or revoked.     Resp Syncytial Virus by PCR NEGATIVE NEGATIVE Final    Comment: (NOTE) Fact Sheet for Patients: EntrepreneurPulse.com.au  Fact Sheet for Healthcare Providers: IncredibleEmployment.be  This test is not yet approved or cleared  by the [place] and has been authorized for detection and/or diagnosis of SARS-CoV-2 by FDA under an Emergency Use Authorization (EUA). This EUA will remain in effect (meaning this test can be used) for the duration of the COVID-19 declaration under Section 564(b)(1) of the Act, 21 U.S.C. section 360bbb-3(b)(1), unless the authorization is terminated or revoked.  Performed at [facility], [address], [place], [place] [address]   Urine Culture     Status: Abnormal   Collection Time: 03/20/22  8:39 PM   Specimen: In/Out Cath Urine  Result Value Ref Range Status   Specimen Description   Final    IN/OUT CATH URINE Performed at [facility], [address],  [place], [place] [address]    Special Requests   Final    NONE Performed at [facility], [address] [place], [place] [address]    Culture MULTIPLE SPECIES PRESENT, SUGGEST RECOLLECTION (A)  Final   Report Status 03/22/2022 FINAL  Final  Blood Culture (routine x 2)     Status: Abnormal   Collection Time: 03/20/22  8:44 PM   Specimen: BLOOD  Result Value Ref Range Status   Specimen Description   Final    BLOOD BLOOD RIGHT FOREARM Performed at [facility], [address] [place], [place] [address]    Special Requests   Final    BOTTLES DRAWN AEROBIC AND ANAEROBIC Blood Culture adequate volume Performed at [facility], [address] [place], [place] [address]    Culture  Setup Time (A)  Final    GRAM VARIABLE ROD ANAEROBIC BOTTLE ONLY CRITICAL RESULT CALLED TO, READ BACK BY AND VERIFIED WITH: JUSTIN MILLER 03/21/22 1636 AMK    Culture (A)  Final    CLOSTRIDIUM RAMOSUM PROVIDENCIA STUARTII Standardized susceptibility testing for this organism is not available. FOR CLOSTRIDIUM RAMOSUM Performed at [facility], [place] [address], [place],  [address]    Report Status 03/24/2022 FINAL  Final   Organism ID, Bacteria PROVIDENCIA STUARTII  Final      Susceptibility   Providencia stuartii - MIC*    AMPICILLIN RESISTANT Resistant     CEFAZOLIN >=64 RESISTANT Resistant     CEFEPIME <=0.12 SENSITIVE Sensitive     CEFTAZIDIME <=1 SENSITIVE Sensitive     CEFTRIAXONE <=0.25 SENSITIVE Sensitive     CIPROFLOXACIN <=0.25 SENSITIVE Sensitive     GENTAMICIN RESISTANT Resistant     IMIPENEM 2 SENSITIVE Sensitive     TRIMETH/SULFA <=20 SENSITIVE Sensitive     AMPICILLIN/SULBACTAM 16 INTERMEDIATE Intermediate     PIP/TAZO <=4 SENSITIVE Sensitive     * PROVIDENCIA STUARTII  Blood Culture ID Panel (Reflexed)     Status: None   Collection Time: 03/20/22  8:44 PM  Result Value Ref Range Status   Enterococcus faecalis NOT DETECTED NOT DETECTED Final    Enterococcus Faecium NOT DETECTED NOT DETECTED Final   Listeria monocytogenes NOT DETECTED NOT DETECTED Final   Staphylococcus species NOT DETECTED NOT DETECTED Final   Staphylococcus aureus (BCID) NOT DETECTED NOT DETECTED Final   Staphylococcus epidermidis NOT DETECTED NOT DETECTED Final   Staphylococcus lugdunensis NOT DETECTED NOT DETECTED Final   Streptococcus species NOT DETECTED NOT DETECTED Final   Streptococcus agalactiae NOT DETECTED NOT DETECTED Final   Streptococcus pneumoniae NOT DETECTED NOT DETECTED Final   Streptococcus pyogenes NOT DETECTED NOT DETECTED Final   A.calcoaceticus-baumannii NOT DETECTED NOT DETECTED Final   Bacteroides fragilis NOT DETECTED NOT DETECTED Final  Enterobacterales NOT DETECTED NOT DETECTED Final   Enterobacter cloacae complex NOT DETECTED NOT DETECTED Final   Escherichia coli NOT DETECTED NOT DETECTED Final   Klebsiella aerogenes NOT DETECTED NOT DETECTED Final   Klebsiella oxytoca NOT DETECTED NOT DETECTED Final   Klebsiella pneumoniae NOT DETECTED NOT DETECTED Final   Proteus species NOT DETECTED NOT DETECTED Final   Salmonella species NOT DETECTED NOT DETECTED Final   Serratia marcescens NOT DETECTED NOT DETECTED Final   Haemophilus influenzae NOT DETECTED NOT DETECTED Final   Neisseria meningitidis NOT DETECTED NOT DETECTED Final   Pseudomonas aeruginosa NOT DETECTED NOT DETECTED Final   Stenotrophomonas maltophilia NOT DETECTED NOT DETECTED Final   Candida albicans NOT DETECTED NOT DETECTED Final   Candida auris NOT DETECTED NOT DETECTED Final   Candida glabrata NOT DETECTED NOT DETECTED Final   Candida krusei NOT DETECTED NOT DETECTED Final   Candida parapsilosis NOT DETECTED NOT DETECTED Final   Candida tropicalis NOT DETECTED NOT DETECTED Final   Cryptococcus neoformans/gattii NOT DETECTED NOT DETECTED Final    Comment: Performed at [facility], [address] [place], [place] [address]    Today   Subjective     Pheng Prokop feels much improved since admission.  Feels ready to go home.  Denies chest pain, shortness of breath or abdominal pain.  Feels they can take care of themselves with the resources they have at home.  Objective   Blood pressure 128/80, pulse 93, temperature 98.1 F (36.7 C), resp. rate 17, height 5\' 10"  (1.778 m), weight 90.7 kg, SpO2 98 %.   Intake/Output Summary (Last 24 hours) at 03/27/2022 1244 Last data filed at 03/27/2022 0407 Gross per 24 hour  Intake --  Output 650 ml  Net -650 ml    Exam General: Chronically ill-appearing patient sitting up in bed in NAD Eyes: sclera anicteric, conjuctiva mild injection bilaterally CVS: S1-S2, regular  Respiratory:  decreased air entry bilaterally secondary to decreased inspiratory effort, rales at bases  GI: NABS, soft, NT  LE: No edema.    Data Review   CBC w Diff:  Lab Results  Component Value Date   WBC 12.9 (H) 03/27/2022   HGB 10.2 (L) 03/27/2022   HGB 13.6 02/17/2017   HCT 32.5 (L) 03/27/2022   HCT 40.7 02/17/2017   PLT 398 03/27/2022   PLT 246 02/17/2017   LYMPHOPCT 3 03/20/2022   MONOPCT 3 03/20/2022   EOSPCT 0 03/20/2022   BASOPCT 1 03/20/2022    CMP:  Lab Results  Component Value Date   NA 131 (L) 03/27/2022   NA 141 02/17/2017   K 3.7 03/27/2022   CL 104 03/27/2022   CO2 19 (L) 03/27/2022   BUN 7 (L) 03/27/2022   BUN 12 02/17/2017   CREATININE 1.17 03/27/2022   PROT 7.6 03/20/2022   PROT 6.7 10/19/2016   ALBUMIN 3.0 (L) 03/20/2022   ALBUMIN 4.2 10/19/2016   BILITOT 2.3 (H) 03/20/2022   BILITOT 0.4 10/19/2016   ALKPHOS 129 (H) 03/20/2022   AST 33 03/20/2022   ALT 12 03/20/2022  .   Total Time in preparing paper work, data evaluation and todays exam - 35 minutes  03/22/2022 M.D on 03/27/2022 at 12:44 PM  [facility]

## 2022-03-27 NOTE — TOC Progression Note (Addendum)
Transition of Care [facility]) - Progression Note    Patient Details  Name: Savaughn Karwowski MRN: [id number] Date of Birth: 02-17-1956  Transition of Care [facility]) CM/SW Contact  Izola Price, RN Phone Number: 03/27/2022, 9:26 AM  Clinical Narrative: 1/27: Chart reviewed for any prior [facility] notes/consults/barriers for discharge per provider request. Noted that patient came from home to ED with weakness and SOC. Paraplegic and bedridden per ED notes. Treated for UTI and pneumonia. Vascular consult for noted presence of "increased aneurysmal dilatation compared to 2018" per provider notes. Urology consult ordered and foley to be left in place till follow up appointment. Pacemaker was  "interrogated" per notes and per cardiology consult notes, patient will need to be seen in EP device clinic. Sister has a handicap Lucianne Lei she transports patient with. No TOC consult evident in orders for this admission. May have transportation needs depending on sister's ability/patient ability to get into [place]. Simmie Davies RN CM   PCP: Donnie Coffin, MD Cardiologist: Wellington Hampshire, MD [place]:  [place] [[phone]]  [place], [place] - [place] [[id number]]       Expected Discharge Plan and Services                                               Social Determinants of Health (SDOH) Interventions SDOH Screenings   Tobacco Use: High Risk (03/22/2022)    Readmission Risk Interventions     No data to display

## 2022-03-27 NOTE — Progress Notes (Signed)
Pt is discharging home. DC education completed, brother at bedside.

## 2022-04-07 ENCOUNTER — Telehealth: Payer: Self-pay | Admitting: Nurse Practitioner

## 2022-04-07 NOTE — Telephone Encounter (Signed)
-----   Message from Theora Gianotti, NP sent at 03/24/2022  1:50 PM EST ----- Syliva Overman,  This guy will prob go home in the next few days.  We just signed off today.  He needs APP f/u in the next 2-4 wks and also needs to establish w EP b/c he has a PPM that hasn't been looked at in years (prev followed @ [facility]).  He's paraplegic and bedridden, so you may have to drive to his home to get him and bring him in.  Thanks,  Gerald Stabs

## 2022-04-07 NOTE — Telephone Encounter (Signed)
LVM TO SCHEDULE

## 2022-04-14 ENCOUNTER — Ambulatory Visit: Payer: Medicaid Other | Admitting: Urology

## 2022-04-27 ENCOUNTER — Ambulatory Visit: Payer: Medicaid Other | Attending: Cardiology | Admitting: Cardiology

## 2022-04-27 ENCOUNTER — Encounter: Payer: Self-pay | Admitting: Cardiology

## 2022-04-27 ENCOUNTER — Other Ambulatory Visit
Admission: RE | Admit: 2022-04-27 | Discharge: 2022-04-27 | Disposition: A | Discharge status: Home / Self Care | Payer: Medicaid Other | Source: Ambulatory Visit | Attending: Cardiology | Admitting: Cardiology

## 2022-04-27 VITALS — BP 141/107 | HR 83 | Ht 70.0 in | Wt 171.0 lb

## 2022-04-27 DIAGNOSIS — I739 Peripheral vascular disease, unspecified: Secondary | ICD-10-CM | POA: Diagnosis not present

## 2022-04-27 DIAGNOSIS — I480 Paroxysmal atrial fibrillation: Secondary | ICD-10-CM | POA: Diagnosis present

## 2022-04-27 DIAGNOSIS — N179 Acute kidney failure, unspecified: Secondary | ICD-10-CM | POA: Diagnosis present

## 2022-04-27 DIAGNOSIS — E876 Hypokalemia: Secondary | ICD-10-CM | POA: Diagnosis present

## 2022-04-27 DIAGNOSIS — Q2112 Patent foramen ovale: Secondary | ICD-10-CM | POA: Diagnosis present

## 2022-04-27 DIAGNOSIS — I10 Essential (primary) hypertension: Secondary | ICD-10-CM

## 2022-04-27 DIAGNOSIS — I5020 Unspecified systolic (congestive) heart failure: Secondary | ICD-10-CM

## 2022-04-27 DIAGNOSIS — G8221 Paraplegia, complete: Secondary | ICD-10-CM

## 2022-04-27 DIAGNOSIS — I5022 Chronic systolic (congestive) heart failure: Secondary | ICD-10-CM

## 2022-04-27 DIAGNOSIS — E785 Hyperlipidemia, unspecified: Secondary | ICD-10-CM

## 2022-04-27 DIAGNOSIS — I7101 Dissection of ascending aorta: Secondary | ICD-10-CM

## 2022-04-27 DIAGNOSIS — Z950 Presence of cardiac pacemaker: Secondary | ICD-10-CM

## 2022-04-27 LAB — CBC
HCT: 43.7 % (ref 39.0–52.0)
Hemoglobin: 13.2 g/dL (ref 13.0–17.0)
MCH: 29.7 pg (ref 26.0–34.0)
MCHC: 30.2 g/dL (ref 30.0–36.0)
MCV: 98.2 fL (ref 80.0–100.0)
Platelets: 353 10*3/uL (ref 150–400)
RBC: 4.45 MIL/uL (ref 4.22–5.81)
RDW: 16.1 % — ABNORMAL HIGH (ref 11.5–15.5)
WBC: 8 10*3/uL (ref 4.0–10.5)
nRBC: 0 % (ref 0.0–0.2)

## 2022-04-27 LAB — BASIC METABOLIC PANEL
Anion gap: 13 (ref 5–15)
BUN: 17 mg/dL (ref 8–23)
CO2: 22 mmol/L (ref 22–32)
Calcium: 8.8 mg/dL — ABNORMAL LOW (ref 8.9–10.3)
Chloride: 101 mmol/L (ref 98–111)
Creatinine, Ser: 1.22 mg/dL (ref 0.61–1.24)
GFR, Estimated: >60 mL/min (ref >60)
Glucose, Bld: 94 mg/dL (ref 70–99)
Potassium: 3.7 mmol/L (ref 3.5–5.1)
Sodium: 136 mmol/L (ref 135–145)

## 2022-04-27 NOTE — Progress Notes (Unsigned)
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  Past Medical History    Past Medical History:  Diagnosis Date   Ascending aortic dissection ([place])    a. 01/2017 s/p emergent repair @ [facility].   Cardiac pacemaker in situ    a. 03/2017 s/p MDT [place] (ser# [id number]).   Chronic HFrEF (heart failure with reduced ejection fraction) ([place])    a. 06/2016 Echo: EF 45%, diff HK, GrI DD; b. 03/2017 Echo: EF 55-60%, apical wma, Gr2 DD; c. 06/2017 Echo: EF 55-60%, apical, apical inf, and mid antsept wma; d. 07/2018 Echo: EF 45%, apical thinning & AK. Dil Asc Ao.   Essential hypertension    LV (left ventricular) mural thrombus    a. 06/2016 Echo: EF 45%, diff HK w/ thrombus (0.9 x 0.9 x.0.7cm), GrI DD, mild AI/MR.   Mixed hyperlipidemia    PAD (peripheral artery disease) (HCC)    PAF (paroxysmal atrial fibrillation) (HCC)    a. CHA2DS2VASc = 5-->chronic eliquis.   Paraplegia ([place])    a.  Spinal cord ischemia and injury in the setting of aortic dissection in December 2018.   Stroke [facility]) 06/2016   Urinary incontinence    a. in setting of paraplegia - self catheterizes.   Past Surgical History:  Procedure Laterality Date   DIALYSIS/PERMA CATHETER INSERTION N/A 02/25/2017   Procedure: DIALYSIS/PERMA CATHETER INSERTION;  Surgeon: Algernon Huxley, MD;  Location: [place] CV LAB;  Service: Cardiovascular;  Laterality: N/A;   EMBOLECTOMY Right 02/23/2017   Procedure:  EMBOLECTOMY;  Surgeon: Katha Cabal, MD;  Location: [facility] ORS;  Service: Vascular;  Laterality: Right;   FASCIECTOMY Right 02/23/2017   Procedure: FASCIECTOMY;  Surgeon: Katha Cabal, MD;  Location: [facility] ORS;  Service: Vascular;  Laterality: Right;   HERNIA REPAIR      Allergies  No Known Allergies  History of Present Illness    Mitchell Dixon is a 67 year old male with previously mentioned complex past medical history.  Back in 2018 he was admitted to [facility] with hypertensive urgency complaint of recurrent falls and right leg weakness.  He was found to have an acute stroke on MRI of the brain.  Echocardiogram revealed LVEF of 45% with diffuse hypokinesis and LV thrombus and he was treated with apixaban.  In December 2018 he was admitted to [facility] with a several day history of acute onset of abdominal lower extremity pain.  CTA revealed occlusion of the right common iliac artery and complete absence of flow to the entire right leg.  He went to the ER for urgent evaluation of the right lower extremity with an embolectomy and stenting of the right and left common iliac arteries, left external iliac artery, and an open cutdown of the right common femoral artery, and 4 compartment fasciotomies to the right calf.  Unfortunately he experienced paraplegia. Total loss of sensation to the bilateral lower extremities.  CT imaging revealed type a aortic dissection extending into the aortic bifurcation.  He was  transferred to [facility] where he subsequently underwent repair with prolonged hospitalization for approximately 3 months.  During that time he required the placement of a Medtronic Micra RV lead permanent pacemaker.  He also was found to be in paroxysmal atrial fibrillation.  Most recent echo in June 2020 showed an EF of 45% with apical thinning and akinesis.  He had not been seen by [facility] cardiology in several years.  He presented to the [facility] emergency department 03/20/2022 after he had been  experiencing frequent episodes of lightheadedness and syncope with sitting up.  He stated he was in his usual state of health until approximately 1 week ago when he started experience generalized malaise, cough, weakness, and poor oral intake.  In the emergency department he was found to be in sinus tachycardia with a rate of 92 without acute ST or T wave changes.  White count was elevated at 26,000 with a lactic acid of 2.3.  Respiratory panel was negative for COVID, flu and RSV.  CT of the chest abdomen pelvis showed dissection flap portion of the aortic arch just distal to the takeoff of the left subclavian artery extending to the abdominal aortic bifurcation with interval reexpansion of the true lumen.  Aortic arch measuring 4.5 cm.  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  He was admitted and treated for sepsis, UTI, community-acquired pneumonia.  He was seen by vascular surgery and urology.  He was treated with IV fluids and antibiotic therapy and was subsequently decided he was safe for discharge on 03/27/2022.  He returns to clinic today stating that he has been doing OK. He has not noticed any blood in his urine or stool and has not missed any doses of his Eliquis. Blood pressure is up today as when he arrived at the hospital for his visit [facility] and had to come all the way to the office. He denies any chest pain or shortness of breath, palpitations, or peripheral edema. He also is aware of his follow up appointment that has been scheduled with EP.   Home Medications    Current Outpatient Medications  Medication Sig Dispense Refill   atorvastatin (LIPITOR) 40 MG tablet Take 40 mg by mouth every evening.     ELIQUIS 5 MG TABS tablet Take 5 mg by mouth 2 (two) times daily.     metoprolol tartrate (LOPRESSOR) 50 MG tablet Take 50 mg by mouth 2 (two) times daily.     budesonide (PULMICORT) 0.25 MG/2ML nebulizer solution  Take 2 mLs (0.25 mg total) by nebulization every 6 (six) hours. (Patient not taking: Reported on 03/21/2022) 60 mL 12   ipratropium-albuterol (DUONEB) 0.5-2.5 (3) MG/3ML SOLN Take 3 mLs by nebulization every 6 (six) hours. (Patient not taking: Reported on 03/21/2022) 360 mL    No current facility-administered medications for this visit.     Family History    Family History  Problem Relation Age of Onset   Dementia Mother    Hypertension Mother    Pneumonia Father    Hypertension Sister    He indicated that his mother is deceased. He indicated that his father is deceased. He indicated that his sister is alive.  Social History    Social History   Socioeconomic History   Marital status: Single    Spouse name: Not on file   Number of children: Not on file   Years of education: Not on file   Highest education  level: Not on file  Occupational History   Not on file  Tobacco Use   Smoking status: Every Day    Packs/day: 0.25    Years: 30.00    Total pack years: 7.50    Types: Cigarettes   Smokeless tobacco: Never  Substance and Sexual Activity   Alcohol use: No   Drug use: No   Sexual activity: Never  Other Topics Concern   Not on file  Social History Narrative   Lives locally.  Mostly bedridden in setting of hypotension and presyncope/syncope when sitting upright.   Social Determinants of Health   Financial Resource Strain: Not on file  Food Insecurity: Not on file  Transportation Needs: Not on file  Physical Activity: Not on file  Stress: Not on file  Social Connections: Not on file  Intimate Partner Violence: Not on file     Review of Systems    General:  No chills, fever, night sweats or weight changes. Endorses chronic fatigue Cardiovascular:  No chest pain, dyspnea on exertion, edema, orthopnea, endorses occasional palpitations, paroxysmal nocturnal dyspnea. Dermatological: No rash, lesions/masses Respiratory: No cough, dyspnea Urologic: No hematuria,  dysuria Abdominal:   No nausea, vomiting, diarrhea, bright red blood per rectum, melena, or hematemesis Neurologic:  No visual changes, wkns, changes in mental status. Endorses generalized weakness All other systems reviewed and are otherwise negative except as noted above.   Physical Exam    VS:  BP (!) 141/107 (BP Location: Left Arm, Patient Position: Sitting, Cuff Size: Normal)   Pulse 83   Ht '5\' 10"'$  (1.778 m)   Wt 171 lb (77.6 kg)   SpO2 99%   BMI 24.54 kg/m  , BMI Body mass index is 24.54 kg/m.     Vitals:   04/27/22 0300 04/27/22 1446  BP: (!) 144/102 (!) 141/107    GEN: In no acute distress. In his wheelchair today HEENT: normal. Neck: Supple, no JVD, carotid bruits, or masses. Cardiac: RRR, no murmurs, rubs, or gallops. No clubbing, cyanosis, 1+ edema.  Radials 2+/PT 2+ and equal bilaterally.  Respiratory:  Respirations regular and unlabored, clear with slightly diminished bases to auscultation bilaterally. GI: Soft, nontender, nondistended, BS + x 4. MS: no deformity or atrophy. Skin: warm and dry, no rash. Neuro:  Paraplegic, moves upper extremities  Psych: Normal affect.  Accessory Clinical Findings    ECG personally reviewed by me today- sinus rate of 83 with ST depression and TWI in diffuse leads - No acute changes  Lab Results  Component Value Date   WBC 8.0 04/27/2022   HGB 13.2 04/27/2022   HCT 43.7 04/27/2022   MCV 98.2 04/27/2022   PLT 353 04/27/2022   Lab Results  Component Value Date   CREATININE 1.22 04/27/2022   BUN 17 04/27/2022   NA 136 04/27/2022   K 3.7 04/27/2022   CL 101 04/27/2022   CO2 22 04/27/2022   Lab Results  Component Value Date   ALT 12 03/20/2022   AST 33 03/20/2022   ALKPHOS 129 (H) 03/20/2022   BILITOT 2.3 (H) 03/20/2022   Lab Results  Component Value Date   CHOL 114 07/20/2016   HDL 28 (L) 07/20/2016   LDLCALC 67 07/20/2016   TRIG 95 07/20/2016   CHOLHDL 4.1 07/20/2016    Lab Results  Component Value Date    HGBA1C 5.7 (H) 07/20/2016    Assessment & Plan   1.  Paroxysmal atrial fibrillation with patient in sinus rate of 83 today on  EKG. Denies any recurrent episodes of a-fib RVR. Has been continued on metoprolol 50 mg twice daily and apixaban 5 mg twice daily for a chads2-Vasc score of at least 5. No issues with bleeding  per the patient (he has to completed self caths) had issues with hematuria in the hospital and has outpatient follow up with Urology. He is being sent for a follow up CBC today since he continues on anticoagulation with bleeding noted during recent hospitalization.  2. Essential hypertension with blood pressure today 141/107 and 144/102. Patient has a history of orthostatic hypotension and has remained stable on metoprolol tartrate 50 mg twice daily. With rushing to get to his appointment and getting lost within the facility blood pressure is up today. No changes made to his current medication regimen. Encouraged to monitor pressure at home and during appointments. Will adjust medication dosage if he continues to remain elevated.   3. Hyperlipidemia with last LDL 67. He has been continued on atorvastatin 40 mg daily.  4. History of type a aortic dissections s/p repair at [facility] with CTA this admission. Evaluated by Vascular surgery and thought findings were stable with recommendation for outpatient follow-up with vascular surgery.  5. Paraplegia following spinal cord ischemia and injury in late 2018. He is becoming more bedridden and has longstanding history of lightheadedness and dizziness.   6. Permanent pacemaker in situ. Medtronic Micra inserted at [facility] in 2018 following aortic dissection surgery. Device interrogated during recent hospitalization. EP follow up scheduled.   7. Chronic heart failure with last LVEF 50-55% and no valvular abnormalities noted on 03/23/22. Patient denies any shortness of breath but continues to have some edema to the feet but overall does not appear to be  volume up.   8. Recent hospitalization with AKI and noted serum creatinine elevated to 1.5. BMP ordered today to reevaluate electrolyte and kidney function.   9. Disposition patient to return to clinic to see MD/APP in 3 months or sooner if needed  Jovin Fester, NP 04/28/2022, 6:25 PM

## 2022-04-27 NOTE — Patient Instructions (Signed)
Medication Instructions:  No changes at this time.   *If you need a refill on your cardiac medications before your next appointment, please call your pharmacy*   Lab Work: CBC & BMP over at the [facility] entrance and stop at registration desk.   If you have labs (blood work) drawn today and your tests are completely normal, you will receive your results only by: [place] (if you have MyChart) OR A paper copy in the mail If you have any lab test that is abnormal or we need to change your treatment, we will call you to review the results.   Testing/Procedures: None   Follow-Up: At [facility], you and your health needs are our priority.  As part of our continuing mission to provide you with exceptional heart care, we have created designated Provider Care Teams.  These Care Teams include your primary Cardiologist (physician) and Advanced Practice Providers (APPs -  Physician Assistants and Nurse Practitioners) who all work together to provide you with the care you need, when you need it.  We recommend signing up for the patient portal called "MyChart".  Sign up information is provided on this After Visit Summary.  MyChart is used to connect with patients for Virtual Visits (Telemedicine).  Patients are able to view lab/test results, encounter notes, upcoming appointments, etc.  Non-urgent messages can be sent to your provider as well.   To learn more about what you can do with MyChart, go to [email or web address].    Your next appointment:   3 month(s)  Provider:   Kathlyn Sacramento, MD or Gerrie Nordmann, NP

## 2022-04-28 ENCOUNTER — Encounter: Payer: Self-pay | Admitting: Cardiology

## 2022-04-28 NOTE — Progress Notes (Signed)
Potassium and kidney function is stable. No need to restart potassium supplements at this time. Blood counts are stable. No changes needed at this time to current medication regimen.

## 2022-05-18 ENCOUNTER — Other Ambulatory Visit: Payer: Self-pay

## 2022-05-18 ENCOUNTER — Emergency Department: Payer: Medicaid Other

## 2022-05-18 ENCOUNTER — Inpatient Hospital Stay
Admission: EM | Admit: 2022-05-18 | Discharge: 2022-05-31 | DRG: 698 | Disposition: E | Payer: Medicaid Other | Attending: Obstetrics and Gynecology | Admitting: Obstetrics and Gynecology

## 2022-05-18 DIAGNOSIS — T83511A Infection and inflammatory reaction due to indwelling urethral catheter, initial encounter: Principal | ICD-10-CM | POA: Diagnosis present

## 2022-05-18 DIAGNOSIS — R319 Hematuria, unspecified: Secondary | ICD-10-CM | POA: Diagnosis not present

## 2022-05-18 DIAGNOSIS — N320 Bladder-neck obstruction: Secondary | ICD-10-CM | POA: Diagnosis present

## 2022-05-18 DIAGNOSIS — I5042 Chronic combined systolic (congestive) and diastolic (congestive) heart failure: Secondary | ICD-10-CM | POA: Diagnosis present

## 2022-05-18 DIAGNOSIS — R6521 Severe sepsis with septic shock: Secondary | ICD-10-CM | POA: Diagnosis not present

## 2022-05-18 DIAGNOSIS — E874 Mixed disorder of acid-base balance: Secondary | ICD-10-CM | POA: Diagnosis not present

## 2022-05-18 DIAGNOSIS — R7989 Other specified abnormal findings of blood chemistry: Secondary | ICD-10-CM

## 2022-05-18 DIAGNOSIS — Z79899 Other long term (current) drug therapy: Secondary | ICD-10-CM

## 2022-05-18 DIAGNOSIS — G8221 Paraplegia, complete: Secondary | ICD-10-CM | POA: Diagnosis present

## 2022-05-18 DIAGNOSIS — Y731 Therapeutic (nonsurgical) and rehabilitative gastroenterology and urology devices associated with adverse incidents: Secondary | ICD-10-CM | POA: Diagnosis present

## 2022-05-18 DIAGNOSIS — N390 Urinary tract infection, site not specified: Principal | ICD-10-CM | POA: Diagnosis present

## 2022-05-18 DIAGNOSIS — I739 Peripheral vascular disease, unspecified: Secondary | ICD-10-CM | POA: Diagnosis present

## 2022-05-18 DIAGNOSIS — R7881 Bacteremia: Secondary | ICD-10-CM

## 2022-05-18 DIAGNOSIS — E785 Hyperlipidemia, unspecified: Secondary | ICD-10-CM | POA: Diagnosis present

## 2022-05-18 DIAGNOSIS — E871 Hypo-osmolality and hyponatremia: Secondary | ICD-10-CM | POA: Diagnosis present

## 2022-05-18 DIAGNOSIS — R4182 Altered mental status, unspecified: Secondary | ICD-10-CM | POA: Diagnosis not present

## 2022-05-18 DIAGNOSIS — N4889 Other specified disorders of penis: Secondary | ICD-10-CM | POA: Diagnosis not present

## 2022-05-18 DIAGNOSIS — N210 Calculus in bladder: Secondary | ICD-10-CM | POA: Diagnosis present

## 2022-05-18 DIAGNOSIS — Z66 Do not resuscitate: Secondary | ICD-10-CM | POA: Diagnosis not present

## 2022-05-18 DIAGNOSIS — I480 Paroxysmal atrial fibrillation: Secondary | ICD-10-CM | POA: Diagnosis present

## 2022-05-18 DIAGNOSIS — N170 Acute kidney failure with tubular necrosis: Secondary | ICD-10-CM | POA: Diagnosis present

## 2022-05-18 DIAGNOSIS — G8220 Paraplegia, unspecified: Secondary | ICD-10-CM | POA: Diagnosis present

## 2022-05-18 DIAGNOSIS — G928 Other toxic encephalopathy: Secondary | ICD-10-CM | POA: Diagnosis not present

## 2022-05-18 DIAGNOSIS — N319 Neuromuscular dysfunction of bladder, unspecified: Secondary | ICD-10-CM | POA: Diagnosis present

## 2022-05-18 DIAGNOSIS — N139 Obstructive and reflux uropathy, unspecified: Secondary | ICD-10-CM | POA: Diagnosis present

## 2022-05-18 DIAGNOSIS — N179 Acute kidney failure, unspecified: Secondary | ICD-10-CM | POA: Diagnosis present

## 2022-05-18 DIAGNOSIS — A4150 Gram-negative sepsis, unspecified: Secondary | ICD-10-CM | POA: Diagnosis present

## 2022-05-18 DIAGNOSIS — A419 Sepsis, unspecified organism: Secondary | ICD-10-CM | POA: Diagnosis not present

## 2022-05-18 DIAGNOSIS — E876 Hypokalemia: Secondary | ICD-10-CM | POA: Diagnosis present

## 2022-05-18 DIAGNOSIS — Z818 Family history of other mental and behavioral disorders: Secondary | ICD-10-CM

## 2022-05-18 DIAGNOSIS — Z7951 Long term (current) use of inhaled steroids: Secondary | ICD-10-CM

## 2022-05-18 DIAGNOSIS — R600 Localized edema: Secondary | ICD-10-CM | POA: Diagnosis not present

## 2022-05-18 DIAGNOSIS — B961 Klebsiella pneumoniae [K. pneumoniae] as the cause of diseases classified elsewhere: Secondary | ICD-10-CM | POA: Diagnosis present

## 2022-05-18 DIAGNOSIS — T148XXS Other injury of unspecified body region, sequela: Secondary | ICD-10-CM

## 2022-05-18 DIAGNOSIS — I2489 Other forms of acute ischemic heart disease: Secondary | ICD-10-CM | POA: Diagnosis present

## 2022-05-18 DIAGNOSIS — R0609 Other forms of dyspnea: Secondary | ICD-10-CM | POA: Diagnosis not present

## 2022-05-18 DIAGNOSIS — J9601 Acute respiratory failure with hypoxia: Secondary | ICD-10-CM | POA: Diagnosis not present

## 2022-05-18 DIAGNOSIS — R6520 Severe sepsis without septic shock: Secondary | ICD-10-CM

## 2022-05-18 DIAGNOSIS — I67850 Cerebral autosomal dominant arteriopathy with subcortical infarcts and leukoencephalopathy: Secondary | ICD-10-CM | POA: Diagnosis not present

## 2022-05-18 DIAGNOSIS — N261 Atrophy of kidney (terminal): Secondary | ICD-10-CM | POA: Diagnosis present

## 2022-05-18 DIAGNOSIS — R32 Unspecified urinary incontinence: Secondary | ICD-10-CM | POA: Diagnosis present

## 2022-05-18 DIAGNOSIS — E878 Other disorders of electrolyte and fluid balance, not elsewhere classified: Secondary | ICD-10-CM | POA: Diagnosis present

## 2022-05-18 DIAGNOSIS — R338 Other retention of urine: Secondary | ICD-10-CM | POA: Diagnosis present

## 2022-05-18 DIAGNOSIS — I4891 Unspecified atrial fibrillation: Secondary | ICD-10-CM | POA: Diagnosis not present

## 2022-05-18 DIAGNOSIS — Z515 Encounter for palliative care: Secondary | ICD-10-CM

## 2022-05-18 DIAGNOSIS — E782 Mixed hyperlipidemia: Secondary | ICD-10-CM | POA: Diagnosis present

## 2022-05-18 DIAGNOSIS — Z8249 Family history of ischemic heart disease and other diseases of the circulatory system: Secondary | ICD-10-CM

## 2022-05-18 DIAGNOSIS — R339 Retention of urine, unspecified: Secondary | ICD-10-CM | POA: Diagnosis present

## 2022-05-18 DIAGNOSIS — R34 Anuria and oliguria: Secondary | ICD-10-CM | POA: Diagnosis not present

## 2022-05-18 DIAGNOSIS — Z7901 Long term (current) use of anticoagulants: Secondary | ICD-10-CM

## 2022-05-18 DIAGNOSIS — A4159 Other Gram-negative sepsis: Secondary | ICD-10-CM | POA: Diagnosis present

## 2022-05-18 DIAGNOSIS — R54 Age-related physical debility: Secondary | ICD-10-CM | POA: Diagnosis present

## 2022-05-18 DIAGNOSIS — R310 Gross hematuria: Secondary | ICD-10-CM | POA: Diagnosis not present

## 2022-05-18 DIAGNOSIS — N200 Calculus of kidney: Secondary | ICD-10-CM | POA: Diagnosis present

## 2022-05-18 DIAGNOSIS — N5089 Other specified disorders of the male genital organs: Secondary | ICD-10-CM | POA: Diagnosis not present

## 2022-05-18 DIAGNOSIS — I110 Hypertensive heart disease with heart failure: Secondary | ICD-10-CM | POA: Diagnosis present

## 2022-05-18 DIAGNOSIS — E872 Acidosis, unspecified: Secondary | ICD-10-CM | POA: Diagnosis present

## 2022-05-18 DIAGNOSIS — J9602 Acute respiratory failure with hypercapnia: Secondary | ICD-10-CM | POA: Diagnosis not present

## 2022-05-18 DIAGNOSIS — I255 Ischemic cardiomyopathy: Secondary | ICD-10-CM | POA: Diagnosis present

## 2022-05-18 DIAGNOSIS — Z950 Presence of cardiac pacemaker: Secondary | ICD-10-CM

## 2022-05-18 DIAGNOSIS — B9689 Other specified bacterial agents as the cause of diseases classified elsewhere: Secondary | ICD-10-CM | POA: Diagnosis not present

## 2022-05-18 DIAGNOSIS — F17210 Nicotine dependence, cigarettes, uncomplicated: Secondary | ICD-10-CM | POA: Diagnosis present

## 2022-05-18 DIAGNOSIS — I10 Essential (primary) hypertension: Secondary | ICD-10-CM | POA: Diagnosis present

## 2022-05-18 DIAGNOSIS — I639 Cerebral infarction, unspecified: Secondary | ICD-10-CM | POA: Diagnosis present

## 2022-05-18 DIAGNOSIS — Z8679 Personal history of other diseases of the circulatory system: Secondary | ICD-10-CM

## 2022-05-18 DIAGNOSIS — I7101 Dissection of ascending aorta: Secondary | ICD-10-CM | POA: Diagnosis present

## 2022-05-18 LAB — URINALYSIS, ROUTINE W REFLEX MICROSCOPIC
Bilirubin Urine: NEGATIVE
Glucose, UA: NEGATIVE mg/dL
Ketones, ur: NEGATIVE mg/dL
Nitrite: NEGATIVE
Protein, ur: 100 mg/dL — AB
RBC / HPF: >50 RBC/hpf (ref 0–5)
Specific Gravity, Urine: 1.008 (ref 1.005–1.030)
WBC, UA: >50 WBC/hpf (ref 0–5)
pH: 7 (ref 5.0–8.0)

## 2022-05-18 LAB — CBC
HCT: 42.7 % (ref 39.0–52.0)
Hemoglobin: 13.3 g/dL (ref 13.0–17.0)
MCH: 29.8 pg (ref 26.0–34.0)
MCHC: 31.1 g/dL (ref 30.0–36.0)
MCV: 95.5 fL (ref 80.0–100.0)
Platelets: 273 10*3/uL (ref 150–400)
RBC: 4.47 MIL/uL (ref 4.22–5.81)
RDW: 15.3 % (ref 11.5–15.5)
WBC: 25.7 10*3/uL — ABNORMAL HIGH (ref 4.0–10.5)
nRBC: 0 % (ref 0.0–0.2)

## 2022-05-18 LAB — TROPONIN I (HIGH SENSITIVITY)
Troponin I (High Sensitivity): 52 ng/L — ABNORMAL HIGH (ref <18)
Troponin I (High Sensitivity): 65 ng/L — ABNORMAL HIGH (ref <18)

## 2022-05-18 LAB — LACTIC ACID, PLASMA
Lactic Acid, Venous: 8.1 mmol/L (ref 0.5–1.9)
Lactic Acid, Venous: 6.8 mmol/L (ref 0.5–1.9)

## 2022-05-18 LAB — BRAIN NATRIURETIC PEPTIDE: B Natriuretic Peptide: 266.3 pg/mL — ABNORMAL HIGH (ref 0.0–100.0)

## 2022-05-18 LAB — BASIC METABOLIC PANEL
Anion gap: 27 — ABNORMAL HIGH (ref 5–15)
BUN: 62 mg/dL — ABNORMAL HIGH (ref 8–23)
CO2: 11 mmol/L — ABNORMAL LOW (ref 22–32)
Calcium: 8.8 mg/dL — ABNORMAL LOW (ref 8.9–10.3)
Chloride: 92 mmol/L — ABNORMAL LOW (ref 98–111)
Creatinine, Ser: 5.11 mg/dL — ABNORMAL HIGH (ref 0.61–1.24)
GFR, Estimated: 12 mL/min — ABNORMAL LOW (ref >60)
Glucose, Bld: 129 mg/dL — ABNORMAL HIGH (ref 70–99)
Potassium: 3 mmol/L — ABNORMAL LOW (ref 3.5–5.1)
Sodium: 130 mmol/L — ABNORMAL LOW (ref 135–145)

## 2022-05-18 MED ORDER — SODIUM BICARBONATE 650 MG PO TABS
650.0000 mg | ORAL_TABLET | Freq: Two times a day (BID) | ORAL | Status: AC
  Administered 2022-05-19 (×2): 650 mg via ORAL
  Filled 2022-05-18 (×3): qty 1

## 2022-05-18 MED ORDER — APIXABAN 5 MG PO TABS
5.0000 mg | ORAL_TABLET | Freq: Two times a day (BID) | ORAL | Status: DC
  Administered 2022-05-18 – 2022-05-20 (×5): 5 mg via ORAL
  Filled 2022-05-18 (×5): qty 1

## 2022-05-18 MED ORDER — SODIUM CHLORIDE 0.9 % IV SOLN
2.0000 g | INTRAVENOUS | Status: DC
  Administered 2022-05-19 – 2022-05-23 (×5): 2 g via INTRAVENOUS
  Filled 2022-05-18: qty 2
  Filled 2022-05-18 (×2): qty 12.5
  Filled 2022-05-18: qty 2
  Filled 2022-05-18: qty 12.5

## 2022-05-18 MED ORDER — SODIUM CHLORIDE 0.9 % IV BOLUS
1000.0000 mL | Freq: Once | INTRAVENOUS | Status: AC
  Administered 2022-05-18: 1000 mL via INTRAVENOUS

## 2022-05-18 MED ORDER — ACETAMINOPHEN 650 MG RE SUPP: 650.0000 mg | Freq: Four times a day (QID) | RECTAL | Status: DC | PRN

## 2022-05-18 MED ORDER — SODIUM CHLORIDE 0.9 % IV BOLUS (SEPSIS)
1000.0000 mL | Freq: Once | INTRAVENOUS | Status: AC
  Administered 2022-05-18: 1000 mL via INTRAVENOUS

## 2022-05-18 MED ORDER — ACETAMINOPHEN 325 MG PO TABS
650.0000 mg | ORAL_TABLET | Freq: Once | ORAL | Status: AC
  Administered 2022-05-18: 650 mg via ORAL
  Filled 2022-05-18: qty 2

## 2022-05-18 MED ORDER — VANCOMYCIN HCL 1750 MG/350ML IV SOLN
1750.0000 mg | Freq: Once | INTRAVENOUS | Status: AC
  Administered 2022-05-18: 1750 mg via INTRAVENOUS
  Filled 2022-05-18: qty 350

## 2022-05-18 MED ORDER — POTASSIUM CHLORIDE CRYS ER 20 MEQ PO TBCR
20.0000 meq | EXTENDED_RELEASE_TABLET | Freq: Once | ORAL | Status: AC
  Administered 2022-05-19: 20 meq via ORAL
  Filled 2022-05-18: qty 1

## 2022-05-18 MED ORDER — SODIUM CHLORIDE 0.9 % IV BOLUS
500.0000 mL | Freq: Once | INTRAVENOUS | Status: AC
  Administered 2022-05-18: 500 mL via INTRAVENOUS

## 2022-05-18 MED ORDER — SENNOSIDES-DOCUSATE SODIUM 8.6-50 MG PO TABS: 1.0000 | ORAL_TABLET | Freq: Every evening | ORAL | Status: DC | PRN

## 2022-05-18 MED ORDER — ONDANSETRON HCL 4 MG PO TABS: 4.0000 mg | ORAL_TABLET | Freq: Four times a day (QID) | ORAL | Status: DC | PRN

## 2022-05-18 MED ORDER — DILTIAZEM HCL-DEXTROSE 125-5 MG/125ML-% IV SOLN (PREMIX)
5.0000 mg/h | INTRAVENOUS | Status: DC
  Administered 2022-05-18: 5 mg/h via INTRAVENOUS
  Filled 2022-05-18: qty 125

## 2022-05-18 MED ORDER — DILTIAZEM HCL 25 MG/5ML IV SOLN
10.0000 mg | Freq: Once | INTRAVENOUS | Status: AC
  Administered 2022-05-18: 10 mg via INTRAVENOUS
  Filled 2022-05-18: qty 5

## 2022-05-18 MED ORDER — SODIUM CHLORIDE 0.9 % IV SOLN
2.0000 g | Freq: Once | INTRAVENOUS | Status: AC
  Administered 2022-05-18: 2 g via INTRAVENOUS
  Filled 2022-05-18: qty 12.5

## 2022-05-18 MED ORDER — METOPROLOL TARTRATE 50 MG PO TABS
50.0000 mg | ORAL_TABLET | Freq: Two times a day (BID) | ORAL | Status: DC
  Administered 2022-05-18 – 2022-05-20 (×5): 50 mg via ORAL
  Filled 2022-05-18 (×5): qty 1

## 2022-05-18 MED ORDER — METRONIDAZOLE 500 MG/100ML IV SOLN
500.0000 mg | Freq: Two times a day (BID) | INTRAVENOUS | Status: DC
  Administered 2022-05-18: 500 mg via INTRAVENOUS
  Filled 2022-05-18: qty 100

## 2022-05-18 MED ORDER — ACETAMINOPHEN 325 MG PO TABS
650.0000 mg | ORAL_TABLET | Freq: Four times a day (QID) | ORAL | Status: DC | PRN
  Filled 2022-05-18: qty 2

## 2022-05-18 MED ORDER — VANCOMYCIN VARIABLE DOSE PER UNSTABLE RENAL FUNCTION (PHARMACIST DOSING): Status: DC

## 2022-05-18 MED ORDER — METOPROLOL TARTRATE 50 MG PO TABS: 50.0000 mg | ORAL_TABLET | Freq: Two times a day (BID) | ORAL | Status: DC

## 2022-05-18 MED ORDER — HEPARIN SODIUM (PORCINE) 5000 UNIT/ML IJ SOLN: 5000.0000 [IU] | Freq: Three times a day (TID) | INTRAMUSCULAR | Status: DC

## 2022-05-18 MED ORDER — LACTATED RINGERS IV SOLN: INTRAVENOUS | Status: DC

## 2022-05-18 MED ORDER — ONDANSETRON HCL 4 MG/2ML IJ SOLN: 4.0000 mg | Freq: Four times a day (QID) | INTRAMUSCULAR | Status: DC | PRN

## 2022-05-18 NOTE — ED Notes (Signed)
Per Cox, DO: , "if HR is less than 120 for about 30 minutes, we can dc the dilt"

## 2022-05-18 NOTE — Progress Notes (Signed)
Pharmacy Antibiotic Note  Mitchell Dixon is a 67 y.o. male admitted on 05/01/2022 with sepsis.  Pharmacy has been consulted for Cefepime & Vancomycin dosing for 7 days.  Plan: Cefepime 2 gm q24hr per indication & renal fxn.  Pt given Vancomycin 1750 mg once per pt wt: 77.6 kg.  Suspected AKI (2/27 SCr 1.22) - Variable Vancomycin dosing order placed.    Pharmacy will continue to follow and will adjust abx dosing whenever warranted.  Temp (24hrs), Avg:97.3 F (36.3 C), Min:97.3 F (36.3 C), Max:97.3 F (36.3 C)   Recent Labs  Lab 05/14/2022 2009 04/30/2022 2047  WBC 25.7*  --   CREATININE 5.11*  --   LATICACIDVEN  --  8.1*    Estimated Creatinine Clearance: 15.1 mL/min (A) (by C-G formula based on SCr of 5.11 mg/dL (H)).    No Known Allergies  Antimicrobials this admission: 3/20 Vancomycin >> x 7 days 3/19 Cefepime >> x 7 days 3/19 Flagyl >> x 7 days  Microbiology results: 3/19 BCx: Pending 3/19 UCx: Pending   Thank you for allowing pharmacy to be a part of this patient's care.  Renda Rolls, PharmD, [facility] 05/05/2022 10:32 PM

## 2022-05-18 NOTE — Assessment & Plan Note (Addendum)
Liver function tests ordered before atorvastatin 40 mg every evening can be resumed

## 2022-05-18 NOTE — Assessment & Plan Note (Signed)
Presumed secondary to acute kidney injury Sodium bicarbonate tablet 650 mg p.o. twice daily, 2 doses ordered first dose given now

## 2022-05-18 NOTE — ED Notes (Signed)
Unable to obtain second blood culture.  Pt hard stick.  Provider notified

## 2022-05-18 NOTE — ED Notes (Signed)
Per Provider Amy Cox, to continue to monitor pt at 80mL/hr on dilt.  And administer home metoprolol

## 2022-05-18 NOTE — Assessment & Plan Note (Addendum)
Continue diltiazem gtt. Resumed metoprolol home dosing of 50 mg p.o. twice daily as patient is minimally improving on diltiazem gtt. Given that his home rate control is a beta-blockade, if his heart rate is less than 120 for about 30 minutes, please stop diltiazem gtt. Discussed with nursing via secure chat

## 2022-05-18 NOTE — ED Notes (Signed)
Pt hard stick, needing second line for incompatible meds and continued labs.

## 2022-05-18 NOTE — ED Notes (Signed)
Lab personnel to collect hepatic panel from previous collection of pt blood work

## 2022-05-18 NOTE — Assessment & Plan Note (Signed)
Patient self cath since discharge from the hospital

## 2022-05-18 NOTE — Assessment & Plan Note (Addendum)
Sodium with further decreased to 129 after getting IV fluid.  Elevated serum osmolality at 300 with normal urine osmolality and urine sodium of 87. Most likely with AKI -Fluid restriction -Monitor sodium

## 2022-05-18 NOTE — Assessment & Plan Note (Addendum)
Suspect secondary to acute kidney injury Check magnesium level Potassium chloride 20 mill equivalent p.o. one-time dose ordered due to patient having acute kidney injury Repeat potassium level at 1 AM on 3/20

## 2022-05-18 NOTE — Progress Notes (Signed)
CODE SEPSIS - PHARMACY COMMUNICATION  **Broad Spectrum Antibiotics should be administered within 1 hour of Sepsis diagnosis**  Time Code Sepsis Called/Page Received: 2215  Antibiotics Ordered: Cefepime  Time of 1st antibiotic administration: [place], PharmD, [facility] 05/29/2022 10:13 PM

## 2022-05-18 NOTE — Assessment & Plan Note (Addendum)
I suspect this is secondary to obstructive acute kidney injury, complicated by urinary tract infection Patient has Foley placement and per nursing urine output is purulence and cloudy Repeat BMP in the a.m.

## 2022-05-18 NOTE — H&P (Addendum)
History and Physical   Mitchell Dixon [id number] DOB: May 29, 1955 DOA: 05/24/2022  PCP: Mitchell Coffin, MD  Outpatient Specialists: Dr. Ronita [facility], [facility] vascular Patient coming from: Home via EMS  I have personally briefly reviewed patient's old medical records in [place].  Chief Concern: Normal distention, urinary retention  HPI: 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  Patient was found to be in atrial fibrillation with RVR.  Vitals in the ED showed temperature of 97.3, respiration rate of 39, heart rate 179, blood pressure 117/105, SpO2 of 95% on room air, and then decreased to 85% and was placed on 3 L nasal cannula with SpO2 of 95%.  Serum sodium is 130, potassium 3.0, chloride 92, bicarb 11, BUN of 62, serum creatinine of 5.11, EGFR of 12, nonfasting blood glucose 129, lactic acid 8.1, BNP elevated at 266.3, high sensitive troponin was elevated at 52.  ED treatment: Cefepime, diltiazem, sodium chloride 2 L bolus, sodium chloride 500 mL bolus, diltiazem GGT. ------------------------------ At bedside, patient was able to tell me his full name, his age, the current calendar year and his current location.  He reports that he has not had any urine output for a little over a day.  Yesterday he had a small amount of urine that was pink/light red.  He endorses abdominal distention that has worsened today prompting him to present to the emergency department for further evaluation.  He denies chest pain, shortness of breath, nausea, vomiting, diarrhea.  He denies dysuria.  Social history: He lives at home with his sister.  He denies current tobacco use, EtOH, recreational drug use.  He is  disabled.  ROS: Constitutional: no weight change, no fever ENT/Mouth: no sore throat, no rhinorrhea Eyes: no eye pain, no vision changes Cardiovascular: no chest pain, no dyspnea,  no edema, no palpitations Respiratory: no cough, no sputum, no wheezing Gastrointestinal: no nausea, no vomiting, no diarrhea, no constipation Genitourinary: no urinary incontinence, no dysuria, + hematuria, + urinary retention Musculoskeletal: no arthralgias, no myalgias Skin: no skin lesions, no pruritus, multiple bilateral lower extremity skin changes, wounds, appears small and does not appear to be acutely infected. Neuro: + weakness, no loss of consciousness, no syncope Psych: no anxiety, no depression, no decrease appetite Heme/Lymph: no bruising, no bleeding  ED Course: Discussed with emergency medicine provider, patient requiring hospitalization for chief concerns of sepsis.  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)   History of cardiac pacemaker   Neurogenic bladder   Atrophic kidney   Atrial fibrillation with rapid ventricular response (HCC)   Hyponatremia   Elevated troponin   Metabolic acidosis   Assessment and Plan:  * Severe sepsis ([place]) Without septic shock at this time Patient had elevated respiration rate, heart rate, atrial fibrillation with RVR, organ involvement is renal and cardiac, suspected source is urine Cefepime and vancomycin per pharmacy, metronidazole 500 mg twice daily Elevated lactic acid x 2, downtrending Blood  cultures x 2 have been ordered and pending collection Urine cultures in process Maintain MAP greater than 65 Continue diltiazem gtt. Admit  to PCU, inpatient  AKI (acute kidney injury) ([place]) I suspect this is secondary to obstructive acute kidney injury, complicated by urinary tract infection Patient has Foley placement and per nursing urine output is purulence and cloudy Repeat BMP in the a.m.  Hypokalemia Suspect secondary to acute kidney injury Check magnesium level Potassium chloride 20 mill equivalent p.o. one-time dose ordered due to patient having acute kidney injury Repeat potassium level at 1 AM on 0000000  Metabolic acidosis Presumed secondary to acute kidney injury Sodium bicarbonate tablet 650 mg p.o. twice daily, 2 doses ordered first dose given now  Elevated troponin I suspect this is secondary to demand ischemia in setting of atrial fibrillation with RVR as patient denies chest pain and shortness of breath  Hyponatremia Hypochloremia Status post sodium chloride 2.5 L bolus per EDP Check serum osmolality, urine osmolality, urine sodium level LR 150 mL/h, 1 day ordered  Atrial fibrillation with rapid ventricular response (HCC) Continue diltiazem gtt. Resumed metoprolol home dosing of 50 mg p.o. twice daily as patient is minimally improving on diltiazem gtt. Given that his home rate control is a beta-blockade, if his heart rate is less than 120 for about 30 minutes, please stop diltiazem gtt. Discussed with nursing via secure chat  Neurogenic bladder Patient self cath since discharge from the hospital  Hyperlipidemia Liver function tests ordered before atorvastatin 40 mg every evening can be resumed  Hypertension Metoprolol tartrate 50 mg p.o. twice daily resumed  Chart reviewed.   Complete echo on 03/23/2022: Estimated ejection fraction is 50 to 55%  DVT prophylaxis: Eliquis Code Status: Full code Diet: Renal Family Communication: Discussed with brother, [name] at bedside with patient's permission Disposition Plan: Pending clinical course Consults called: Pharmacy for antibiotic Admission  status: PCU, inpatient  Past Medical History:  Diagnosis Date   Ascending aortic dissection ([place])    a. 01/2017 s/p emergent repair @ [facility].   Cardiac pacemaker in situ    a. 03/2017 s/p [facility] [place] (ser# [id number]).   Chronic HFrEF (heart failure with reduced ejection fraction) ([place])    a. 06/2016 Echo: EF 45%, diff HK, GrI DD; b. 03/2017 Echo: EF 55-60%, apical wma, Gr2 DD; c. 06/2017 Echo: EF 55-60%, apical, apical inf, and mid antsept wma; d. 07/2018 Echo: EF 45%, apical thinning & AK. Dil Asc Ao.   Essential hypertension    LV (left ventricular) mural thrombus    a. 06/2016 Echo: EF 45%, diff HK w/ thrombus (0.9 x 0.9 x.0.7cm), GrI DD, mild AI/MR.   Mixed hyperlipidemia    PAD (peripheral artery disease) (HCC)    PAF (paroxysmal atrial fibrillation) (HCC)    a. CHA2DS2VASc = 5-->chronic eliquis.   Paraplegia ([place])    a.  Spinal cord ischemia and injury in the setting of aortic dissection in December 2018.   Stroke [facility]) 06/2016   Urinary incontinence    a. in setting of paraplegia - self catheterizes.   Past Surgical History:  Procedure Laterality Date   DIALYSIS/PERMA CATHETER INSERTION N/A 02/25/2017   Procedure: DIALYSIS/PERMA CATHETER INSERTION;  Surgeon: Algernon Huxley, MD;  Location: [place] [facility];  Service: Cardiovascular;  Laterality: N/A;   EMBOLECTOMY Right 02/23/2017   Procedure: EMBOLECTOMY;  Surgeon: Katha Cabal, MD;  Location: [facility] ORS;  Service: Vascular;  Laterality: Right;   FASCIECTOMY Right 02/23/2017   Procedure: FASCIECTOMY;  Surgeon: Katha Cabal, MD;  Location: [facility] ORS;  Service: Vascular;  Laterality: Right;   HERNIA REPAIR     Social History:  reports that he has been smoking cigarettes. He has a 7.50 pack-year smoking history. He has never used smokeless tobacco. He reports that he does not drink alcohol and does not use drugs.  No Known Allergies Family History  Problem Relation Age of Onset   Heart failure Mother     Dementia Mother    Hypertension Mother    Pneumonia Father    Heart failure Sister    Hypertension Sister    Family history: Family history reviewed and not pertinent  Prior to Admission medications   Medication Sig Start Date End Date Taking? Authorizing Provider  atorvastatin (LIPITOR) 40 MG tablet Take 40 mg by mouth every evening.    [provider]  budesonide (PULMICORT) 0.25 MG/2ML nebulizer solution Take 2 mLs (0.25 mg total) by nebulization every 6 (six) hours. Patient not taking: Reported on 03/21/2022 02/26/17   Tukov-Yual, Arlyss Gandy, NP  ELIQUIS 5 MG TABS tablet Take 5 mg by mouth 2 (two) times daily.    [provider]  ipratropium-albuterol (DUONEB) 0.5-2.5 (3) MG/3ML SOLN Take 3 mLs by nebulization every 6 (six) hours. Patient not taking: Reported on 03/21/2022 02/26/17   Erlene Quan, NP  metoprolol tartrate (LOPRESSOR) 50 MG tablet Take 50 mg by mouth 2 (two) times daily.    [provider]   Physical Exam: Vitals:   05/08/2022 2300 05/24/2022 2315 05/19/2022 2320 05/02/2022 2332  BP:   (!) 138/98   Pulse:   (!) 136 (!) 129  Resp: (!) 24 (!) 31 (!) 29   Temp:      TempSrc:      SpO2:   95%   Weight:      Height:       Constitutional: appears age-appropriate, frail, chronically ill, NAD, calm, comfortable Eyes: PERRL, lids and conjunctivae normal ENMT: Mucous membranes are moist. Posterior pharynx clear of any exudate or lesions. Age-appropriate dentition. Hearing appropriate Neck: normal, supple, no masses, no thyromegaly Respiratory: clear to auscultation bilaterally, no wheezing, no crackles. Normal respiratory effort. No accessory muscle use.  Cardiovascular: Regular rate and rhythm, no murmurs / rubs / gallops. No extremity edema. 2+ pedal pulses. No carotid bruits.  Abdomen: Generalized tenderness, + abdominal distention, no masses palpated, no hepatosplenomegaly. Bowel sounds positive.  Musculoskeletal: no clubbing / cyanosis.  No joint deformity upper and lower extremities.  No range of motion at the bilateral lower extremity.  No contractures.  Bilateral lower extremity atrophy and decreased muscle tone.  Bilateral upper extremity was negative for atrophy or decrease in muscle tone. Skin: no rashes, lesions, ulcers. No induration Neurologic: Sensation intact. Strength 5/5 in all 4.  Psychiatric: Normal judgment and insight. Alert and oriented x 3.  Depressed mood.   EKG: Ordered and pending completion  Chest x-ray on Admission: I personally reviewed and I agree with radiologist reading as below.  DG Chest 1 View  Result Date: 05/26/2022 CLINICAL DATA:  Atrial fibrillation. EXAM: CHEST  1 VIEW COMPARISON:  03/20/2022 FINDINGS: Prior median sternotomy. The heart is normal in size. 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 Lead less pacemaker in place. No pulmonary edema, pleural effusion, or pneumothorax. On limited assessment, no acute osseous finding. IMPRESSION: No acute findings. Electronically Signed   By: Keith Rake M.D.   On: 05/01/2022 20:55  Labs on Admission: I have personally reviewed following labs  CBC: Recent Labs  Lab 05/28/2022 2009  WBC 25.7*  HGB 13.3  HCT 42.7  MCV 95.5  PLT 123456   Basic Metabolic Panel: Recent Labs  Lab 05/17/2022 2009  NA 130*  K 3.0*  CL 92*  CO2 11*  GLUCOSE 129*  BUN 62*  CREATININE 5.11*  CALCIUM 8.8*   GFR: Estimated Creatinine Clearance: 15.1 mL/min (A) (by C-G formula based on SCr of 5.11 mg/dL (H)).  Urine analysis:    Component Value Date/Time   COLORURINE YELLOW (A) 05/23/2022 2019   APPEARANCEUR TURBID (A) 05/23/2022 2019   LABSPEC 1.008 05/02/2022 2019   PHURINE 7.0 05/26/2022 2019   GLUCOSEU NEGATIVE 05/05/2022 2019   HGBUR LARGE (A) 05/13/2022 2019   BILIRUBINUR NEGATIVE 05/30/2022 2019   KETONESUR NEGATIVE 05/21/2022 2019   PROTEINUR 100 (A) 05/19/2022 2019    NITRITE NEGATIVE 05/02/2022 2019   LEUKOCYTESUR LARGE (A) 05/10/2022 2019   CRITICAL CARE Performed by: Dr. Tobie Poet  Total critical care time: 35 minutes  Critical care time was exclusive of separately billable procedures and treating other patients.  Critical care was necessary to treat or prevent imminent or life-threatening deterioration.  Critical care was time spent personally by me on the following activities: development of treatment plan with patient and/or surrogate as well as nursing, discussions with consultants, evaluation of patient's response to treatment, examination of patient, obtaining history from patient or surrogate, ordering and performing treatments and interventions, ordering and review of laboratory studies, ordering and review of radiographic studies, pulse oximetry and re-evaluation of patient's condition.  This document was prepared using Dragon Voice Recognition software and may include unintentional dictation errors.  Dr. Tobie Poet [facility]  If 7PM-7AM, please contact overnight-coverage provider If 7AM-7PM, please contact day coverage provider [email or web address]  05/25/2022, 11:41 PM

## 2022-05-18 NOTE — Assessment & Plan Note (Addendum)
Without septic shock at this time Patient had elevated respiration rate, heart rate, atrial fibrillation with RVR, organ involvement is renal and cardiac, suspected source is urine.  Blood cultures with Klebsiella erogenous, urine cultures pending. Severe sepsis with AKI and significantly elevated lactic acid which now trending down.  Received IV fluid and broad-spectrum antibiotics. -Continue with cefepime -Stop vancomycin -Follow-up final culture results

## 2022-05-18 NOTE — ED Triage Notes (Signed)
Pt called [facility] for urinary retention. Foley in place on ems arrival. States no urine output with distended abd since last night. Last bm today. Reports nausea no emesis. 4 mg zofran given en route.   Pt found to be in Afib RVR on ems arrival with HR 190. Hx of afib.   Pt paraplegic and non ambulatory at baseline.   EMS reports SBP 80's. Pt reports SOB and dizziness. Pt takes daily eliquis.

## 2022-05-18 NOTE — Consult Note (Signed)
PHARMACY -  BRIEF ANTIBIOTIC NOTE   Pharmacy has received consult(s) for cefepime from an ED provider.  The patient's profile has been reviewed for ht/wt/allergies/indication/available labs.    One time order(s) placed for  --Cefepime 2 g IV  Further antibiotics/pharmacy consults should be ordered by admitting physician if indicated.                       Thank you, Benita Gutter 05/05/2022  9:23 PM

## 2022-05-18 NOTE — Hospital Course (Signed)
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  Patient was found to be in atrial fibrillation with RVR.  Vitals in the ED showed temperature of 97.3, respiration rate of 39, heart rate 179, blood pressure 117/105, SpO2 of 95% on room air, and then decreased to 85% and was placed on 3 L nasal cannula with SpO2 of 95%.  Serum sodium is 130, potassium 3.0, chloride 92, bicarb 11, BUN of 62, serum creatinine of 5.11, EGFR of 12, nonfasting blood glucose 129, lactic acid 8.1, BNP elevated at 266.3, high sensitive troponin was elevated at 52.  ED treatment: Cefepime, diltiazem, sodium chloride 2 L bolus, sodium chloride 500 mL bolus, diltiazem GGT.

## 2022-05-18 NOTE — Assessment & Plan Note (Signed)
Metoprolol tartrate 50 mg p.o. twice daily resumed

## 2022-05-18 NOTE — Assessment & Plan Note (Addendum)
I suspect this is secondary to demand ischemia in setting of atrial fibrillation with RVR as patient denies chest pain and shortness of breath. Peaked at 165 and now trending down

## 2022-05-18 NOTE — ED Provider Notes (Signed)
[facility] Provider Note    Event Date/Time   First MD Initiated Contact with Patient 05/15/2022 1946     (approximate)   History   No chief complaint on file.   HPI  Mitchell Dixon is a 67 y.o. 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  The patient states that he has a Foley catheter in place.  He has had no urine output since sometime yesterday and has progressively had lower abdominal distention and pain.  He denies any significant blood in the urine.  He has had no fever or chills.  He was found by EMS to be in rapid atrial fibrillation.  The patient denies any chest pain, difficulty breathing, or lightheadedness.  He has no palpitations.  He states that he has taken all of his medications today.  I reviewed the past medical records.  The patient was most recently admitted in January.  Per the hospitalist discharge summary from 1/27 he presented with productive cough, shortness of breath, and generalized weakness.  He was treated for UTI and acute sepsis.   Physical Exam   Triage Vital Signs: ED Triage Vitals [05/10/2022 1943]  Enc Vitals Group     BP      Pulse      Resp      Temp      Temp src      SpO2      Weight 171 lb (77.6 kg)     Height 5\' 10"  (1.778 m)     Head Circumference      Peak Flow      Pain Score 6     Pain Loc      Pain Edu?      Excl. in [place]?     Most recent vital signs: Vitals:   05/06/2022 2345 05/19/22 0000  BP:  129/88  Pulse:    Resp: (!) 27 (!) 27  Temp:    SpO2:       General: Alert and oriented, no acute distress. CV:  Good peripheral perfusion. Tachycardic, irregular rhythm. Resp:  Normal effort.  Abd:  Lower abdominal distention.  Mild tenderness. Other:  No peripheral edema.   ED Results / Procedures / Treatments    Labs (all labs ordered are listed, but only abnormal results are displayed) Labs Reviewed  BASIC METABOLIC PANEL - Abnormal; Notable for the following components:      Result Value   Sodium 130 (*)    Potassium 3.0 (*)    Chloride 92 (*)    CO2 11 (*)    Glucose, Bld 129 (*)    BUN 62 (*)    Creatinine, Ser 5.11 (*)    Calcium 8.8 (*)    GFR, Estimated 12 (*)    Anion gap 27 (*)    All other components within normal limits  CBC - Abnormal; Notable for the following components:   WBC 25.7 (*)    All other components within normal limits  URINALYSIS, ROUTINE W REFLEX MICROSCOPIC - Abnormal; Notable for the following components:   Color, Urine YELLOW (*)    APPearance TURBID (*)    Hgb urine dipstick LARGE (*)    Protein, ur 100 (*)    Leukocytes,Ua LARGE (*)    Bacteria, UA MANY (*)    All other components within normal limits  BRAIN NATRIURETIC  PEPTIDE - Abnormal; Notable for the following components:   B Natriuretic Peptide 266.3 (*)    All other components within normal limits  LACTIC ACID, PLASMA - Abnormal; Notable for the following components:   Lactic Acid, Venous 8.1 (*)    All other components within normal limits  LACTIC ACID, PLASMA - Abnormal; Notable for the following components:   Lactic Acid, Venous 6.8 (*)    All other components within normal limits  TROPONIN I (HIGH SENSITIVITY) - Abnormal; Notable for the following components:   Troponin I (High Sensitivity) 52 (*)    All other components within normal limits  TROPONIN I (HIGH SENSITIVITY) - Abnormal; Notable for the following components:   Troponin I (High Sensitivity) 65 (*)    All other components within normal limits  URINE CULTURE  CULTURE, BLOOD (ROUTINE X 2)  CULTURE, BLOOD (ROUTINE X 2)  PROTIME-INR  CORTISOL-AM, BLOOD  HEPATIC FUNCTION PANEL  BASIC METABOLIC PANEL  CBC  MAGNESIUM  MAGNESIUM  OSMOLALITY  OSMOLALITY, URINE  SODIUM, URINE, RANDOM     EKG   RADIOLOGY  Chest  x-ray: I independently viewed and interpreted the images; there is no focal consolidation or edema   PROCEDURES:  Critical Care performed: Yes, see critical care procedure note(s)  .Critical Care  Performed by: Arta Silence, MD Authorized by: Arta Silence, MD   Critical care provider statement:    Critical care time (minutes):  45   Critical care time was exclusive of:  Separately billable procedures and treating other patients   Critical care was necessary to treat or prevent imminent or life-threatening deterioration of the following conditions:  Circulatory failure   Critical care was time spent personally by me on the following activities:  Development of treatment plan with patient or surrogate, discussions with consultants, evaluation of patient's response to treatment, examination of patient, ordering and review of laboratory studies, ordering and review of radiographic studies, ordering and performing treatments and interventions, pulse oximetry, re-evaluation of patient's condition, review of old charts and obtaining history from patient or surrogate   Care discussed with: admitting provider      MEDICATIONS ORDERED IN ED: Medications  diltiazem (CARDIZEM) 125 mg in dextrose 5% 125 mL (1 mg/mL) infusion (15 mg/hr Intravenous Rate/Dose Change 05/22/2022 2228)  acetaminophen (TYLENOL) tablet 650 mg (has no administration in time range)    Or  acetaminophen (TYLENOL) suppository 650 mg (has no administration in time range)  ondansetron (ZOFRAN) tablet 4 mg (has no administration in time range)    Or  ondansetron (ZOFRAN) injection 4 mg (has no administration in time range)  metroNIDAZOLE (FLAGYL) IVPB 500 mg (500 mg Intravenous New Bag/Given 05/12/2022 2305)  senna-docusate (Senokot-S) tablet 1 tablet (has no administration in time range)  vancomycin (VANCOREADY) IVPB 1750 mg/350 mL (1,750 mg Intravenous New Bag/Given 05/23/2022 2330)  vancomycin variable dose per unstable  renal function (pharmacist dosing) (has no administration in time range)  ceFEPIme (MAXIPIME) 2 g in sodium chloride 0.9 % 100 mL IVPB (has no administration in time range)  apixaban (ELIQUIS) tablet 5 mg (5 mg Oral Given 05/22/2022 2306)  metoprolol tartrate (LOPRESSOR) tablet 50 mg (50 mg Oral Given 05/24/2022 2332)  potassium chloride SA (KLOR-CON M) CR tablet 20 mEq (has no administration in time range)  lactated ringers infusion (has no administration in time range)  sodium bicarbonate tablet 650 mg (has no administration in time range)  diltiazem (CARDIZEM) injection 10 mg (10 mg Intravenous Given 05/13/2022 2014)  sodium chloride  0.9 % bolus 500 mL (0 mLs Intravenous Stopped 05/08/2022 2041)  sodium chloride 0.9 % bolus 1,000 mL (0 mLs Intravenous Stopped 05/30/2022 2332)  ceFEPIme (MAXIPIME) 2 g in sodium chloride 0.9 % 100 mL IVPB (0 g Intravenous Stopped 05/26/2022 2310)  acetaminophen (TYLENOL) tablet 650 mg (650 mg Oral Given 05/12/2022 2228)  sodium chloride 0.9 % bolus 1,000 mL (0 mLs Intravenous Stopped 05/12/2022 2334)     IMPRESSION / MDM / ASSESSMENT AND PLAN / ED COURSE  I reviewed the triage vital signs and the nursing notes.  66 year old male with PMH as noted above with a Foley catheter in place presents with acute urinary retention since yesterday and was found to be in atrial fibrillation with RVR although is asymptomatic.  Cardiac monitor shows atrial fibrillation with RVR.  Differential diagnosis includes, but is not limited to, Foley catheter malfunction or obstruction, hematuria, UTI.  The patient states he is compliant with his medications.  The tachycardia may be due to atrial fibrillation exacerbation, acute pain, dehydration, or possible sepsis.  We will replace the catheter, obtain lab workup including cardiac enzymes and BNP to rule out acute CHF, give fluids, and reassess.  Patient's presentation is most consistent with acute presentation with potential threat to life or bodily  function.  The patient is on the cardiac monitor to evaluate for evidence of arrhythmia and/or significant heart rate changes.  ----------------------------------------- 11:05 PM on 05/08/2022 -----------------------------------------  Urinalysis shows significant WBCs and RBCs.  There is gross hematuria after the replacement of the Foley although the patient has had relief of his retention.  WBC count is 25.  Lactate is significantly elevated at 8.  Overall presentation is consistent with acute urinary tract infection/sepsis.  I ordered cefepime for coverage of healthcare associated UTI as well as fluids per the sepsis protocol and blood cultures.  The heart rate transiently improved with Cardizem bolus.  I suspect that the tachycardia is both due to atrial fibrillation as well as the patient's sepsis although given that the rate went up to the 170s it is out of proportion for would be expected from sepsis.  In addition to the fluids I have placed the patient on a Cardizem infusion.  Troponin is minimally elevated, consistent with demand ischemia.  On reassessment the patient remains alert and denies any new or worsening symptoms.  He will need admission for further management.  I consulted Dr. Tobie Poet from the hospitalist service; based on her discussion she agrees to admit the patient.  FINAL CLINICAL IMPRESSION(S) / ED DIAGNOSES   Final diagnoses:  Urinary tract infection with hematuria, site unspecified  Sepsis, due to unspecified organism, unspecified whether acute organ dysfunction present [facility])  Urinary retention  Atrial fibrillation with RVR ([place])     Rx / DC Orders   ED Discharge Orders     None        Note:  This document was prepared using Dragon voice recognition software and may include unintentional dictation errors.    Arta Silence, MD 05/19/22 [phone]

## 2022-05-18 NOTE — ED Notes (Signed)
First set of Blood cultures collected by Anda Kraft, RN during placement of second IV in r forearm.  Cultures obtained prior to administration of abx

## 2022-05-19 ENCOUNTER — Inpatient Hospital Stay: Payer: Medicaid Other

## 2022-05-19 DIAGNOSIS — G8221 Paraplegia, complete: Secondary | ICD-10-CM

## 2022-05-19 DIAGNOSIS — E871 Hypo-osmolality and hyponatremia: Secondary | ICD-10-CM

## 2022-05-19 DIAGNOSIS — A419 Sepsis, unspecified organism: Secondary | ICD-10-CM | POA: Diagnosis not present

## 2022-05-19 DIAGNOSIS — N319 Neuromuscular dysfunction of bladder, unspecified: Secondary | ICD-10-CM | POA: Diagnosis not present

## 2022-05-19 DIAGNOSIS — I4891 Unspecified atrial fibrillation: Secondary | ICD-10-CM

## 2022-05-19 DIAGNOSIS — N179 Acute kidney failure, unspecified: Secondary | ICD-10-CM | POA: Diagnosis not present

## 2022-05-19 DIAGNOSIS — R7989 Other specified abnormal findings of blood chemistry: Secondary | ICD-10-CM

## 2022-05-19 DIAGNOSIS — E872 Acidosis, unspecified: Secondary | ICD-10-CM

## 2022-05-19 LAB — BLOOD CULTURE ID PANEL (REFLEXED) - BCID2

## 2022-05-19 LAB — CBC
HCT: 36.5 % — ABNORMAL LOW (ref 39.0–52.0)
Hemoglobin: 11.7 g/dL — ABNORMAL LOW (ref 13.0–17.0)
MCH: 29.9 pg (ref 26.0–34.0)
MCHC: 32.1 g/dL (ref 30.0–36.0)
MCV: 93.4 fL (ref 80.0–100.0)
Platelets: 195 10*3/uL (ref 150–400)
RBC: 3.91 MIL/uL — ABNORMAL LOW (ref 4.22–5.81)
RDW: 15.4 % (ref 11.5–15.5)
WBC: 25 10*3/uL — ABNORMAL HIGH (ref 4.0–10.5)
nRBC: 0 % (ref 0.0–0.2)

## 2022-05-19 LAB — PROTIME-INR
INR: 2.7 — ABNORMAL HIGH (ref 0.8–1.2)
Prothrombin Time: 28.4 seconds — ABNORMAL HIGH (ref 11.4–15.2)

## 2022-05-19 LAB — OSMOLALITY, URINE: Osmolality, Ur: 350 mOsm/kg (ref 300–900)

## 2022-05-19 LAB — MAGNESIUM
Magnesium: 1.6 mg/dL — ABNORMAL LOW (ref 1.7–2.4)
Magnesium: 1.8 mg/dL (ref 1.7–2.4)

## 2022-05-19 LAB — TROPONIN I (HIGH SENSITIVITY)
Troponin I (High Sensitivity): 112 ng/L (ref <18)
Troponin I (High Sensitivity): 165 ng/L (ref <18)
Troponin I (High Sensitivity): 108 ng/L (ref <18)

## 2022-05-19 LAB — BASIC METABOLIC PANEL
Anion gap: 14 (ref 5–15)
BUN: 63 mg/dL — ABNORMAL HIGH (ref 8–23)
CO2: 13 mmol/L — ABNORMAL LOW (ref 22–32)
Calcium: 7.5 mg/dL — ABNORMAL LOW (ref 8.9–10.3)
Chloride: 102 mmol/L (ref 98–111)
Creatinine, Ser: 4.22 mg/dL — ABNORMAL HIGH (ref 0.61–1.24)
GFR, Estimated: 15 mL/min — ABNORMAL LOW (ref >60)
Glucose, Bld: 69 mg/dL — ABNORMAL LOW (ref 70–99)
Potassium: 3.2 mmol/L — ABNORMAL LOW (ref 3.5–5.1)
Sodium: 129 mmol/L — ABNORMAL LOW (ref 135–145)

## 2022-05-19 LAB — HEPATIC FUNCTION PANEL
ALT: 27 U/L (ref 0–44)
AST: 64 U/L — ABNORMAL HIGH (ref 15–41)
Albumin: 2.6 g/dL — ABNORMAL LOW (ref 3.5–5.0)
Alkaline Phosphatase: 145 U/L — ABNORMAL HIGH (ref 38–126)
Bilirubin, Direct: 0.9 mg/dL — ABNORMAL HIGH (ref 0.0–0.2)
Indirect Bilirubin: 0.9 mg/dL (ref 0.3–0.9)
Total Bilirubin: 1.8 mg/dL — ABNORMAL HIGH (ref 0.3–1.2)
Total Protein: 7.5 g/dL (ref 6.5–8.1)

## 2022-05-19 LAB — LACTIC ACID, PLASMA
Lactic Acid, Venous: 2.7 mmol/L (ref 0.5–1.9)
Lactic Acid, Venous: 2.3 mmol/L (ref 0.5–1.9)

## 2022-05-19 LAB — OSMOLALITY: Osmolality: 300 mOsm/kg — ABNORMAL HIGH (ref 275–295)

## 2022-05-19 LAB — CORTISOL-AM, BLOOD: Cortisol - AM: 54.9 ug/dL — ABNORMAL HIGH (ref 6.7–22.6)

## 2022-05-19 LAB — SODIUM, URINE, RANDOM: Sodium, Ur: 87 mmol/L

## 2022-05-19 MED ORDER — LEVALBUTEROL HCL 0.63 MG/3ML IN NEBU
0.6300 mg | INHALATION_SOLUTION | Freq: Four times a day (QID) | RESPIRATORY_TRACT | Status: DC | PRN
  Administered 2022-05-19: 0.63 mg via RESPIRATORY_TRACT
  Filled 2022-05-19: qty 3

## 2022-05-19 MED ORDER — LEVALBUTEROL HCL 0.63 MG/3ML IN NEBU
0.6300 mg | INHALATION_SOLUTION | Freq: Once | RESPIRATORY_TRACT | Status: AC
  Administered 2022-05-19: 0.63 mg via RESPIRATORY_TRACT
  Filled 2022-05-19: qty 3

## 2022-05-19 MED ORDER — CHLORHEXIDINE GLUCONATE CLOTH 2 % EX PADS
6.0000 | MEDICATED_PAD | Freq: Every day | CUTANEOUS | Status: DC
  Administered 2022-05-19 – 2022-05-24 (×6): 6 via TOPICAL

## 2022-05-19 MED ORDER — MAGNESIUM SULFATE 4 GM/100ML IV SOLN
4.0000 g | Freq: Once | INTRAVENOUS | Status: AC
  Administered 2022-05-19: 4 g via INTRAVENOUS
  Filled 2022-05-19: qty 100

## 2022-05-19 MED ORDER — SODIUM BICARBONATE 650 MG PO TABS
650.0000 mg | ORAL_TABLET | Freq: Two times a day (BID) | ORAL | Status: DC
  Administered 2022-05-19 – 2022-05-20 (×3): 650 mg via ORAL
  Filled 2022-05-19 (×4): qty 1

## 2022-05-19 MED ORDER — IPRATROPIUM-ALBUTEROL 0.5-2.5 (3) MG/3ML IN SOLN: 3.0000 mL | Freq: Four times a day (QID) | RESPIRATORY_TRACT | Status: DC | PRN

## 2022-05-19 NOTE — Progress Notes (Signed)
PHARMACY - PHYSICIAN COMMUNICATION CRITICAL VALUE ALERT - BLOOD CULTURE IDENTIFICATION (BCID)  Mitchell Dixon is an 67 y.o. male who presented to [facility] on 05/29/2022 with a chief complaint of urinary retention with foley catheter in place at time of presentation for neurogenic bladder  Assessment:  Blood cultures from 3/20 with GNR, BCID detects K. Aerogenes, likely urinary source  Name of physician (or Provider) Contacted: Dr Reesa Chew  Current antibiotics: Cefepime  Changes to prescribed antibiotics recommended:  Patient is on recommended antibiotics - No changes needed  Results for orders placed or performed during the hospital encounter of 05/09/2022  Blood Culture ID Panel (Reflexed) (Collected: 05/14/2022 10:41 PM)  Result Value Ref Range   Enterococcus faecalis NOT DETECTED NOT DETECTED   Enterococcus Faecium NOT DETECTED NOT DETECTED   Listeria monocytogenes NOT DETECTED NOT DETECTED   Staphylococcus species NOT DETECTED NOT DETECTED   Staphylococcus aureus (BCID) NOT DETECTED NOT DETECTED   Staphylococcus epidermidis NOT DETECTED NOT DETECTED   Staphylococcus lugdunensis NOT DETECTED NOT DETECTED   Streptococcus species NOT DETECTED NOT DETECTED   Streptococcus agalactiae NOT DETECTED NOT DETECTED   Streptococcus pneumoniae NOT DETECTED NOT DETECTED   Streptococcus pyogenes NOT DETECTED NOT DETECTED   A.calcoaceticus-baumannii NOT DETECTED NOT DETECTED   Bacteroides fragilis NOT DETECTED NOT DETECTED   Enterobacterales DETECTED (A) NOT DETECTED   Enterobacter cloacae complex NOT DETECTED NOT DETECTED   Escherichia coli NOT DETECTED NOT DETECTED   Klebsiella aerogenes DETECTED (A) NOT DETECTED   Klebsiella oxytoca NOT DETECTED NOT DETECTED   Klebsiella pneumoniae NOT DETECTED NOT DETECTED   Proteus species NOT DETECTED NOT DETECTED   Salmonella species NOT DETECTED NOT DETECTED   Serratia marcescens NOT DETECTED NOT DETECTED   Haemophilus influenzae NOT DETECTED  NOT DETECTED   Neisseria meningitidis NOT DETECTED NOT DETECTED   Pseudomonas aeruginosa NOT DETECTED NOT DETECTED   Stenotrophomonas maltophilia NOT DETECTED NOT DETECTED   Candida albicans NOT DETECTED NOT DETECTED   Candida auris NOT DETECTED NOT DETECTED   Candida glabrata NOT DETECTED NOT DETECTED   Candida krusei NOT DETECTED NOT DETECTED   Candida parapsilosis NOT DETECTED NOT DETECTED   Candida tropicalis NOT DETECTED NOT DETECTED   Cryptococcus neoformans/gattii NOT DETECTED NOT DETECTED   CTX-M ESBL NOT DETECTED NOT DETECTED   Carbapenem resistance IMP NOT DETECTED NOT DETECTED   Carbapenem resistance KPC NOT DETECTED NOT DETECTED   Carbapenem resistance NDM NOT DETECTED NOT DETECTED   Carbapenem resist OXA 48 LIKE NOT DETECTED NOT DETECTED   Carbapenem resistance VIM NOT DETECTED NOT DETECTED    Mitchell Dixon, PharmD, BCPS, BCIDP Work Cell: [phone] 05/19/2022 1:10 PM

## 2022-05-19 NOTE — ED Notes (Signed)
Lab phlebotomy at bedside 

## 2022-05-19 NOTE — ED Notes (Signed)
Lab personnel/phlebotomy collected 2nd set of blood cultures following previous  administration of antibiotics

## 2022-05-19 NOTE — Consult Note (Signed)
[facility]  CONSULT NOTE    Date: 05/19/2022                  Patient Name:  Mitchell Dixon  MRN: [id number]  DOB: [birth year]  Age / Sex: 67 y.o., male         PCP: Donnie Coffin, MD                 Service Requesting Consult: [place]                 Reason for Consult: Acute kidney injury            History of Present Illness: [name] is a 67 y.o.  male with past medical conditions including atrial fibrillation on Eliquis, pacemaker, spinal cord ischemia, PAD, and hypertension, who was admitted to [facility] on 05/20/2022 for Urinary retention [R33.9] Atrial fibrillation with RVR ([place]) [I48.91] Severe sepsis ([place]) [A41.9, R65.20] Urinary tract infection with hematuria, site unspecified [N39.0, R31.9] Sepsis, due to unspecified organism, unspecified whether acute organ dysfunction present [facility]) [A41.9]  Patient reports to the emergency room with complaints of abdominal swelling and no urine output.  Patient seen and evaluated lying on stretcher in ED.  Patient states he has been unable to void for about 1 day.  Also complains of abdominal distention.  Reports normal appetite, denies nausea, vomiting, or diarrhea.  Remains on room air, denies shortness of breath.  No lower extremity edema  Labs on ED arrival include sodium 130, potassium 3.0, serum bicarb 11, glucose 129, BUN 62, creatinine 5.11 with GFR 12, BMP 266, troponin 52, and elevated WBCs 25.7.  UA appears turbid with some hematuria, leukocytes, and bacteria.  Blood cultures positive for Enterobacterales and Klebsiella.  Chest x-ray shows no acute findings.  Renal ultrasound shows severely atrophic right kidney, left-sided upper pole renal stone.   Medications: Outpatient medications: Medications Prior to Admission  Medication Sig Dispense Refill Last Dose   atorvastatin (LIPITOR) 40 MG tablet Take 40 mg by mouth every evening.   05/17/2022 at 1900   ELIQUIS 5 MG TABS tablet Take 5  mg by mouth 2 (two) times daily.   05/09/2022 at 0700   metoprolol tartrate (LOPRESSOR) 50 MG tablet Take 50 mg by mouth 2 (two) times daily.   05/16/2022 at 0700   budesonide (PULMICORT) 0.25 MG/2ML nebulizer solution Take 2 mLs (0.25 mg total) by nebulization every 6 (six) hours. (Patient not taking: Reported on 03/21/2022) 60 mL 12    ipratropium-albuterol (DUONEB) 0.5-2.5 (3) MG/3ML SOLN Take 3 mLs by nebulization every 6 (six) hours. (Patient not taking: Reported on 03/21/2022) 360 mL      Current medications: Current Facility-Administered Medications  Medication Dose Route Frequency Provider Last Rate Last Admin   acetaminophen (TYLENOL) tablet 650 mg  650 mg Oral Q6H PRN Cox, Amy N, DO       Or   acetaminophen (TYLENOL) suppository 650 mg  650 mg Rectal Q6H PRN Cox, Amy N, DO       apixaban (ELIQUIS) tablet 5 mg  5 mg Oral BID Cox, Amy N, DO   5 mg at 05/19/22 0955   ceFEPIme (MAXIPIME) 2 g in sodium chloride 0.9 % 100 mL IVPB  2 g Intravenous Q24H Cox, Amy N, DO       lactated ringers infusion   Intravenous Continuous Cox, Amy N, DO 150 mL/hr at 05/19/22 0705 New Bag at 05/19/22 0705   metoprolol tartrate (  LOPRESSOR) tablet 50 mg  50 mg Oral BID Cox, Amy N, DO   50 mg at 05/19/22 0955   ondansetron (ZOFRAN) tablet 4 mg  4 mg Oral Q6H PRN Cox, Amy N, DO       Or   ondansetron (ZOFRAN) injection 4 mg  4 mg Intravenous Q6H PRN Cox, Amy N, DO       senna-docusate (Senokot-S) tablet 1 tablet  1 tablet Oral QHS PRN Cox, Amy N, DO          Allergies: No Known Allergies    Past Medical History: Past Medical History:  Diagnosis Date   Ascending aortic dissection ([place])    a. 01/2017 s/p emergent repair @ [facility].   Cardiac pacemaker in situ    a. 03/2017 s/p MDT [place] (ser# [id number]).   Chronic HFrEF (heart failure with reduced ejection fraction) ([place])    a. 06/2016 Echo: EF 45%, diff HK, GrI DD; b. 03/2017 Echo: EF 55-60%, apical wma, Gr2 DD; c. 06/2017 Echo: EF 55-60%, apical,  apical inf, and mid antsept wma; d. 07/2018 Echo: EF 45%, apical thinning & AK. Dil Asc Ao.   Essential hypertension    LV (left ventricular) mural thrombus    a. 06/2016 Echo: EF 45%, diff HK w/ thrombus (0.9 x 0.9 x.0.7cm), GrI DD, mild AI/MR.   Mixed hyperlipidemia    PAD (peripheral artery disease) (HCC)    PAF (paroxysmal atrial fibrillation) (HCC)    a. CHA2DS2VASc = 5-->chronic eliquis.   Paraplegia ([place])    a.  Spinal cord ischemia and injury in the setting of aortic dissection in December 2018.   Stroke [facility]) 06/2016   Urinary incontinence    a. in setting of paraplegia - self catheterizes.     Past Surgical History: Past Surgical History:  Procedure Laterality Date   DIALYSIS/PERMA CATHETER INSERTION N/A 02/25/2017   Procedure: DIALYSIS/PERMA CATHETER INSERTION;  Surgeon: Algernon Huxley, MD;  Location: [facility];  Service: Cardiovascular;  Laterality: N/A;   EMBOLECTOMY Right 02/23/2017   Procedure: EMBOLECTOMY;  Surgeon: Katha Cabal, MD;  Location: [facility] ORS;  Service: Vascular;  Laterality: Right;   FASCIECTOMY Right 02/23/2017   Procedure: FASCIECTOMY;  Surgeon: Katha Cabal, MD;  Location: [facility] ORS;  Service: Vascular;  Laterality: Right;   HERNIA REPAIR       Family History: Family History  Problem Relation Age of Onset   Heart failure Mother    Dementia Mother    Hypertension Mother    Pneumonia Father    Heart failure Sister    Hypertension Sister      Social History: Social History   Socioeconomic History   Marital status: Single    Spouse name: Not on file   Number of children: Not on file   Years of education: Not on file   Highest education level: Not on file  Occupational History   Not on file  Tobacco Use   Smoking status: Every Day    Packs/day: 0.25    Years: 30.00    Additional pack years: 0.00    Total pack years: 7.50    Types: Cigarettes   Smokeless tobacco: Never  Substance and Sexual Activity   Alcohol use:  No   Drug use: No   Sexual activity: Not Currently  Other Topics Concern   Not on file  Social History Narrative   Lives locally.  Mostly bedridden in setting of hypotension and presyncope/syncope when sitting upright.  Social Determinants of Health   Financial Resource Strain: Not on file  Food Insecurity: Not on file  Transportation Needs: Not on file  Physical Activity: Not on file  Stress: Not on file  Social Connections: Not on file  Intimate Partner Violence: Not on file     Review of Systems: Review of Systems  Constitutional:  Negative for chills, fever and malaise/fatigue.  HENT:  Negative for congestion, sore throat and tinnitus.   Eyes:  Negative for blurred vision and redness.  Respiratory:  Negative for cough, shortness of breath and wheezing.   Cardiovascular:  Negative for chest pain, palpitations, claudication and leg swelling.  Gastrointestinal:  Negative for abdominal pain, blood in stool, diarrhea, nausea and vomiting.  Genitourinary:  Positive for dysuria. Negative for flank pain, frequency and hematuria.  Musculoskeletal:  Negative for back pain, falls and myalgias.  Skin:  Negative for rash.  Neurological:  Negative for dizziness, weakness and headaches.  Endo/Heme/Allergies:  Does not bruise/bleed easily.  Psychiatric/Behavioral:  Negative for depression. The patient is not nervous/anxious and does not have insomnia.     Vital Signs: Blood pressure 125/86, pulse 91, temperature 98.2 F (36.8 C), temperature source Oral, resp. rate 16, height 5\' 10"  (1.778 m), weight 77.6 kg, SpO2 100 %.  Weight trends: Filed Weights   05/09/2022 1943  Weight: 77.6 kg    Physical Exam: General: NAD  Head: Normocephalic, atraumatic. Moist oral mucosal membranes  Eyes: Anicteric  Lungs:  Clear to auscultation, normal effort  Heart: Regular rate and rhythm  Abdomen:  Soft, nontender  Extremities: No peripheral edema.  Neurologic: Alert and oriented, moving all  four extremities  Skin: No lesions  Access: None  GU-Foley catheter   Lab results: Basic Metabolic Panel: Recent Labs  Lab 05/07/2022 2009 05/19/22 0052 05/19/22 0930  NA 130*  --  129*  K 3.0*  --  3.2*  CL 92*  --  102  CO2 11*  --  13*  GLUCOSE 129*  --  69*  BUN 62*  --  63*  CREATININE 5.11*  --  4.22*  CALCIUM 8.8*  --  7.5*  MG  --  1.6* 1.8    Liver Function Tests: Recent Labs  Lab 05/01/2022 1945  AST 64*  ALT 27  ALKPHOS 145*  BILITOT 1.8*  PROT 7.5  ALBUMIN 2.6*   No results for input(s): "LIPASE", "AMYLASE" in the last 168 hours. No results for input(s): "AMMONIA" in the last 168 hours.  CBC: Recent Labs  Lab 05/12/2022 2009 05/19/22 0930  WBC 25.7* 25.0*  HGB 13.3 11.7*  HCT 42.7 36.5*  MCV 95.5 93.4  PLT 273 195    Cardiac Enzymes: No results for input(s): "CKTOTAL", "CKMB", "CKMBINDEX", "TROPONINI" in the last 168 hours.  BNP: Invalid input(s): "POCBNP"  CBG: No results for input(s): "GLUCAP" in the last 168 hours.  Microbiology: Results for orders placed or performed during the hospital encounter of 05/23/2022  Culture, blood (Routine X 2) w Reflex to ID Panel     Status: None (Preliminary result)   Collection Time: 05/01/2022 10:41 PM   Specimen: BLOOD  Result Value Ref Range Status   Specimen Description BLOOD RIGHT FOREARM  Final   Special Requests   Final    BOTTLES DRAWN AEROBIC AND ANAEROBIC Blood Culture results may not be optimal due to an inadequate volume of blood received in culture bottles   Culture  Setup Time   Final    GRAM NEGATIVE RODS IN  BOTH AEROBIC AND ANAEROBIC BOTTLES Organism ID to follow CRITICAL RESULT CALLED TO, READ BACK BY AND VERIFIED WITHAubery Lapping [id number] 05/19/22 HNM Performed at [facility], [address] [place], [place] [address]    Culture GRAM NEGATIVE RODS  Final   Report Status PENDING  Incomplete  Blood Culture ID Panel (Reflexed)     Status: Abnormal   Collection Time:  05/12/2022 10:41 PM  Result Value Ref Range Status   Enterococcus faecalis NOT DETECTED NOT DETECTED Final   Enterococcus Faecium NOT DETECTED NOT DETECTED Final   Listeria monocytogenes NOT DETECTED NOT DETECTED Final   Staphylococcus species NOT DETECTED NOT DETECTED Final   Staphylococcus aureus (BCID) NOT DETECTED NOT DETECTED Final   Staphylococcus epidermidis NOT DETECTED NOT DETECTED Final   Staphylococcus lugdunensis NOT DETECTED NOT DETECTED Final   Streptococcus species NOT DETECTED NOT DETECTED Final   Streptococcus agalactiae NOT DETECTED NOT DETECTED Final   Streptococcus pneumoniae NOT DETECTED NOT DETECTED Final   Streptococcus pyogenes NOT DETECTED NOT DETECTED Final   A.calcoaceticus-baumannii NOT DETECTED NOT DETECTED Final   Bacteroides fragilis NOT DETECTED NOT DETECTED Final   Enterobacterales DETECTED (A) NOT DETECTED Final    Comment: Enterobacterales represent a large order of gram negative bacteria, not a single organism. CRITICAL RESULT CALLED TO, READ BACK BY AND VERIFIED WITH: JUSTIN MILLER [id number] 05/19/22 HNM    Enterobacter cloacae complex NOT DETECTED NOT DETECTED Final   Escherichia coli NOT DETECTED NOT DETECTED Final   Klebsiella aerogenes DETECTED (A) NOT DETECTED Final    Comment: CRITICAL RESULT CALLED TO, READ BACK BY AND VERIFIED WITH: JUSTIN MILLER [id number] 05/19/22 HNM    Klebsiella oxytoca NOT DETECTED NOT DETECTED Final   Klebsiella pneumoniae NOT DETECTED NOT DETECTED Final   Proteus species NOT DETECTED NOT DETECTED Final   Salmonella species NOT DETECTED NOT DETECTED Final   Serratia marcescens NOT DETECTED NOT DETECTED Final   Haemophilus influenzae NOT DETECTED NOT DETECTED Final   Neisseria meningitidis NOT DETECTED NOT DETECTED Final   Pseudomonas aeruginosa NOT DETECTED NOT DETECTED Final   Stenotrophomonas maltophilia NOT DETECTED NOT DETECTED Final   Candida albicans NOT DETECTED NOT DETECTED Final   Candida auris NOT  DETECTED NOT DETECTED Final   Candida glabrata NOT DETECTED NOT DETECTED Final   Candida krusei NOT DETECTED NOT DETECTED Final   Candida parapsilosis NOT DETECTED NOT DETECTED Final   Candida tropicalis NOT DETECTED NOT DETECTED Final   Cryptococcus neoformans/gattii NOT DETECTED NOT DETECTED Final   CTX-M ESBL NOT DETECTED NOT DETECTED Final   Carbapenem resistance IMP NOT DETECTED NOT DETECTED Final   Carbapenem resistance KPC NOT DETECTED NOT DETECTED Final   Carbapenem resistance NDM NOT DETECTED NOT DETECTED Final   Carbapenem resist OXA 48 LIKE NOT DETECTED NOT DETECTED Final   Carbapenem resistance VIM NOT DETECTED NOT DETECTED Final    Comment: Performed at [facility], [address] [place], [place] [address]  Culture, blood (Routine X 2) w Reflex to ID Panel     Status: None (Preliminary result)   Collection Time: 05/19/22  3:54 AM   Specimen: BLOOD  Result Value Ref Range Status   Specimen Description BLOOD LEFT HAND  Final   Special Requests   Final    BOTTLES DRAWN AEROBIC AND ANAEROBIC Blood Culture results may not be optimal due to an inadequate volume of blood received in culture bottles   Culture   Final    NO GROWTH <  12 HOURS Performed at [facility] [address] [place], [place] [address]    Report Status PENDING  Incomplete    Coagulation Studies: Recent Labs    05/19/22 0328  LABPROT 28.4*  INR 2.7*    Urinalysis: Recent Labs    05/17/2022 2019  COLORURINE YELLOW*  LABSPEC 1.008  PHURINE 7.0  GLUCOSEU NEGATIVE  HGBUR LARGE*  BILIRUBINUR NEGATIVE  KETONESUR NEGATIVE  PROTEINUR 100*  NITRITE NEGATIVE  LEUKOCYTESUR LARGE*      Imaging: US RENAL  Result Date: 05/19/2022 CLINICAL DATA:  Acute kidney insufficiency EXAM: RENAL / URINARY TRACT ULTRASOUND COMPLETE COMPARISON:  CT 03/20/2022 FINDINGS: Right Kidney: Renal measurements: 2.6 x 1.8 x 1.8 = volume: 4 mL. Extremely echogenic and atrophic. No collecting system  dilatation. This was seen on the prior CT. Left Kidney: Renal measurements: 12.2 x 7.0 x 5.8 = volume: 260 mL. No collecting system dilatation. No perinephric fluid. Echogenic shadowing area identified consistent with known stone in the upper pole measuring 11 mm by ultrasound. Larger by CT at 13 mm. Bladder: Underdistended. Other: None. IMPRESSION: Severely atrophic right kidney. Upper pole left-sided renal stone.  No collecting system dilatation. Electronically Signed   By: Jill Side M.D.   On: 05/19/2022 12:05   DG Chest 1 View  Result Date: 05/02/2022 CLINICAL DATA:  Atrial fibrillation. EXAM: CHEST  1 VIEW COMPARISON:  03/20/2022 FINDINGS: Prior median sternotomy. The heart is normal in size. 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 Lead less pacemaker in place. No pulmonary edema, pleural effusion, or pneumothorax. On limited assessment, no acute osseous finding. IMPRESSION: No acute findings. Electronically Signed   By: Keith Rake M.D.   On: 05/21/2022 20:55     Assessment & Plan: [name] is a 67 y.o.  male with past medical conditions including atrial fibrillation on Eliquis, pacemaker, spinal cord ischemia, PAD, and hypertension, who was admitted to [facility] on 05/16/2022 for Urinary retention [R33.9] Atrial fibrillation with RVR ([place]) [I48.91] Severe sepsis ([place]) [A41.9, R65.20] Urinary tract infection with hematuria, site unspecified [N39.0, R31.9] Sepsis, due to unspecified organism, unspecified whether acute organ dysfunction present ([place]) [A41.9]   Acute kidney injury likely secondary to bladder outlet obstruction with infection.  Foley catheter placed by ED staff.  UA positive for UTI.  No IV contrast exposure.  Suspect renal function to continue to improve with treatment of underlying cause.  Agree with IV fluids for now.  Patient encouraged to maintain oral intake.  No indication for  dialysis at this time.  Continue to avoid nephrotoxic agents and therapies.  Will continue to monitor.  2.  Hyponatremia/hypokalemia.  Likely secondary to kidney injury.  Continue supplementation as needed.  3.  Acute metabolic acidosis.  Serum bicarb 11 on admission.  Patient ordered sodium bicarbonate tablets 650 mg twice daily.  4.  Hypertension, essential.  Home regimen includes metoprolol.  Currently receiving this medication.  LOS: 1 Sukanya Goldblatt 3/20/20242:16 PM

## 2022-05-19 NOTE — ED Notes (Signed)
Provider Foust notified pt has audible expiratory wheezing with no changes in respiratory effort.  Pt 98% O2 sat on 3L nasal cannula

## 2022-05-19 NOTE — ED Notes (Signed)
Foust, NP notified regarding pt Troponin: 112

## 2022-05-19 NOTE — Progress Notes (Signed)
Elink monitoring for the code sepsis protocol.  

## 2022-05-19 NOTE — Progress Notes (Signed)
Progress Note   Patient: Mitchell Dixon [id number] DOB: 11-Dec-1955 DOA: 05/24/2022     1 DOS: the patient was seen and examined on 05/19/2022   Brief hospital course: Mr. Chrles Bucio is a 67 year old male with history of aortic dissection, history of paraplegia secondary to spinal cord ischemia, PAD, hypertension, atrial fibrillation on Eliquis, pacemaker placement, who presents emergency department for chief concerns of abdominal distention and no urine output for 1 day.  Patient was found to be in atrial fibrillation with RVR.  Vitals in the ED showed temperature of 97.3, respiration rate of 39, heart rate 179, blood pressure 117/105, SpO2 of 95% on room air, and then decreased to 85% and was placed on 3 L nasal cannula with SpO2 of 95%.  Serum sodium is 130, potassium 3.0, chloride 92, bicarb 11, BUN of 62, serum creatinine of 5.11, EGFR of 12, nonfasting blood glucose 129, lactic acid 8.1, BNP elevated at 266.3, high sensitive troponin was elevated at 52.  UA with hematuria, pyuria  ED treatment: Cefepime, diltiazem, sodium chloride 2 L bolus, sodium chloride 500 mL bolus, diltiazem GGT.  3/20: Vital stable with mild tachypnea.  Preliminary blood cultures growing Klebsiella atherogenes.  Troponin peaked at 165 and then trending down.  Concern of obstructive uropathy, nephrology was also consulted. Also concern of neurogenic bladder, patient self catheterize since discharge from the hospital in January 2024.  Assessment and Plan: * Severe sepsis ([place]) Without septic shock at this time Patient had elevated respiration rate, heart rate, atrial fibrillation with RVR, organ involvement is renal and cardiac, suspected source is urine.  Blood cultures with Klebsiella erogenous, urine cultures pending. Severe sepsis with AKI and significantly elevated lactic acid which now trending down.  Received IV fluid and broad-spectrum antibiotics. -Continue with cefepime -Stop  vancomycin -Follow-up final culture results  AKI (acute kidney injury) ([place]) I suspect this is secondary to obstructive acute kidney injury, complicated by urinary tract infection Patient has Foley placement and per nursing urine output is purulence and cloudy Nephrology was also consulted. -Monitor renal function  Hypokalemia Also found to have hypomagnesemia with magnesium at 1.6. -Replete electrolytes and monitor  Neurogenic bladder Patient self cath since discharge from the hospital  Metabolic acidosis Presumed secondary to acute kidney injury Sodium bicarbonate tablet 650 mg p.o. twice daily,   Hyponatremia Sodium with further decreased to 129 after getting IV fluid.  Elevated serum osmolality at 300 with normal urine osmolality and urine sodium of 87. Most likely with AKI -Fluid restriction -Monitor sodium  Elevated troponin I suspect this is secondary to demand ischemia in setting of atrial fibrillation with RVR as patient denies chest pain and shortness of breath. Peaked at 165 and now trending down  Atrial fibrillation with rapid ventricular response (HCC) Continue diltiazem gtt. Resumed metoprolol home dosing of 50 mg p.o. twice daily as patient is minimally improving on diltiazem gtt. Given that his home rate control is a beta-blockade, if his heart rate is less than 120 for about 30 minutes, please stop diltiazem gtt.  Hypertension Metoprolol tartrate 50 mg p.o. twice daily resumed  Hyperlipidemia Liver function tests ordered before atorvastatin 40 mg every evening can be resumed   Subjective: Patient was complaining of abdominal pain.  Appetite remained poor with mild nausea but no vomiting.  Physical Exam: Vitals:   05/19/22 1230 05/19/22 1300 05/19/22 1330 05/19/22 1403  BP: (!) 148/109 132/87 125/76 125/86  Pulse: 88 91 84 91  Resp: (!) 28 (!) 23  18 16  Temp:    98.2 F (36.8 C)  TempSrc:    Oral  SpO2: 100% 100% (!) 81% 100%  Weight:       Height:       General.  Chronically ill-appearing gentleman, in no acute distress. Pulmonary.  Lungs clear bilaterally, normal respiratory effort. CV.  Regular rate and rhythm, no JVD, rub or murmur. Abdomen.  Soft, nontender, nondistended, BS positive. CNS.  Alert and oriented .  Paraplegic Extremities.  No edema, no cyanosis, pulses intact and symmetrical. Psychiatry.  Judgment and insight appears normal.   Data Reviewed: Prior data reviewed  Family Communication:   Disposition: Status is: Inpatient Remains inpatient appropriate because: Severity of illness  Planned Discharge Destination: Home  DVT prophylaxis.  Eliquis Time spent: 50 minutes  This record has been created using Systems analyst. Errors have been sought and corrected,but may not always be located. Such creation errors do not reflect on the standard of care.   Author: Lorella Nimrod, MD 05/19/2022 5:49 PM  For on call review [email or web address].

## 2022-05-19 NOTE — ED Notes (Signed)
Lab/phlebotomy to assist with obtaining 2nd set of blood cultures

## 2022-05-20 DIAGNOSIS — I4891 Unspecified atrial fibrillation: Secondary | ICD-10-CM | POA: Diagnosis not present

## 2022-05-20 DIAGNOSIS — N210 Calculus in bladder: Secondary | ICD-10-CM | POA: Diagnosis not present

## 2022-05-20 DIAGNOSIS — N319 Neuromuscular dysfunction of bladder, unspecified: Secondary | ICD-10-CM | POA: Diagnosis not present

## 2022-05-20 DIAGNOSIS — N4889 Other specified disorders of penis: Secondary | ICD-10-CM

## 2022-05-20 DIAGNOSIS — N390 Urinary tract infection, site not specified: Secondary | ICD-10-CM | POA: Diagnosis not present

## 2022-05-20 DIAGNOSIS — R319 Hematuria, unspecified: Secondary | ICD-10-CM

## 2022-05-20 DIAGNOSIS — R600 Localized edema: Secondary | ICD-10-CM

## 2022-05-20 DIAGNOSIS — N5089 Other specified disorders of the male genital organs: Secondary | ICD-10-CM

## 2022-05-20 DIAGNOSIS — R310 Gross hematuria: Secondary | ICD-10-CM | POA: Diagnosis not present

## 2022-05-20 DIAGNOSIS — A419 Sepsis, unspecified organism: Secondary | ICD-10-CM | POA: Diagnosis not present

## 2022-05-20 DIAGNOSIS — N261 Atrophy of kidney (terminal): Secondary | ICD-10-CM

## 2022-05-20 DIAGNOSIS — R339 Retention of urine, unspecified: Secondary | ICD-10-CM | POA: Diagnosis not present

## 2022-05-20 DIAGNOSIS — N179 Acute kidney failure, unspecified: Secondary | ICD-10-CM | POA: Diagnosis not present

## 2022-05-20 DIAGNOSIS — G8221 Paraplegia, complete: Secondary | ICD-10-CM | POA: Diagnosis not present

## 2022-05-20 LAB — GLUCOSE, CAPILLARY
Glucose-Capillary: 101 mg/dL — ABNORMAL HIGH (ref 70–99)
Glucose-Capillary: 104 mg/dL — ABNORMAL HIGH (ref 70–99)
Glucose-Capillary: 119 mg/dL — ABNORMAL HIGH (ref 70–99)
Glucose-Capillary: 77 mg/dL (ref 70–99)
Glucose-Capillary: 93 mg/dL (ref 70–99)

## 2022-05-20 LAB — LACTIC ACID, PLASMA: Lactic Acid, Venous: 1.4 mmol/L (ref 0.5–1.9)

## 2022-05-20 LAB — CBC
HCT: 33.1 % — ABNORMAL LOW (ref 39.0–52.0)
Hemoglobin: 10.8 g/dL — ABNORMAL LOW (ref 13.0–17.0)
MCH: 29.7 pg (ref 26.0–34.0)
MCHC: 32.6 g/dL (ref 30.0–36.0)
MCV: 90.9 fL (ref 80.0–100.0)
Platelets: 191 10*3/uL (ref 150–400)
RBC: 3.64 MIL/uL — ABNORMAL LOW (ref 4.22–5.81)
RDW: 15.1 % (ref 11.5–15.5)
WBC: 19 10*3/uL — ABNORMAL HIGH (ref 4.0–10.5)
nRBC: 0 % (ref 0.0–0.2)

## 2022-05-20 LAB — BASIC METABOLIC PANEL
Anion gap: 15 (ref 5–15)
BUN: 65 mg/dL — ABNORMAL HIGH (ref 8–23)
CO2: 13 mmol/L — ABNORMAL LOW (ref 22–32)
Calcium: 7.6 mg/dL — ABNORMAL LOW (ref 8.9–10.3)
Chloride: 98 mmol/L (ref 98–111)
Creatinine, Ser: 4.55 mg/dL — ABNORMAL HIGH (ref 0.61–1.24)
GFR, Estimated: 14 mL/min — ABNORMAL LOW (ref >60)
Glucose, Bld: 50 mg/dL — ABNORMAL LOW (ref 70–99)
Potassium: 3 mmol/L — ABNORMAL LOW (ref 3.5–5.1)
Sodium: 126 mmol/L — ABNORMAL LOW (ref 135–145)

## 2022-05-20 MED ORDER — KCL IN DEXTROSE-NACL 20-5-0.9 MEQ/L-%-% IV SOLN
INTRAVENOUS | Status: DC
  Filled 2022-05-20 (×3): qty 1000

## 2022-05-20 MED ORDER — POTASSIUM CHLORIDE IN NACL 20-0.9 MEQ/L-% IV SOLN
INTRAVENOUS | Status: DC
  Filled 2022-05-20: qty 1000

## 2022-05-20 MED ORDER — SODIUM CHLORIDE 0.9 % IV SOLN: INTRAVENOUS | Status: DC

## 2022-05-20 NOTE — Progress Notes (Signed)
Patients urine color dark red in color with more clots, MD made aware of

## 2022-05-20 NOTE — Assessment & Plan Note (Signed)
Presumed secondary to acute kidney injury Sodium bicarbonate tablet 650 mg p.o. twice daily,

## 2022-05-20 NOTE — Assessment & Plan Note (Signed)
Patient self cath since discharge from the hospital Currently with Foley catheter in place

## 2022-05-20 NOTE — Progress Notes (Signed)
Hypoglycemic Event  CBG: 50  Treatment: 8 oz juice/soda  Symptoms: None  Follow-up CBG: Time:1017 CBG Result:77  Possible Reasons for Event: Inadequate meal intake  Comments/MD notified:MD notified, started on D5 and NS with 76mEq Kcl at 170ml/hr, started Q4 sugar checks.    Nat Lowenthal

## 2022-05-20 NOTE — Assessment & Plan Note (Signed)
Sodium with further decreased to 126 .  Elevated serum osmolality at 300 with normal urine osmolality and urine sodium of 87. Most likely with AKI -Continue IV fluid -Monitor sodium -Nephrology is on board

## 2022-05-20 NOTE — Consult Note (Addendum)
NAME: Mitchell Dixon  DOB: [birth year]  MRN: [id number]  Date/Time: 05/20/2022 2:17 PM  REQUESTING PROVIDER: Dr.Amin Subjective:  REASON FOR CONSULT: bacteremia ? Mitchell Dixon is a 67 y.o. 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  He lives at home with his brother and sister  05/15/2022  BP 138/98 (H) 138/98 (H)  Temp 97.3 F (36.3 C) !  Pulse Rate 129 !  Resp 27 !  SpO2 95 %  O2 Flow Rate (L/min) 3 L/min  Weight 171 lb    Latest Reference Range & Units 05/11/2022  WBC 4.0 - 10.5 K/uL 25.7 (H)  Hemoglobin 13.0 - 17.0 g/dL 13.3  HCT 39.0 - 52.0 % 42.7  Platelets 150 - 400 K/uL 273  Creatinine 0.61 - 1.24 mg/dL 5.11 (H)   Pt was in Afib RVR on admission  Bladder scan revealed > 1000cc- but on straight cath only blood clots were removed. Blood culture and urine culture sent He was seen by urologist who asked to hold eliquis if possible  Am seeing patient for enterobacter in blood Pt was hospitalized in Jan 2024 and at that time had providencia and clostridium in blood culture and  source was unclear but  thought to be due to self straight cath   Past Medical History:  Diagnosis Date   Ascending aortic dissection ([place])    a. 01/2017 s/p emergent repair @ [facility].   Cardiac pacemaker in situ    a. 03/2017 s/p MDT [place] (ser# [id number]).   Chronic HFrEF (heart failure with reduced ejection fraction) ([place])    a. 06/2016 Echo: EF 45%, diff HK, GrI DD; b. 03/2017 Echo: EF 55-60%, apical wma, Gr2 DD; c. 06/2017 Echo: EF 55-60%, apical, apical inf, and mid antsept wma; d. 07/2018 Echo: EF 45%, apical thinning & AK. Dil Asc Ao.   Essential hypertension     LV (left ventricular) mural thrombus    a. 06/2016 Echo: EF 45%, diff HK w/ thrombus (0.9 x 0.9 x.0.7cm), GrI DD, mild AI/MR.   Mixed hyperlipidemia    PAD (peripheral artery disease) (HCC)    PAF (paroxysmal atrial fibrillation) (HCC)    a. CHA2DS2VASc = 5-->chronic eliquis.   Paraplegia ([place])    a.  Spinal cord ischemia and injury in the setting of aortic dissection in December 2018.   Stroke [facility]) 06/2016   Urinary incontinence    a. in setting of paraplegia - self catheterizes.    Past Surgical History:  Procedure Laterality Date   DIALYSIS/PERMA CATHETER INSERTION N/A 02/25/2017   Procedure: DIALYSIS/PERMA CATHETER INSERTION;  Surgeon: Algernon Huxley, MD;  Location: [place] [facility];  Service: Cardiovascular;  Laterality: N/A;   EMBOLECTOMY Right 02/23/2017   Procedure: EMBOLECTOMY;  Surgeon: Katha Cabal, MD;  Location: [facility];  Service: Vascular;  Laterality: Right;   FASCIECTOMY Right 02/23/2017   Procedure: FASCIECTOMY;  Surgeon: Katha Cabal, MD;  Location: [facility];  Service: Vascular;  Laterality: Right;   HERNIA REPAIR      Social History   Socioeconomic History   Marital status: Single    Spouse name: Not on file   Number of children: Not on file   Years of education: Not on file  Highest education level: Not on file  Occupational History   Not on file  Tobacco Use   Smoking status: Every Day    Packs/day: 0.25    Years: 30.00    Additional pack years: 0.00    Total pack years: 7.50    Types: Cigarettes   Smokeless tobacco: Never  Substance and Sexual Activity   Alcohol use: No   Drug use: No   Sexual activity: Not Currently  Other Topics Concern   Not on file  Social History Narrative   Lives locally.  Mostly bedridden in setting of hypotension and presyncope/syncope when sitting upright.   Social Determinants of Health   Financial Resource Strain: Not on file  Food Insecurity: No Food Insecurity (05/19/2022)   Hunger Vital Sign     Worried About Running Out of Food in the Last Year: Never true    Ran Out of Food in the Last Year: Never true  Transportation Needs: No Transportation Needs (05/19/2022)   PRAPARE - Hydrologist (Medical): No    Lack of Transportation (Non-Medical): No  Physical Activity: Not on file  Stress: Not on file  Social Connections: Not on file  Intimate Partner Violence: Not At Risk (05/19/2022)   Humiliation, Afraid, Rape, and Kick questionnaire    Fear of Current or Ex-Partner: No    Emotionally Abused: No    Physically Abused: No    Sexually Abused: No    Family History  Problem Relation Age of Onset   Heart failure Mother    Dementia Mother    Hypertension Mother    Pneumonia Father    Heart failure Sister    Hypertension Sister    No Known Allergies I? Current Facility-Administered Medications  Medication Dose Route Frequency Provider Last Rate Last Admin   acetaminophen (TYLENOL) tablet 650 mg  650 mg Oral Q6H PRN Cox, Amy N, DO       Or   acetaminophen (TYLENOL) suppository 650 mg  650 mg Rectal Q6H PRN Cox, Amy N, DO       apixaban (ELIQUIS) tablet 5 mg  5 mg Oral BID Cox, Amy N, DO   5 mg at 05/20/22 0951   ceFEPIme (MAXIPIME) 2 g in sodium chloride 0.9 % 100 mL IVPB  2 g Intravenous Q24H Cox, Amy N, DO   Stopped at 05/19/22 2218   Chlorhexidine Gluconate Cloth 2 % PADS 6 each  6 each Topical Daily Lorella Nimrod, MD   6 each at 05/20/22 1028   dextrose 5 % and 0.9 % NaCl with KCl 20 mEq/L infusion   Intravenous Continuous Kolluru, Sarath, MD 100 mL/hr at 05/20/22 1130 Infusion Verify at 05/20/22 1130   ipratropium-albuterol (DUONEB) 0.5-2.5 (3) MG/3ML nebulizer solution 3 mL  3 mL Nebulization Q6H PRN Lorella Nimrod, MD       levalbuterol (XOPENEX) nebulizer solution 0.63 mg  0.63 mg Nebulization Q6H PRN Lorella Nimrod, MD   0.63 mg at 05/19/22 1847   metoprolol tartrate (LOPRESSOR) tablet 50 mg  50 mg Oral BID Cox, Amy N, DO   50 mg at 05/20/22 0952    ondansetron (ZOFRAN) tablet 4 mg  4 mg Oral Q6H PRN Cox, Amy N, DO       Or   ondansetron (ZOFRAN) injection 4 mg  4 mg Intravenous Q6H PRN Cox, Amy N, DO       senna-docusate (Senokot-S) tablet 1 tablet  1 tablet Oral QHS PRN Cox, Amy N, DO  sodium bicarbonate tablet 650 mg  650 mg Oral BID Lorella Nimrod, MD   650 mg at 05/20/22 [id number]     Abtx:  Anti-infectives (From admission, onward)    Start     Dose/Rate Route Frequency Ordered Stop   05/19/22 2200  ceFEPIme (MAXIPIME) 2 g in sodium chloride 0.9 % 100 mL IVPB        2 g 200 mL/hr over 30 Minutes Intravenous Every 24 hours 05/17/2022 2246 05/25/22 2159   05/19/22 0000  vancomycin (VANCOREADY) IVPB 1750 mg/350 mL        1,750 mg 175 mL/hr over 120 Minutes Intravenous  Once 05/20/2022 2242 05/19/22 0159   05/23/2022 2242  vancomycin variable dose per unstable renal function (pharmacist dosing)  Status:  Discontinued         Does not apply See admin instructions 05/30/2022 2242 05/19/22 0924   05/04/2022 2230  metroNIDAZOLE (FLAGYL) IVPB 500 mg  Status:  Discontinued        500 mg 100 mL/hr over 60 Minutes Intravenous Every 12 hours 05/11/2022 2227 05/19/22 0924   05/25/2022 2130  ceFEPIme (MAXIPIME) 2 g in sodium chloride 0.9 % 100 mL IVPB        2 g 200 mL/hr over 30 Minutes Intravenous  Once 05/19/2022 2118 05/02/2022 2310       REVIEW OF SYSTEMS:  Const: negative fever, negative chills, negative weight loss Eyes: negative diplopia or visual changes, negative eye pain ENT: negative coryza, negative sore throat Resp: negative cough, hemoptysis, dyspnea Cards: negative for chest pain, palpitations, lower extremity edema GU: as above GI: Negative for abdominal pain, diarrhea, bleeding, constipation Skin: negative for rash and pruritus Heme: negative for easy bruising and gum/nose bleeding [id number]  weakness Neurolo paraplegia  Psych: negative for feelings of anxiety, depression  Endocrine: negative for thyroid,  diabetes Allergy/Immunology- negative for any medication or food allergies  Objective:  VITALS:  BP 137/81 (BP Location: Left Arm)   Pulse 89   Temp 98.4 F (36.9 C)   Resp 15   Ht 5\' 10"  (1.778 m)   Wt 77.6 kg   SpO2 100%   BMI 24.54 kg/m  LDA Foley  PHYSICAL EXAM:  General: Alert, cooperative, chronically ill, pale Head: Normocephalic, without obvious abnormality, atraumatic. Eyes: Conjunctivae clear, anicteric sclerae. Pupils are equal ENT Nares normal. No drainage or sinus tenderness. Lips, mucosa, and tongue normal. No Thrush Neck: Supple, symmetrical, no adenopathy, thyroid: non tender no carotid bruit and no JVD. Back: No CVA tenderness. Lungs: Clear to auscultation bilaterally. No Wheezing or Rhonchi. No rales. Heart: s1s2. Abdomen: Soft, ,not distended. Bowel sounds normal. No masses Penile and scrotal edema Edema legs Foley cath Bloody urine Extremities: severe edema legs  Skin: thickened keloid like lesions over the left lateral thigh in a linear fashion     Lymph: Cervical, supraclavicular normal. Neurologic: paraplegia Pertinent Labs Lab Results CBC    Component Value Date/Time   WBC 19.0 (H) 05/20/2022 0912   RBC 3.64 (L) 05/20/2022 0912   HGB 10.8 (L) 05/20/2022 0912   HGB 13.6 02/17/2017 1909   HCT 33.1 (L) 05/20/2022 0912   HCT 40.7 02/17/2017 1909   PLT 191 05/20/2022 0912   PLT 246 02/17/2017 1909   MCV 90.9 05/20/2022 0912   MCV 95 02/17/2017 1909   MCH 29.7 05/20/2022 0912   MCHC 32.6 05/20/2022 0912   RDW 15.1 05/20/2022 0912   RDW 13.3 02/17/2017 1909   LYMPHSABS 0.8 03/20/2022 1859   MONOABS  0.7 03/20/2022 1859   EOSABS 0.0 03/20/2022 1859   BASOSABS 0.2 (H) 03/20/2022 1859       Latest Ref Rng & Units 05/20/2022    9:12 AM 05/19/2022    9:30 AM 05/08/2022    8:09 PM  CMP  Glucose 70 - 99 mg/dL 50  69  129   BUN 8 - 23 mg/dL 65  63  62   Creatinine 0.61 - 1.24 mg/dL 4.55  4.22  5.11   Sodium 135 - 145 mmol/L 126  129   130   Potassium 3.5 - 5.1 mmol/L 3.0  3.2  3.0   Chloride 98 - 111 mmol/L 98  102  92   CO2 22 - 32 mmol/L 13  13  11    Calcium 8.9 - 10.3 mg/dL 7.6  7.5  8.8       Microbiology: Recent Results (from the past 240 hour(s))  Urine Culture     Status: Abnormal   Collection Time: 05/16/2022  8:19 PM   Specimen: Urine, Catheterized  Result Value Ref Range Status   Specimen Description   Final    URINE, CATHETERIZED Performed at [facility], [address], [place], [place] [address]    Special Requests   Final    NONE Performed at [facility], [address] [place], [place] [address]    Culture MULTIPLE SPECIES PRESENT, SUGGEST RECOLLECTION (A)  Final   Report Status 05/19/2022 FINAL  Final  Culture, blood (Routine X 2) w Reflex to ID Panel     Status: Abnormal (Preliminary result)   Collection Time: 05/15/2022 10:41 PM   Specimen: BLOOD RIGHT FOREARM  Result Value Ref Range Status   Specimen Description   Final    BLOOD RIGHT FOREARM Performed at [facility], [place] [address] [place], [place] [address]    Special Requests   Final    BOTTLES DRAWN AEROBIC AND ANAEROBIC Blood Culture results may not be optimal due to an inadequate volume of blood received in culture bottles Performed at [facility], [address], [place], [place] [address]    Culture  Setup Time   Final    GRAM NEGATIVE RODS IN BOTH AEROBIC AND ANAEROBIC BOTTLES Organism ID to follow CRITICAL RESULT CALLED TO, READ BACK BY AND VERIFIED WITHAubery Lapping [id number] 05/19/22 HNM Performed at [facility], [address], [place], [place] [address]    Culture (A)  Final    ENTEROBACTER AEROGENES CULTURE REINCUBATED FOR BETTER GROWTH Performed at [facility], [place] [address], [place], [place] [address]    Report Status PENDING  Incomplete  Blood Culture ID Panel (Reflexed)     Status: Abnormal   Collection Time: 05/12/2022 10:41 PM  Result Value Ref  Range Status   Enterococcus faecalis NOT DETECTED NOT DETECTED Final   Enterococcus Faecium NOT DETECTED NOT DETECTED Final   Listeria monocytogenes NOT DETECTED NOT DETECTED Final   Staphylococcus species NOT DETECTED NOT DETECTED Final   Staphylococcus aureus (BCID) NOT DETECTED NOT DETECTED Final   Staphylococcus epidermidis NOT DETECTED NOT DETECTED Final   Staphylococcus lugdunensis NOT DETECTED NOT DETECTED Final   Streptococcus species NOT DETECTED NOT DETECTED Final   Streptococcus agalactiae NOT DETECTED NOT DETECTED Final   Streptococcus pneumoniae NOT DETECTED NOT DETECTED Final   Streptococcus pyogenes NOT DETECTED NOT DETECTED Final   A.calcoaceticus-baumannii NOT DETECTED NOT DETECTED Final   Bacteroides fragilis NOT DETECTED NOT DETECTED Final  Enterobacterales DETECTED (A) NOT DETECTED Final    Comment: Enterobacterales represent a large order of gram negative bacteria, not a single organism. CRITICAL RESULT CALLED TO, READ BACK BY AND VERIFIED WITH: JUSTIN MILLER [id number] 05/19/22 HNM    Enterobacter cloacae complex NOT DETECTED NOT DETECTED Final   Escherichia coli NOT DETECTED NOT DETECTED Final   Klebsiella aerogenes DETECTED (A) NOT DETECTED Final    Comment: CRITICAL RESULT CALLED TO, READ BACK BY AND VERIFIED WITH: JUSTIN MILLER [id number] 05/19/22 HNM    Klebsiella oxytoca NOT DETECTED NOT DETECTED Final   Klebsiella pneumoniae NOT DETECTED NOT DETECTED Final   Proteus species NOT DETECTED NOT DETECTED Final   Salmonella species NOT DETECTED NOT DETECTED Final   Serratia marcescens NOT DETECTED NOT DETECTED Final   Haemophilus influenzae NOT DETECTED NOT DETECTED Final   Neisseria meningitidis NOT DETECTED NOT DETECTED Final   Pseudomonas aeruginosa NOT DETECTED NOT DETECTED Final   Stenotrophomonas maltophilia NOT DETECTED NOT DETECTED Final   Candida albicans NOT DETECTED NOT DETECTED Final   Candida auris NOT DETECTED NOT DETECTED Final   Candida  glabrata NOT DETECTED NOT DETECTED Final   Candida krusei NOT DETECTED NOT DETECTED Final   Candida parapsilosis NOT DETECTED NOT DETECTED Final   Candida tropicalis NOT DETECTED NOT DETECTED Final   Cryptococcus neoformans/gattii NOT DETECTED NOT DETECTED Final   CTX-M ESBL NOT DETECTED NOT DETECTED Final   Carbapenem resistance IMP NOT DETECTED NOT DETECTED Final   Carbapenem resistance KPC NOT DETECTED NOT DETECTED Final   Carbapenem resistance NDM NOT DETECTED NOT DETECTED Final   Carbapenem resist OXA 48 LIKE NOT DETECTED NOT DETECTED Final   Carbapenem resistance VIM NOT DETECTED NOT DETECTED Final    Comment: Performed at [facility], [address] [place], [place] [address]  Culture, blood (Routine X 2) w Reflex to ID Panel     Status: None (Preliminary result)   Collection Time: 05/19/22  3:54 AM   Specimen: BLOOD  Result Value Ref Range Status   Specimen Description BLOOD LEFT HAND  Final   Special Requests   Final    BOTTLES DRAWN AEROBIC AND ANAEROBIC Blood Culture results may not be optimal due to an inadequate volume of blood received in culture bottles   Culture   Final    NO GROWTH 1 DAY Performed at [facility], [address] [place], [place] [address]    Report Status PENDING  Incomplete    IMAGING RESULTS: CXR no infiltrate I have personally reviewed the films ? Impression/Recommendation urinary retention-- Due to bleeding and clots ? Complicated UTI due to the above Klebsiella aerogenes bacteremia is likely due to the above On cefepime  Bladder stones multiple AKI Pt had [place] , would recommend CT abdomen to look at the kidney, bladder  Afib with RR- resolved H/o aortic aneursym dissection , needing repair H/o acute ischemic myelitis/spinal infarction causing paraplegia  ? _h/o bacteremia in jan 2024  _has lead less pacemaker _________________________________________________ Discussed with patient, requesting provider Note:   This document was prepared using Dragon voice recognition software and may include unintentional dictation errors.

## 2022-05-20 NOTE — Assessment & Plan Note (Signed)
Concern of developing ATN with obstructive uropathy.  Worsening renal function with worsening electrolytes.  Nephrology is on board.  Becoming oliguric. -Continue IV fluid -Strict intake and output -Avoid nephrotoxins -Monitor renal function

## 2022-05-20 NOTE — Assessment & Plan Note (Signed)
Potassium at 3 today.  Magnesium improved to 1.8 -Replete electrolytes and monitor

## 2022-05-20 NOTE — Progress Notes (Signed)
Progress Note   Patient: Mitchell Dixon [id number] DOB: 1955/08/11 DOA: 05/30/2022     2 DOS: the patient was seen and examined on 05/20/2022   Brief hospital course: Mr. 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  Patient was found to be in atrial fibrillation with RVR.  Vitals in the ED showed temperature of 97.3, respiration rate of 39, heart rate 179, blood pressure 117/105, SpO2 of 95% on room air, and then decreased to 85% and was placed on 3 L nasal cannula with SpO2 of 95%.  Serum sodium is 130, potassium 3.0, chloride 92, bicarb 11, BUN of 62, serum creatinine of 5.11, EGFR of 12, nonfasting blood glucose 129, lactic acid 8.1, BNP elevated at 266.3, high sensitive troponin was elevated at 52.  UA with hematuria, pyuria  ED treatment: Cefepime, diltiazem, sodium chloride 2 L bolus, sodium chloride 500 mL bolus, diltiazem GGT.  3/20: Vital stable with mild tachypnea.  Preliminary blood cultures growing Klebsiella atherogenes.  Troponin peaked at 165 and then trending down.  Concern of obstructive uropathy, nephrology was also consulted. Also concern of neurogenic bladder, patient self catheterize since discharge from the hospital in January 2024.  3/21: Persistent hematuria with concern of some clots now.  Urology was consulted.  Apparently similar presentation on prior hospitalization and CT with atrophic right kidney and left kidney stone, also positive for multiple bladder stones.  He missed outpatient follow-up appointment with urology.  Urology was again consulted today.  ID was also consulted due to recurrent bacteremia.  Worsening renal function, appears oliguric with total UOP recorded was 300, concern of  developing ATN.  Worsening hyponatremia-nephrology is recommending continuation of IV fluid.  Assessment and Plan: * Severe sepsis ([place]) Without septic shock at this time Patient had elevated respiration rate, heart rate, atrial fibrillation with RVR, organ involvement is renal and cardiac, suspected source is urine.  Blood cultures with Klebsiella erogenous, urine cultures with multiple species. Severe sepsis with AKI and significantly elevated lactic acidosis.  Received IV fluid and broad-spectrum antibiotics.  Lactic acidosis improved. ID was also consulted due to recurrent bacteremias -Continue with cefepime -Stop vancomycin -Follow-up final culture results  AKI (acute kidney injury) ([place]) Concern of developing ATN with obstructive uropathy.  Worsening renal function with worsening electrolytes.  Nephrology is on board.  Becoming oliguric. -Continue IV fluid -Strict intake and output -Avoid nephrotoxins -Monitor renal function  Hypokalemia Potassium at 3 today.  Magnesium improved to 1.8 -Replete electrolytes and monitor  Hematuria Patient with history of nephrolithiasis and bladder stone during prior hospitalization and was referred for outpatient urology for definitive treatment.  Apparently he missed his appointment. Persistent hematuria with some clots so urology was consulted. -They are recommending gentle bladder irrigation. -Continue to monitor  Neurogenic bladder Patient self cath since discharge from the hospital Currently with Foley catheter in place  Metabolic acidosis Presumed secondary to acute kidney injury Sodium bicarbonate tablet 650 mg p.o. twice daily,   Hyponatremia Sodium with further decreased to 126 .  Elevated serum osmolality at 300 with normal urine osmolality and urine sodium of 87. Most likely with AKI -Continue IV fluid -Monitor sodium -Nephrology is on board  Elevated troponin I suspect this is secondary to demand ischemia in setting of  atrial fibrillation  with RVR as patient denies chest pain and shortness of breath. Peaked at 165 and now trending down  Atrial fibrillation with rapid ventricular response (H[facility]) Continue diltiazem gtt. Resumed metoprolol home dosing of 50 mg p.o. twice daily as patient is minimally improving on diltiazem gtt. Given that his home rate control is a beta-blockade, if his heart rate is less than 120 for about 30 minutes, please stop diltiazem gtt.  Hypertension Metoprolol tartrate 50 mg p.o. twice daily resumed  Hyperlipidemia Liver function tests ordered before atorvastatin 40 mg every evening can be resumed   Subjective: Patient was feeling more tired and lethargic today.  Appetite remained poor.  Physical Exam: Vitals:   05/20/22 0332 05/20/22 0752 05/20/22 1140 05/20/22 1642  BP: 130/82 126/86 137/81 131/84  Pulse: 89 93 89 91  Resp: 16 15 15 19   Temp: 97.9 F (36.6 C) 98 F (36.7 C) 98.4 F (36.9 C) 98.5 F (36.9 C)  TempSrc:      SpO2: 100% 99% 100% 100%  Weight:      Height:       General.  Ill-appearing gentleman, in no acute distress. Pulmonary.  Lungs clear bilaterally, normal respiratory effort. CV.  Regular rate and rhythm, no JVD, rub or murmur. Abdomen.  Soft, nontender, nondistended, BS positive. CNS.  Alert and oriented .  No focal neurologic deficit. Extremities.  No edema, no cyanosis, pulses intact and symmetrical. Psychiatry.  Judgment and insight appears normal. .   Data Reviewed: Prior data reviewed  Family Communication:   Disposition: Status is: Inpatient Remains inpatient appropriate because: Severity of illness  Planned Discharge Destination: Home  DVT prophylaxis.  Eliquis Time spent: 50 minutes  This record has been created using Systems analyst. Errors have been sought and corrected,but may not always be located. Such creation errors do not reflect on the standard of care.   Author: Lorella Nimrod, MD 05/20/2022 5:16  PM  For on call review [email or web address].

## 2022-05-20 NOTE — Consult Note (Signed)
Urology Consult  I have been asked to see the patient by Dr. Reesa Chew, for evaluation and management of gross hematuria.  Chief Complaint: Abdominal distention, decreased UOP  History of Present Illness: Mitchell Dixon is a 67 y.o. year old male with PMH paraplegia with neurogenic bladder managed by [facility] and paroxysmal A-fib on Eliquis admitted on 05/26/2022 with severe sepsis of likely urinary source, AKI, and metabolic derangements.  Admission urine culture grew multiple species, blood cultures grew Klebsiella aerogenes, susceptibilities pending.  He has been on antibiotics as below.  Repeat blood culture yesterday with no growth at <12 hours.  Urology has been consulted in the setting of worsening gross hematuria with passage of blood clots today.  He remains on Eliquis.  On arrival, there is a 26 [place] Foley catheter in place draining red urine with thready clots in the tubing.  Dr. Bernardo Heater previously consulted on him on 03/21/2022 during a prior admission for sepsis of likely pulmonary source.  CT chest abdomen and pelvis at that time was notable for an atrophic right kidney, nonobstructing left upper pole renal stone, and multiple bladder stones.  He was recommended for outpatient follow-up to discuss likely staged cystolitholapaxy, however he missed that appointment.  Anti-infectives (From admission, onward)    Start     Dose/Rate Route Frequency Ordered Stop   05/19/22 2200  ceFEPIme (MAXIPIME) 2 g in sodium chloride 0.9 % 100 mL IVPB        2 g 200 mL/hr over 30 Minutes Intravenous Every 24 hours 05/16/2022 2246 05/25/22 2159   05/19/22 0000  vancomycin (VANCOREADY) IVPB 1750 mg/350 mL        1,750 mg 175 mL/hr over 120 Minutes Intravenous  Once 05/26/2022 2242 05/19/22 0159   05/17/2022 2242  vancomycin variable dose per unstable renal function (pharmacist dosing)  Status:  Discontinued         Does not apply See admin instructions 05/30/2022 2242 05/19/22 0924   05/28/2022 2230   metroNIDAZOLE (FLAGYL) IVPB 500 mg  Status:  Discontinued        500 mg 100 mL/hr over 60 Minutes Intravenous Every 12 hours 05/19/2022 2227 05/19/22 0924   05/19/2022 2130  ceFEPIme (MAXIPIME) 2 g in sodium chloride 0.9 % 100 mL IVPB        2 g 200 mL/hr over 30 Minutes Intravenous  Once 05/25/2022 2118 05/10/2022 2310        Past Medical History:  Diagnosis Date   Ascending aortic dissection ([place])    a. 01/2017 s/p emergent repair @ [facility].   Cardiac pacemaker in situ    a. 03/2017 s/p MDT [facility] (ser# [id number]).   Chronic HFrEF (heart failure with reduced ejection fraction) ([place])    a. 06/2016 Echo: EF 45%, diff HK, GrI DD; b. 03/2017 Echo: EF 55-60%, apical wma, Gr2 DD; c. 06/2017 Echo: EF 55-60%, apical, apical inf, and mid antsept wma; d. 07/2018 Echo: EF 45%, apical thinning & AK. Dil Asc Ao.   Essential hypertension    LV (left ventricular) mural thrombus    a. 06/2016 Echo: EF 45%, diff HK w/ thrombus (0.9 x 0.9 x.0.7cm), GrI DD, mild AI/MR.   Mixed hyperlipidemia    PAD (peripheral artery disease) ([facility])    PAF (paroxysmal atrial fibrillation) ([facility])    a. CHA2DS2VASc = 5-->chronic eliquis.   Paraplegia ([place])    a.  Spinal cord ischemia and injury in the setting of aortic dissection in December 2018.  Stroke [facility]) 06/2016   Urinary incontinence    a. in setting of paraplegia - self catheterizes.    Past Surgical History:  Procedure Laterality Date   DIALYSIS/PERMA CATHETER INSERTION N/A 02/25/2017   Procedure: DIALYSIS/PERMA CATHETER INSERTION;  Surgeon: Algernon Huxley, MD;  Location: [facility];  Service: Cardiovascular;  Laterality: N/A;   EMBOLECTOMY Right 02/23/2017   Procedure: EMBOLECTOMY;  Surgeon: Katha Cabal, MD;  Location: [facility];  Service: Vascular;  Laterality: Right;   FASCIECTOMY Right 02/23/2017   Procedure: FASCIECTOMY;  Surgeon: Katha Cabal, MD;  Location: [facility];  Service: Vascular;  Laterality: Right;   HERNIA REPAIR       Home Medications:  Current Meds  Medication Sig   atorvastatin (LIPITOR) 40 MG tablet Take 40 mg by mouth every evening.   ELIQUIS 5 MG TABS tablet Take 5 mg by mouth 2 (two) times daily.   metoprolol tartrate (LOPRESSOR) 50 MG tablet Take 50 mg by mouth 2 (two) times daily.    Allergies: No Known Allergies  Family History  Problem Relation Age of Onset   Heart failure Mother    Dementia Mother    Hypertension Mother    Pneumonia Father    Heart failure Sister    Hypertension Sister     Social History:  reports that he has been smoking cigarettes. He has a 7.50 pack-year smoking history. He has never used smokeless tobacco. He reports that he does not drink alcohol and does not use drugs.  ROS: A complete review of systems was performed.  All systems are negative except for pertinent findings as noted.  Physical Exam:  Vital signs in last 24 hours: Temp:  [97.8 F (36.6 C)-98.4 F (36.9 C)] 98.4 F (36.9 C) (03/21 1140) Pulse Rate:  [84-95] 89 (03/21 1140) Resp:  [15-18] 15 (03/21 1140) BP: (124-137)/(76-92) 137/81 (03/21 1140) SpO2:  [81 %-100 %] 100 % (03/21 1140) Constitutional:  Alert and oriented, no acute distress HEENT: [place] AT, moist mucus membranes Cardiovascular: No clubbing, cyanosis, or edema Respiratory: Normal respiratory effort GU: Marked edema of the penis and scrotum extending to the bilateral thighs.  Skin is intact without purulence, erythema, crepitus, or malodor. Skin: No rashes, bruises or suspicious lesions Psychiatric: Normal mood and affect  Laboratory Data:  Recent Labs    05/10/2022 2009 05/19/22 0930 05/20/22 0912  WBC 25.7* 25.0* 19.0*  HGB 13.3 11.7* 10.8*  HCT 42.7 36.5* 33.1*   Recent Labs    05/26/2022 2009 05/19/22 0930 05/20/22 0912  NA 130* 129* 126*  K 3.0* 3.2* 3.0*  CL 92* 102 98  CO2 11* 13* 13*  GLUCOSE 129* 69* 50*  BUN 62* 63* 65*  CREATININE 5.11* 4.22* 4.55*  CALCIUM 8.8* 7.5* 7.6*   Recent Labs     05/19/22 0328  INR 2.7*   Urinalysis    Component Value Date/Time   COLORURINE YELLOW (A) 05/15/2022 2019   APPEARANCEUR TURBID (A) 05/17/2022 2019   LABSPEC 1.008 05/30/2022 2019   PHURINE 7.0 05/20/2022 2019   GLUCOSEU NEGATIVE 05/17/2022 2019   HGBUR LARGE (A) 05/13/2022 2019   [place] NEGATIVE 05/06/2022 2019   [place] NEGATIVE 05/29/2022 2019   PROTEINUR 100 (A) 05/10/2022 2019   NITRITE NEGATIVE 05/13/2022 2019   LEUKOCYTESUR LARGE (A) 05/14/2022 2019   Results for orders placed or performed during the hospital encounter of 05/07/2022  Urine Culture     Status: Abnormal   Collection Time: 05/29/2022  8:19 PM  Specimen: Urine, Catheterized  Result Value Ref Range Status   Specimen Description   Final    URINE, CATHETERIZED Performed at [facility], [address] [place], [place] [address]    Special Requests   Final    NONE Performed at [facility], [address] [place], [place] [address]    Culture MULTIPLE SPECIES PRESENT, SUGGEST RECOLLECTION (A)  Final   Report Status 05/19/2022 FINAL  Final  Culture, blood (Routine X 2) w Reflex to ID Panel     Status: Abnormal (Preliminary result)   Collection Time: 05/26/2022 10:41 PM   Specimen: BLOOD RIGHT FOREARM  Result Value Ref Range Status   Specimen Description   Final    BLOOD RIGHT FOREARM Performed at [facility], [place] [address], [place], [place] [address]    Special Requests   Final    BOTTLES DRAWN AEROBIC AND ANAEROBIC Blood Culture results may not be optimal due to an inadequate volume of blood received in culture bottles Performed at [facility], [address] [place], [place] [address]    Culture  Setup Time   Final    GRAM NEGATIVE RODS IN BOTH AEROBIC AND ANAEROBIC BOTTLES Organism ID to follow CRITICAL RESULT CALLED TO, READ BACK BY AND VERIFIED WITHAubery Lapping [id number] 05/19/22 HNM Performed at [facility], [address]  [place], [place] [address]    Culture (A)  Final    ENTEROBACTER AEROGENES CULTURE REINCUBATED FOR BETTER GROWTH Performed at [facility], [place] [address] [place], [place] [address]    Report Status PENDING  Incomplete  Blood Culture ID Panel (Reflexed)     Status: Abnormal   Collection Time: 05/14/2022 10:41 PM  Result Value Ref Range Status   Enterococcus faecalis NOT DETECTED NOT DETECTED Final   Enterococcus Faecium NOT DETECTED NOT DETECTED Final   Listeria monocytogenes NOT DETECTED NOT DETECTED Final   Staphylococcus species NOT DETECTED NOT DETECTED Final   Staphylococcus aureus (BCID) NOT DETECTED NOT DETECTED Final   Staphylococcus epidermidis NOT DETECTED NOT DETECTED Final   Staphylococcus lugdunensis NOT DETECTED NOT DETECTED Final   Streptococcus species NOT DETECTED NOT DETECTED Final   Streptococcus agalactiae NOT DETECTED NOT DETECTED Final   Streptococcus pneumoniae NOT DETECTED NOT DETECTED Final   Streptococcus pyogenes NOT DETECTED NOT DETECTED Final   A.calcoaceticus-baumannii NOT DETECTED NOT DETECTED Final   Bacteroides fragilis NOT DETECTED NOT DETECTED Final   Enterobacterales DETECTED (A) NOT DETECTED Final    Comment: Enterobacterales represent a large order of gram negative bacteria, not a single organism. CRITICAL RESULT CALLED TO, READ BACK BY AND VERIFIED WITH: JUSTIN MILLER [id number] 05/19/22 HNM    Enterobacter cloacae complex NOT DETECTED NOT DETECTED Final   Escherichia coli NOT DETECTED NOT DETECTED Final   Klebsiella aerogenes DETECTED (A) NOT DETECTED Final    Comment: CRITICAL RESULT CALLED TO, READ BACK BY AND VERIFIED WITH: JUSTIN MILLER [id number] 05/19/22 HNM    Klebsiella oxytoca NOT DETECTED NOT DETECTED Final   Klebsiella pneumoniae NOT DETECTED NOT DETECTED Final   Proteus species NOT DETECTED NOT DETECTED Final   Salmonella species NOT DETECTED NOT DETECTED Final   Serratia marcescens NOT DETECTED NOT DETECTED Final    Haemophilus influenzae NOT DETECTED NOT DETECTED Final   Neisseria meningitidis NOT DETECTED NOT DETECTED Final   Pseudomonas aeruginosa NOT DETECTED NOT DETECTED Final   Stenotrophomonas maltophilia NOT DETECTED NOT DETECTED Final   Candida albicans NOT  DETECTED NOT DETECTED Final   Candida auris NOT DETECTED NOT DETECTED Final   Candida glabrata NOT DETECTED NOT DETECTED Final   Candida krusei NOT DETECTED NOT DETECTED Final   Candida parapsilosis NOT DETECTED NOT DETECTED Final   Candida tropicalis NOT DETECTED NOT DETECTED Final   Cryptococcus neoformans/gattii NOT DETECTED NOT DETECTED Final   CTX-M ESBL NOT DETECTED NOT DETECTED Final   Carbapenem resistance IMP NOT DETECTED NOT DETECTED Final   Carbapenem resistance KPC NOT DETECTED NOT DETECTED Final   Carbapenem resistance NDM NOT DETECTED NOT DETECTED Final   Carbapenem resist OXA 48 LIKE NOT DETECTED NOT DETECTED Final   Carbapenem resistance VIM NOT DETECTED NOT DETECTED Final    Comment: Performed at [facility], [address] [place], [place] [address]  Culture, blood (Routine X 2) w Reflex to ID Panel     Status: None (Preliminary result)   Collection Time: 05/19/22  3:54 AM   Specimen: BLOOD  Result Value Ref Range Status   Specimen Description BLOOD LEFT HAND  Final   Special Requests   Final    BOTTLES DRAWN AEROBIC AND ANAEROBIC Blood Culture results may not be optimal due to an inadequate volume of blood received in culture bottles   Culture   Final    NO GROWTH < 12 HOURS Performed at [facility], [address], [place], [place] [address]    Report Status PENDING  Incomplete    Radiologic Imaging: US RENAL  Result Date: 05/19/2022 CLINICAL DATA:  Acute kidney insufficiency EXAM: RENAL / URINARY TRACT ULTRASOUND COMPLETE COMPARISON:  CT 03/20/2022 FINDINGS: Right Kidney: Renal measurements: 2.6 x 1.8 x 1.8 = volume: 4 mL. Extremely echogenic and atrophic. No collecting system dilatation.  This was seen on the prior CT. Left Kidney: Renal measurements: 12.2 x 7.0 x 5.8 = volume: 260 mL. No collecting system dilatation. No perinephric fluid. Echogenic shadowing area identified consistent with known stone in the upper pole measuring 11 mm by ultrasound. Larger by CT at 13 mm. Bladder: Underdistended. Other: None. IMPRESSION: Severely atrophic right kidney. Upper pole left-sided renal stone.  No collecting system dilatation. Electronically Signed   By: Jill Side M.D.   On: 05/19/2022 12:05   DG Chest 1 View  Result Date: 05/28/2022 CLINICAL DATA:  Atrial fibrillation. EXAM: CHEST  1 VIEW COMPARISON:  03/20/2022 FINDINGS: Prior median sternotomy. The heart is normal in size. 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 Lead less pacemaker in place. No pulmonary edema, pleural effusion, or pneumothorax. On limited assessment, no acute osseous finding. IMPRESSION: No acute findings. Electronically Signed   By: Keith Rake M.D.   On: 05/28/2022 20:55    Assessment & Plan:  67 year old male with PMH paraplegia and neurogenic bladder with bladder stones and atrophic right kidney admitted with sepsis of likely urinary source.  Urology consulted in the setting of worsening gross hematuria.  Gross hematuria likely multifactorial in the setting of urinary infection, bladder stones, and Eliquis.  Zara Council and I gently irrigated his Foley catheter at the bedside today using clean technique, irrigating with 120 cc of sterile water.  Catheter irrigated easily and we were able to evacuate approximately 5 cc of clot fragments with this.  Efflux cleared to light red with this.  Notably, he had edema of the penis, scrotum, and bilateral thighs on physical exam today, however low suspicion for cellulitis versus Fournier's gangrene at this time.  Suspect  secondary to volume overload, will defer to hospitalist for  management.  Recommendations: -Continue to trend CBC and manually irrigate Foley catheter gently as needed to keep it draining, consider holding Eliquis if hemoglobin drops or if requiring frequent manual irrigation -Outpatient follow-up in our clinic to discuss likely staged cystolitholapaxy -Continue antibiotics and follow cultures for total of 14 days of culture appropriate therapy  Thank you for involving me in this patient's care, please page with any further questions or concerns.  Debroah Loop, PA-C 05/20/2022 1:20 PM

## 2022-05-20 NOTE — Assessment & Plan Note (Signed)
Without septic shock at this time Patient had elevated respiration rate, heart rate, atrial fibrillation with RVR, organ involvement is renal and cardiac, suspected source is urine.  Blood cultures with Klebsiella erogenous, urine cultures with multiple species. Severe sepsis with AKI and significantly elevated lactic acidosis.  Received IV fluid and broad-spectrum antibiotics.  Lactic acidosis improved. ID was also consulted due to recurrent bacteremias -Continue with cefepime -Stop vancomycin -Follow-up final culture results

## 2022-05-20 NOTE — Progress Notes (Signed)
[facility]  ROUNDING NOTE   Subjective:   Patient seen resting in bed No family at bedside Denies pain or discomfort  Foley catheter continues to have dark drainage   Objective:  Vital signs in last 24 hours:  Temp:  [97.8 F (36.6 C)-98.4 F (36.9 C)] 98.4 F (36.9 C) (03/21 1140) Pulse Rate:  [84-95] 89 (03/21 1140) Resp:  [15-18] 15 (03/21 1140) BP: (124-137)/(76-92) 137/81 (03/21 1140) SpO2:  [81 %-100 %] 100 % (03/21 1140)  Weight change:  Filed Weights   05/26/2022 1943  Weight: 77.6 kg    Intake/Output: I/O last 3 completed shifts: In: 3487.4 [P.O.:480; I.V.:2557.4; Other:450] Out: 300 [Urine:300]   Intake/Output this shift:  Total I/O In: 101.1 [I.V.:1.1; IV Piggyback:100] Out: 225 [Urine:225]  Physical Exam: General: NAD  Head: Normocephalic, atraumatic. Moist oral mucosal membranes  Eyes: Anicteric  Lungs:  Clear to auscultation, normal effort  Heart: Regular rate and rhythm  Abdomen:  Soft, nontender  Extremities:  No peripheral edema.  Neurologic: Alert and oriented, moving all four extremities  Skin: No lesions  Access: None    Basic Metabolic Panel: Recent Labs  Lab 05/06/2022 2009 05/19/22 0052 05/19/22 0930 05/20/22 0912  NA 130*  --  129* 126*  K 3.0*  --  3.2* 3.0*  CL 92*  --  102 98  CO2 11*  --  13* 13*  GLUCOSE 129*  --  69* 50*  BUN 62*  --  63* 65*  CREATININE 5.11*  --  4.22* 4.55*  CALCIUM 8.8*  --  7.5* 7.6*  MG  --  1.6* 1.8  --     Liver Function Tests: Recent Labs  Lab 05/24/2022 1945  AST 64*  ALT 27  ALKPHOS 145*  BILITOT 1.8*  PROT 7.5  ALBUMIN 2.6*   No results for input(s): "LIPASE", "AMYLASE" in the last 168 hours. No results for input(s): "AMMONIA" in the last 168 hours.  CBC: Recent Labs  Lab 05/14/2022 2009 05/19/22 0930 05/20/22 0912  WBC 25.7* 25.0* 19.0*  HGB 13.3 11.7* 10.8*  HCT 42.7 36.5* 33.1*  MCV 95.5 93.4 90.9  PLT 273 195 191    Cardiac Enzymes: No results for  input(s): "CKTOTAL", "CKMB", "CKMBINDEX", "TROPONINI" in the last 168 hours.  BNP: Invalid input(s): "POCBNP"  CBG: Recent Labs  Lab 05/20/22 1017 05/20/22 1140  GLUCAP 77 104*    Microbiology: Results for orders placed or performed during the hospital encounter of 05/04/2022  Urine Culture     Status: Abnormal   Collection Time: 05/06/2022  8:19 PM   Specimen: Urine, Catheterized  Result Value Ref Range Status   Specimen Description   Final    URINE, CATHETERIZED Performed at [facility], [address], [place], [place] [address]    Special Requests   Final    NONE Performed at [facility], [address] [place], [place] [address]    Culture MULTIPLE SPECIES PRESENT, SUGGEST RECOLLECTION (A)  Final   Report Status 05/19/2022 FINAL  Final  Culture, blood (Routine X 2) w Reflex to ID Panel     Status: Abnormal (Preliminary result)   Collection Time: 05/15/2022 10:41 PM   Specimen: BLOOD RIGHT FOREARM  Result Value Ref Range Status   Specimen Description   Final    BLOOD RIGHT FOREARM Performed at [facility], [place] [address], [place], [place] [address]    Special Requests   Final    BOTTLES DRAWN AEROBIC AND  ANAEROBIC Blood Culture results may not be optimal due to an inadequate volume of blood received in culture bottles Performed at [facility], [address] [place], [place] [address]    Culture  Setup Time   Final    GRAM NEGATIVE RODS IN BOTH AEROBIC AND ANAEROBIC BOTTLES Organism ID to follow CRITICAL RESULT CALLED TO, READ BACK BY AND VERIFIED WITHAubery Lapping PHARMD 1152 05/19/22 HNM Performed at [facility], [address], [place], [place] [address]    Culture (A)  Final    ENTEROBACTER AEROGENES CULTURE REINCUBATED FOR BETTER GROWTH Performed at [facility], [place] [address] [place], [place] [address]    Report Status PENDING  Incomplete  Blood Culture ID Panel (Reflexed)     Status: Abnormal    Collection Time: 05/12/2022 10:41 PM  Result Value Ref Range Status   Enterococcus faecalis NOT DETECTED NOT DETECTED Final   Enterococcus Faecium NOT DETECTED NOT DETECTED Final   Listeria monocytogenes NOT DETECTED NOT DETECTED Final   Staphylococcus species NOT DETECTED NOT DETECTED Final   Staphylococcus aureus (BCID) NOT DETECTED NOT DETECTED Final   Staphylococcus epidermidis NOT DETECTED NOT DETECTED Final   Staphylococcus lugdunensis NOT DETECTED NOT DETECTED Final   Streptococcus species NOT DETECTED NOT DETECTED Final   Streptococcus agalactiae NOT DETECTED NOT DETECTED Final   Streptococcus pneumoniae NOT DETECTED NOT DETECTED Final   Streptococcus pyogenes NOT DETECTED NOT DETECTED Final   A.calcoaceticus-baumannii NOT DETECTED NOT DETECTED Final   Bacteroides fragilis NOT DETECTED NOT DETECTED Final   Enterobacterales DETECTED (A) NOT DETECTED Final    Comment: Enterobacterales represent a large order of gram negative bacteria, not a single organism. CRITICAL RESULT CALLED TO, READ BACK BY AND VERIFIED WITH: JUSTIN MILLER [id number] 05/19/22 HNM    Enterobacter cloacae complex NOT DETECTED NOT DETECTED Final   Escherichia coli NOT DETECTED NOT DETECTED Final   Klebsiella aerogenes DETECTED (A) NOT DETECTED Final    Comment: CRITICAL RESULT CALLED TO, READ BACK BY AND VERIFIED WITH: JUSTIN MILLER [id number] 05/19/22 HNM    Klebsiella oxytoca NOT DETECTED NOT DETECTED Final   Klebsiella pneumoniae NOT DETECTED NOT DETECTED Final   Proteus species NOT DETECTED NOT DETECTED Final   Salmonella species NOT DETECTED NOT DETECTED Final   Serratia marcescens NOT DETECTED NOT DETECTED Final   Haemophilus influenzae NOT DETECTED NOT DETECTED Final   Neisseria meningitidis NOT DETECTED NOT DETECTED Final   Pseudomonas aeruginosa NOT DETECTED NOT DETECTED Final   Stenotrophomonas maltophilia NOT DETECTED NOT DETECTED Final   Candida albicans NOT DETECTED NOT DETECTED Final   Candida  auris NOT DETECTED NOT DETECTED Final   Candida glabrata NOT DETECTED NOT DETECTED Final   Candida krusei NOT DETECTED NOT DETECTED Final   Candida parapsilosis NOT DETECTED NOT DETECTED Final   Candida tropicalis NOT DETECTED NOT DETECTED Final   Cryptococcus neoformans/gattii NOT DETECTED NOT DETECTED Final   CTX-M ESBL NOT DETECTED NOT DETECTED Final   Carbapenem resistance IMP NOT DETECTED NOT DETECTED Final   Carbapenem resistance KPC NOT DETECTED NOT DETECTED Final   Carbapenem resistance NDM NOT DETECTED NOT DETECTED Final   Carbapenem resist OXA 48 LIKE NOT DETECTED NOT DETECTED Final   Carbapenem resistance VIM NOT DETECTED NOT DETECTED Final    Comment: Performed at [facility], [address] [place], [place] [address]  Culture, blood (Routine X 2) w Reflex to ID Panel     Status: None (Preliminary result)  Collection Time: 05/19/22  3:54 AM   Specimen: BLOOD  Result Value Ref Range Status   Specimen Description BLOOD LEFT HAND  Final   Special Requests   Final    BOTTLES DRAWN AEROBIC AND ANAEROBIC Blood Culture results may not be optimal due to an inadequate volume of blood received in culture bottles   Culture   Final    NO GROWTH < 12 HOURS Performed at [facility], [address] [place], [place] [address]    Report Status PENDING  Incomplete    Coagulation Studies: Recent Labs    05/19/22 0328  LABPROT 28.4*  INR 2.7*    Urinalysis: Recent Labs    May 28, 2022 2019  COLORURINE YELLOW*  LABSPEC 1.008  PHURINE 7.0  GLUCOSEU NEGATIVE  HGBUR LARGE*  BILIRUBINUR NEGATIVE  KETONESUR NEGATIVE  PROTEINUR 100*  NITRITE NEGATIVE  LEUKOCYTESUR LARGE*      Imaging: US RENAL  Result Date: 05/19/2022 CLINICAL DATA:  Acute kidney insufficiency EXAM: RENAL / URINARY TRACT ULTRASOUND COMPLETE COMPARISON:  CT 03/20/2022 FINDINGS: Right Kidney: Renal measurements: 2.6 x 1.8 x 1.8 = volume: 4 mL. Extremely echogenic and atrophic. No collecting  system dilatation. This was seen on the prior CT. Left Kidney: Renal measurements: 12.2 x 7.0 x 5.8 = volume: 260 mL. No collecting system dilatation. No perinephric fluid. Echogenic shadowing area identified consistent with known stone in the upper pole measuring 11 mm by ultrasound. Larger by CT at 13 mm. Bladder: Underdistended. Other: None. IMPRESSION: Severely atrophic right kidney. Upper pole left-sided renal stone.  No collecting system dilatation. Electronically Signed   By: Jill Side M.D.   On: 05/19/2022 12:05   DG Chest 1 View  Result Date: 2022/05/28 CLINICAL DATA:  Atrial fibrillation. EXAM: CHEST  1 VIEW COMPARISON:  03/20/2022 FINDINGS: Prior median sternotomy. The heart is normal in size. 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 Lead less pacemaker in place. No pulmonary edema, pleural effusion, or pneumothorax. On limited assessment, no acute osseous finding. IMPRESSION: No acute findings. Electronically Signed   By: Keith Rake M.D.   On: 05/28/2022 20:55     Medications:    ceFEPime (MAXIPIME) IV Stopped (05/19/22 2218)   dextrose 5 % and 0.9 % NaCl with KCl 20 mEq/L 100 mL/hr at 05/20/22 1130    apixaban  5 mg Oral BID   Chlorhexidine Gluconate Cloth  6 each Topical Daily   metoprolol tartrate  50 mg Oral BID   sodium bicarbonate  650 mg Oral BID   acetaminophen **OR** acetaminophen, ipratropium-albuterol, levalbuterol, ondansetron **OR** ondansetron (ZOFRAN) IV, senna-docusate  Assessment/ Plan:  [name] is a 67 y.o.  male  past medical conditions including atrial fibrillation on Eliquis, pacemaker, spinal cord ischemia, PAD, and hypertension, who was admitted to [facility] on 2022/05/28 for Urinary retention [R33.9] Atrial fibrillation with RVR ([place]) [I48.91] Severe sepsis ([place]) [A41.9, R65.20] Urinary tract infection with hematuria, site unspecified [N39.0, R31.9] Sepsis, due to  unspecified organism, unspecified whether acute organ dysfunction present ([place]) [A41.9]   Acute kidney injury likely secondary to bladder outlet obstruction with infection. Baseline creatinine 1.22 on  04/27/22. Foley catheter placed by ED staff.  UA positive for UTI.  No IV contrast exposure.  Suspect renal function to continue to improve with treatment of underlying cause.   Renal function continues to decline. Urology consulted to evaluate foley catheter and drainage concerns.  No acute need for dialysis at this  time.  Continue current treatments  Lab Results  Component Value Date   CREATININE 4.55 (H) 05/20/2022   CREATININE 4.22 (H) 05/19/2022   CREATININE 5.11 (H) 05/26/2022    Intake/Output Summary (Last 24 hours) at 05/20/2022 1323 Last data filed at 05/20/2022 1202 Gross per 24 hour  Intake 3588.52 ml  Output 525 ml  Net 3063.52 ml   2.  Hyponatremia/hypokalemia.  Likely secondary to kidney injury. Sodium 126 with potassium 3.0. IVF changed to D5 in NS with 20KCl.    3.  Acute metabolic acidosis.  Serum bicarb 11 on admission. Now 13, continue ordered sodium bicarbonate tablets 650 mg twice daily.   4.  Hypertension, essential.  Home regimen includes metoprolol.  Currently receiving this medication.    LOS: 2 Jasiya Markie 3/21/20241:23 PM

## 2022-05-20 NOTE — Assessment & Plan Note (Signed)
Patient with history of nephrolithiasis and bladder stone during prior hospitalization and was referred for outpatient urology for definitive treatment.  Apparently he missed his appointment. Persistent hematuria with some clots so urology was consulted. -They are recommending gentle bladder irrigation. -Continue to monitor

## 2022-05-20 NOTE — TOC Initial Note (Signed)
Transition of Care [facility]) - Initial/Assessment Note    Patient Details  Name: Mitchell Dixon MRN: [id number] Date of Birth: 23-Aug-1955  Transition of Care [facility]) CM/SW Contact:    Laurena Slimmer, RN Phone Number: 05/20/2022, 3:08 PM  Clinical Narrative:                  Transition of Care [facility]) Screening Note   Patient Details  Name: Mitchell Dixon Date of Birth: 06/27/1955   Transition of Care [facility]) CM/SW Contact:    Laurena Slimmer, RN Phone Number: 05/20/2022, 3:08 PM    Transition of Care [facility]) has reviewed patient and no TOC needs have been identified at this time. We will continue to monitor patient advancement through interdisciplinary progression rounds. If new patient transition needs arise, please place a TOC consult.          Patient Goals and CMS Choice            Expected Discharge Plan and Services                                              Prior Living Arrangements/Services                       Activities of Daily Living Home Assistive Devices/Equipment: Wheelchair ADL Screening (condition at time of admission) Patient's cognitive ability adequate to safely complete daily activities?: No Is the patient deaf or have difficulty hearing?: No Does the patient have difficulty seeing, even when wearing glasses/contacts?: No Does the patient have difficulty concentrating, remembering, or making decisions?: No Patient able to express need for assistance with ADLs?: Yes Does the patient have difficulty dressing or bathing?: Yes Independently performs ADLs?: No Communication: Independent Dressing (OT): Needs assistance Is this a change from baseline?: Pre-admission baseline Grooming: Needs assistance Is this a change from baseline?: Pre-admission baseline Feeding: Independent Bathing: Needs assistance Is this a change from baseline?: Pre-admission baseline Toileting: Needs assistance Is this a  change from baseline?: Pre-admission baseline In/Out Bed: Needs assistance Is this a change from baseline?: Pre-admission baseline Walks in Home: Needs assistance Is this a change from baseline?: Pre-admission baseline Does the patient have difficulty walking or climbing stairs?: Yes Weakness of Legs: Both Weakness of Arms/Hands: None  Permission Sought/Granted                  Emotional Assessment              Admission diagnosis:  Urinary retention [R33.9] Atrial fibrillation with RVR (HCC) [I48.91] Severe sepsis ([place]) [A41.9, R65.20] Urinary tract infection with hematuria, site unspecified [N39.0, R31.9] Sepsis, due to unspecified organism, unspecified whether acute organ dysfunction present [facility]) [A41.9] Patient Active Problem List   Diagnosis Date Noted   Severe sepsis ([place]) 05/20/2022   Hyponatremia 05/25/2022   Elevated troponin 99991111   Metabolic acidosis 99991111   Atrial fibrillation with rapid ventricular response ([place]) 03/24/2022   Pacemaker 03/24/2022   Paroxysmal atrial fibrillation ([place]) 03/23/2022   Neurogenic bladder 03/21/2022   Atrophic kidney 03/21/2022   Nephrolithiasis 03/21/2022   Sepsis ([place]) 03/20/2022   Bladder calculi 03/20/2022   CAP (community acquired pneumonia) 03/20/2022   Chronic complete paraplegia ([place]) 03/20/2022   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 01/2017 03/20/2022   Urinary tract infection with hematuria 03/20/2022  History of cardiac pacemaker 03/20/2022   Hypokalemia 03/20/2022   PAD (peripheral artery disease) ([place]) 03/20/2022   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/07/2017   Critical lower limb ischemia ([place]) 04/07/2017   AKI (acute kidney injury) ([place]) 04/07/2017   Patent foramen ovale 02/28/2017   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 ([place]) 02/23/2017   Hypertension 07/20/2016   Hyperlipidemia 07/20/2016   CVA (cerebral vascular accident) ([place])  07/03/2016   PCP:  Donnie Coffin, MD Pharmacy:   [facility] [place] [address] Phone: [phone] Fax: [place], [place] [address] Phone: [phone] Fax: [phone]  [place] [address]), [place]) [place] [address] Phone: [phone] Fax: [phone]     Social Determinants of Health (SDOH) Social History: [place]: No Food Insecurity (05/19/2022)  Housing: Low Risk  (05/19/2022)  Transportation Needs: No Transportation Needs (05/19/2022)  Utilities: Not At Risk (05/19/2022)  Tobacco Use: High Risk (05/08/2022)   SDOH Interventions:     Readmission Risk Interventions     No data to display

## 2022-05-21 ENCOUNTER — Inpatient Hospital Stay: Payer: Medicaid Other

## 2022-05-21 ENCOUNTER — Ambulatory Visit: Payer: Medicaid Other

## 2022-05-21 DIAGNOSIS — R339 Retention of urine, unspecified: Secondary | ICD-10-CM

## 2022-05-21 DIAGNOSIS — N390 Urinary tract infection, site not specified: Secondary | ICD-10-CM | POA: Diagnosis not present

## 2022-05-21 DIAGNOSIS — B9689 Other specified bacterial agents as the cause of diseases classified elsewhere: Secondary | ICD-10-CM

## 2022-05-21 DIAGNOSIS — R7881 Bacteremia: Secondary | ICD-10-CM

## 2022-05-21 DIAGNOSIS — J9601 Acute respiratory failure with hypoxia: Secondary | ICD-10-CM

## 2022-05-21 DIAGNOSIS — R4182 Altered mental status, unspecified: Secondary | ICD-10-CM | POA: Diagnosis not present

## 2022-05-21 DIAGNOSIS — G928 Other toxic encephalopathy: Secondary | ICD-10-CM

## 2022-05-21 DIAGNOSIS — I4891 Unspecified atrial fibrillation: Secondary | ICD-10-CM | POA: Diagnosis not present

## 2022-05-21 DIAGNOSIS — A419 Sepsis, unspecified organism: Secondary | ICD-10-CM | POA: Diagnosis not present

## 2022-05-21 DIAGNOSIS — N179 Acute kidney failure, unspecified: Secondary | ICD-10-CM | POA: Diagnosis not present

## 2022-05-21 DIAGNOSIS — R310 Gross hematuria: Secondary | ICD-10-CM

## 2022-05-21 DIAGNOSIS — G8221 Paraplegia, complete: Secondary | ICD-10-CM | POA: Diagnosis not present

## 2022-05-21 LAB — BLOOD GAS, ARTERIAL
Acid-base deficit: 11.2 mmol/L — ABNORMAL HIGH (ref 0.0–2.0)
Acid-base deficit: 9.6 mmol/L — ABNORMAL HIGH (ref 0.0–2.0)
Bicarbonate: 13.9 mmol/L — ABNORMAL LOW (ref 20.0–28.0)
Bicarbonate: 14.8 mmol/L — ABNORMAL LOW (ref 20.0–28.0)
FIO2: 100 %
FIO2: 35 %
MECHVT: 500 mL
MECHVT: 500 mL
Mechanical Rate: 20
O2 Saturation: 100 %
O2 Saturation: 99.8 %
PEEP: 5 cmH2O
PEEP: 5 cmH2O
Patient temperature: 37
Patient temperature: 37
RATE: 20 resp/min
pCO2 arterial: 29 mmHg — ABNORMAL LOW (ref 32–48)
pCO2 arterial: 28 mmHg — ABNORMAL LOW (ref 32–48)
pH, Arterial: 7.29 — ABNORMAL LOW (ref 7.35–7.45)
pH, Arterial: 7.33 — ABNORMAL LOW (ref 7.35–7.45)
pO2, Arterial: 406 mmHg — ABNORMAL HIGH (ref 83–108)
pO2, Arterial: 153 mmHg — ABNORMAL HIGH (ref 83–108)

## 2022-05-21 LAB — LACTIC ACID, PLASMA
Lactic Acid, Venous: 2.9 mmol/L (ref 0.5–1.9)
Lactic Acid, Venous: 1.4 mmol/L (ref 0.5–1.9)

## 2022-05-21 LAB — CBC WITH DIFFERENTIAL/PLATELET
Abs Immature Granulocytes: 2.24 10*3/uL — ABNORMAL HIGH (ref 0.00–0.07)
Basophils Absolute: 0.1 10*3/uL (ref 0.0–0.1)
Basophils Relative: 1 %
Eosinophils Absolute: 0.2 10*3/uL (ref 0.0–0.5)
Eosinophils Relative: 1 %
HCT: 31.8 % — ABNORMAL LOW (ref 39.0–52.0)
Hemoglobin: 10.3 g/dL — ABNORMAL LOW (ref 13.0–17.0)
Immature Granulocytes: 14 %
Lymphocytes Relative: 16 %
Lymphs Abs: 2.6 10*3/uL (ref 0.7–4.0)
MCH: 29.9 pg (ref 26.0–34.0)
MCHC: 32.4 g/dL (ref 30.0–36.0)
MCV: 92.2 fL (ref 80.0–100.0)
Monocytes Absolute: 1.1 10*3/uL — ABNORMAL HIGH (ref 0.1–1.0)
Monocytes Relative: 7 %
Neutro Abs: 9.5 10*3/uL — ABNORMAL HIGH (ref 1.7–7.7)
Neutrophils Relative %: 61 %
Platelets: 217 10*3/uL (ref 150–400)
RBC: 3.45 MIL/uL — ABNORMAL LOW (ref 4.22–5.81)
RDW: 15.6 % — ABNORMAL HIGH (ref 11.5–15.5)
Smear Review: NORMAL
WBC: 15.7 10*3/uL — ABNORMAL HIGH (ref 4.0–10.5)
nRBC: 0 % (ref 0.0–0.2)

## 2022-05-21 LAB — COMPREHENSIVE METABOLIC PANEL
ALT: 16 U/L (ref 0–44)
AST: 35 U/L (ref 15–41)
Albumin: 1.8 g/dL — ABNORMAL LOW (ref 3.5–5.0)
Alkaline Phosphatase: 98 U/L (ref 38–126)
Anion gap: 11 (ref 5–15)
BUN: 64 mg/dL — ABNORMAL HIGH (ref 8–23)
CO2: 15 mmol/L — ABNORMAL LOW (ref 22–32)
Calcium: 6.9 mg/dL — ABNORMAL LOW (ref 8.9–10.3)
Chloride: 101 mmol/L (ref 98–111)
Creatinine, Ser: 4.49 mg/dL — ABNORMAL HIGH (ref 0.61–1.24)
GFR, Estimated: 14 mL/min — ABNORMAL LOW (ref >60)
Glucose, Bld: 124 mg/dL — ABNORMAL HIGH (ref 70–99)
Potassium: 2.9 mmol/L — ABNORMAL LOW (ref 3.5–5.1)
Sodium: 127 mmol/L — ABNORMAL LOW (ref 135–145)
Total Bilirubin: 1 mg/dL (ref 0.3–1.2)
Total Protein: 5.5 g/dL — ABNORMAL LOW (ref 6.5–8.1)

## 2022-05-21 LAB — TROPONIN I (HIGH SENSITIVITY)
Troponin I (High Sensitivity): 44 ng/L — ABNORMAL HIGH (ref <18)
Troponin I (High Sensitivity): 49 ng/L — ABNORMAL HIGH (ref <18)

## 2022-05-21 LAB — BASIC METABOLIC PANEL
Anion gap: 10 (ref 5–15)
BUN: 64 mg/dL — ABNORMAL HIGH (ref 8–23)
CO2: 17 mmol/L — ABNORMAL LOW (ref 22–32)
Calcium: 7.2 mg/dL — ABNORMAL LOW (ref 8.9–10.3)
Chloride: 98 mmol/L (ref 98–111)
Creatinine, Ser: 4.56 mg/dL — ABNORMAL HIGH (ref 0.61–1.24)
GFR, Estimated: 14 mL/min — ABNORMAL LOW (ref >60)
Glucose, Bld: 116 mg/dL — ABNORMAL HIGH (ref 70–99)
Potassium: 3.5 mmol/L (ref 3.5–5.1)
Sodium: 125 mmol/L — ABNORMAL LOW (ref 135–145)

## 2022-05-21 LAB — BETA-HYDROXYBUTYRIC ACID: Beta-Hydroxybutyric Acid: 0.11 mmol/L (ref 0.05–0.27)

## 2022-05-21 LAB — HIV ANTIBODY (ROUTINE TESTING W REFLEX): HIV Screen 4th Generation wRfx: NONREACTIVE

## 2022-05-21 LAB — RENAL FUNCTION PANEL
Albumin: 1.9 g/dL — ABNORMAL LOW (ref 3.5–5.0)
Anion gap: 15 (ref 5–15)
BUN: 66 mg/dL — ABNORMAL HIGH (ref 8–23)
CO2: 16 mmol/L — ABNORMAL LOW (ref 22–32)
Calcium: 7.3 mg/dL — ABNORMAL LOW (ref 8.9–10.3)
Chloride: 98 mmol/L (ref 98–111)
Creatinine, Ser: 4.37 mg/dL — ABNORMAL HIGH (ref 0.61–1.24)
GFR, Estimated: 14 mL/min — ABNORMAL LOW (ref >60)
Glucose, Bld: 169 mg/dL — ABNORMAL HIGH (ref 70–99)
Phosphorus: 2.9 mg/dL (ref 2.5–4.6)
Potassium: 3 mmol/L — ABNORMAL LOW (ref 3.5–5.1)
Sodium: 129 mmol/L — ABNORMAL LOW (ref 135–145)

## 2022-05-21 LAB — GLUCOSE, CAPILLARY
Glucose-Capillary: 107 mg/dL — ABNORMAL HIGH (ref 70–99)
Glucose-Capillary: 138 mg/dL — ABNORMAL HIGH (ref 70–99)
Glucose-Capillary: 137 mg/dL — ABNORMAL HIGH (ref 70–99)
Glucose-Capillary: 119 mg/dL — ABNORMAL HIGH (ref 70–99)
Glucose-Capillary: 92 mg/dL (ref 70–99)
Glucose-Capillary: 72 mg/dL (ref 70–99)
Glucose-Capillary: 82 mg/dL (ref 70–99)

## 2022-05-21 LAB — D-DIMER, QUANTITATIVE: D-Dimer, Quant: 10.48 ug/mL-FEU — ABNORMAL HIGH (ref 0.00–0.50)

## 2022-05-21 LAB — APTT
aPTT: 44 seconds — ABNORMAL HIGH (ref 24–36)
aPTT: >200 seconds (ref 24–36)

## 2022-05-21 LAB — TYPE AND SCREEN

## 2022-05-21 LAB — PROTIME-INR
INR: 2.1 — ABNORMAL HIGH (ref 0.8–1.2)
Prothrombin Time: 23.2 seconds — ABNORMAL HIGH (ref 11.4–15.2)

## 2022-05-21 LAB — MAGNESIUM: Magnesium: 2.5 mg/dL — ABNORMAL HIGH (ref 1.7–2.4)

## 2022-05-21 LAB — MRSA NEXT GEN BY PCR, NASAL: MRSA by PCR Next Gen: NOT DETECTED

## 2022-05-21 LAB — HEPARIN LEVEL (UNFRACTIONATED): Heparin Unfractionated: >1.1 IU/mL — ABNORMAL HIGH (ref 0.30–0.70)

## 2022-05-21 MED ORDER — SODIUM CHLORIDE 0.9% IV SOLUTION: Freq: Once | INTRAVENOUS | Status: DC

## 2022-05-21 MED ORDER — ORAL CARE MOUTH RINSE: 15.0000 mL | OROMUCOSAL | Status: DC | PRN

## 2022-05-21 MED ORDER — HEPARIN (PORCINE) 25000 UT/250ML-% IV SOLN
1200.0000 [IU]/h | INTRAVENOUS | Status: DC
  Administered 2022-05-21: 1200 [IU]/h via INTRAVENOUS
  Filled 2022-05-21: qty 250

## 2022-05-21 MED ORDER — ROCURONIUM BROMIDE 10 MG/ML (PF) SYRINGE
PREFILLED_SYRINGE | INTRAVENOUS | Status: AC
  Administered 2022-05-21: 100 mg via INTRAVENOUS
  Filled 2022-05-21: qty 10

## 2022-05-21 MED ORDER — FREE WATER
30.0000 mL | Status: DC
  Administered 2022-05-21 – 2022-05-24 (×19): 30 mL

## 2022-05-21 MED ORDER — POTASSIUM CHLORIDE 20 MEQ PO PACK
40.0000 meq | PACK | Freq: Once | ORAL | Status: AC
  Administered 2022-05-21: 40 meq
  Filled 2022-05-21: qty 2

## 2022-05-21 MED ORDER — ONDANSETRON HCL 4 MG PO TABS: 4.0000 mg | ORAL_TABLET | Freq: Four times a day (QID) | ORAL | Status: AC | PRN

## 2022-05-21 MED ORDER — FENTANYL CITRATE (PF) 100 MCG/2ML IJ SOLN
INTRAMUSCULAR | Status: AC
  Administered 2022-05-21: 100 ug via INTRAVENOUS
  Filled 2022-05-21: qty 2

## 2022-05-21 MED ORDER — STERILE WATER FOR INJECTION IV SOLN
INTRAVENOUS | Status: DC
  Filled 2022-05-21 (×4): qty 1000

## 2022-05-21 MED ORDER — FENTANYL CITRATE (PF) 100 MCG/2ML IJ SOLN: 100.0000 ug | Freq: Once | INTRAMUSCULAR | Status: AC

## 2022-05-21 MED ORDER — VANCOMYCIN VARIABLE DOSE PER UNSTABLE RENAL FUNCTION (PHARMACIST DOSING): Status: DC

## 2022-05-21 MED ORDER — JUVEN PO PACK
1.0000 | PACK | Freq: Two times a day (BID) | ORAL | Status: DC
  Administered 2022-05-22 – 2022-05-24 (×6): 1

## 2022-05-21 MED ORDER — ACETAMINOPHEN 650 MG RE SUPP: 650.0000 mg | Freq: Four times a day (QID) | RECTAL | Status: AC | PRN

## 2022-05-21 MED ORDER — PHENYLEPHRINE HCL-NACL 20-0.9 MG/250ML-% IV SOLN
0.0000 ug/min | INTRAVENOUS | Status: DC
  Filled 2022-05-21: qty 250

## 2022-05-21 MED ORDER — NOREPINEPHRINE 16 MG/250ML-% IV SOLN
0.0000 ug/min | INTRAVENOUS | Status: DC
  Filled 2022-05-21 (×2): qty 250

## 2022-05-21 MED ORDER — ETOMIDATE 2 MG/ML IV SOLN
INTRAVENOUS | Status: AC
  Administered 2022-05-21: 10 mg
  Filled 2022-05-21: qty 10

## 2022-05-21 MED ORDER — NOREPINEPHRINE 4 MG/250ML-% IV SOLN
INTRAVENOUS | Status: AC
  Filled 2022-05-21: qty 250

## 2022-05-21 MED ORDER — SODIUM BICARBONATE 650 MG PO TABS: 650.0000 mg | ORAL_TABLET | Freq: Two times a day (BID) | ORAL | Status: DC

## 2022-05-21 MED ORDER — MAGNESIUM SULFATE 2 GM/50ML IV SOLN
2.0000 g | Freq: Once | INTRAVENOUS | Status: DC
  Filled 2022-05-21: qty 50

## 2022-05-21 MED ORDER — ROCURONIUM BROMIDE 10 MG/ML (PF) SYRINGE: 100.0000 mg | PREFILLED_SYRINGE | Freq: Once | INTRAVENOUS | Status: AC

## 2022-05-21 MED ORDER — ORAL CARE MOUTH RINSE
15.0000 mL | OROMUCOSAL | Status: DC
  Administered 2022-05-21 – 2022-05-25 (×48): 15 mL via OROMUCOSAL

## 2022-05-21 MED ORDER — ADULT MULTIVITAMIN LIQUID CH
15.0000 mL | Freq: Every day | ORAL | Status: DC
  Administered 2022-05-22 – 2022-05-25 (×4): 15 mL
  Filled 2022-05-21 (×5): qty 15

## 2022-05-21 MED ORDER — POTASSIUM CHLORIDE 10 MEQ/50ML IV SOLN
10.0000 meq | INTRAVENOUS | Status: AC
  Administered 2022-05-21 (×6): 10 meq via INTRAVENOUS
  Filled 2022-05-21 (×6): qty 50

## 2022-05-21 MED ORDER — ETOMIDATE 2 MG/ML IV SOLN: 10.0000 mg | Freq: Once | INTRAVENOUS | Status: AC

## 2022-05-21 MED ORDER — PROSOURCE TF20 ENFIT COMPATIBL EN LIQD
60.0000 mL | Freq: Two times a day (BID) | ENTERAL | Status: DC
  Administered 2022-05-22 – 2022-05-24 (×5): 60 mL
  Filled 2022-05-21: qty 60

## 2022-05-21 MED ORDER — METOPROLOL TARTRATE 50 MG PO TABS: 50.0000 mg | ORAL_TABLET | Freq: Two times a day (BID) | ORAL | Status: DC

## 2022-05-21 MED ORDER — VANCOMYCIN HCL IN DEXTROSE 1-5 GM/200ML-% IV SOLN
1000.0000 mg | Freq: Once | INTRAVENOUS | Status: AC
  Administered 2022-05-21: 1000 mg via INTRAVENOUS
  Filled 2022-05-21: qty 200

## 2022-05-21 MED ORDER — ACETAMINOPHEN 325 MG PO TABS: 650.0000 mg | ORAL_TABLET | Freq: Four times a day (QID) | ORAL | Status: AC | PRN

## 2022-05-21 MED ORDER — HEPARIN BOLUS VIA INFUSION
4000.0000 [IU] | Freq: Once | INTRAVENOUS | Status: AC
  Administered 2022-05-21: 4000 [IU] via INTRAVENOUS
  Filled 2022-05-21: qty 4000

## 2022-05-21 MED ORDER — NOREPINEPHRINE 4 MG/250ML-% IV SOLN
0.0000 ug/min | INTRAVENOUS | Status: DC
  Administered 2022-05-21: 6 ug/min via INTRAVENOUS
  Administered 2022-05-21: 8 ug/min via INTRAVENOUS
  Administered 2022-05-22: 6 ug/min via INTRAVENOUS
  Administered 2022-05-23: 3 ug/min via INTRAVENOUS
  Administered 2022-05-24: 2 ug/min via INTRAVENOUS
  Filled 2022-05-21 (×4): qty 250

## 2022-05-21 MED ORDER — PROPOFOL 1000 MG/100ML IV EMUL
5.0000 ug/kg/min | INTRAVENOUS | Status: DC
  Administered 2022-05-21: 45 ug/kg/min via INTRAVENOUS
  Administered 2022-05-21: 5 ug/kg/min via INTRAVENOUS
  Administered 2022-05-21: 35 ug/kg/min via INTRAVENOUS
  Administered 2022-05-22: 50 ug/kg/min via INTRAVENOUS
  Administered 2022-05-22: 40 ug/kg/min via INTRAVENOUS
  Administered 2022-05-22: 25 ug/kg/min via INTRAVENOUS
  Administered 2022-05-22: 40 ug/kg/min via INTRAVENOUS
  Administered 2022-05-22: 50 ug/kg/min via INTRAVENOUS
  Administered 2022-05-23: 35 ug/kg/min via INTRAVENOUS
  Administered 2022-05-23 (×3): 40 ug/kg/min via INTRAVENOUS
  Administered 2022-05-23: 50 ug/kg/min via INTRAVENOUS
  Administered 2022-05-23: 30 ug/kg/min via INTRAVENOUS
  Administered 2022-05-24 (×2): 40 ug/kg/min via INTRAVENOUS
  Administered 2022-05-24: 50 ug/kg/min via INTRAVENOUS
  Administered 2022-05-24 (×3): 40 ug/kg/min via INTRAVENOUS
  Administered 2022-05-25 (×3): 50 ug/kg/min via INTRAVENOUS
  Filled 2022-05-21 (×21): qty 100

## 2022-05-21 MED ORDER — SENNOSIDES-DOCUSATE SODIUM 8.6-50 MG PO TABS: 1.0000 | ORAL_TABLET | Freq: Every evening | ORAL | Status: DC | PRN

## 2022-05-21 MED ORDER — DEXMEDETOMIDINE HCL IN NACL 400 MCG/100ML IV SOLN: 0.0000 ug/kg/h | INTRAVENOUS | Status: DC

## 2022-05-21 MED ORDER — VITAL 1.5 CAL PO LIQD
1000.0000 mL | ORAL | Status: DC
  Administered 2022-05-21 – 2022-05-24 (×4): 1000 mL

## 2022-05-21 MED ORDER — APIXABAN 5 MG PO TABS: 5.0000 mg | ORAL_TABLET | Freq: Two times a day (BID) | ORAL | Status: DC

## 2022-05-21 MED ORDER — STERILE WATER FOR INJECTION IV SOLN
INTRAVENOUS | Status: DC
  Filled 2022-05-21: qty 1000

## 2022-05-21 MED ORDER — ONDANSETRON HCL 4 MG/2ML IJ SOLN: 4.0000 mg | Freq: Four times a day (QID) | INTRAMUSCULAR | Status: AC | PRN

## 2022-05-21 MED ORDER — VASOPRESSIN 20 UNITS/100 ML INFUSION FOR SHOCK
0.0400 [IU]/min | INTRAVENOUS | Status: DC
  Filled 2022-05-21: qty 100

## 2022-05-21 MED ORDER — HEPARIN (PORCINE) 25000 UT/250ML-% IV SOLN: 900.0000 [IU]/h | INTRAVENOUS | Status: DC

## 2022-05-21 NOTE — Progress Notes (Signed)
RT transported pt from ICU 10 to CT scan and back to ICU 10 via vent without incident.

## 2022-05-21 NOTE — Progress Notes (Signed)
Eeg done 

## 2022-05-21 NOTE — Procedures (Signed)
Patient Name: Mitchell Dixon  MRN: [id number]  Epilepsy Attending: Lora Havens  Referring Physician/Provider: Bradly Bienenstock, NP  Date: 05/21/2022 Duration: 23.40 mins  Patient history: 67yo M getting EEG to evaluate for seizure.  Level of alertness:  comatose  AEDs during EEG study: Propofol  Technical aspects: This EEG study was done with scalp electrodes positioned according to the 10-20 International system of electrode placement. Electrical activity was reviewed with band pass filter of 1-70Hz , sensitivity of 7 uV/mm, display speed of 66mm/sec with a 60Hz  notched filter applied as appropriate. EEG data were recorded continuously and digitally stored.  Video monitoring was available and reviewed as appropriate.  Description: EEG showed continuous generalized 3 to 6 Hz theta-delta slowing with overriding 12-15hz  beta activity distributed symmetrically and diffusely.  Hyperventilation and photic stimulation were not performed.     ABNORMALITY - Continuous slow, generalized  IMPRESSION: This study is suggestive of moderate to severe diffuse encephalopathy, nonspecific etiology but likely related to sedation. No seizures or epileptiform discharges were seen throughout the recording.  Mitchell Dixon

## 2022-05-21 NOTE — Procedures (Signed)
Central Venous Catheter Insertion Procedure Note  Mitchell Dixon  [id number]  [birth year]  Date:05/21/22  Time:4:38 AM   Provider Performing:Katrese Shell A Ritaj Dullea   Procedure: Insertion of Non-tunneled Central Venous Catheter(36556) with US guidance [id number])   Indication(s) Medication administration and Difficult access  Consent Unable to obtain consent due to emergent nature of procedure.  Anesthesia Topical only with 1% lidocaine   Timeout Verified patient identification, verified procedure, site/side was marked, verified correct patient position, special equipment/implants available, medications/allergies/relevant history reviewed, required imaging and test results available.  Sterile Technique Maximal sterile technique including full sterile barrier drape, hand hygiene, sterile gown, sterile gloves, mask, hair covering, sterile ultrasound probe cover (if used).  Procedure Description Area of catheter insertion was cleaned with chlorhexidine and draped in sterile fashion.  With real-time ultrasound guidance a central venous catheter was placed into the right internal jugular vein. Nonpulsatile blood flow and easy flushing noted in all ports.  The catheter was sutured in place and sterile dressing applied.  Complications/Tolerance None; patient tolerated the procedure well. Chest X-ray is ordered to verify placement for internal jugular or subclavian cannulation.   Chest x-ray is not ordered for femoral cannulation.  EBL Minimal  Specimen(s) None   Rufina Falco, DNP, CCRN, FNP-C, AGACNP-BC Acute Care & Family Nurse Practitioner  [facility]  See Amion for personal pager PCCM on call pager [phone] until 7 am

## 2022-05-21 NOTE — Progress Notes (Addendum)
       CROSS COVER NOTE  NAME: Mitchell Dixon MRN: [id number] DOB : August 05, 1955    HPI/Events of Note   Report:rapid response. Patient became agonal after hygiene performed for bowel movement and complained of lower back pain  On review of chart: Paraplegic admitted for sepsis from urosepsis . Has neurogenic bladder    Assessment and  Interventions   Assessment  agonal respirations + pulse , BP 80/54 (69) Pupils 3 NR discongugate, dolls eyes absent. No response to sternal rub   Plan: Immediate transfer to ICU  E Ouma NP called and informed patient situation and emergent need for intubation  etc      Kathlene Cote NP [facility]  0630 am - Patients sister Reita May informed of patient change in condition transfer to ICU and need for intubation,, central line placement

## 2022-05-21 NOTE — Consult Note (Signed)
PHARMACY CONSULT NOTE - FOLLOW UP  Pharmacy Consult for Electrolyte Monitoring and Replacement   Recent Labs: Potassium (mmol/L)  Date Value  05/21/2022 3.5   Magnesium (mg/dL)  Date Value  05/21/2022 2.5 (H)   Calcium (mg/dL)  Date Value  05/21/2022 7.2 (L)   Albumin (g/dL)  Date Value  05/21/2022 1.9 (L)  10/19/2016 4.2   Phosphorus (mg/dL)  Date Value  05/21/2022 2.9   Sodium (mmol/L)  Date Value  05/21/2022 125 (L)  02/17/2017 141    Assessment: 67 yo M with PMH aortic dissection, h/o paraplegia 2/2 spinal cord ischemia, HTN, Afib on Eliquis, pacemaker presents with abdominal distention and anuria x 1 day. Patient found to be in Afib w/ RVR. Imaging notable for markedly dilated bowel loop and c/f sigmoid or cecal volvulus. Patient in AKI but UOP improving.  Goal of Therapy:  K >/= 4.0 and Mg >/= 2.0  Plan:  Potassium chloride 40 mEq per tube x 1 Follow up BMP, Mg with AM labs  Delena Bali ,PharmD Clinical Pharmacist 05/21/2022 4:39 PM

## 2022-05-21 NOTE — Progress Notes (Signed)
Pharmacy Antibiotic Note  Mitchell Dixon is a 67 y.o. male admitted on 05/17/2022 with sepsis.  Pharmacy has been consulted for Cefepime & Vancomycin dosing.  Plan: Cefepime 2 gm q24hr per indication & renal fxn.  --Vancomycin 1750 mg once per pt wt: 77.6 kg given 3/19 @ 2330. --Vancomycin discontinued 3/20.   --New Vancomycin consult entered 3/22. --Placed order for Vancomycin 1 gm x 1 dose --Suspected AKI (2/27 SCr 1.22); Variable Vancomycin dosing order placed.    Pharmacy will continue to follow and will adjust abx dosing whenever warranted.  Temp (24hrs), Avg:98.2 F (36.8 C), Min:97.9 F (36.6 C), Max:98.5 F (36.9 C)   Recent Labs  Lab 05/21/2022 2009 05/21/2022 2047 05/03/2022 2209 05/19/22 0930 05/19/22 1409 05/20/22 0912 05/21/22 0236  WBC 25.7*  --   --  25.0*  --  19.0* 15.7*  CREATININE 5.11*  --   --  4.22*  --  4.55* 4.49*  LATICACIDVEN  --    < > 6.8* 2.7* 2.3* 1.4 2.9*   < > = values in this interval not displayed.     Estimated Creatinine Clearance: 17.2 mL/min (A) (by C-G formula based on SCr of 4.49 mg/dL (H)).    No Known Allergies  Antimicrobials this admission: 3/20 Vancomycin >> 3/19 Cefepime >> x 7 days 3/19 Flagyl >> D/C 3/20  Microbiology results: 3/19 BCx: Pending 3/19 UCx: Pending   Thank you for allowing pharmacy to be a part of this patient's care.  Renda Rolls, PharmD, [facility] 05/21/2022 4:01 AM

## 2022-05-21 NOTE — Progress Notes (Signed)
Initial Nutrition Assessment  DOCUMENTATION CODES:   Not applicable  INTERVENTION:   Vital 1.5@50ml /hr- Initiate at 31ml/hr, once tolerating, advance by 55ml/hr q 8 hours until goal rate is reached.   ProSource TF 20- Give 50ml BID via tube, each supplement provides 80kcal and 20g of protein.   Free water flushes 109ml q4 hours to maintain tube patency   Regimen provides 1960kcal/day, 121g/day protein and 1087ml/day of free water.   Juven Fruit Punch BID via tube, each serving provides 95kcal and 2.5g of protein (amino acids glutamine and arginine)  Liquid MVI daily via tube   Pt at high refeed risk; recommend monitor potassium, magnesium and phosphorus labs daily until stable  Daily weights   NUTRITION DIAGNOSIS:   Inadequate oral intake related to inability to eat (pt sedated and ventilated) as evidenced by NPO status.  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  Pt sedated and ventilated. OGT in place with tip noted in gastric fundus. KUB reporting concerns for sigmoid or cecal volvulus. Pt did present with nausea and abdominal pain. OGT to LIS with minimal output. Plan today is to start trickle tube feeds. Pt is noted to be eating 30% of meals on 3/20. RD suspects pt with fairly good oral intake at baseline.   Medications reviewed and include: heparin, ceftriaxone, heparin, levophed, KCl, propofol, Na bicarbonate   Labs reviewed: Na 129(L), K 3.0(L), BUN 66(H), creat  4.37(H), P 2.9 wnl, Mg 2.5(H) Wbc- 15.7(H), Hgb 10.3(L), Hct 31.8(L) Cbgs- 119, 137, 138, 107 x 24 hrs   Patient is currently intubated on ventilator support MV: 10.9 L/min Temp (24hrs), Avg:97.8 F (36.6 C), Min:96.5 F (35.8 C), Max:98.5 F (36.9 C)  Propofol: 12.2 ml/hr- provides 322kcal/day   MAP- >54mmHg   UOP- 879ml   NUTRITION - FOCUSED PHYSICAL EXAM:  Flowsheet Row Most Recent Value  Orbital Region No depletion  Upper Arm Region No depletion  Thoracic and Lumbar Region No depletion  Buccal Region No depletion  Temple Region No depletion  Clavicle Bone Region No depletion  Clavicle and Acromion Bone Region No depletion  Scapular Bone Region No depletion  Dorsal Hand No depletion  Patellar Region No depletion  Anterior Thigh Region No depletion  Posterior Calf Region No depletion  Edema (RD Assessment) Moderate  Hair Reviewed  Eyes Reviewed  Mouth Reviewed  Skin Reviewed  Nails Reviewed   Diet Order:   Diet Order     None      EDUCATION NEEDS:   No education needs have been identified at this time  Skin:  Skin Assessment: Reviewed RN Assessment (skin tears)  Last BM:  3/21  Height:   Ht Readings from Last 1 Encounters:  05/21/22 5\' 10"  (1.778 m)    Weight:   Wt Readings from Last 1 Encounters:  05/21/22 81.3 kg    Ideal Body Weight:  67.7 kg (adjusted for paraplegia)  BMI:  Body mass index is 25.72 kg/m.  Estimated Nutritional Needs:   Kcal:  1837kcal/day  Protein:  115-130g/day  Fluid:  1.9-2.2L/day  Mitchell Glasheen MS, RD, LDN Please refer to AMION for RD and/or RD on-call/weekend/after hours pager  

## 2022-05-21 NOTE — Procedures (Signed)
Intubation Procedure Note  Zikora Dankers  CR:[id number]  January 11, 1956  Date:05/21/22  Time:4:38 AM   Provider Performing:Jehu Mccauslin A [place] [facility]   Procedure: Intubation (31500)  Indication(s) Respiratory Failure  Consent Unable to obtain consent due to emergent nature of procedure.  Anesthesia Etomidate, Fentanyl, and Rocuronium  Time Out Verified patient identification, verified procedure, site/side was marked, verified correct patient position, special equipment/implants available, medications/allergies/relevant history reviewed, required imaging and test results available.  Sterile Technique Usual hand hygeine, masks, and gloves were used  Procedure Description Patient positioned in bed supine.  Sedation given as noted above.  Patient was intubated with endotracheal tube using Glidescope.  View was Grade 1 full glottis .  Number of attempts was 1.  Colorimetric CO2 detector was consistent with tracheal placement.  Complications/Tolerance None; patient tolerated the procedure well. Chest X-ray is ordered to verify placement.  EBL Minimal  Specimen(s) None   Rufina Falco, DNP, CCRN, FNP-C, AGACNP-BC Acute Care & Family Nurse Practitioner  [facility]  See Amion for personal pager PCCM on call pager [phone] until 7 am

## 2022-05-21 NOTE — Consult Note (Addendum)
ANTICOAGULATION CONSULT NOTE - Initial Consult  Pharmacy Consult for heparin Indication: atrial fibrillation  No Known Allergies  Patient Measurements: Height: 5\' 10"  (177.8 cm) Weight: 81.3 kg (179 lb 3.7 oz) IBW/kg (Calculated) : 73 Heparin Dosing Weight: 81.3  Vital Signs: Temp: 98.1 F (36.7 C) (03/22 2000) Temp Source: Axillary (03/22 2000) BP: 108/71 (03/22 2115) Pulse Rate: 84 (03/22 2000)  Labs: Recent Labs    05/19/22 0328 05/19/22 0328 05/19/22 0930 05/20/22 0912 05/21/22 0236 05/21/22 0618 05/21/22 1236 05/21/22 1528 05/21/22 1945  HGB  --    < > 11.7* 10.8* 10.3*  --   --   --   --   HCT  --   --  36.5* 33.1* 31.8*  --   --   --   --   PLT  --   --  195 191 217  --   --   --   --   APTT  --   --   --   --   --   --  44*  --  >200*  LABPROT 28.4*  --   --   --   --   --  23.2*  --   --   INR 2.7*  --   --   --   --   --  2.1*  --   --   HEPARINUNFRC  --   --   --   --   --   --  >1.10*  --   --   CREATININE  --    < > 4.22* 4.55* 4.49* 4.37*  --  4.56*  --   TROPONINIHS 165*  --  108*  --  44* 49*  --   --   --    < > = values in this interval not displayed.     Estimated Creatinine Clearance: 16.9 mL/min (A) (by C-G formula based on SCr of 4.56 mg/dL (H)).   Medical History: Past Medical History:  Diagnosis Date   Ascending aortic dissection ([place])    a. 01/2017 s/p emergent repair @ [facility].   Cardiac pacemaker in situ    a. 03/2017 s/p MDT [place] (ser# [id number]).   Chronic HFrEF (heart failure with reduced ejection fraction) ([place])    a. 06/2016 Echo: EF 45%, diff HK, GrI DD; b. 03/2017 Echo: EF 55-60%, apical wma, Gr2 DD; c. 06/2017 Echo: EF 55-60%, apical, apical inf, and mid antsept wma; d. 07/2018 Echo: EF 45%, apical thinning & AK. Dil Asc Ao.   Essential hypertension    LV (left ventricular) mural thrombus    a. 06/2016 Echo: EF 45%, diff HK w/ thrombus (0.9 x 0.9 x.0.7cm), GrI DD, mild AI/MR.   Mixed hyperlipidemia    PAD  (peripheral artery disease) (HCC)    PAF (paroxysmal atrial fibrillation) (HCC)    a. CHA2DS2VASc = 5-->chronic eliquis.   Paraplegia ([place])    a.  Spinal cord ischemia and injury in the setting of aortic dissection in December 2018.   Stroke [facility]) 06/2016   Urinary incontinence    a. in setting of paraplegia - self catheterizes.    Medications:  PTA: Apixaban 5 mg BID (last dose on 3/21 at 2133) Inpatient: Heparin infusion 3/22 > Allergies: NKDA   Assessment: 67 yo M with PMH aortic dissection, paraplegia 2/2 spinal cord ischemia, PAD, HTN, Afib on Eliquis, pacemaker, neurogenic bladder w/ indwelling Foley presents with abdominal distention and anuria x 1 day. Was receiving apixaban  but currently held due to gross hematuria that is being managed via gentle Foley catheter irrigation per urology recs. Pharmacy asked to initiate heparin for Afib (CHADSVASc 5) which will enable [place] to quickly stop [facility] if hematuria worsens.  Date Time aPTT/HL Rate/Comment 3/22 1236 44/>1.10 Baseline labs 03/22 1945 >200 / --- Supratherapeutic / Hold x1 hr then 1200 >   Goal of Therapy:  Heparin level 0.3-0.7 units/ml Monitor platelets by anticoagulation protocol: Yes    Plan:  Spoke with RN who reports this to be a true aPTT drawn from opposite arm of heparin infusion Hold heparin infusion x1 hour, then start heparin infusion at 900 units/hr Check aPTT/Anti-Xa level in 8 hours and daily once consecutively therapeutic.  Titrate by aPTT's until lab correlation is noted, then titrate by anti-xa alone. Continue to monitor H&H and platelets daily while on heparin gtt.  [place] Pharmacist 05/21/2022 9:55 PM

## 2022-05-21 NOTE — Consult Note (Addendum)
PHARMACY CONSULT NOTE - FOLLOW UP  Pharmacy Consult for Electrolyte Monitoring and Replacement   Recent Labs: Potassium (mmol/L)  Date Value  05/21/2022 2.9 (L)   Magnesium (mg/dL)  Date Value  05/19/2022 1.8   Calcium (mg/dL)  Date Value  05/21/2022 6.9 (L)   Albumin (g/dL)  Date Value  05/21/2022 1.8 (L)  10/19/2016 4.2   Phosphorus (mg/dL)  Date Value  03/27/2022 3.1   Sodium (mmol/L)  Date Value  05/21/2022 127 (L)  02/17/2017 141    Assessment: 67 yo M with PMH aortic dissection, h/o paraplegia 2/2 spinal cord ischemia, HTN, Afib on Eliquis, pacemaker presents with abdominal distention and anuria x 1 day. Patient found to be in Afib w/ RVR. Imaging notable for markedly dilated bowel loop and c/f sigmoid or cecal volvulus. Patient in AKI but UOP improving.  Goal of Therapy:  K >/= 4.0 and Mg >/= 2.0  Plan:  Potassium chloride 10 mEq IV x 6 ordered Follow up BMP at 1400 Follow up BMP, Mg with AM labs  Delena Bali ,PharmD Clinical Pharmacist 05/21/2022 6:26 AM

## 2022-05-21 NOTE — Consult Note (Signed)
ANTICOAGULATION CONSULT NOTE - Initial Consult  Pharmacy Consult for heparin Indication: atrial fibrillation  No Known Allergies  Patient Measurements: Height: 5\' 10"  (177.8 cm) Weight: 81.3 kg (179 lb 3.7 oz) IBW/kg (Calculated) : 73 Heparin Dosing Weight: 81.3  Vital Signs: Temp: 97.5 F (36.4 C) (03/22 0600) Temp Source: Axillary (03/22 0600) BP: 92/69 (03/22 1115) Pulse Rate: 95 (03/22 1050)  Labs: Recent Labs    05/19/22 0328 05/19/22 0930 05/20/22 0912 05/21/22 0236 05/21/22 0618  HGB  --  11.7* 10.8* 10.3*  --   HCT  --  36.5* 33.1* 31.8*  --   PLT  --  195 191 217  --   LABPROT 28.4*  --   --   --   --   INR 2.7*  --   --   --   --   CREATININE  --  4.22* 4.55* 4.49* 4.37*  TROPONINIHS 165* 108*  --  44* 49*    Estimated Creatinine Clearance: 17.6 mL/min (A) (by C-G formula based on SCr of 4.37 mg/dL (H)).   Medical History: Past Medical History:  Diagnosis Date   Ascending aortic dissection ([place])    a. 01/2017 s/p emergent repair @ [facility].   Cardiac pacemaker in situ    a. 03/2017 s/p MDT [place] (ser# [id number]).   Chronic HFrEF (heart failure with reduced ejection fraction) ([place])    a. 06/2016 Echo: EF 45%, diff HK, GrI DD; b. 03/2017 Echo: EF 55-60%, apical wma, Gr2 DD; c. 06/2017 Echo: EF 55-60%, apical, apical inf, and mid antsept wma; d. 07/2018 Echo: EF 45%, apical thinning & AK. Dil Asc Ao.   Essential hypertension    LV (left ventricular) mural thrombus    a. 06/2016 Echo: EF 45%, diff HK w/ thrombus (0.9 x 0.9 x.0.7cm), GrI DD, mild AI/MR.   Mixed hyperlipidemia    PAD (peripheral artery disease) (HCC)    PAF (paroxysmal atrial fibrillation) (HCC)    a. CHA2DS2VASc = 5-->chronic eliquis.   Paraplegia ([place])    a.  Spinal cord ischemia and injury in the setting of aortic dissection in December 2018.   Stroke [facility]) 06/2016   Urinary incontinence    a. in setting of paraplegia - self catheterizes.    Medications:  Apixaban 5 mg  BID  Assessment: 67 yo M with PMH aortic dissection, paraplegia 2/2 spinal cord ischemia, PAD, HTN, Afib on Eliquis, pacemaker, neurogenic bladder w/ indwelling Foley presents with abdominal distention and anuria x 1 day. Was receiving apixaban but currently held due to gross hematuria that is being managed via gentle Foley catheter irrigation per urology recs. Pharmacy asked to initiate heparin for Afib (CHADSVASc 5) which will enable [place] to quickly stop [facility] if hematuria worsens.  Baseline Labs: aPTT - ordered; INR - ordered Hgb - 10.3; Plts - 217  Goal of Therapy:  Heparin level 0.3-0.7 units/ml Monitor platelets by anticoagulation protocol: Yes   Plan:  Last dose of apixaban 3/21 @ 2133 Bolus heparin 4000 units x 1 Initiate heparin 1200 un/hr aPTT in 8 hrs (CrCl <30) and will subsequently monitor via HL once aPTT and HL correlate CBC daily while on heparin  Delena Bali 05/21/2022,11:38 AM

## 2022-05-21 NOTE — Progress Notes (Signed)
@  approx. 0140 peri-care performed and pt repositioned. Pt endorsed lower back pain after care and this RN went to retrieve PRN APAP. Upon return to room, pt found to be unresponsive to sternal rub, agonal breathing, and hypotensive (80/60 per Dinam[facility]). Rapid Response and Provider on-call notified by Charge RN and pt transferred to ICU. Report given to ICU RN Webb Silversmith and pt moved to ICU bed 10.

## 2022-05-21 NOTE — Progress Notes (Signed)
[facility]  ROUNDING NOTE   Subjective:   Rapid response overnight. Moved to ICU, intubated. Was briefly required vasopressin and norepinephrine.   UOP 860mL Bicarb gtt Creatinine 4.37 (4.49)   Objective:  Vital signs in last 24 hours:  Temp:  [97.5 F (36.4 C)-98.5 F (36.9 C)] 97.5 F (36.4 C) (03/22 0600) Pulse Rate:  [58-121] 96 (03/22 0700) Resp:  [15-24] 20 (03/22 0700) BP: (49-143)/(31-107) 107/84 (03/22 0700) SpO2:  [98 %-100 %] 100 % (03/22 0700) FiO2 (%):  [35 %-100 %] 35 % (03/22 0740) Weight:  [81.3 kg] 81.3 kg (03/22 0240)  Weight change:  Filed Weights   05/03/2022 1943 05/21/22 0240  Weight: 77.6 kg 81.3 kg    Intake/Output: I/O last 3 completed shifts: In: 3137.9 [P.O.:1200; I.V.:1530.7; IV Piggyback:407.2] Out: 1165 [Urine:1165]   Intake/Output this shift:  Total I/O In: -  Out: 50 [Urine:50]  Physical Exam: General: Critically ill  Head: ETT  Eyes: Anicteric  Lungs:  PRVC FiO 35%  Heart: Regular rate and rhythm  Abdomen:  Soft,  Extremities:  No peripheral edema.  Neurologic: Intubated, sedated  Skin: No lesions  Access: None  GU Foley with red colored urine.    Basic Metabolic Panel: Recent Labs  Lab 05/07/2022 2009 05/19/22 0052 05/19/22 0930 05/20/22 0912 05/21/22 0236 05/21/22 0618  NA 130*  --  129* 126* 127* 129*  K 3.0*  --  3.2* 3.0* 2.9* 3.0*  CL 92*  --  102 98 101 98  CO2 11*  --  13* 13* 15* 16*  GLUCOSE 129*  --  69* 50* 124* 169*  BUN 62*  --  63* 65* 64* 66*  CREATININE 5.11*  --  4.22* 4.55* 4.49* 4.37*  CALCIUM 8.8*  --  7.5* 7.6* 6.9* 7.3*  MG  --  1.6* 1.8  --  2.5*  --   PHOS  --   --   --   --   --  2.9     Liver Function Tests: Recent Labs  Lab 05/22/2022 1945 05/21/22 0236 05/21/22 0618  AST 64* 35  --   ALT 27 16  --   ALKPHOS 145* 98  --   BILITOT 1.8* 1.0  --   PROT 7.5 5.5*  --   ALBUMIN 2.6* 1.8* 1.9*    No results for input(s): "LIPASE", "AMYLASE" in the last 168  hours. No results for input(s): "AMMONIA" in the last 168 hours.  CBC: Recent Labs  Lab 05/24/2022 2009 05/19/22 0930 05/20/22 0912 05/21/22 0236  WBC 25.7* 25.0* 19.0* 15.7*  NEUTROABS  --   --   --  9.5*  HGB 13.3 11.7* 10.8* 10.3*  HCT 42.7 36.5* 33.1* 31.8*  MCV 95.5 93.4 90.9 92.2  PLT 273 195 191 217     Cardiac Enzymes: No results for input(s): "CKTOTAL", "CKMB", "CKMBINDEX", "TROPONINI" in the last 168 hours.  BNP: Invalid input(s): "POCBNP"  CBG: Recent Labs  Lab 05/20/22 2001 05/20/22 2341 05/21/22 0155 05/21/22 0342 05/21/22 0735  GLUCAP 93 119* 107* 138* 137*     Microbiology: Results for orders placed or performed during the hospital encounter of 05/09/2022  Urine Culture     Status: Abnormal   Collection Time: 05/12/2022  8:19 PM   Specimen: Urine, Catheterized  Result Value Ref Range Status   Specimen Description   Final    URINE, CATHETERIZED Performed at [facility], [address], [place], [place] [address]    Special Requests  Final    NONE Performed at [facility], [address] [place], [place] [address]    Culture MULTIPLE SPECIES PRESENT, SUGGEST RECOLLECTION (A)  Final   Report Status 05/19/2022 FINAL  Final  Culture, blood (Routine X 2) w Reflex to ID Panel     Status: Abnormal (Preliminary result)   Collection Time: 05/24/2022 10:41 PM   Specimen: BLOOD RIGHT FOREARM  Result Value Ref Range Status   Specimen Description   Final    BLOOD RIGHT FOREARM Performed at [facility], [place] [address], [place], [place] [address]    Special Requests   Final    BOTTLES DRAWN AEROBIC AND ANAEROBIC Blood Culture results may not be optimal due to an inadequate volume of blood received in culture bottles Performed at [facility], [address] [place], [place] [address]    Culture  Setup Time   Final    GRAM NEGATIVE RODS IN BOTH AEROBIC AND ANAEROBIC BOTTLES Organism ID to follow CRITICAL RESULT  CALLED TO, READ BACK BY AND VERIFIED WITHAubery Lapping PHARMD 1152 05/19/22 HNM Performed at [facility], [address], [place], [place] [address]    Culture (A)  Final    ENTEROBACTER AEROGENES SUSCEPTIBILITIES TO FOLLOW Performed at [facility], [place] [address] [place], [place] [address]    Report Status PENDING  Incomplete  Blood Culture ID Panel (Reflexed)     Status: Abnormal   Collection Time: 05/29/2022 10:41 PM  Result Value Ref Range Status   Enterococcus faecalis NOT DETECTED NOT DETECTED Final   Enterococcus Faecium NOT DETECTED NOT DETECTED Final   Listeria monocytogenes NOT DETECTED NOT DETECTED Final   Staphylococcus species NOT DETECTED NOT DETECTED Final   Staphylococcus aureus (BCID) NOT DETECTED NOT DETECTED Final   Staphylococcus epidermidis NOT DETECTED NOT DETECTED Final   Staphylococcus lugdunensis NOT DETECTED NOT DETECTED Final   Streptococcus species NOT DETECTED NOT DETECTED Final   Streptococcus agalactiae NOT DETECTED NOT DETECTED Final   Streptococcus pneumoniae NOT DETECTED NOT DETECTED Final   Streptococcus pyogenes NOT DETECTED NOT DETECTED Final   A.calcoaceticus-baumannii NOT DETECTED NOT DETECTED Final   Bacteroides fragilis NOT DETECTED NOT DETECTED Final   Enterobacterales DETECTED (A) NOT DETECTED Final    Comment: Enterobacterales represent a large order of gram negative bacteria, not a single organism. CRITICAL RESULT CALLED TO, READ BACK BY AND VERIFIED WITH: JUSTIN MILLER [id number] 05/19/22 HNM    Enterobacter cloacae complex NOT DETECTED NOT DETECTED Final   Escherichia coli NOT DETECTED NOT DETECTED Final   Klebsiella aerogenes DETECTED (A) NOT DETECTED Final    Comment: CRITICAL RESULT CALLED TO, READ BACK BY AND VERIFIED WITH: JUSTIN MILLER [id number] 05/19/22 HNM    Klebsiella oxytoca NOT DETECTED NOT DETECTED Final   Klebsiella pneumoniae NOT DETECTED NOT DETECTED Final   Proteus species NOT DETECTED NOT  DETECTED Final   Salmonella species NOT DETECTED NOT DETECTED Final   Serratia marcescens NOT DETECTED NOT DETECTED Final   Haemophilus influenzae NOT DETECTED NOT DETECTED Final   Neisseria meningitidis NOT DETECTED NOT DETECTED Final   Pseudomonas aeruginosa NOT DETECTED NOT DETECTED Final   Stenotrophomonas maltophilia NOT DETECTED NOT DETECTED Final   Candida albicans NOT DETECTED NOT DETECTED Final   Candida auris NOT DETECTED NOT DETECTED Final   Candida glabrata NOT DETECTED NOT DETECTED Final   Candida krusei NOT DETECTED NOT DETECTED Final   Candida parapsilosis NOT DETECTED NOT DETECTED Final  Candida tropicalis NOT DETECTED NOT DETECTED Final   Cryptococcus neoformans/gattii NOT DETECTED NOT DETECTED Final   CTX-M ESBL NOT DETECTED NOT DETECTED Final   Carbapenem resistance IMP NOT DETECTED NOT DETECTED Final   Carbapenem resistance KPC NOT DETECTED NOT DETECTED Final   Carbapenem resistance NDM NOT DETECTED NOT DETECTED Final   Carbapenem resist OXA 48 LIKE NOT DETECTED NOT DETECTED Final   Carbapenem resistance VIM NOT DETECTED NOT DETECTED Final    Comment: Performed at [facility], [address] [place], [place] [address]  Culture, blood (Routine X 2) w Reflex to ID Panel     Status: None (Preliminary result)   Collection Time: 05/19/22  3:54 AM   Specimen: BLOOD  Result Value Ref Range Status   Specimen Description BLOOD LEFT HAND  Final   Special Requests   Final    BOTTLES DRAWN AEROBIC AND ANAEROBIC Blood Culture results may not be optimal due to an inadequate volume of blood received in culture bottles   Culture   Final    NO GROWTH 2 DAYS Performed at [facility], [address] [place], [place] [address]    Report Status PENDING  Incomplete  MRSA Next Gen by PCR, Nasal     Status: None   Collection Time: 05/21/22  3:22 AM   Specimen: Nasal Mucosa; Nasal Swab  Result Value Ref Range Status   MRSA by PCR Next Gen NOT DETECTED NOT  DETECTED Final    Comment: (NOTE) The GeneXpert MRSA Assay (FDA approved for NASAL specimens only), is one component of a comprehensive MRSA colonization surveillance program. It is not intended to diagnose MRSA infection nor to guide or monitor treatment for MRSA infections. Test performance is not FDA approved in patients less than 58 years old. Performed at [facility], [address] [place],  [address]     Coagulation Studies: Recent Labs    05/19/22 0328  LABPROT 28.4*  INR 2.7*     Urinalysis: Recent Labs    05/25/2022 2019  COLORURINE YELLOW*  LABSPEC 1.008  PHURINE 7.0  GLUCOSEU NEGATIVE  HGBUR LARGE*  BILIRUBINUR NEGATIVE  KETONESUR NEGATIVE  PROTEINUR 100*  NITRITE NEGATIVE  LEUKOCYTESUR LARGE*       Imaging: DG Chest Port 1 View  Result Date: 05/21/2022 CLINICAL DATA:  History of endotracheal tube. EXAM: PORTABLE CHEST 1 VIEW COMPARISON:  04/20/2022 at 0225 hours. FINDINGS: 0917 hours. Endotracheal tube tip projects over the midthoracic trachea. Enteric tube is coiled within the proximal stomach. Interval placement of a right IJ approach central venous catheter tip projects over the mid SVC. Defibrillator pads overlie the patient. No focal airspace opacity. Normal heart size. Stable mediastinal contours with prominence of the aortic knob. No pleural effusion or pneumothorax. IMPRESSION: 1. Interval placement of a right IJ approach central venous catheter with tip projecting over the mid SVC. Otherwise stable support apparatus. 2.  No evidence of acute cardiopulmonary disease. Electronically Signed   By: Emmit Alexanders M.D.   On: 05/21/2022 09:26   CT CHEST ABDOMEN PELVIS WO CONTRAST  Result Date: 05/21/2022 CLINICAL DATA:  67 year old male with history of thoracic aortic dissection. Follow-up study. EXAM: CT CHEST, ABDOMEN AND PELVIS WITHOUT CONTRAST TECHNIQUE: Multidetector CT imaging of the chest, abdomen and pelvis was performed following  the standard protocol without IV contrast. RADIATION DOSE REDUCTION: This exam was performed according to the departmental dose-optimization program which includes automated exposure control, adjustment of the mA and/or kV according to patient  size and/or use of iterative reconstruction technique. COMPARISON:  CT of the chest, abdomen and pelvis 03/20/2022. FINDINGS: CT CHEST FINDINGS Cardiovascular: Heart size is normal. There is no significant pericardial fluid, thickening or pericardial calcification. There is aortic atherosclerosis, as well as atherosclerosis of the great vessels of the mediastinum and the coronary arteries, including calcified atherosclerotic plaque in the left anterior descending, left circumflex and right coronary arteries. Status post median sternotomy for graft repair of the ascending thoracic aorta. Chronic thoracic aortic aneurysm/dissection again noted, with the mid arch measuring up to 5.2 cm in diameter, and the descending thoracic aorta measuring up to 4.6 cm in diameter, similar to the prior study. Right internal jugular central venous catheter with tip terminating in the distal superior vena cava. Electronic device in the right ventricular apex, presumably an implantable cardiac pacemaker. Mediastinum/Nodes: No high attenuation fluid collection in the mediastinum to suggest mediastinal hematoma. No pathologically enlarged mediastinal or hilar lymph nodes. Nasogastric tube extending into the stomach. Esophagus is otherwise unremarkable in appearance. No axillary lymphadenopathy. Lungs/Pleura: Small left pleural effusion (low-attenuation) lying dependently. No right pleural effusion. Dependent subsegmental atelectasis in the left lower lobe. No consolidative airspace disease. Diffuse bronchial wall thickening with moderate centrilobular and paraseptal emphysema. No definite suspicious appearing pulmonary nodules or masses are noted. Low lying endotracheal tube with tip in the proximal  right mainstem bronchus. Musculoskeletal: Median sternotomy wires. There are no aggressive appearing lytic or blastic lesions noted in the visualized portions of the skeleton. CT ABDOMEN PELVIS FINDINGS Hepatobiliary: No definite suspicious cystic or solid hepatic lesions are confidently identified on today's noncontrast CT examination. Unenhanced appearance of the gallbladder is unremarkable. Pancreas: No definite pancreatic mass or peripancreatic fluid collections or inflammatory changes are noted on today's noncontrast CT examination. Spleen: Unremarkable. Adrenals/Urinary Tract: Severe atrophy of the right kidney. Nonobstructive calculi are noted in the left renal collecting system, largest of which measures up to 1.3 cm in the upper pole. No definite calculi are noted along the course of either ureter. No hydroureteronephrosis. Bilateral adrenal glands are normal in appearance. There are numerous large calculi noted within the lumen of the urinary bladder which is completely decompressed, with Foley balloon catheter in place. Small amount of gas non dependently in the lumen of the urinary bladder, presumably iatrogenic. There appears to be at least 1 calculus within the lumen of the Foley balloon catheter best appreciated on sagittal image 75 of series 6. Additionally, in the penile urethra (sagittal image 79 of series 6) there are small calculi which appear to be adjacent to the Foley catheter. Stomach/Bowel: Tip of nasogastric tube is in the antral pre-pyloric region of the stomach. Stomach is otherwise unremarkable in appearance. No pathologic dilatation of small bowel or colon. Normal appendix. Vascular/Lymphatic: Aortic atherosclerosis with chronic dissection of the abdominal aorta poorly demonstrated on today's noncontrast CT examination. 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 Vascular stents are noted in the common iliac arteries bilaterally. No  definite lymphadenopathy noted in the abdomen or pelvis. Reproductive: Prostate gland and seminal vesicles are unremarkable in appearance. Other: Trace volume of ascites. No pneumoperitoneum. Body wall edema, most evident in the region of the pelvis. Musculoskeletal: There are no aggressive appearing lytic or blastic lesions noted in the visualized portions of the skeleton. IMPRESSION: 1. Grossly unchanged chronic aortic aneurysm/dissection status post graft repair of the ascending thoracic aorta, as detailed above. 2. Low-lying endotracheal tube with tip in the right mainstem  bronchus. Retraction of the tube approximately 5 cm for more optimal placement is recommended. 3. Multiple bladder calculi again noted. Bladder is decompressed secondary to indwelling Foley balloon catheter. There appears to be at least 1 calculus in the lumen of the Foley catheter, along with a cluster of several small calculi in the region of the penile urethra adjacent to the catheter, as above. Urologic consultation is recommended. 4. Nonobstructive calculi in the collecting system of the left kidney measuring up to 1.3 cm in the upper pole. No ureteral stones or hydroureteronephrosis. 5. New small left pleural effusion is simple in appearance lying dependently. 6. Severe atrophy of the right kidney again noted. 7. Additional incidental findings, as above. These results will be called to the ordering clinician or representative by the Radiologist Assistant, and communication documented in the PACS or [employer]. Electronically Signed   By: Vinnie Langton M.D.   On: 05/21/2022 08:28   CT HEAD WO CONTRAST (5MM)  Result Date: 05/21/2022 CLINICAL DATA:  67 year old male with encephalopathy. On chronic anticoagulation. EXAM: CT HEAD WITHOUT CONTRAST TECHNIQUE: Contiguous axial images were obtained from the base of the skull through the vertex without intravenous contrast. RADIATION DOSE REDUCTION: This exam was performed according  to the departmental dose-optimization program which includes automated exposure control, adjustment of the mA and/or kV according to patient size and/or use of iterative reconstruction technique. COMPARISON:  Brain MRI and Head CT 07/03/2016. 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 Other bilateral patchy right frontal horn and bilateral external capsule region white matter hypodensity is stable, along with conspicuous bilateral anterior temporal lobe subcortical white matter hypodensity. Underlying cerebral volume remains normal. No midline shift, ventriculomegaly, mass effect, evidence of mass lesion, intracranial hemorrhage or evidence of cortically based acute infarction. Vascular: Mild Calcified atherosclerosis at the skull base. No suspicious intracranial vascular hyperdensity. Skull: No acute osseous abnormality identified. Sinuses/Orbits: Visualized paranasal sinuses and mastoids are stable and well aerated. Other: Visualized orbits and scalp soft tissues are within normal limits. Retained secretions in the posterior nasal cavity and visible nasopharynx. Patient is intubated on the scout view. IMPRESSION: 1. No acute intracranial abnormality. 2. 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 Electronically Signed   By: Genevie Ann M.D.   On: 05/21/2022 05:33   DG Abd 1 View  Result Date: 05/21/2022 CLINICAL DATA:  Nasogastric tube placement EXAM: ABDOMEN - 1 VIEW COMPARISON:  CT 03/20/2022 FINDINGS: Nasogastric tube tip is within the gastric fundus. Markedly dilated loop of bowel has developed within the mid abdomen, incompletely included as the pelvis is excluded from view. This may represent changes related to sigmoid or cecal  volvulus. No free intraperitoneal gas. IMPRESSION: 1. Nasogastric tube tip within the gastric fundus. 2. Markedly dilated loop of bowel within the mid abdomen, incompletely included as the pelvis is excluded from view, raising the question of sigmoid or cecal volvulus. Correlation with clinical examination and possible CT imaging is recommended. Electronically Signed   By: Fidela Salisbury M.D.   On: 05/21/2022 02:49   DG Chest 1 View  Result Date: 05/21/2022 CLINICAL DATA:  Intubation, NG tube placement EXAM: CHEST  1 VIEW COMPARISON:  05/17/2022 FINDINGS: Endotracheal tube 2.7 cm above the carina. NG tube not visualized. Heart is normal size. Aneurysmal dilatation of  the aorta as seen on prior CT. Lungs clear. No effusions or pneumothorax. No acute bony abnormality. IMPRESSION: Endotracheal tube 2.7 cm above the carina.  NG tube not visualized. Aneurysmal dilatation of the thoracic aorta as seen on prior CT. No acute cardiopulmonary disease. Electronically Signed   By: Rolm Baptise M.D.   On: 05/21/2022 02:48   US RENAL  Result Date: 05/19/2022 CLINICAL DATA:  Acute kidney insufficiency EXAM: RENAL / URINARY TRACT ULTRASOUND COMPLETE COMPARISON:  CT 03/20/2022 FINDINGS: Right Kidney: Renal measurements: 2.6 x 1.8 x 1.8 = volume: 4 mL. Extremely echogenic and atrophic. No collecting system dilatation. This was seen on the prior CT. Left Kidney: Renal measurements: 12.2 x 7.0 x 5.8 = volume: 260 mL. No collecting system dilatation. No perinephric fluid. Echogenic shadowing area identified consistent with known stone in the upper pole measuring 11 mm by ultrasound. Larger by CT at 13 mm. Bladder: Underdistended. Other: None. IMPRESSION: Severely atrophic right kidney. Upper pole left-sided renal stone.  No collecting system dilatation. Electronically Signed   By: Jill Side M.D.   On: 05/19/2022 12:05     Medications:    ceFEPime (MAXIPIME) IV Stopped (05/20/22 2206)   norepinephrine (LEVOPHED) Adult  infusion Stopped (05/21/22 0427)   potassium chloride 10 mEq (05/21/22 0919)   propofol (DIPRIVAN) infusion 5 mcg/kg/min (05/21/22 1000)   sodium bicarbonate 150 mEq in sterile water 1,150 mL infusion 75 mL/hr at 05/21/22 1006   vasopressin Stopped (05/21/22 0427)    sodium chloride   Intravenous Once   apixaban  5 mg Per Tube BID   Chlorhexidine Gluconate Cloth  6 each Topical Daily   metoprolol tartrate  50 mg Per Tube BID   mouth rinse  15 mL Mouth Rinse Q2H   sodium bicarbonate  650 mg Per Tube BID   vancomycin variable dose per unstable renal function (pharmacist dosing)   Does not apply See admin instructions   acetaminophen **OR** acetaminophen, ipratropium-albuterol, levalbuterol, ondansetron **OR** ondansetron (ZOFRAN) IV, mouth rinse, senna-docusate  Assessment/ Plan:  Mr. Janard Gaither is a 67 y.o.  male  past medical conditions including atrial fibrillation on Eliquis, pacemaker, quadraplegia with spinal cord ischemia, PAD, and hypertension, who was admitted to [facility] on 04/30/2022 for Urinary retention [R33.9] Atrial fibrillation with RVR ([place]) [I48.91] Severe sepsis ([place]) [A41.9, R65.20] Urinary tract infection with hematuria, site unspecified [N39.0, R31.9] Sepsis, due to unspecified organism, unspecified whether acute organ dysfunction present ([place]) [A41.9]   Acute kidney injury likely secondary to bladder outlet obstruction with infection. Baseline creatinine 1.22 on  04/27/22. Foley catheter placed by ED staff.  No IV contrast exposure.  Suspect renal function secondary to obstructive uropathy and acute tubular necrosis.  - Renal function plateaued. No acute indication for dialysis - Continue IV bicarb infusion.   2.  Hyponatremia: secondary to acute kidney injury. Improving with IV fluids.   3.  Hypokalemia: with renal tubular acidosis type IV: improving with bicarb administration and potassium replacement.    4. Bacteremia: enterobacter growing. Empiric  vancomycin and cefepime.     LOS: 3 Garyn Arlotta 3/22/202410:14 AM

## 2022-05-21 NOTE — Consult Note (Signed)
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  Per ED reports, patient was found to be in A-fib with RVR with heart rate in the [id number] and systolic blood pressure in the 80s.  Patient was complaining of shortness of breath and dizziness.   ED Course: Initial vital signs showed HR of 179 beats/minute, BP 117/105 mm Hg, the RR 39 breaths/minute, and the oxygen saturation 95% on RA and a temperature of 97.34F (36.9C).   Pertinent Labs/Diagnostics Findings: Na+/ K+: 130/3.0 glucose: 129 BUN/Cr.:62/5.11 WBC: 25.7 Lactic acid: 8.1 Troponin: 52 BNP: 266 UA positive for UTI Imaging: See below Medications Administered: Cefepime, diltiazem, sodium chloride 2 L bolus, sodium chloride 500 mL bolus, diltiazem GGT.   Hospital Course: Patient was admitted to PCU under hospitalist service for management of sepsis secondary to suspected pneumonia and UTI, A-fib with RVR and AKI with multiple metabolic derangement. SEE SIGNIFICANT EVENTS BELOW  Past Medical History  Ascending aortic dissection ([place]) 01/2017 s/p emergent repair @ [facility]. Cardiac pacemaker in situ   03/2017 s/p [facility] [place] (ser# [id number]). Chronic HFrEF (heart failure with reduced ejection fraction) ([place])   06/2016 Echo: EF 45%, diff HK, GrI DD; b. 03/2017 Echo:  EF 55-60%, apical wma, Gr2 DD; c. 06/2017 Echo: EF 55-60%, apical, apical inf, and mid antsept wma; d. 07/2018 Echo: EF 45%, apical thinning & AK. Dil Asc Ao. Essential hypertension   LV (left ventricular) mural thrombus   06/2016 Echo: EF 45%, diff HK w/ thrombus (0.9 x 0.9 x.0.7cm), GrI DD, mild AI/MR. Mixed hyperlipidemia   PAD (peripheral artery disease) (H[facility])   PAF (paroxysmal atrial fibrillation) (H[facility])   CHA2DS2VASc = 5-->chronic eliquis. Paraplegia ([place])   Spinal cord ischemia and injury in the setting of aortic dissection in December 2018. Stroke [facility]) 06/2016 Urinary incontinence   in setting of paraplegia - self catheterizes.  Significant Hospital Events   3/19: Admitted to PCU with sepsis secondary to obstructive uropathy, AKI with multiple electrolyte derangements and A-fib with RVR. 3/20: Nephrology consulted recs no indication for dialysis continue to with IV fluids 3/21: Urology consulted for worsening gross hematuria recommends manual irrigation of the Foley holding Eliquis and trending CBC. ID consulted for Klebsiella aeruginosa bacteremia  3/22: Rapid response called for acute respiratory distress and complaint of lower back pain.  Patient transferred to the ICU and emergently intubated for airway protection.  Consults:  PCCM Nephrology Urology ID Procedures:  3/22: Intubation 3/22: Right IJ central line  Significant Diagnostic Tests:  3/20: Noncontrast CT head>Negative  3/20:CT Chest abd/pelvis 1. Status post repair of the ascending aorta. A dissection flap originates in the aortic arch just distal to the takeoff of the left subclavian artery and extends to the abdominal aortic bifurcation. There has been interval re-expansion of the true lumen since the prior CT. There is increase in the diameter of the aortic arch measuring approximately 4.5 cm (  previously 3.7 cm). No periaortic fluid collection. 2. 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 Vascular surgery consult is advised. 3. Emphysema.  Trace left pleural effusion. 4. No bowel obstruction. Normal appendix. 5. Multiple large stones within the urinary bladder. Diffuse thickened appearance of the bladder wall may be related to chronic bladder infection. Correlation with urinalysis recommended to exclude active cystitis.  elvis>  Micro Data:  3/19: Blood culture x2> GNR, BCID detects K. Aerogenes  3/22: Urine Culture> 3/22: MRSA PCR>> Neg   Antimicrobials:  3/20 Vancomycin >> 3/19 Cefepime >> x 7 days 3/19 Flagyl >> D/C 3/20   OBJECTIVE  Blood pressure 99/71, pulse (!) 107, temperature 97.9 F (36.6 C), temperature source Axillary, resp. rate 20, height 5\' 10"  (1.778 m), weight 81.3 kg, SpO2 100 %.    Vent Mode: PRVC FiO2 (%):  [100 %] 100 % Set Rate:  [20 bmp] 20 bmp Vt Set:  [500 mL] 500 mL PEEP:  [5 cmH20] 5 cmH20   Intake/Output Summary (Last 24 hours) at 05/21/2022 0440 Last data filed at 05/21/2022 0240 Gross per 24 hour  Intake 2417.2 ml  Output 825 ml  Net 1592.2 ml   Filed Weights   05/11/2022 1943 05/21/22 0240  Weight: 77.6 kg 81.3 kg   Physical Examination  GENERAL: 67 year-old critically ill patient lying in the bed intubated and sedated EYES: PEERLA. No scleral icterus. Extraocular muscles intact.  HEENT: Head atraumatic, normocephalic. Oropharynx and nasopharynx clear.  NECK:  No JVD, supple  LUNGS: Decreased breath sounds bilaterally.  Agonal respiration with moderate to severe use of accessory muscles of respiration.  CARDIOVASCULAR: S1, S2 normal. No murmurs, rubs, or gallops.  ABDOMEN: Distended with hypoactive bowel sounds EXTREMITIES: Bilateral lower extremity pitting edema .capillary refill is less than 3 seconds in all extremities. Pulses palpable distally. paraplegic NEUROLOGIC: The patient is intubated and sedated. Cranial nerves are intact.  SKIN: Warm to touch, multiple wounds, stage IV pressure  ulcer,bilateral heel and ankle ulcers     Labs/imaging that I havepersonally reviewed  (right click and "Reselect all SmartList Selections" daily)     Labs   CBC: Recent Labs  Lab 05/22/2022 2009 05/19/22 0930 05/20/22 0912 05/21/22 0236  WBC 25.7* 25.0* 19.0* 15.7*  NEUTROABS  --   --   --  9.5*  HGB 13.3 11.7* 10.8* 10.3*  HCT 42.7 36.5* 33.1* 31.8*  MCV 95.5 93.4 90.9 92.2  PLT 273 195 191 [id number]    Basic Metabolic Panel: Recent Labs  Lab 05/22/2022 2009 05/19/22 0052 05/19/22 0930 05/20/22 0912 05/21/22 0236  NA 130*  --  129* 126* 127*  K 3.0*  --  3.2* 3.0* 2.9*  CL 92*  --  102 98 101  CO2 11*  --  13* 13* 15*  GLUCOSE 129*  --  69* 50* 124*  BUN 62*  --  63* 65* 64*  CREATININE 5.11*  --  4.22* 4.55* 4.49*  CALCIUM 8.8*  --  7.5* 7.6* 6.9*  MG  --  1.6* 1.8  --   --    GFR: Estimated Creatinine Clearance: 17.2 mL/min (A) (by C-G formula based on SCr of 4.49 mg/dL (H)). Recent Labs  Lab 05/04/2022 2009 05/01/2022 2047 05/19/22 0930 05/19/22 1409 05/20/22 0912 05/21/22 0236  WBC 25.7*  --  25.0*  --  19.0* 15.7*  LATICACIDVEN  --    < > 2.7* 2.3* 1.4 2.9*   < > = values in this  interval not displayed.    Liver Function Tests: Recent Labs  Lab 05/30/2022 1945 05/21/22 0236  AST 64* 35  ALT 27 16  ALKPHOS 145* 98  BILITOT 1.8* 1.0  PROT 7.5 5.5*  ALBUMIN 2.6* 1.8*   No results for input(s): "LIPASE", "AMYLASE" in the last 168 hours. No results for input(s): "AMMONIA" in the last 168 hours.  ABG    Component Value Date/Time   PHART 7.29 (L) 05/21/2022 0412   PCO2ART 29 (L) 05/21/2022 0412   PO2ART 406 (H) 05/21/2022 0412   HCO3 13.9 (L) 05/21/2022 0412   ACIDBASEDEF 11.2 (H) 05/21/2022 0412   O2SAT 100 05/21/2022 0412     Coagulation Profile: Recent Labs  Lab 05/19/22 0328  INR 2.7*    Cardiac Enzymes: No results for input(s): "CKTOTAL", "CKMB", "CKMBINDEX", "TROPONINI" in the last 168 hours.  HbA1C: Hgb A1c MFr Bld  Date/Time  Value Ref Range Status  07/20/2016 06:38 PM 5.7 (H) 4.8 - 5.6 % Final    Comment:             Pre-diabetes: 5.7 - 6.4          Diabetes: >6.4          Glycemic control for adults with diabetes: <7.0     CBG: Recent Labs  Lab 05/20/22 1642 05/20/22 2001 05/20/22 2341 05/21/22 0155 05/21/22 0342  GLUCAP 101* 93 119* 107* 138*    Review of Systems:   Unable to be obtained secondary to the patient's intubated and sedated status.   Past Medical History  He,  has a past medical history of Ascending aortic dissection ([place]), Cardiac pacemaker in situ, Chronic HFrEF (heart failure with reduced ejection fraction) ([place]), Essential hypertension, LV (left ventricular) mural thrombus, Mixed hyperlipidemia, PAD (peripheral artery disease) ([place]), PAF (paroxysmal atrial fibrillation) ([place]), Paraplegia ([place]), Stroke ([place]) (06/2016), and Urinary incontinence.   Surgical History    Past Surgical History:  Procedure Laterality Date   DIALYSIS/PERMA CATHETER INSERTION N/A 02/25/2017   Procedure: DIALYSIS/PERMA CATHETER INSERTION;  Surgeon: Algernon Huxley, MD;  Location: [facility];  Service: Cardiovascular;  Laterality: N/A;   EMBOLECTOMY Right 02/23/2017   Procedure: EMBOLECTOMY;  Surgeon: Katha Cabal, MD;  Location: [facility];  Service: Vascular;  Laterality: Right;   FASCIECTOMY Right 02/23/2017   Procedure: FASCIECTOMY;  Surgeon: Katha Cabal, MD;  Location: [facility] OR[facility];  Service: Vascular;  Laterality: Right;   HERNIA REPAIR       Social History   reports that he has been smoking cigarettes. He has a 7.50 pack-year smoking history. He has never used smokeless tobacco. He reports that he does not drink alcohol and does not use drugs.   Family History   His family history includes Dementia in his mother; Heart failure in his mother and sister; Hypertension in his mother and sister; Pneumonia in his father.   Allergies No Known Allergies   Home Medications  Prior to  Admission medications   Medication Sig Start Date End Date Taking? Authorizing Provider  atorvastatin (LIPITOR) 40 MG tablet Take 40 mg by mouth every evening.   Yes [provider]  ELIQUIS 5 MG TABS tablet Take 5 mg by mouth 2 (two) times daily.   Yes [provider]  metoprolol tartrate (LOPRESSOR) 50 MG tablet Take 50 mg by mouth 2 (two) times daily.   Yes [provider]  budesonide (PULMICORT) 0.25 MG/2ML nebulizer solution Take 2 mLs (0.25 mg total) by nebulization every 6 (  six) hours. Patient not taking: Reported on 03/21/2022 02/26/17   Tukov-Yual, Arlyss Gandy, NP  ipratropium-albuterol (DUONEB) 0.5-2.5 (3) MG/3ML SOLN Take 3 mLs by nebulization every 6 (six) hours. Patient not taking: Reported on 03/21/2022 02/26/17   Erlene Quan, NP  Scheduled Meds:  sodium chloride   Intravenous Once   apixaban  5 mg Oral BID   Chlorhexidine Gluconate Cloth  6 each Topical Daily   metoprolol tartrate  50 mg Oral BID   sodium bicarbonate  650 mg Oral BID   vancomycin variable dose per unstable renal function (pharmacist dosing)   Does not apply See admin instructions   Continuous Infusions:  ceFEPime (MAXIPIME) IV Stopped (05/20/22 2206)   norepinephrine (LEVOPHED) Adult infusion Stopped (05/21/22 0427)   potassium chloride     vancomycin 1,000 mg (05/21/22 0523)   vasopressin Stopped (05/21/22 0427)   PRN Meds:.acetaminophen **OR** acetaminophen, ipratropium-albuterol, levalbuterol, ondansetron **OR** ondansetron (ZOFRAN) IV, senna-docusate   Active Hospital Problem list     Assessment & Plan:   # Acute Hypoxic Respiratory Failure suspect pulmonary Edema? Vs PE although has been on anticoagulation -Full vent support -Wean PEEP and FiO2 for sats greater than 90% -pulmonary hygiene -Follow chest x-ray, ABG prn.  -prn fentanyl, versed for RASS -1  -Unable to Obtain CTA to eval for new dissection due to creatine, will obtain CT chest.abd/pelvis    #Sepsis with Shock Secondary to complicated UTI concern for Obstructive uropathy #Klebsiella Aerogenes Bacteremia -F/u cultures, trend lactic/ PCT -Monitor WBC/ fever curve -IV antibiotics: cefepime & vancomycin  -IVF hydration as needed -Repeat blood cultures until clear -Pressors for MAP goal >65 -Strict I/O's -ID following   #AKI likely ATN in the setting of Obstructive Uropathy #Hypokalemia #Hyponatremia #NAGMA with Lactic Acidosis -trend Lactate -Monitor I&O's / urinary output -Follow BMP -Ensure adequate renal perfusion -Avoid nephrotoxic agents as able -Replace electrolytes as indicated -Nephrology following   #Gross Hematuria Hx of  Neurogenic Bladder with chronic Urinary retention now with concerns for Obstructive Uropathy and Bladder stones -Hold Eliquis -Monitor H&H -Gentle Foley catheter irrigation per Urology recs -Urology following   HFpEF (LVEF on 1/24 50-55%) Atrial Fibrillation with RVR~Resolved [id number] Aortic Dissection s/p repair @[facility] , pacemaker placement, HLD Last BNP only >200 -Continuous cardiac monitoring -Maintain MAP greater than 65 -IV Lasix as blood pressure and renal function permits -Hold BP meds -Holding Eliquis due to Hematuria   Best practice:  Diet:  NPO Pain/Anxiety/Delirium protocol (if indicated): Yes (RASS goal -1) VAP protocol (if indicated): Yes DVT prophylaxis: Systemic AC GI prophylaxis: N/A Glucose control:  SSI Yes Central venous access:  Yes, and it is still needed Arterial line:  N/A Foley:  Yes, and it is still needed Mobility:  bed rest  PT consulted: N/A Last date of multidisciplinary goals of care discussion [No family present] Code Status:  full code Disposition: ICU   = Goals of Care = Code Status Order: FULL  Primary Emergency ContactReita May, Home Phone: [phone] Wishes to pursue full aggressive treatment and intervention options, including CPR and intubation, but goals of care will be  addressed on going with family if that should become necessary.  Critical care time: 53 minutes        Rufina Falco DNP, CCRN, FNP-C, AGACNP-BC Acute Care & Family Nurse Practitioner [facility] PCCM on call pager [phone]

## 2022-05-21 NOTE — Progress Notes (Signed)
eLink Physician-Brief Progress Note Patient Name: Mitchell Dixon DOB: 1955/11/29 MRN: [id number]   Date of Service  05/21/2022  HPI/Events of Note  67 year old male with a history of peripheral arterial disease, essential hypertension, and aortic dissection who is now paraplegic with spinal cord ischemia on chronic anticoagulation who presented initially with acute urinary retention.  Recently, he presented with productive cough, shortness of breath and generalized weakness and was eventually treated for sepsis secondary to UTI.  He developed respiratory distress, disconjugate gaze and immediately required intubation.  Laboratory studies shows that he has chronic hyponatremia, metabolic acidosis with lactic acidosis, and leukocytosis which appears improved compared to recent.  Not able to personally review chest radiograph or abdominal x-ray but endotracheal tube 2.7 cm above the carina.  Recently patient was bacteremic with Enterobacter arginase and Klebsiella erogenous.   eICU Interventions  Patient is already on norepinephrine at 15 mcg.  Add vasopressin.    Given the sudden change in mentation, may be reasonable to pursue CT head Noncon.  Added this as stat.  Blood cultures positive on 3/19, polymicrobial-repeat these.  Currently on cefepime, we do not have sensitivities, would likely benefit from addition of vancomycin in the interim until repeat cultures can be verified.    Intervention Category Evaluation Type: New Patient Evaluation  Kelia Gibbon 05/21/2022, 3:19 AM

## 2022-05-21 NOTE — Progress Notes (Signed)
Date of Admission:  05/28/2022     ID: Mitchell Dixon is a 67 y.o. male Principal Problem:   Severe sepsis ([place]) Active Problems:   Hypertension   Hyperlipidemia   AKI (acute kidney injury) ([place])   Chronic complete paraplegia (HCC)   Hypokalemia   Neurogenic bladder   Atrophic kidney   Atrial fibrillation with RVR (HCC)   Hyponatremia   Elevated troponin   Metabolic acidosis   Hematuria   Urinary retention    Subjective: Pt became agonal last evening when he was being turned for cleaning- He had to be intubated and is in ICU Also was on pressors  Medications:   sodium chloride   Intravenous Once   Chlorhexidine Gluconate Cloth  6 each Topical Daily   [START ON 05/22/2022] feeding supplement (PROSource TF20)  60 mL Per Tube BID   free water  30 mL Per Tube Q4H   heparin  4,000 Units Intravenous Once   metoprolol tartrate  50 mg Per Tube BID   [START ON 05/22/2022] multivitamin  15 mL Per Tube Daily   [START ON 05/22/2022] nutrition supplement (JUVEN)  1 packet Per Tube BID BM   mouth rinse  15 mL Mouth Rinse Q2H    Objective: Vital signs in last 24 hours: Patient Vitals for the past 24 hrs:  BP Temp Temp src Pulse Resp SpO2 Height Weight  05/21/22 1230 111/75 -- -- -- 20 -- -- --  05/21/22 1215 109/75 -- -- -- 20 -- -- --  05/21/22 1200 95/75 -- -- -- 19 -- -- --  05/21/22 1148 (!) 79/60 -- -- -- 20 -- -- --  05/21/22 1147 (!) 82/69 -- -- -- 20 -- -- --  05/21/22 1145 (!) 79/67 -- -- -- (!) 21 -- -- --  05/21/22 1141 -- (!) 96.5 F (35.8 C) Axillary -- 20 -- -- --  05/21/22 1130 96/66 -- -- -- 20 -- -- --  05/21/22 1115 92/69 -- -- -- 20 -- -- --  05/21/22 1110 101/74 -- -- -- 20 -- -- --  05/21/22 1105 105/70 -- -- -- 20 -- -- --  05/21/22 1100 111/83 -- -- -- 20 -- -- --  05/21/22 1056 103/80 -- -- -- 20 -- -- --  05/21/22 1054 112/89 -- -- -- 20 -- -- --  05/21/22 1052 131/86 -- -- -- (!) 23 -- -- --  05/21/22 1050 123/74 -- -- 95 19 100 % -- --   05/21/22 1048 (!) 132/99 -- -- 92 20 100 % -- --  05/21/22 1046 (!) 144/107 -- -- 92 19 100 % -- --  05/21/22 1044 135/86 -- -- 95 (!) 24 100 % -- --  05/21/22 1042 (!) 142/100 -- -- 91 20 100 % -- --  05/21/22 1040 (!) 105/90 -- -- 91 (!) 21 100 % -- --  05/21/22 1038 (!) 76/67 -- -- -- 17 -- -- --  05/21/22 1036 (!) 69/59 -- -- -- 20 -- -- --  05/21/22 1035 (!) 65/48 -- -- -- 20 -- -- --  05/21/22 1033 (!) 66/58 -- -- -- (!) 21 -- -- --  05/21/22 1030 (!) 61/13 -- -- -- 20 -- -- --  05/21/22 1015 (!) 119/96 -- -- (!) 101 (!) 23 100 % -- --  05/21/22 1000 110/84 -- -- 100 (!) 23 100 % -- --  05/21/22 0930 123/81 -- -- 94 (!) 22 100 % -- --  05/21/22 0900 115/79 -- --  98 (!) 23 100 % -- --  05/21/22 0830 127/89 -- -- 100 18 100 % -- --  05/21/22 0815 125/87 -- -- 98 (!) 26 100 % -- --  05/21/22 0800 (!) 147/92 -- -- 95 20 99 % -- --  05/21/22 0745 -- -- -- 94 (!) 25 97 % -- --  05/21/22 0730 108/88 -- -- 95 (!) 21 96 % -- --  05/21/22 0700 107/84 -- -- 96 20 100 % -- --  05/21/22 0645 100/77 -- -- 93 20 99 % -- --  05/21/22 0630 106/84 -- -- 92 20 100 % -- --  05/21/22 0615 90/63 -- -- 90 20 100 % -- --  05/21/22 0600 108/80 (!) 97.5 F (36.4 C) Axillary 90 20 100 % -- --  05/21/22 0545 107/83 -- -- 91 20 100 % -- --  05/21/22 0530 116/89 -- -- 91 20 99 % -- --  05/21/22 0515 (!) 135/93 -- -- 93 20 98 % -- --  05/21/22 0500 (!) 143/106 -- -- (!) 101 (!) 24 100 % -- --  05/21/22 0445 (!) 122/93 -- -- 91 20 99 % -- --  05/21/22 0415 (!) 138/104 -- -- 89 20 100 % -- --  05/21/22 0400 (!) 143/107 -- -- 92 20 99 % -- --  05/21/22 0345 (!) 125/103 -- -- -- 20 -- -- --  05/21/22 0330 125/84 -- -- -- 20 -- -- --  05/21/22 0315 (!) 89/74 -- -- (!) 58 18 98 % -- --  05/21/22 0300 (!) 79/61 -- -- -- 20 -- -- --  05/21/22 0245 110/79 -- -- (!) 103 (!) 21 100 % -- --  05/21/22 0240 99/71 97.9 F (36.6 C) Axillary (!) 107 20 100 % 5\' 10"  (1.778 m) 81.3 kg  05/21/22 0235 101/71 -- -- (!)  112 20 100 % -- --  05/21/22 0230 (!) 86/69 -- -- (!) 117 20 100 % -- --  05/21/22 0225 (!) 71/58 -- -- (!) 120 20 100 % -- --  05/21/22 0224 (!) 72/48 -- -- (!) 121 20 100 % -- --  05/21/22 0220 (!) 49/31 -- -- (!) 108 20 100 % -- --  05/21/22 0215 (!) 63/53 -- -- -- 20 -- -- --  05/21/22 0211 -- -- -- -- -- 100 % -- --  05/20/22 2337 129/81 98.3 F (36.8 C) -- 93 18 98 % -- --  05/20/22 1941 125/81 98.3 F (36.8 C) -- 96 18 100 % -- --  05/20/22 1642 131/84 98.5 F (36.9 C) -- 91 19 100 % -- --     LDA Rt IJ  PHYSICAL EXAM:  General: intubated sedated, pressor Lungs:b/la ir entry Heart: irregular Abdomen: Soft, non-tender,not distended.  Foley Bowel sounds normal. No masses Extremities: edema legs Skin: No rashes or lesions. Or bruising Lymph: Cervical, supraclavicular normal. Neurologic: cannot assess  Lab Results    Latest Ref Rng & Units 05/21/2022    2:36 AM 05/20/2022    9:12 AM 05/19/2022    9:30 AM  CBC  WBC 4.0 - 10.5 K/uL 15.7  19.0  25.0   Hemoglobin 13.0 - 17.0 g/dL 10.3  10.8  11.7   Hematocrit 39.0 - 52.0 % 31.8  33.1  36.5   Platelets 150 - 400 K/uL 217  191  195        Latest Ref Rng & Units 05/21/2022    6:18 AM 05/21/2022    2:36 AM 05/20/2022  9:12 AM  CMP  Glucose 70 - 99 mg/dL 169  124  50   BUN 8 - 23 mg/dL 66  64  65   Creatinine 0.61 - 1.24 mg/dL 4.37  4.49  4.55   Sodium 135 - 145 mmol/L 129  127  126   Potassium 3.5 - 5.1 mmol/L 3.0  2.9  3.0   Chloride 98 - 111 mmol/L 98  101  98   CO2 22 - 32 mmol/L 16  15  13    Calcium 8.9 - 10.3 mg/dL 7.3  6.9  7.6   Total Protein 6.5 - 8.1 g/dL  5.5    Total Bilirubin 0.3 - 1.2 mg/dL  1.0    Alkaline Phos 38 - 126 U/L  98    AST 15 - 41 U/L  35    ALT 0 - 44 U/L  16        Microbiology: BC- enterobacter aerogenes Studies/Results: DG Chest Port 1 View  Result Date: 05/21/2022 CLINICAL DATA:  History of endotracheal tube. EXAM: PORTABLE CHEST 1 VIEW COMPARISON:  04/20/2022 at 0225 hours.  FINDINGS: 0917 hours. Endotracheal tube tip projects over the midthoracic trachea. Enteric tube is coiled within the proximal stomach. Interval placement of a right IJ approach central venous catheter tip projects over the mid SVC. Defibrillator pads overlie the patient. No focal airspace opacity. Normal heart size. Stable mediastinal contours with prominence of the aortic knob. No pleural effusion or pneumothorax. IMPRESSION: 1. Interval placement of a right IJ approach central venous catheter with tip projecting over the mid SVC. Otherwise stable support apparatus. 2.  No evidence of acute cardiopulmonary disease. Electronically Signed   By: Emmit Alexanders M.D.   On: 05/21/2022 09:26   CT CHEST ABDOMEN PELVIS WO CONTRAST  Result Date: 05/21/2022 CLINICAL DATA:  67 year old male with history of thoracic aortic dissection. Follow-up study. EXAM: CT CHEST, ABDOMEN AND PELVIS WITHOUT CONTRAST TECHNIQUE: Multidetector CT imaging of the chest, abdomen and pelvis was performed following the standard protocol without IV contrast. RADIATION DOSE REDUCTION: This exam was performed according to the departmental dose-optimization program which includes automated exposure control, adjustment of the mA and/or kV according to patient size and/or use of iterative reconstruction technique. COMPARISON:  CT of the chest, abdomen and pelvis 03/20/2022. FINDINGS: CT CHEST FINDINGS Cardiovascular: Heart size is normal. There is no significant pericardial fluid, thickening or pericardial calcification. There is aortic atherosclerosis, as well as atherosclerosis of the great vessels of the mediastinum and the coronary arteries, including calcified atherosclerotic plaque in the left anterior descending, left circumflex and right coronary arteries. Status post median sternotomy for graft repair of the ascending thoracic aorta. Chronic thoracic aortic aneurysm/dissection again noted, with the mid arch measuring up to 5.2 cm in  diameter, and the descending thoracic aorta measuring up to 4.6 cm in diameter, similar to the prior study. Right internal jugular central venous catheter with tip terminating in the distal superior vena cava. Electronic device in the right ventricular apex, presumably an implantable cardiac pacemaker. Mediastinum/Nodes: No high attenuation fluid collection in the mediastinum to suggest mediastinal hematoma. No pathologically enlarged mediastinal or hilar lymph nodes. Nasogastric tube extending into the stomach. Esophagus is otherwise unremarkable in appearance. No axillary lymphadenopathy. Lungs/Pleura: Small left pleural effusion (low-attenuation) lying dependently. No right pleural effusion. Dependent subsegmental atelectasis in the left lower lobe. No consolidative airspace disease. Diffuse bronchial wall thickening with moderate centrilobular and paraseptal emphysema. No definite suspicious appearing pulmonary nodules or masses are noted.  Low lying endotracheal tube with tip in the proximal right mainstem bronchus. Musculoskeletal: Median sternotomy wires. There are no aggressive appearing lytic or blastic lesions noted in the visualized portions of the skeleton. CT ABDOMEN PELVIS FINDINGS Hepatobiliary: No definite suspicious cystic or solid hepatic lesions are confidently identified on today's noncontrast CT examination. Unenhanced appearance of the gallbladder is unremarkable. Pancreas: No definite pancreatic mass or peripancreatic fluid collections or inflammatory changes are noted on today's noncontrast CT examination. Spleen: Unremarkable. Adrenals/Urinary Tract: Severe atrophy of the right kidney. Nonobstructive calculi are noted in the left renal collecting system, largest of which measures up to 1.3 cm in the upper pole. No definite calculi are noted along the course of either ureter. No hydroureteronephrosis. Bilateral adrenal glands are normal in appearance. There are numerous large calculi noted  within the lumen of the urinary bladder which is completely decompressed, with Foley balloon catheter in place. Small amount of gas non dependently in the lumen of the urinary bladder, presumably iatrogenic. There appears to be at least 1 calculus within the lumen of the Foley balloon catheter best appreciated on sagittal image 75 of series 6. Additionally, in the penile urethra (sagittal image 79 of series 6) there are small calculi which appear to be adjacent to the Foley catheter. Stomach/Bowel: Tip of nasogastric tube is in the antral pre-pyloric region of the stomach. Stomach is otherwise unremarkable in appearance. No pathologic dilatation of small bowel or colon. Normal appendix. Vascular/Lymphatic: Aortic atherosclerosis with chronic dissection of the abdominal aorta poorly demonstrated on today's noncontrast CT examination. 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 Vascular stents are noted in the common iliac arteries bilaterally. No definite lymphadenopathy noted in the abdomen or pelvis. Reproductive: Prostate gland and seminal vesicles are unremarkable in appearance. Other: Trace volume of ascites. No pneumoperitoneum. Body wall edema, most evident in the region of the pelvis. Musculoskeletal: There are no aggressive appearing lytic or blastic lesions noted in the visualized portions of the skeleton. IMPRESSION: 1. Grossly unchanged chronic aortic aneurysm/dissection status post graft repair of the ascending thoracic aorta, as detailed above. 2. Low-lying endotracheal tube with tip in the right mainstem bronchus. Retraction of the tube approximately 5 cm for more optimal placement is recommended. 3. Multiple bladder calculi again noted. Bladder is decompressed secondary to indwelling Foley balloon catheter. There appears to be at least 1 calculus in the lumen of the Foley catheter, along with a cluster of several small calculi in the region of the penile  urethra adjacent to the catheter, as above. Urologic consultation is recommended. 4. Nonobstructive calculi in the collecting system of the left kidney measuring up to 1.3 cm in the upper pole. No ureteral stones or hydroureteronephrosis. 5. New small left pleural effusion is simple in appearance lying dependently. 6. Severe atrophy of the right kidney again noted. 7. Additional incidental findings, as above. These results will be called to the ordering clinician or representative by the Radiologist Assistant, and communication documented in the PACS or [employer]. Electronically Signed   By: Vinnie Langton M.D.   On: 05/21/2022 08:28   CT HEAD WO CONTRAST (5MM)  Result Date: 05/21/2022 CLINICAL DATA:  67 year old male with encephalopathy. On chronic anticoagulation. EXAM: CT HEAD WITHOUT CONTRAST TECHNIQUE: Contiguous axial images were obtained from the base of the skull through the vertex without intravenous contrast. RADIATION DOSE REDUCTION: This exam was performed according to the departmental dose-optimization program which includes automated exposure control, adjustment of  the mA and/or kV according to patient size and/or use of iterative reconstruction technique. COMPARISON:  Brain MRI and Head CT 07/03/2016. 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 Other bilateral patchy right frontal horn and bilateral external capsule region white matter hypodensity is stable, along with conspicuous bilateral anterior temporal lobe subcortical white matter hypodensity. Underlying cerebral volume remains normal. No midline shift, ventriculomegaly, mass effect, evidence of mass lesion, intracranial hemorrhage or evidence of cortically based acute infarction. Vascular: Mild Calcified atherosclerosis at the skull base. No suspicious intracranial vascular hyperdensity. Skull: No acute osseous abnormality identified. Sinuses/Orbits: Visualized paranasal sinuses and mastoids are  stable and well aerated. Other: Visualized orbits and scalp soft tissues are within normal limits. Retained secretions in the posterior nasal cavity and visible nasopharynx. Patient is intubated on the scout view. IMPRESSION: 1. No acute intracranial abnormality. 2. 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 Electronically Signed   By: Genevie Ann M.D.   On: 05/21/2022 05:33   DG Abd 1 View  Result Date: 05/21/2022 CLINICAL DATA:  Nasogastric tube placement EXAM: ABDOMEN - 1 VIEW COMPARISON:  CT 03/20/2022 FINDINGS: Nasogastric tube tip is within the gastric fundus. Markedly dilated loop of bowel has developed within the mid abdomen, incompletely included as the pelvis is excluded from view. This may represent changes related to sigmoid or cecal volvulus. No free intraperitoneal gas. IMPRESSION: 1. Nasogastric tube tip within the gastric fundus. 2. Markedly dilated loop of bowel within the mid abdomen, incompletely included as the pelvis is excluded from view, raising the question of sigmoid or cecal volvulus. Correlation with clinical examination and possible CT imaging is recommended. Electronically Signed   By: Fidela Salisbury M.D.   On: 05/21/2022 02:49   DG Chest 1 View  Result Date: 05/21/2022 CLINICAL DATA:  Intubation, NG tube placement EXAM: CHEST  1 VIEW COMPARISON:  05/15/2022 FINDINGS: Endotracheal tube 2.7 cm above the carina. NG tube not visualized. Heart is normal size. Aneurysmal dilatation of the aorta as seen on prior CT. Lungs clear. No effusions or pneumothorax. No acute bony abnormality. IMPRESSION: Endotracheal tube 2.7 cm above the carina.  NG tube not visualized. Aneurysmal dilatation of the thoracic aorta as seen on prior CT. No acute  cardiopulmonary disease. Electronically Signed   By: Rolm Baptise M.D.   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

## 2022-05-22 ENCOUNTER — Inpatient Hospital Stay: Payer: Medicaid Other

## 2022-05-22 DIAGNOSIS — A419 Sepsis, unspecified organism: Secondary | ICD-10-CM | POA: Diagnosis not present

## 2022-05-22 DIAGNOSIS — N179 Acute kidney failure, unspecified: Secondary | ICD-10-CM | POA: Diagnosis not present

## 2022-05-22 DIAGNOSIS — J9601 Acute respiratory failure with hypoxia: Secondary | ICD-10-CM | POA: Diagnosis not present

## 2022-05-22 DIAGNOSIS — I4891 Unspecified atrial fibrillation: Secondary | ICD-10-CM | POA: Diagnosis not present

## 2022-05-22 LAB — BASIC METABOLIC PANEL
Anion gap: 9 (ref 5–15)
BUN: 69 mg/dL — ABNORMAL HIGH (ref 8–23)
CO2: 16 mmol/L — ABNORMAL LOW (ref 22–32)
Calcium: 7.2 mg/dL — ABNORMAL LOW (ref 8.9–10.3)
Chloride: 100 mmol/L (ref 98–111)
Creatinine, Ser: 4.59 mg/dL — ABNORMAL HIGH (ref 0.61–1.24)
GFR, Estimated: 14 mL/min — ABNORMAL LOW (ref >60)
Glucose, Bld: 136 mg/dL — ABNORMAL HIGH (ref 70–99)
Potassium: 3.5 mmol/L (ref 3.5–5.1)
Sodium: 125 mmol/L — ABNORMAL LOW (ref 135–145)

## 2022-05-22 LAB — GLUCOSE, CAPILLARY
Glucose-Capillary: 101 mg/dL — ABNORMAL HIGH (ref 70–99)
Glucose-Capillary: 127 mg/dL — ABNORMAL HIGH (ref 70–99)
Glucose-Capillary: 128 mg/dL — ABNORMAL HIGH (ref 70–99)
Glucose-Capillary: 102 mg/dL — ABNORMAL HIGH (ref 70–99)
Glucose-Capillary: 95 mg/dL (ref 70–99)
Glucose-Capillary: 84 mg/dL (ref 70–99)

## 2022-05-22 LAB — RPR: RPR Ser Ql: NONREACTIVE

## 2022-05-22 LAB — BLOOD GAS, ARTERIAL
Acid-base deficit: 9.6 mmol/L — ABNORMAL HIGH (ref 0.0–2.0)
Bicarbonate: 14.6 mmol/L — ABNORMAL LOW (ref 20.0–28.0)
FIO2: 35 %
MECHVT: 500 mL
Mechanical Rate: 20
O2 Saturation: 100 %
PEEP: 5 cmH2O
Patient temperature: 37
pCO2 arterial: 27 mmHg — ABNORMAL LOW (ref 32–48)
pH, Arterial: 7.34 — ABNORMAL LOW (ref 7.35–7.45)
pO2, Arterial: 134 mmHg — ABNORMAL HIGH (ref 83–108)

## 2022-05-22 LAB — CULTURE, BLOOD (ROUTINE X 2)

## 2022-05-22 LAB — CBC
HCT: 28.6 % — ABNORMAL LOW (ref 39.0–52.0)
Hemoglobin: 9.2 g/dL — ABNORMAL LOW (ref 13.0–17.0)
MCH: 30 pg (ref 26.0–34.0)
MCHC: 32.2 g/dL (ref 30.0–36.0)
MCV: 93.2 fL (ref 80.0–100.0)
Platelets: 198 10*3/uL (ref 150–400)
RBC: 3.07 MIL/uL — ABNORMAL LOW (ref 4.22–5.81)
RDW: 16 % — ABNORMAL HIGH (ref 11.5–15.5)
WBC: 21.6 10*3/uL — ABNORMAL HIGH (ref 4.0–10.5)
nRBC: 0 % (ref 0.0–0.2)

## 2022-05-22 LAB — APTT
aPTT: >200 seconds (ref 24–36)
aPTT: 98 seconds — ABNORMAL HIGH (ref 24–36)

## 2022-05-22 LAB — MAGNESIUM: Magnesium: 2.5 mg/dL — ABNORMAL HIGH (ref 1.7–2.4)

## 2022-05-22 LAB — PHOSPHORUS: Phosphorus: 2.4 mg/dL — ABNORMAL LOW (ref 2.5–4.6)

## 2022-05-22 LAB — TRIGLYCERIDES: Triglycerides: 197 mg/dL — ABNORMAL HIGH (ref <150)

## 2022-05-22 LAB — HEPARIN LEVEL (UNFRACTIONATED): Heparin Unfractionated: >1.1 IU/mL — ABNORMAL HIGH (ref 0.30–0.70)

## 2022-05-22 MED ORDER — PANTOPRAZOLE SODIUM 40 MG IV SOLR
40.0000 mg | Freq: Every day | INTRAVENOUS | Status: DC
  Administered 2022-05-22 – 2022-05-25 (×4): 40 mg via INTRAVENOUS
  Filled 2022-05-22 (×4): qty 10

## 2022-05-22 MED ORDER — HEPARIN (PORCINE) 25000 UT/250ML-% IV SOLN
750.0000 [IU]/h | INTRAVENOUS | Status: DC
  Administered 2022-05-22: 700 [IU]/h via INTRAVENOUS
  Administered 2022-05-24: 750 [IU]/h via INTRAVENOUS
  Filled 2022-05-22 (×2): qty 250

## 2022-05-22 MED ORDER — POTASSIUM CHLORIDE 20 MEQ PO PACK
20.0000 meq | PACK | Freq: Once | ORAL | Status: AC
  Administered 2022-05-22: 20 meq
  Filled 2022-05-22: qty 1

## 2022-05-22 MED ORDER — ALBUMIN HUMAN 25 % IV SOLN
12.5000 g | Freq: Two times a day (BID) | INTRAVENOUS | Status: DC
  Administered 2022-05-22 – 2022-05-25 (×7): 12.5 g via INTRAVENOUS
  Filled 2022-05-22 (×7): qty 50

## 2022-05-22 NOTE — Plan of Care (Signed)
Discussed in front of patient shift change, plan of care for the evening and pain management with no evidence of learning at this time.  Problem: Education: Goal: Knowledge of General Education information will improve Description: Including pain rating scale, medication(s)/side effects and non-pharmacologic comfort measures Outcome: Not Progressing   Problem: Health Behavior/Discharge Planning: Goal: Ability to manage health-related needs will improve Outcome: Not Progressing

## 2022-05-22 NOTE — Progress Notes (Addendum)
Dr. Genia Harold at bedside. Discussed ABG result with RT and this RN. MD instructed RN to resume sedation and no SBT today due to ABG result. Discussed with MD blood in urine. MD instructed RN to irrigate foley and perform bladder scan once.

## 2022-05-22 NOTE — Consult Note (Signed)
ANTICOAGULATION CONSULT NOTE   Pharmacy Consult for heparin Indication: atrial fibrillation  No Known Allergies  Patient Measurements: Height: 5\' 10"  (177.8 cm) Weight: 82.6 kg (182 lb 1.6 oz) IBW/kg (Calculated) : 73 Heparin Dosing Weight: 81.3  Vital Signs: Temp: 97.9 F (36.6 C) (03/23 0800) Temp Source: Oral (03/23 0800) BP: 123/78 (03/23 0930) Pulse Rate: 114 (03/23 0845)  Labs: Recent Labs    05/20/22 0912 05/21/22 0236 05/21/22 0618 05/21/22 1236 05/21/22 1528 05/21/22 1945 05/22/22 0417 05/22/22 0845  HGB 10.8* 10.3*  --   --   --   --  9.2*  --   HCT 33.1* 31.8*  --   --   --   --  28.6*  --   PLT 191 217  --   --   --   --  198  --   APTT  --   --   --  44*  --  >200*  --   --   LABPROT  --   --   --  23.2*  --   --   --   --   INR  --   --   --  2.1*  --   --   --   --   HEPARINUNFRC  --   --   --  >1.10*  --   --   --  >1.10*  CREATININE 4.55* 4.49* 4.37*  --  4.56*  --  4.59*  --   TROPONINIHS  --  44* 49*  --   --   --   --   --      Estimated Creatinine Clearance: 16.8 mL/min (A) (by C-G formula based on SCr of 4.59 mg/dL (H)).   Medical History: Past Medical History:  Diagnosis Date   Ascending aortic dissection ([place])    a. 01/2017 s/p emergent repair @ [facility].   Cardiac pacemaker in situ    a. 03/2017 s/p MDT [place] (ser# [id number]).   Chronic HFrEF (heart failure with reduced ejection fraction) ([place])    a. 06/2016 Echo: EF 45%, diff HK, GrI DD; b. 03/2017 Echo: EF 55-60%, apical wma, Gr2 DD; c. 06/2017 Echo: EF 55-60%, apical, apical inf, and mid antsept wma; d. 07/2018 Echo: EF 45%, apical thinning & AK. Dil Asc Ao.   Essential hypertension    LV (left ventricular) mural thrombus    a. 06/2016 Echo: EF 45%, diff HK w/ thrombus (0.9 x 0.9 x.0.7cm), GrI DD, mild AI/MR.   Mixed hyperlipidemia    PAD (peripheral artery disease) (HCC)    PAF (paroxysmal atrial fibrillation) (HCC)    a. CHA2DS2VASc = 5-->chronic eliquis.   Paraplegia  ([place])    a.  Spinal cord ischemia and injury in the setting of aortic dissection in December 2018.   Stroke [facility]) 06/2016   Urinary incontinence    a. in setting of paraplegia - self catheterizes.    Medications:  PTA: Apixaban 5 mg BID (last dose on 3/21 at 2133) Inpatient: Heparin infusion 3/22 > Allergies: NKDA   Assessment: 67 yo M with PMH aortic dissection, paraplegia 2/2 spinal cord ischemia, PAD, HTN, Afib on Eliquis, pacemaker, neurogenic bladder w/ indwelling Foley presents with abdominal distention and anuria x 1 day. Was receiving apixaban but currently held due to gross hematuria that is being managed via gentle Foley catheter irrigation per urology recs. Pharmacy asked to initiate heparin for Afib (CHADSVASc 5) which will enable [place] to quickly stop [facility] if hematuria  worsens.  Date Time aPTT/HL Rate/Comment 3/22 1236 44/>1.10 Baseline labs 03/22 1945 >200 / --- Supratherapeutic / Hold x1 hr then 1200 >  3/23 0850 > 200/> 1.1 Supratherapeutic.   Goal of Therapy:  Heparin level 0.3-0.7 units/ml once correlation with heparin level and aPTT aPTT 66-102 seconds Monitor platelets by anticoagulation protocol: Yes    Plan:  aPTT is supratherapetic. Will hold heparin infusion for 2 hours. And restart at 700 units/hr. Recheck aPTT in 8 hours. CBC daily while on heparin infusion. Switch to heparin level monitoring once aPTT and heparin level correlate.    Oswald Hillock, PharmD, BCPS Clinical Pharmacist 05/22/2022 10:09 AM

## 2022-05-22 NOTE — Consult Note (Addendum)
PHARMACY CONSULT NOTE - FOLLOW UP  Pharmacy Consult for Electrolyte Monitoring and Replacement   Recent Labs: Potassium (mmol/L)  Date Value  05/22/2022 3.5   Magnesium (mg/dL)  Date Value  05/22/2022 2.5 (H)   Calcium (mg/dL)  Date Value  05/22/2022 7.2 (L)   Albumin (g/dL)  Date Value  05/21/2022 1.9 (L)  10/19/2016 4.2   Phosphorus (mg/dL)  Date Value  05/22/2022 2.4 (L)   Sodium (mmol/L)  Date Value  05/22/2022 125 (L)  02/17/2017 141    Assessment: 67 yo M with PMH aortic dissection, h/o paraplegia 2/2 spinal cord ischemia, HTN, Afib on Eliquis, pacemaker presents with abdominal distention and anuria x 1 day. Patient found to be in Afib w/ RVR. Imaging notable for markedly dilated bowel loop and c/f sigmoid or cecal volvulus. Patient in AKI but UOP improving. Suspect renal function secondary to obstructive uropathy and acute tubular necrosis.  Pt is currently on a ventilator. Scr not improved.   Pt is on NaBicarb  75 ml/hr.  Pt is receiving nutrition supplement via tube BID. Tube feeds 50 ml/hr. Feeding supplement 60 ml BID,  Free water 30 ml q4H,   Goal of Therapy:  K >/= 4.0 and Mg >/= 2.0  Plan:  Pt in AKI, will defer replacement of phos. Will give Kcl 20 mEq x 1 Defer Na to nephro team.  F/u with AM labs.   Oswald Hillock ,PharmD Clinical Pharmacist 05/22/2022 7:35 AM

## 2022-05-22 NOTE — Progress Notes (Signed)
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   Per ED reports, patient was found to be in A-fib with RVR with heart rate in the [id number] and systolic blood pressure in the 80s.  Patient was complaining of shortness of breath and dizziness.   ED Course: Initial vital signs showed HR of 179 beats/minute, BP 117/105 mm Hg, the RR 39 breaths/minute, and the oxygen saturation 95% on RA and a temperature of 97.37F (36.9C).    Pertinent Labs/Diagnostics Findings: Na+/ K+: 130/3.0 glucose: 129 BUN/Cr.:62/5.11 WBC: 25.7 Lactic acid: 8.1 Troponin: 52 BNP: 266 UA positive for UTI Imaging: See below Medications Administered: Cefepime, diltiazem, sodium chloride 2 L bolus, sodium chloride 500 mL bolus, diltiazem GGT.    Hospital Course: Patient was admitted to PCU under hospitalist service for management of sepsis secondary to suspected pneumonia and UTI, A-fib with RVR and AKI with multiple metabolic derangement. SEE SIGNIFICANT EVENTS BELOW  Pertinent  Medical History  Ascending aortic dissection ([place]) 01/2017 s/p emergent repair @ [facility]. Cardiac pacemaker in situ              03/2017 s/p MDT [place] (ser# [id number]). Chronic HFrEF (heart failure with reduced ejection fraction) ([place])         06/2016 Echo: EF 45%, diff HK, GrI DD; b. 03/2017 Echo: EF 55-60%, apical  wma, Gr2 DD; c. 06/2017 Echo: EF 55-60%, apical, apical inf, and mid antsept wma; d. 07/2018 Echo: EF 45%, apical thinning & AK. Dil Asc Ao. Essential hypertension        LV (left ventricular) mural thrombus           06/2016 Echo: EF 45%, diff HK w/ thrombus (0.9 x 0.9 x.0.7cm), GrI DD, mild AI/MR. Mixed hyperlipidemia          PAD (peripheral artery disease) (HCC)      PAF (paroxysmal atrial fibrillation) (HCC)              CHA2DS2VASc = 5-->chronic eliquis. Paraplegia ([place])    Spinal cord ischemia and injury in the setting of aortic dissection in December 2018. Stroke [facility])        06/2016 Urinary incontinence           in setting of paraplegia - self catheterizes.  Significant Hospital Events: Including procedures, antibiotic start and stop dates in addition to other pertinent events   3/19: Admitted to PCU with sepsis secondary to obstructive uropathy, AKI with multiple electrolyte derangements and A-fib with RVR. 3/20: Nephrology consulted recs no indication for dialysis continue to with IV fluids 3/21: Urology consulted for worsening gross hematuria recommends manual irrigation of the Foley holding Eliquis and trending CBC. ID consulted for Klebsiella aeruginosa bacteremia  3/22: Rapid response called for acute respiratory distress and complaint of lower back pain.  Patient transferred to the ICU and emergently intubated for airway protection.  Interim History / Subjective:  Patient awakens with lightened sedation, follows  commands  Objective   Blood pressure 101/76, pulse (!) 113, temperature 97.9 F (36.6 C), temperature source Oral, resp. rate 20, height 5\' 10"  (1.778 m), weight 82.6 kg, SpO2 95 %.    Vent Mode: PRVC FiO2 (%):  [35 %] 35 % Set Rate:  [20 bmp] 20 bmp Vt Set:  [500 mL] 500 mL PEEP:  [5 cmH20] 5 cmH20 Plateau Pressure:  [13 cmH20] 13 cmH20   Intake/Output Summary (Last 24 hours) at 05/22/2022 0848 Last data filed at 05/22/2022 0800 Gross per 24 hour  Intake  2327.88 ml  Output 685 ml  Net 1642.88 ml   Filed Weights   05/04/2022 1943 05/21/22 0240 05/22/22 0340  Weight: 77.6 kg 81.3 kg 82.6 kg    Examination: Physical Exam Vitals reviewed.  Constitutional:      General: He is not in acute distress.    Appearance: He is ill-appearing.  HENT:     Head: Normocephalic.     Mouth/Throat:     Comments: ETT in place Eyes:     Pupils: Pupils are equal, round, and reactive to light.  Cardiovascular:     Rate and Rhythm: Normal rate and regular rhythm.     Pulses: Normal pulses.     Heart sounds: Normal heart sounds.  Pulmonary:     Breath sounds: Normal breath sounds.     Comments: Ventilated breath sounds Abdominal:     Palpations: Abdomen is soft.  Musculoskeletal:     Right lower leg: Edema present.     Left lower leg: Edema present.     Comments: anasarca  Neurological:     Comments: Sedated, follows commands. Able to move upper extremities    Assessment & Plan:   67 year old male with a history of multiple medical problems including paraplegia secondary to spinal cord ischemia, HFrEF, afib, and LV thrombus who presented to the hospital with urinary retention and developed sepsis due to gram negative bacteremia. Patient's mental status worsened with hypotension and a disconjugate gaze and was subsequently intubated for airway protection.   Neurology #Toxic Metabolic Encephalopathy   Sudden in onset, with associated shaking movements concerning for seizure disorder. Head CT also suggestive of possible CADASIL. We are unable to obtain an MRI due to incompatibility with his leadless pacemaker. Patient initiated on propofol for sedation for vent compliance. spot EEG (held sedation) with slowing consistent with encephalopathy. Will consider a consultation to neurology for a Ceribell device vs continuous EEG monitoring.  -goal RASS -1   Cardiovascular #Distributive Shock #Ischemic cardiomyopathy #History of LV  thrombus #Afib #History of Aortic dissection repair   Initially hypotensive in the setting of known bacteremia and sepsis, this however appeared to be improved with antibiotics. Currently hypotension is sedation related, and improves with the discontinuation of propofol. Most recent TTE from January of this year with recovered ejection fraction. Patient with history of Afib and will be on anti-coagulation (switched from eliquis to heparin gtt). Repeat imaging of the chest shows the size of his aorta to be similar, less concerning for a ruptured aneurysm/worsening dissection. Poor kidney function precludes [place] from being able to administer contrast. Hypotension is sedation related and he's on low dose nor-epinephrine with good response (not required when off prop).  -goal MAP > 65 mmHg -heparin gtt for afib and history of LV thrombus   Pulmonary #Acute Hypoxic Respiratory Failure   Secondary to encephalopathy, possible seizure. Intubated for airway protection, currently on minimal ventilator settings. Chest CT with atelectasis, but  no infiltrates or consolidations. Notable metabolic acidosis currently a barrier to extubation, will continue to closely monitor blood gases   -Full vent support, implement lung protective strategies -Plateau pressures less than 30 cm H20 -Wean FiO2 & PEEP as tolerated to maintain O2 sats >92% -Spontaneous Breathing Trials when respiratory parameters met and mental status permits -Implement VAP Bundle -Prn Bronchodilators -ABG today -CXR today   Gastrointestinal   initiated tube feeds, SUP   Renal #AKI #Metabolic acidosis #Urinary Retention   Renal consult appreciated. Patient with metabolic acidosis, no lactic acid or Beta-hydroxybutarate elevation. This is likely secondary to AKI and uremia. Initiated him on bicarbonate infusion yesterday but discontinued due to minimal urine output, overall positive fluid balance, and anasarca.   Initial presentation was  secondary to urinary tract infection secondary to retention. Foley catheter in place and urology following. PRN bladder scans in place, and foley catheter was flushed secondary to finding of calculi in the catheter. Patient is a poor candidate for dialysis. Will trial albumin today after discussion with our nephrology colleagues.   -repeat ABG today -closely monitor electrolytes, replete as indicated -renal consult for consideration of HD   Endocrine ICU glycemic protocol.   Hem/Onc On AC for history of LV thrombus and Afib. switched to heparin gtt given hematuria and for tighter control of AC. Hemoglobin has been slowly trending downwards.  -continue heparin gtt -goal H/H > 7   ID #Sepsis #Gram Negative Bacteremia #UTI   On broad spectrum antibiotics, blood cultures were positive and repeats are no growth to date. Continue with Cefepime.  Best Practice (right click and "Reselect all SmartList Selections" daily)   Diet/type: tubefeeds DVT prophylaxis: systemic heparin GI prophylaxis: PPI Lines: Central line Foley:  Yes, and it is still needed Code Status:  full code Last date of multidisciplinary goals of care discussion [05/22/2022]  Labs   CBC: Recent Labs  Lab 05/19/2022 2009 05/19/22 0930 05/20/22 0912 05/21/22 0236 05/22/22 0417  WBC 25.7* 25.0* 19.0* 15.7* 21.6*  NEUTROABS  --   --   --  9.5*  --   HGB 13.3 11.7* 10.8* 10.3* 9.2*  HCT 42.7 36.5* 33.1* 31.8* 28.6*  MCV 95.5 93.4 90.9 92.2 93.2  PLT 273 195 191 217 [id number]    Basic Metabolic Panel: Recent Labs  Lab 05/19/22 0052 05/19/22 0930 05/20/22 0912 05/21/22 0236 05/21/22 0618 05/21/22 1528 05/22/22 0417  NA  --  129* 126* 127* 129* 125* 125*  K  --  3.2* 3.0* 2.9* 3.0* 3.5 3.5  CL  --  102 98 101 98 98 100  CO2  --  13* 13* 15* 16* 17* 16*  GLUCOSE  --  69* 50* 124* 169* 116* 136*  BUN  --  63* 65* 64* 66* 64* 69*  CREATININE  --  4.22* 4.55* 4.49* 4.37* 4.56* 4.59*  CALCIUM  --  7.5* 7.6* 6.9*  7.3* 7.2* 7.2*  MG 1.6* 1.8  --  2.5*  --   --  2.5*  PHOS  --   --   --   --  2.9  --  2.4*   GFR: Estimated Creatinine Clearance: 16.8 mL/min (A) (by C-G formula based on SCr of 4.59 mg/dL (H)). Recent Labs  Lab 05/19/22 0930 05/19/22 1409 05/20/22 0912 05/21/22 0236 05/21/22 0618 05/22/22 0417  WBC 25.0*  --  19.0* 15.7*  --  21.6*  LATICACIDVEN 2.7* 2.3* 1.4 2.9* 1.4  --     Liver Function Tests: Recent Labs  Lab  05/24/2022 1945 05/21/22 0236 05/21/22 0618  AST 64* 35  --   ALT 27 16  --   ALKPHOS 145* 98  --   BILITOT 1.8* 1.0  --   PROT 7.5 5.5*  --   ALBUMIN 2.6* 1.8* 1.9*   No results for input(s): "LIPASE", "AMYLASE" in the last 168 hours. No results for input(s): "AMMONIA" in the last 168 hours.  ABG    Component Value Date/Time   PHART 7.33 (L) 05/21/2022 1528   PCO2ART 28 (L) 05/21/2022 1528   PO2ART 153 (H) 05/21/2022 1528   HCO3 14.8 (L) 05/21/2022 1528   ACIDBASEDEF 9.6 (H) 05/21/2022 1528   O2SAT 99.8 05/21/2022 1528     Coagulation Profile: Recent Labs  Lab 05/19/22 0328 05/21/22 1236  INR 2.7* 2.1*    Cardiac Enzymes: No results for input(s): "CKTOTAL", "CKMB", "CKMBINDEX", "TROPONINI" in the last 168 hours.  HbA1C: Hgb A1c MFr Bld  Date/Time Value Ref Range Status  07/20/2016 06:38 PM 5.7 (H) 4.8 - 5.6 % Final    Comment:             Pre-diabetes: 5.7 - 6.4          Diabetes: >6.4          Glycemic control for adults with diabetes: <7.0     CBG: Recent Labs  Lab 05/21/22 1600 05/21/22 1911 05/21/22 2311 05/22/22 0330 05/22/22 0730  GLUCAP 92 72 82 101* 127*     Past Medical History:  He,  has a past medical history of Ascending aortic dissection ([place]), Cardiac pacemaker in situ, Chronic HFrEF (heart failure with reduced ejection fraction) ([place]), Essential hypertension, LV (left ventricular) mural thrombus, Mixed hyperlipidemia, PAD (peripheral artery disease) ([place]), PAF (paroxysmal atrial fibrillation) ([place]), Paraplegia  ([place]), Stroke ([place]) (06/2016), and Urinary incontinence.   Surgical History:   Past Surgical History:  Procedure Laterality Date   DIALYSIS/PERMA CATHETER INSERTION N/A 02/25/2017   Procedure: DIALYSIS/PERMA CATHETER INSERTION;  Surgeon: Algernon Huxley, MD;  Location: [place] [facility];  Service: Cardiovascular;  Laterality: N/A;   EMBOLECTOMY Right 02/23/2017   Procedure: EMBOLECTOMY;  Surgeon: Katha Cabal, MD;  Location: [facility] ORS;  Service: Vascular;  Laterality: Right;   FASCIECTOMY Right 02/23/2017   Procedure: FASCIECTOMY;  Surgeon: Katha Cabal, MD;  Location: [facility] OR[facility];  Service: Vascular;  Laterality: Right;   HERNIA REPAIR       Social History:   reports that he has been smoking cigarettes. He has a 7.50 pack-year smoking history. He has never used smokeless tobacco. He reports that he does not drink alcohol and does not use drugs.   Family History:  His family history includes Dementia in his mother; Heart failure in his mother and sister; Hypertension in his mother and sister; Pneumonia in his father.   Allergies No Known Allergies   Home Medications  Prior to Admission medications   Medication Sig Start Date End Date Taking? Authorizing Provider  atorvastatin (LIPITOR) 40 MG tablet Take 40 mg by mouth every evening.   Yes [provider]  ELIQUIS 5 MG TABS tablet Take 5 mg by mouth 2 (two) times daily.   Yes [provider]  metoprolol tartrate (LOPRESSOR) 50 MG tablet Take 50 mg by mouth 2 (two) times daily.   Yes [provider]  budesonide (PULMICORT) 0.25 MG/2ML nebulizer solution Take 2 mLs (0.25 mg total) by nebulization every 6 (six) hours. Patient not taking: Reported on 03/21/2022 02/26/17   Maura Crandall  S, NP  ipratropium-albuterol (DUONEB) 0.5-2.5 (3) MG/3ML SOLN Take 3 mLs by nebulization every 6 (six) hours. Patient not taking: Reported on 03/21/2022 02/26/17   Erlene Quan, NP     Critical care  time: 28 minutes    Armando Reichert, MD [facility] 05/22/2022 1:50 PM

## 2022-05-22 NOTE — Consult Note (Addendum)
ANTICOAGULATION CONSULT NOTE   Pharmacy Consult for heparin Indication: atrial fibrillation  No Known Allergies  Patient Measurements: Height: 5\' 10"  (177.8 cm) Weight: 82.6 kg (182 lb 1.6 oz) IBW/kg (Calculated) : 73 Heparin Dosing Weight: 81.3 kg  Vital Signs: Temp: 98.2 F (36.8 C) (03/23 1925) Temp Source: Oral (03/23 1925) BP: 126/60 (03/23 2130) Pulse Rate: 118 (03/23 1830)  Labs: Recent Labs    05/20/22 0912 05/21/22 0236 05/21/22 0618 05/21/22 1236 05/21/22 1236 05/21/22 1528 05/21/22 1945 05/22/22 0417 05/22/22 0845 05/22/22 1939  HGB 10.8* 10.3*  --   --   --   --   --  9.2*  --   --   HCT 33.1* 31.8*  --   --   --   --   --  28.6*  --   --   PLT 191 217  --   --   --   --   --  198  --   --   APTT  --   --   --  44*   < >  --  >200*  --  >200* 98*  LABPROT  --   --   --  23.2*  --   --   --   --   --   --   INR  --   --   --  2.1*  --   --   --   --   --   --   HEPARINUNFRC  --   --   --  >1.10*  --   --   --   --  >1.10*  --   CREATININE 4.55* 4.49* 4.37*  --   --  4.56*  --  4.59*  --   --   TROPONINIHS  --  44* 49*  --   --   --   --   --   --   --    < > = values in this interval not displayed.     Estimated Creatinine Clearance: 16.8 mL/min (A) (by C-G formula based on SCr of 4.59 mg/dL (H)).   Medical History: Past Medical History:  Diagnosis Date   Ascending aortic dissection ([place])    a. 01/2017 s/p emergent repair @ [facility].   Cardiac pacemaker in situ    a. 03/2017 s/p MDT [place] (ser# [id number]).   Chronic HFrEF (heart failure with reduced ejection fraction) ([place])    a. 06/2016 Echo: EF 45%, diff HK, GrI DD; b. 03/2017 Echo: EF 55-60%, apical wma, Gr2 DD; c. 06/2017 Echo: EF 55-60%, apical, apical inf, and mid antsept wma; d. 07/2018 Echo: EF 45%, apical thinning & AK. Dil Asc Ao.   Essential hypertension    LV (left ventricular) mural thrombus    a. 06/2016 Echo: EF 45%, diff HK w/ thrombus (0.9 x 0.9 x.0.7cm), GrI DD, mild AI/MR.    Mixed hyperlipidemia    PAD (peripheral artery disease) (HCC)    PAF (paroxysmal atrial fibrillation) (HCC)    a. CHA2DS2VASc = 5-->chronic eliquis.   Paraplegia ([place])    a.  Spinal cord ischemia and injury in the setting of aortic dissection in December 2018.   Stroke [facility]) 06/2016   Urinary incontinence    a. in setting of paraplegia - self catheterizes.    Medications:  PTA: Apixaban 5 mg BID (last dose on 3/21 at 2133) Inpatient: Heparin infusion 3/22 > Allergies: NKDA   Assessment: 67 yo M with PMH aortic  dissection, paraplegia 2/2 spinal cord ischemia, PAD, HTN, Afib on Eliquis, pacemaker, neurogenic bladder w/ indwelling Foley presents with abdominal distention and anuria x 1 day. Was receiving apixaban but currently held due to gross hematuria that is being managed via gentle Foley catheter irrigation per urology recs. Pharmacy asked to initiate heparin for Afib (CHADSVASc 5) which will enable [place] to quickly stop [facility] if hematuria worsens.  Date Time aPTT/HL Rate/Comment 3/22 1236 44/>1.10 Baseline labs 03/22 1945 >200 / --- Supratherapeutic / Hold x1 hr then 1200 > 900 u/hr 3/23 0850 > 200/> 1.1 Supratherapeutic / Hold x1 hr then 900 > 700 u/hr 3/23 1939 98 / ---  Therapeutic x1 / 700 u/hr  Goal of Therapy:  Heparin level 0.3-0.7 units/ml once correlation with heparin level and aPTT aPTT 66-102 seconds Monitor platelets by anticoagulation protocol: Yes    Plan:  Continue heparin infusion at 700 units/hr Check aPTT/Anti-Xa level in 8hours and daily once consecutively therapeutic.  Titrate by aPTT's until lab correlation is noted, then titrate by anti-xa alone. Continue to monitor H&H and platelets daily while on heparin gtt.   Darrick Penna, PharmD, BCPS Clinical Pharmacist 05/22/2022 10:03 PM

## 2022-05-22 NOTE — Progress Notes (Signed)
[facility]  ROUNDING NOTE   Subjective:   Patient is critically ill.  Intubated and sedated at present. Ventilator 35/5 Tube feeds at 30 cc/h Patient has significant dependent edema He is on IV heparin infusion, has blood-tinged urine in the Foley bag.    Objective:  Vital signs in last 24 hours:  Temp:  [97 F (36.1 C)-98.3 F (36.8 C)] 98.3 F (36.8 C) (03/23 1230) Pulse Rate:  [79-121] 119 (03/23 1215) Resp:  [16-25] 21 (03/23 1230) BP: (89-148)/(46-118) 118/74 (03/23 1230) SpO2:  [95 %-100 %] 100 % (03/23 1215) FiO2 (%):  [35 %] 35 % (03/23 1215) Weight:  [82.6 kg] 82.6 kg (03/23 0340)  Weight change: 1.3 kg Filed Weights   05/20/2022 1943 05/21/22 0240 05/22/22 0340  Weight: 77.6 kg 81.3 kg 82.6 kg    Intake/Output: I/O last 3 completed shifts: In: 4459.5 [P.O.:720; I.V.:2856.7; NG/GT:197.8; IV Piggyback:685] Out: 1075 [Urine:945; Emesis/NG output:130]   Intake/Output this shift:  Total I/O In: 867.9 [I.V.:476.9; Other:20; NG/GT:320; IV Piggyback:51] Out: 370 [Urine:370]  Physical Exam: General: Critically ill  Head: ETT  Eyes: Anicteric  Lungs:  PRVC FiO 35%  Heart: Regular rate and rhythm  Abdomen:  Soft,  Extremities:  ++ Dependent peripheral edema.  Neurologic: Intubated, sedated  Skin: No lesions  Access: None  GU Foley with red colored urine.    Basic Metabolic Panel: Recent Labs  Lab 05/19/22 0052 05/19/22 0930 05/20/22 0912 05/21/22 0236 05/21/22 0618 05/21/22 1528 05/22/22 0417  NA  --  129* 126* 127* 129* 125* 125*  K  --  3.2* 3.0* 2.9* 3.0* 3.5 3.5  CL  --  102 98 101 98 98 100  CO2  --  13* 13* 15* 16* 17* 16*  GLUCOSE  --  69* 50* 124* 169* 116* 136*  BUN  --  63* 65* 64* 66* 64* 69*  CREATININE  --  4.22* 4.55* 4.49* 4.37* 4.56* 4.59*  CALCIUM  --  7.5* 7.6* 6.9* 7.3* 7.2* 7.2*  MG 1.6* 1.8  --  2.5*  --   --  2.5*  PHOS  --   --   --   --  2.9  --  2.4*     Liver Function Tests: Recent Labs  Lab  05/19/2022 1945 05/21/22 0236 05/21/22 0618  AST 64* 35  --   ALT 27 16  --   ALKPHOS 145* 98  --   BILITOT 1.8* 1.0  --   PROT 7.5 5.5*  --   ALBUMIN 2.6* 1.8* 1.9*    No results for input(s): "LIPASE", "AMYLASE" in the last 168 hours. No results for input(s): "AMMONIA" in the last 168 hours.  CBC: Recent Labs  Lab 05/20/2022 2009 05/19/22 0930 05/20/22 0912 05/21/22 0236 05/22/22 0417  WBC 25.7* 25.0* 19.0* 15.7* 21.6*  NEUTROABS  --   --   --  9.5*  --   HGB 13.3 11.7* 10.8* 10.3* 9.2*  HCT 42.7 36.5* 33.1* 31.8* 28.6*  MCV 95.5 93.4 90.9 92.2 93.2  PLT 273 195 191 217 198     Cardiac Enzymes: No results for input(s): "CKTOTAL", "CKMB", "CKMBINDEX", "TROPONINI" in the last 168 hours.  BNP: Invalid input(s): "POCBNP"  CBG: Recent Labs  Lab 05/21/22 1911 05/21/22 2311 05/22/22 0330 05/22/22 0730 05/22/22 1207  GLUCAP 72 82 101* 127* 128*     Microbiology: Results for orders placed or performed during the hospital encounter of 05/12/2022  Urine Culture     Status: Abnormal (Preliminary  result)   Collection Time: 05/11/2022  8:19 PM   Specimen: Urine, Catheterized  Result Value Ref Range Status   Specimen Description   Final    URINE, CATHETERIZED Performed at [facility], [address], [place], [place] [address]    Special Requests   Final    NONE Performed at [facility], [place], [place] [address]    Culture (A)  Final    >=100,000 COLONIES/mL PSEUDOMONAS AERUGINOSA >=100,000 COLONIES/mL ENTEROBACTER AEROGENES 80,000 COLONIES/mL ENTEROCOCCUS FAECALIS SUSCEPTIBILITIES TO FOLLOW Performed at [facility], [place] [address], [place], [place] [address]    Report Status PENDING  Incomplete  Culture, blood (Routine X 2) w Reflex to ID Panel     Status: Abnormal   Collection Time: 05/26/2022 10:41 PM   Specimen: BLOOD RIGHT FOREARM  Result Value Ref Range Status   Specimen Description   Final    BLOOD RIGHT  FOREARM Performed at [facility], [place] [address] [place], [place] [address]    Special Requests   Final    BOTTLES DRAWN AEROBIC AND ANAEROBIC Blood Culture results may not be optimal due to an inadequate volume of blood received in culture bottles Performed at [facility], [address] [place], [place] [address]    Culture  Setup Time   Final    GRAM NEGATIVE RODS IN BOTH AEROBIC AND ANAEROBIC BOTTLES Organism ID to follow CRITICAL RESULT CALLED TO, READ BACK BY AND VERIFIED WITHAubery Lapping PHARMD 1152 05/19/22 HNM Performed at [facility], [address] [place], [place] [address]    Culture ENTEROBACTER AEROGENES (A)  Final   Report Status 05/22/2022 FINAL  Final   Organism ID, Bacteria ENTEROBACTER AEROGENES  Final      Susceptibility   Enterobacter aerogenes - MIC*    CEFEPIME <=0.12 SENSITIVE Sensitive     CEFTAZIDIME <=1 SENSITIVE Sensitive     CEFTRIAXONE <=0.25 SENSITIVE Sensitive     CIPROFLOXACIN <=0.25 SENSITIVE Sensitive     GENTAMICIN <=1 SENSITIVE Sensitive     IMIPENEM 2 SENSITIVE Sensitive     TRIMETH/SULFA <=20 SENSITIVE Sensitive     PIP/TAZO <=4 SENSITIVE Sensitive     * ENTEROBACTER AEROGENES  Blood Culture ID Panel (Reflexed)     Status: Abnormal   Collection Time: 05/17/2022 10:41 PM  Result Value Ref Range Status   Enterococcus faecalis NOT DETECTED NOT DETECTED Final   Enterococcus Faecium NOT DETECTED NOT DETECTED Final   Listeria monocytogenes NOT DETECTED NOT DETECTED Final   Staphylococcus species NOT DETECTED NOT DETECTED Final   Staphylococcus aureus (BCID) NOT DETECTED NOT DETECTED Final   Staphylococcus epidermidis NOT DETECTED NOT DETECTED Final   Staphylococcus lugdunensis NOT DETECTED NOT DETECTED Final   Streptococcus species NOT DETECTED NOT DETECTED Final   Streptococcus agalactiae NOT DETECTED NOT DETECTED Final   Streptococcus pneumoniae NOT DETECTED NOT DETECTED Final   Streptococcus pyogenes NOT  DETECTED NOT DETECTED Final   A.calcoaceticus-baumannii NOT DETECTED NOT DETECTED Final   Bacteroides fragilis NOT DETECTED NOT DETECTED Final   Enterobacterales DETECTED (A) NOT DETECTED Final    Comment: Enterobacterales represent a large order of gram negative bacteria, not a single organism. CRITICAL RESULT CALLED TO, READ BACK BY AND VERIFIED WITH: JUSTIN MILLER [id number] 05/19/22 HNM    Enterobacter cloacae complex NOT DETECTED NOT DETECTED Final   Escherichia coli NOT DETECTED NOT DETECTED Final   Klebsiella aerogenes DETECTED (A) NOT DETECTED Final  Comment: CRITICAL RESULT CALLED TO, READ BACK BY AND VERIFIED WITH: JUSTIN MILLER [id number] 05/19/22 HNM    Klebsiella oxytoca NOT DETECTED NOT DETECTED Final   Klebsiella pneumoniae NOT DETECTED NOT DETECTED Final   Proteus species NOT DETECTED NOT DETECTED Final   Salmonella species NOT DETECTED NOT DETECTED Final   Serratia marcescens NOT DETECTED NOT DETECTED Final   Haemophilus influenzae NOT DETECTED NOT DETECTED Final   Neisseria meningitidis NOT DETECTED NOT DETECTED Final   Pseudomonas aeruginosa NOT DETECTED NOT DETECTED Final   Stenotrophomonas maltophilia NOT DETECTED NOT DETECTED Final   Candida albicans NOT DETECTED NOT DETECTED Final   Candida auris NOT DETECTED NOT DETECTED Final   Candida glabrata NOT DETECTED NOT DETECTED Final   Candida krusei NOT DETECTED NOT DETECTED Final   Candida parapsilosis NOT DETECTED NOT DETECTED Final   Candida tropicalis NOT DETECTED NOT DETECTED Final   Cryptococcus neoformans/gattii NOT DETECTED NOT DETECTED Final   CTX-M ESBL NOT DETECTED NOT DETECTED Final   Carbapenem resistance IMP NOT DETECTED NOT DETECTED Final   Carbapenem resistance KPC NOT DETECTED NOT DETECTED Final   Carbapenem resistance NDM NOT DETECTED NOT DETECTED Final   Carbapenem resist OXA 48 LIKE NOT DETECTED NOT DETECTED Final   Carbapenem resistance VIM NOT DETECTED NOT DETECTED Final    Comment:  Performed at [facility], [address] [place], [place] [address]  Culture, blood (Routine X 2) w Reflex to ID Panel     Status: None (Preliminary result)   Collection Time: 05/19/22  3:54 AM   Specimen: BLOOD  Result Value Ref Range Status   Specimen Description BLOOD LEFT HAND  Final   Special Requests   Final    BOTTLES DRAWN AEROBIC AND ANAEROBIC Blood Culture results may not be optimal due to an inadequate volume of blood received in culture bottles   Culture   Final    NO GROWTH 3 DAYS Performed at [facility], [address] [place], [place] [address]    Report Status PENDING  Incomplete  MRSA Next Gen by PCR, Nasal     Status: None   Collection Time: 05/21/22  3:22 AM   Specimen: Nasal Mucosa; Nasal Swab  Result Value Ref Range Status   MRSA by PCR Next Gen NOT DETECTED NOT DETECTED Final    Comment: (NOTE) The GeneXpert MRSA Assay (FDA approved for NASAL specimens only), is one component of a comprehensive MRSA colonization surveillance program. It is not intended to diagnose MRSA infection nor to guide or monitor treatment for MRSA infections. Test performance is not FDA approved in patients less than 5 years old. Performed at [facility], [address] [place], [place] [address]   Culture, blood (Routine X 2) w Reflex to ID Panel     Status: None (Preliminary result)   Collection Time: 05/21/22  4:00 PM   Specimen: BLOOD  Result Value Ref Range Status   Specimen Description BLOOD BLOOD LEFT HAND  Final   Special Requests   Final    BOTTLES DRAWN AEROBIC AND ANAEROBIC Blood Culture results may not be optimal due to an inadequate volume of blood received in culture bottles   Culture   Final    NO GROWTH < 24 HOURS Performed at [facility], [address], [place], [place] [address]    Report Status PENDING  Incomplete  Culture, blood (Routine X 2) w Reflex to ID Panel     Status: None (Preliminary result)  Collection  Time: 05/21/22  5:49 PM   Specimen: BLOOD  Result Value Ref Range Status   Specimen Description BLOOD BLOOD LEFT HAND  Final   Special Requests   Final    BOTTLES DRAWN AEROBIC AND ANAEROBIC Blood Culture results may not be optimal due to an inadequate volume of blood received in culture bottles   Culture   Final    NO GROWTH < 12 HOURS Performed at [facility], [address], [place], [place] [address]    Report Status PENDING  Incomplete    Coagulation Studies: Recent Labs    05/21/22 1236  LABPROT 23.2*  INR 2.1*     Urinalysis: No results for input(s): "COLORURINE", "LABSPEC", "PHURINE", "GLUCOSEU", "HGBUR", "BILIRUBINUR", "KETONESUR", "PROTEINUR", "UROBILINOGEN", "NITRITE", "LEUKOCYTESUR" in the last 72 hours.  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 Respiratory failure. EXAM: PORTABLE CHEST 1 VIEW COMPARISON:  Portable chest 05/21/2022 and earlier. FINDINGS: Portable AP semi upright view at 0857 hours. Stable lines and tubes. Slightly lower lung volumes. Stable cardiac size and mediastinal contours. Small left pleural effusion. No superimposed pneumothorax, pulmonary edema, right pleural effusion or new pulmonary opacity. Prior sternotomy. Stable visualized osseous structures. IMPRESSION: 1. Stable lines and tubes. 2. Ongoing small left pleural effusion. No new cardiopulmonary abnormality. Electronically Signed   By: Genevie Ann M.D.   On: 05/22/2022 09:04   EEG adult  Result Date: 05/21/2022 Lora Havens, MD     05/21/2022  2:07 PM Patient Name: Mitchell Dixon MRN: [id number] Epilepsy Attending: Lora Havens Referring Physician/Provider: Bradly Bienenstock, NP Date: 05/21/2022 Duration: 23.40 mins Patient history: 66yo M getting EEG to evaluate for seizure. Level of alertness:  comatose AEDs during EEG  study: Propofol Technical aspects: This EEG study was done with scalp electrodes positioned according to the 10-20 International system of electrode placement. Electrical activity was reviewed with band pass filter of 1-70Hz , sensitivity of 7 uV/mm, display speed of 70mm/sec with a 60Hz  notched filter applied as appropriate. EEG data were recorded continuously and digitally stored.  Video monitoring was available and reviewed as appropriate. Description: EEG showed continuous generalized 3 to 6 Hz theta-delta slowing with overriding 12-15hz  beta activity distributed symmetrically and diffusely.  Hyperventilation and photic stimulation were not performed.   ABNORMALITY - Continuous slow, generalized IMPRESSION: This study is suggestive of moderate to severe diffuse encephalopathy, nonspecific etiology but likely related to sedation. No seizures or epileptiform discharges were seen throughout the recording. Lora Havens   DG Chest Port 1 View  Result Date: 05/21/2022 CLINICAL DATA:  History of endotracheal tube. EXAM: PORTABLE CHEST 1 VIEW COMPARISON:  04/20/2022 at 0225 hours. FINDINGS: 0917 hours. Endotracheal tube tip projects over the midthoracic trachea. Enteric tube is coiled within the proximal stomach. Interval placement of a right IJ approach central venous catheter tip projects over the mid SVC. Defibrillator pads overlie the patient. No focal airspace opacity. Normal heart size. Stable mediastinal contours with prominence of the aortic knob. No pleural effusion or pneumothorax. IMPRESSION: 1. Interval placement of a right IJ approach central venous catheter with tip projecting over the mid SVC. Otherwise stable support apparatus. 2.  No evidence of acute cardiopulmonary disease. Electronically Signed   By: Emmit Alexanders M.D.   On: 05/21/2022 09:26   CT CHEST ABDOMEN PELVIS WO CONTRAST  Result Date: 05/21/2022 CLINICAL  DATA:  67 year old male with history of thoracic aortic dissection.  Follow-up study. EXAM: CT CHEST, ABDOMEN AND PELVIS WITHOUT CONTRAST TECHNIQUE: Multidetector CT imaging of the chest, abdomen and pelvis was performed following the standard protocol without IV contrast. RADIATION DOSE REDUCTION: This exam was performed according to the departmental dose-optimization program which includes automated exposure control, adjustment of the mA and/or kV according to patient size and/or use of iterative reconstruction technique. COMPARISON:  CT of the chest, abdomen and pelvis 03/20/2022. FINDINGS: CT CHEST FINDINGS Cardiovascular: Heart size is normal. There is no significant pericardial fluid, thickening or pericardial calcification. There is aortic atherosclerosis, as well as atherosclerosis of the great vessels of the mediastinum and the coronary arteries, including calcified atherosclerotic plaque in the left anterior descending, left circumflex and right coronary arteries. Status post median sternotomy for graft repair of the ascending thoracic aorta. Chronic thoracic aortic aneurysm/dissection again noted, with the mid arch measuring up to 5.2 cm in diameter, and the descending thoracic aorta measuring up to 4.6 cm in diameter, similar to the prior study. Right internal jugular central venous catheter with tip terminating in the distal superior vena cava. Electronic device in the right ventricular apex, presumably an implantable cardiac pacemaker. Mediastinum/Nodes: No high attenuation fluid collection in the mediastinum to suggest mediastinal hematoma. No pathologically enlarged mediastinal or hilar lymph nodes. Nasogastric tube extending into the stomach. Esophagus is otherwise unremarkable in appearance. No axillary lymphadenopathy. Lungs/Pleura: Small left pleural effusion (low-attenuation) lying dependently. No right pleural effusion. Dependent subsegmental atelectasis in the left lower lobe. No consolidative airspace disease. Diffuse bronchial wall thickening with moderate  centrilobular and paraseptal emphysema. No definite suspicious appearing pulmonary nodules or masses are noted. Low lying endotracheal tube with tip in the proximal right mainstem bronchus. Musculoskeletal: Median sternotomy wires. There are no aggressive appearing lytic or blastic lesions noted in the visualized portions of the skeleton. CT ABDOMEN PELVIS FINDINGS Hepatobiliary: No definite suspicious cystic or solid hepatic lesions are confidently identified on today's noncontrast CT examination. Unenhanced appearance of the gallbladder is unremarkable. Pancreas: No definite pancreatic mass or peripancreatic fluid collections or inflammatory changes are noted on today's noncontrast CT examination. Spleen: Unremarkable. Adrenals/Urinary Tract: Severe atrophy of the right kidney. Nonobstructive calculi are noted in the left renal collecting system, largest of which measures up to 1.3 cm in the upper pole. No definite calculi are noted along the course of either ureter. No hydroureteronephrosis. Bilateral adrenal glands are normal in appearance. There are numerous large calculi noted within the lumen of the urinary bladder which is completely decompressed, with Foley balloon catheter in place. Small amount of gas non dependently in the lumen of the urinary bladder, presumably iatrogenic. There appears to be at least 1 calculus within the lumen of the Foley balloon catheter best appreciated on sagittal image 75 of series 6. Additionally, in the penile urethra (sagittal image 79 of series 6) there are small calculi which appear to be adjacent to the Foley catheter. Stomach/Bowel: Tip of nasogastric tube is in the antral pre-pyloric region of the stomach. Stomach is otherwise unremarkable in appearance. No pathologic dilatation of small bowel or colon. Normal appendix. Vascular/Lymphatic: Aortic atherosclerosis with chronic dissection of the abdominal aorta poorly demonstrated on today's noncontrast CT examination.  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 Vascular stents are noted in the common iliac arteries bilaterally. No definite lymphadenopathy noted in the abdomen or pelvis. Reproductive: Prostate gland and seminal vesicles are unremarkable  in appearance. Other: Trace volume of ascites. No pneumoperitoneum. Body wall edema, most evident in the region of the pelvis. Musculoskeletal: There are no aggressive appearing lytic or blastic lesions noted in the visualized portions of the skeleton. IMPRESSION: 1. Grossly unchanged chronic aortic aneurysm/dissection status post graft repair of the ascending thoracic aorta, as detailed above. 2. Low-lying endotracheal tube with tip in the right mainstem bronchus. Retraction of the tube approximately 5 cm for more optimal placement is recommended. 3. Multiple bladder calculi again noted. Bladder is decompressed secondary to indwelling Foley balloon catheter. There appears to be at least 1 calculus in the lumen of the Foley catheter, along with a cluster of several small calculi in the region of the penile urethra adjacent to the catheter, as above. Urologic consultation is recommended. 4. Nonobstructive calculi in the collecting system of the left kidney measuring up to 1.3 cm in the upper pole. No ureteral stones or hydroureteronephrosis. 5. New small left pleural effusion is simple in appearance lying dependently. 6. Severe atrophy of the right kidney again noted. 7. Additional incidental findings, as above. These results will be called to the ordering clinician or representative by the Radiologist Assistant, and communication documented in the PACS or [employer]. Electronically Signed   By: Vinnie Langton M.D.   On: 05/21/2022 08:28   CT HEAD WO CONTRAST (5MM)  Result Date: 05/21/2022 CLINICAL DATA:  67 year old male with encephalopathy. On chronic anticoagulation. EXAM: CT HEAD WITHOUT CONTRAST TECHNIQUE: Contiguous  axial images were obtained from the base of the skull through the vertex without intravenous contrast. RADIATION DOSE REDUCTION: This exam was performed according to the departmental dose-optimization program which includes automated exposure control, adjustment of the mA and/or kV according to patient size and/or use of iterative reconstruction technique. COMPARISON:  Brain MRI and Head CT 07/03/2016. 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 Other bilateral patchy right frontal horn and bilateral external capsule region white matter hypodensity is stable, along with conspicuous bilateral anterior temporal lobe subcortical white matter hypodensity. Underlying cerebral volume remains normal. No midline shift, ventriculomegaly, mass effect, evidence of mass lesion, intracranial hemorrhage or evidence of cortically based acute infarction. Vascular: Mild Calcified atherosclerosis at the skull base. No suspicious intracranial vascular hyperdensity. Skull: No acute osseous abnormality identified. Sinuses/Orbits: Visualized paranasal sinuses and mastoids are stable and well aerated. Other: Visualized orbits and scalp soft tissues are within normal limits. Retained secretions in the posterior nasal cavity and visible nasopharynx. Patient is intubated on the scout view. IMPRESSION: 1. No acute intracranial abnormality. 2. 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 Electronically Signed   By: Genevie Ann M.D.   On: 05/21/2022 05:33   DG Abd 1 View  Result Date: 05/21/2022 CLINICAL DATA:  Nasogastric tube placement EXAM: ABDOMEN - 1 VIEW COMPARISON:  CT 03/20/2022 FINDINGS: Nasogastric tube tip is within the gastric fundus. Markedly dilated  loop of bowel has developed within the mid abdomen, incompletely included as the pelvis is excluded from view. This may represent changes related to sigmoid or cecal volvulus. No free intraperitoneal gas. IMPRESSION: 1. Nasogastric tube tip within the gastric fundus. 2. Markedly dilated loop of bowel within the mid abdomen, incompletely included as the pelvis is excluded from view, raising the question of sigmoid or cecal volvulus. Correlation  with clinical examination and possible CT imaging is recommended. Electronically Signed   By: Fidela Salisbury M.D.   On: 05/21/2022 02:49   DG Chest 1 View  Result Date: 05/21/2022 CLINICAL DATA:  Intubation, NG tube placement EXAM: CHEST  1 VIEW COMPARISON:  05/10/2022 FINDINGS: Endotracheal tube 2.7 cm above the carina. NG tube not visualized. Heart is normal size. Aneurysmal dilatation of the aorta as seen on prior CT. Lungs clear. No effusions or pneumothorax. No acute bony abnormality. IMPRESSION: Endotracheal tube 2.7 cm above the carina.  NG tube not visualized. Aneurysmal dilatation of the thoracic aorta as seen on prior CT. No acute cardiopulmonary disease. Electronically Signed   By: Rolm Baptise M.D.   On: 05/21/2022 02:48     Medications:    albumin human Stopped (05/22/22 1149)   ceFEPime (MAXIPIME) IV Stopped (05/21/22 2152)   feeding supplement (VITAL 1.5 CAL) 40 mL/hr at 05/22/22 1200   heparin 700 Units/hr (05/22/22 1227)   norepinephrine (LEVOPHED) Adult infusion 4 mcg/min (05/22/22 1200)   propofol (DIPRIVAN) infusion 25 mcg/kg/min (05/22/22 1200)   sodium bicarbonate 150 mEq in sterile water 1,150 mL infusion Stopped (05/21/22 1543)    sodium chloride   Intravenous Once   Chlorhexidine Gluconate Cloth  6 each Topical Daily   feeding supplement (PROSource TF20)  60 mL Per Tube BID   free water  30 mL Per Tube Q4H   multivitamin  15 mL Per Tube Daily   nutrition supplement (JUVEN)  1 packet Per Tube BID BM   mouth rinse  15 mL Mouth  Rinse Q2H   pantoprazole (PROTONIX) IV  40 mg Intravenous Daily   acetaminophen **OR** acetaminophen, ipratropium-albuterol, levalbuterol, ondansetron **OR** ondansetron (ZOFRAN) IV, mouth rinse, senna-docusate  Assessment/ Plan:  Mitchell Dixon is a 67 y.o.  male  past medical conditions including atrial fibrillation on Eliquis, pacemaker, quadraplegia with spinal cord ischemia, PAD, and hypertension, who was admitted to [facility] on 05/09/2022 for Urinary retention [R33.9] Atrial fibrillation with RVR ([place]) [I48.91] Severe sepsis ([place]) [A41.9, R65.20] Urinary tract infection with hematuria, site unspecified [N39.0, R31.9] Sepsis, due to unspecified organism, unspecified whether acute organ dysfunction present ([place]) [A41.9]   Acute kidney injury likely secondary to bladder outlet obstruction and possibly ATN due to hypotension and infection. Baseline creatinine 1.22 on  04/27/22. Foley catheter placed by ED staff.  No IV contrast exposure.  - Renal function plateaued. No acute indication for dialysis.  -Urine output dwindling.  Currently at 370 cc.  2.  Hyponatremia: secondary to acute kidney injury.   3.  Hypokalemia: with renal tubular acidosis type IV: improving with bicarb administration and potassium replacement.    4. Bacteremia: enterobacter growing. Empiric vancomycin and cefepime.   5.  Anasarca/generalized edema. -Recommend trial of IV albumin low-dose to mobilize third spaced fluid.    LOS: [place] 3/23/202412:52 PM

## 2022-05-23 DIAGNOSIS — J9601 Acute respiratory failure with hypoxia: Secondary | ICD-10-CM | POA: Diagnosis not present

## 2022-05-23 DIAGNOSIS — I4891 Unspecified atrial fibrillation: Secondary | ICD-10-CM | POA: Diagnosis not present

## 2022-05-23 DIAGNOSIS — N179 Acute kidney failure, unspecified: Secondary | ICD-10-CM | POA: Diagnosis not present

## 2022-05-23 DIAGNOSIS — A419 Sepsis, unspecified organism: Secondary | ICD-10-CM | POA: Diagnosis not present

## 2022-05-23 LAB — BASIC METABOLIC PANEL
Anion gap: 8 (ref 5–15)
Anion gap: 7 (ref 5–15)
BUN: 84 mg/dL — ABNORMAL HIGH (ref 8–23)
BUN: 94 mg/dL — ABNORMAL HIGH (ref 8–23)
CO2: 16 mmol/L — ABNORMAL LOW (ref 22–32)
CO2: 16 mmol/L — ABNORMAL LOW (ref 22–32)
Calcium: 7.5 mg/dL — ABNORMAL LOW (ref 8.9–10.3)
Calcium: 7 mg/dL — ABNORMAL LOW (ref 8.9–10.3)
Chloride: 106 mmol/L (ref 98–111)
Chloride: 104 mmol/L (ref 98–111)
Creatinine, Ser: 4.51 mg/dL — ABNORMAL HIGH (ref 0.61–1.24)
Creatinine, Ser: 4.45 mg/dL — ABNORMAL HIGH (ref 0.61–1.24)
GFR, Estimated: 14 mL/min — ABNORMAL LOW (ref >60)
GFR, Estimated: 14 mL/min — ABNORMAL LOW (ref >60)
Glucose, Bld: 117 mg/dL — ABNORMAL HIGH (ref 70–99)
Glucose, Bld: 117 mg/dL — ABNORMAL HIGH (ref 70–99)
Potassium: 3.6 mmol/L (ref 3.5–5.1)
Potassium: 3.3 mmol/L — ABNORMAL LOW (ref 3.5–5.1)
Sodium: 130 mmol/L — ABNORMAL LOW (ref 135–145)
Sodium: 127 mmol/L — ABNORMAL LOW (ref 135–145)

## 2022-05-23 LAB — C DIFFICILE (CDIFF) QUICK SCRN (NO PCR REFLEX)
C Diff antigen: NEGATIVE
C Diff interpretation: NOT DETECTED
C Diff toxin: NEGATIVE

## 2022-05-23 LAB — BLOOD GAS, VENOUS
Acid-base deficit: 12.3 mmol/L — ABNORMAL HIGH (ref 0.0–2.0)
Bicarbonate: 15.3 mmol/L — ABNORMAL LOW (ref 20.0–28.0)
O2 Saturation: 47.5 %
Patient temperature: 37
pCO2, Ven: 40 mmHg — ABNORMAL LOW (ref 44–60)
pH, Ven: 7.19 — CL (ref 7.25–7.43)
pO2, Ven: 33 mmHg (ref 32–45)

## 2022-05-23 LAB — APTT
aPTT: 107 seconds — ABNORMAL HIGH (ref 24–36)
aPTT: 58 seconds — ABNORMAL HIGH (ref 24–36)

## 2022-05-23 LAB — MAGNESIUM
Magnesium: 2.4 mg/dL (ref 1.7–2.4)
Magnesium: 2.4 mg/dL (ref 1.7–2.4)

## 2022-05-23 LAB — CBC
HCT: 26.3 % — ABNORMAL LOW (ref 39.0–52.0)
Hemoglobin: 8.6 g/dL — ABNORMAL LOW (ref 13.0–17.0)
MCH: 30.8 pg (ref 26.0–34.0)
MCHC: 32.7 g/dL (ref 30.0–36.0)
MCV: 94.3 fL (ref 80.0–100.0)
Platelets: 180 10*3/uL (ref 150–400)
RBC: 2.79 MIL/uL — ABNORMAL LOW (ref 4.22–5.81)
RDW: 16.1 % — ABNORMAL HIGH (ref 11.5–15.5)
WBC: 33.4 10*3/uL — ABNORMAL HIGH (ref 4.0–10.5)
nRBC: 0 % (ref 0.0–0.2)

## 2022-05-23 LAB — GLUCOSE, CAPILLARY
Glucose-Capillary: 101 mg/dL — ABNORMAL HIGH (ref 70–99)
Glucose-Capillary: 105 mg/dL — ABNORMAL HIGH (ref 70–99)
Glucose-Capillary: 105 mg/dL — ABNORMAL HIGH (ref 70–99)
Glucose-Capillary: 130 mg/dL — ABNORMAL HIGH (ref 70–99)
Glucose-Capillary: 133 mg/dL — ABNORMAL HIGH (ref 70–99)
Glucose-Capillary: 98 mg/dL (ref 70–99)

## 2022-05-23 LAB — HEPARIN LEVEL (UNFRACTIONATED): Heparin Unfractionated: >1.1 IU/mL — ABNORMAL HIGH (ref 0.30–0.70)

## 2022-05-23 LAB — PHOSPHORUS
Phosphorus: 1.6 mg/dL — ABNORMAL LOW (ref 2.5–4.6)
Phosphorus: 1.7 mg/dL — ABNORMAL LOW (ref 2.5–4.6)

## 2022-05-23 MED ORDER — POTASSIUM CHLORIDE 20 MEQ PO PACK
40.0000 meq | PACK | ORAL | Status: AC
  Administered 2022-05-23 (×2): 40 meq
  Filled 2022-05-23 (×2): qty 2

## 2022-05-23 MED ORDER — SODIUM BICARBONATE 650 MG PO TABS
650.0000 mg | ORAL_TABLET | Freq: Three times a day (TID) | ORAL | Status: DC
  Administered 2022-05-23: 650 mg via ORAL
  Filled 2022-05-23 (×2): qty 1

## 2022-05-23 MED ORDER — HEPARIN BOLUS VIA INFUSION
1200.0000 [IU] | Freq: Once | INTRAVENOUS | Status: AC
  Administered 2022-05-23: 1200 [IU] via INTRAVENOUS
  Filled 2022-05-23: qty 1200

## 2022-05-23 MED ORDER — K PHOS MONO-SOD PHOS DI & MONO 155-852-130 MG PO TABS
500.0000 mg | ORAL_TABLET | ORAL | Status: AC
  Administered 2022-05-23 (×3): 500 mg
  Filled 2022-05-23 (×3): qty 2

## 2022-05-23 MED ORDER — K PHOS MONO-SOD PHOS DI & MONO 155-852-130 MG PO TABS
500.0000 mg | ORAL_TABLET | ORAL | Status: DC
  Filled 2022-05-23 (×2): qty 2

## 2022-05-23 MED ORDER — SODIUM BICARBONATE 650 MG PO TABS
650.0000 mg | ORAL_TABLET | Freq: Three times a day (TID) | ORAL | Status: DC
  Administered 2022-05-23 (×2): 650 mg
  Filled 2022-05-23 (×3): qty 1

## 2022-05-23 NOTE — Progress Notes (Signed)
[facility]  ROUNDING NOTE   Subjective:   Patient is critically ill.  Intubated and sedated at present. Ventilator 35/5 Tube feeds at 50 cc/h Sedated with propofol Continued on heparin drip Foley has clear urine today. Patient has significant dependent edema     Objective:  Vital signs in last 24 hours:  Temp:  [97.1 F (36.2 C)-98.8 F (37.1 C)] 98.8 F (37.1 C) (03/24 0800) Pulse Rate:  [105-129] 126 (03/24 1100) Resp:  [12-27] 20 (03/24 1100) BP: (79-136)/(49-108) 99/54 (03/24 1100) SpO2:  [97 %-100 %] 100 % (03/24 1100) FiO2 (%):  [35 %] 35 % (03/24 1030) Weight:  [81.8 kg] 81.8 kg (03/24 0500)  Weight change: -0.8 kg Filed Weights   05/21/22 0240 05/22/22 0340 05/23/22 0500  Weight: 81.3 kg 82.6 kg 81.8 kg    Intake/Output: I/O last 3 completed shifts: In: 3795.7 [I.V.:1470.7; Other:40; NG/GT:1992.5; IV Piggyback:292.5] Out: [id number] [Urine:1620]   Intake/Output this shift:  Total I/O In: 739.5 [I.V.:104.6; Other:20; NG/GT:590.8; IV Piggyback:24.1] Out: 150 [Urine:150]  Physical Exam: General: Critically ill  Head: ETT  Eyes: Anicteric  Lungs:  PRVC FiO 35%  Heart: Regular rate and rhythm  Abdomen:  Soft,  Extremities:  ++ Dependent peripheral edema.  Neurologic: Intubated, sedated  Skin: No lesions  Access: None  GU Foley with clear urine.    Basic Metabolic Panel: Recent Labs  Lab 05/19/22 0052 05/19/22 0930 05/20/22 0912 05/21/22 0236 05/21/22 0618 05/21/22 1528 05/22/22 0417 05/23/22 0333  NA  --  129*   < > 127* 129* 125* 125* 130*  K  --  3.2*   < > 2.9* 3.0* 3.5 3.5 3.6  CL  --  102   < > 101 98 98 100 106  CO2  --  13*   < > 15* 16* 17* 16* 16*  GLUCOSE  --  69*   < > 124* 169* 116* 136* 117*  BUN  --  63*   < > 64* 66* 64* 69* 84*  CREATININE  --  4.22*   < > 4.49* 4.37* 4.56* 4.59* 4.51*  CALCIUM  --  7.5*   < > 6.9* 7.3* 7.2* 7.2* 7.5*  MG 1.6* 1.8  --  2.5*  --   --  2.5* 2.4  PHOS  --   --   --   --  2.9  --   2.4* 1.6*   < > = values in this interval not displayed.     Liver Function Tests: Recent Labs  Lab 05/19/2022 1945 05/21/22 0236 05/21/22 0618  AST 64* 35  --   ALT 27 16  --   ALKPHOS 145* 98  --   BILITOT 1.8* 1.0  --   PROT 7.5 5.5*  --   ALBUMIN 2.6* 1.8* 1.9*    No results for input(s): "LIPASE", "AMYLASE" in the last 168 hours. No results for input(s): "AMMONIA" in the last 168 hours.  CBC: Recent Labs  Lab 05/19/22 0930 05/20/22 0912 05/21/22 0236 05/22/22 0417 05/23/22 0333  WBC 25.0* 19.0* 15.7* 21.6* 33.4*  NEUTROABS  --   --  9.5*  --   --   HGB 11.7* 10.8* 10.3* 9.2* 8.6*  HCT 36.5* 33.1* 31.8* 28.6* 26.3*  MCV 93.4 90.9 92.2 93.2 94.3  PLT 195 191 217 198 180     Cardiac Enzymes: No results for input(s): "CKTOTAL", "CKMB", "CKMBINDEX", "TROPONINI" in the last 168 hours.  BNP: Invalid input(s): "POCBNP"  CBG: Recent Labs  Lab  05/22/22 2313 05/22/22 2351 05/23/22 0343 05/23/22 0818 05/23/22 1113  GLUCAP 84 105* 98 130* 133*     Microbiology: Results for orders placed or performed during the hospital encounter of 05/16/2022  Urine Culture     Status: Abnormal (Preliminary result)   Collection Time: 05/13/2022  8:19 PM   Specimen: Urine, Catheterized  Result Value Ref Range Status   Specimen Description   Final    URINE, CATHETERIZED Performed at [facility], [address] [place], [place] [address]    Special Requests   Final    NONE Performed at [facility], [address] [place], [place] [address]    Culture (A)  Final    >=100,000 COLONIES/mL PSEUDOMONAS AERUGINOSA >=100,000 COLONIES/mL ENTEROBACTER AEROGENES 80,000 COLONIES/mL ENTEROCOCCUS FAECALIS REPEATING SUSCEPTIBILITY Performed at [facility], [place] [address] [place], [place] [address]    Report Status PENDING  Incomplete   Organism ID, Bacteria ENTEROBACTER AEROGENES (A)  Final   Organism ID, Bacteria PSEUDOMONAS AERUGINOSA (A)  Final       Susceptibility   Enterobacter aerogenes - MIC*    CEFEPIME <=0.12 SENSITIVE Sensitive     CEFTRIAXONE <=0.25 SENSITIVE Sensitive     CIPROFLOXACIN <=0.25 SENSITIVE Sensitive     GENTAMICIN <=1 SENSITIVE Sensitive     IMIPENEM 1 SENSITIVE Sensitive     NITROFURANTOIN 64 INTERMEDIATE Intermediate     TRIMETH/SULFA <=20 SENSITIVE Sensitive     PIP/TAZO <=4 SENSITIVE Sensitive     * >=100,000 COLONIES/mL ENTEROBACTER AEROGENES   Pseudomonas aeruginosa - MIC*    CEFTAZIDIME <=1 SENSITIVE Sensitive     CIPROFLOXACIN <=0.25 SENSITIVE Sensitive     GENTAMICIN <=1 SENSITIVE Sensitive     IMIPENEM 2 SENSITIVE Sensitive     PIP/TAZO <=4 SENSITIVE Sensitive     CEFEPIME 0.5 SENSITIVE Sensitive     * >=100,000 COLONIES/mL PSEUDOMONAS AERUGINOSA  Culture, blood (Routine X 2) w Reflex to ID Panel     Status: Abnormal   Collection Time: 05/13/2022 10:41 PM   Specimen: BLOOD RIGHT FOREARM  Result Value Ref Range Status   Specimen Description   Final    BLOOD RIGHT FOREARM Performed at [facility], [address] [place], [place] [address]    Special Requests   Final    BOTTLES DRAWN AEROBIC AND ANAEROBIC Blood Culture results may not be optimal due to an inadequate volume of blood received in culture bottles Performed at [facility], [address] [place], [place] [address]    Culture  Setup Time   Final    GRAM NEGATIVE RODS IN BOTH AEROBIC AND ANAEROBIC BOTTLES Organism ID to follow CRITICAL RESULT CALLED TO, READ BACK BY AND VERIFIED WITHAubery Lapping PHARMD Bell[place] 05/19/22 HNM Performed at [facility], [address] [place], [place] [address]    Culture ENTEROBACTER AEROGENES (A)  Final   Report Status 05/22/2022 FINAL  Final   Organism ID, Bacteria ENTEROBACTER AEROGENES  Final      Susceptibility   Enterobacter aerogenes - MIC*    CEFEPIME <=0.12 SENSITIVE Sensitive     CEFTAZIDIME <=1 SENSITIVE Sensitive     CEFTRIAXONE <=0.25 SENSITIVE Sensitive      CIPROFLOXACIN <=0.25 SENSITIVE Sensitive     GENTAMICIN <=1 SENSITIVE Sensitive     IMIPENEM 2 SENSITIVE Sensitive     TRIMETH/SULFA <=20 SENSITIVE Sensitive     PIP/TAZO <=4 SENSITIVE Sensitive     * ENTEROBACTER AEROGENES  Blood Culture ID Panel (  Reflexed)     Status: Abnormal   Collection Time: 05/22/2022 10:41 PM  Result Value Ref Range Status   Enterococcus faecalis NOT DETECTED NOT DETECTED Final   Enterococcus Faecium NOT DETECTED NOT DETECTED Final   Listeria monocytogenes NOT DETECTED NOT DETECTED Final   Staphylococcus species NOT DETECTED NOT DETECTED Final   Staphylococcus aureus (BCID) NOT DETECTED NOT DETECTED Final   Staphylococcus epidermidis NOT DETECTED NOT DETECTED Final   Staphylococcus lugdunensis NOT DETECTED NOT DETECTED Final   Streptococcus species NOT DETECTED NOT DETECTED Final   Streptococcus agalactiae NOT DETECTED NOT DETECTED Final   Streptococcus pneumoniae NOT DETECTED NOT DETECTED Final   Streptococcus pyogenes NOT DETECTED NOT DETECTED Final   A.calcoaceticus-baumannii NOT DETECTED NOT DETECTED Final   Bacteroides fragilis NOT DETECTED NOT DETECTED Final   Enterobacterales DETECTED (A) NOT DETECTED Final    Comment: Enterobacterales represent a large order of gram negative bacteria, not a single organism. CRITICAL RESULT CALLED TO, READ BACK BY AND VERIFIED WITH: JUSTIN MILLER [id number] 05/19/22 HNM    Enterobacter cloacae complex NOT DETECTED NOT DETECTED Final   Escherichia coli NOT DETECTED NOT DETECTED Final   Klebsiella aerogenes DETECTED (A) NOT DETECTED Final    Comment: CRITICAL RESULT CALLED TO, READ BACK BY AND VERIFIED WITH: JUSTIN MILLER [id number] 05/19/22 HNM    Klebsiella oxytoca NOT DETECTED NOT DETECTED Final   Klebsiella pneumoniae NOT DETECTED NOT DETECTED Final   Proteus species NOT DETECTED NOT DETECTED Final   Salmonella species NOT DETECTED NOT DETECTED Final   Serratia marcescens NOT DETECTED NOT DETECTED Final    Haemophilus influenzae NOT DETECTED NOT DETECTED Final   Neisseria meningitidis NOT DETECTED NOT DETECTED Final   Pseudomonas aeruginosa NOT DETECTED NOT DETECTED Final   Stenotrophomonas maltophilia NOT DETECTED NOT DETECTED Final   Candida albicans NOT DETECTED NOT DETECTED Final   Candida auris NOT DETECTED NOT DETECTED Final   Candida glabrata NOT DETECTED NOT DETECTED Final   Candida krusei NOT DETECTED NOT DETECTED Final   Candida parapsilosis NOT DETECTED NOT DETECTED Final   Candida tropicalis NOT DETECTED NOT DETECTED Final   Cryptococcus neoformans/gattii NOT DETECTED NOT DETECTED Final   CTX-M ESBL NOT DETECTED NOT DETECTED Final   Carbapenem resistance IMP NOT DETECTED NOT DETECTED Final   Carbapenem resistance KPC NOT DETECTED NOT DETECTED Final   Carbapenem resistance NDM NOT DETECTED NOT DETECTED Final   Carbapenem resist OXA 48 LIKE NOT DETECTED NOT DETECTED Final   Carbapenem resistance VIM NOT DETECTED NOT DETECTED Final    Comment: Performed at [facility], [address] [place], [place] [address]  Culture, blood (Routine X 2) w Reflex to ID Panel     Status: None (Preliminary result)   Collection Time: 05/19/22  3:54 AM   Specimen: BLOOD  Result Value Ref Range Status   Specimen Description BLOOD LEFT HAND  Final   Special Requests   Final    BOTTLES DRAWN AEROBIC AND ANAEROBIC Blood Culture results may not be optimal due to an inadequate volume of blood received in culture bottles   Culture   Final    NO GROWTH 4 DAYS Performed at [facility], [address] [place], [place] [address]    Report Status PENDING  Incomplete  MRSA Next Gen by PCR, Nasal     Status: None   Collection Time: 05/21/22  3:22 AM   Specimen: Nasal Mucosa; Nasal Swab  Result Value Ref Range Status   MRSA by  PCR Next Gen NOT DETECTED NOT DETECTED Final    Comment: (NOTE) The GeneXpert MRSA Assay (FDA approved for NASAL specimens only), is one component of a  comprehensive MRSA colonization surveillance program. It is not intended to diagnose MRSA infection nor to guide or monitor treatment for MRSA infections. Test performance is not FDA approved in patients less than 58 years old. Performed at [facility], [address] [place], [place] [address]   Culture, blood (Routine X 2) w Reflex to ID Panel     Status: None (Preliminary result)   Collection Time: 05/21/22  4:00 PM   Specimen: BLOOD  Result Value Ref Range Status   Specimen Description BLOOD BLOOD LEFT HAND  Final   Special Requests   Final    BOTTLES DRAWN AEROBIC AND ANAEROBIC Blood Culture results may not be optimal due to an inadequate volume of blood received in culture bottles   Culture   Final    NO GROWTH 2 DAYS Performed at [facility], [address], [place], [place] [address]    Report Status PENDING  Incomplete  Culture, blood (Routine X 2) w Reflex to ID Panel     Status: None (Preliminary result)   Collection Time: 05/21/22  5:49 PM   Specimen: BLOOD  Result Value Ref Range Status   Specimen Description BLOOD BLOOD LEFT HAND  Final   Special Requests   Final    BOTTLES DRAWN AEROBIC AND ANAEROBIC Blood Culture results may not be optimal due to an inadequate volume of blood received in culture bottles   Culture   Final    NO GROWTH 2 DAYS Performed at [facility], [address] [place],  [address]    Report Status PENDING  Incomplete    Coagulation Studies: Recent Labs    05/21/22 1236  LABPROT 23.2*  INR 2.1*     Urinalysis: No results for input(s): "COLORURINE", "LABSPEC", "PHURINE", "GLUCOSEU", "HGBUR", "BILIRUBINUR", "KETONESUR", "PROTEINUR", "UROBILINOGEN", "NITRITE", "LEUKOCYTESUR" in the last 72 hours.  Invalid input(s): "APPERANCEUR"     Imaging: US Venous Img Lower Bilateral (DVT)  Result Date: 05/22/2022 CLINICAL DATA:  Bilateral swelling EXAM: BILATERAL LOWER EXTREMITY VENOUS DOPPLER ULTRASOUND  TECHNIQUE: Gray-scale sonography with compression, as well as color and duplex ultrasound, were performed to evaluate the deep venous system(s) from the level of the common femoral vein through the popliteal and proximal calf veins. COMPARISON:  None Available. FINDINGS: VENOUS Normal compressibility of the common femoral, superficial femoral, and popliteal veins, as well as the visualized calf veins. Visualized portions of profunda femoral vein and great saphenous vein unremarkable. No filling defects to suggest DVT on grayscale or color Doppler imaging. Doppler waveforms show normal direction of venous flow, normal respiratory plasticity and response to augmentation. OTHER None. Limitations: none IMPRESSION: Negative. Electronically Signed   By: Lucrezia Europe M.D.   On: 05/22/2022 14:17   DG Chest Port 1 View  Result Date: 05/22/2022 CLINICAL DATA:  67 year old male with aortic dissection status post graft repair of ascending aorta. Respiratory failure. EXAM: PORTABLE CHEST 1 VIEW COMPARISON:  Portable chest 05/21/2022 and earlier. FINDINGS: Portable AP semi upright view at 0857 hours. Stable lines and tubes. Slightly lower lung volumes. Stable cardiac size and mediastinal contours. Small left pleural effusion. No superimposed pneumothorax, pulmonary edema, right pleural effusion or new pulmonary opacity. Prior sternotomy. Stable visualized osseous structures. IMPRESSION: 1. Stable lines and tubes. 2. Ongoing small left pleural effusion. No new cardiopulmonary abnormality. Electronically Signed  By: Genevie Ann M.D.   On: 05/22/2022 09:04   EEG adult  Result Date: 05/21/2022 Lora Havens, MD     05/21/2022  2:07 PM Patient Name: [place] Dixon MRN: [id number] Epilepsy Attending: Lora Havens Referring Physician/Provider: Bradly Bienenstock, NP Date: 05/21/2022 Duration: 23.40 mins Patient history: 67yo M getting EEG to evaluate for seizure. Level of alertness:  comatose AEDs during EEG study:  Propofol Technical aspects: This EEG study was done with scalp electrodes positioned according to the 10-20 International system of electrode placement. Electrical activity was reviewed with band pass filter of 1-70Hz , sensitivity of 7 uV/mm, display speed of 69mm/sec with a 60Hz  notched filter applied as appropriate. EEG data were recorded continuously and digitally stored.  Video monitoring was available and reviewed as appropriate. Description: EEG showed continuous generalized 3 to 6 Hz theta-delta slowing with overriding 12-15hz  beta activity distributed symmetrically and diffusely.  Hyperventilation and photic stimulation were not performed.   ABNORMALITY - Continuous slow, generalized IMPRESSION: This study is suggestive of moderate to severe diffuse encephalopathy, nonspecific etiology but likely related to sedation. No seizures or epileptiform discharges were seen throughout the recording. Priyanka Barbra Sarks     Medications:    albumin human Stopped (05/23/22 1058)   ceFEPime (MAXIPIME) IV Stopped (05/22/22 2112)   feeding supplement (VITAL 1.5 CAL) 50 mL/hr at 05/23/22 1101   heparin 600 Units/hr (05/23/22 1101)   norepinephrine (LEVOPHED) Adult infusion Stopped (05/23/22 0337)   propofol (DIPRIVAN) infusion 35 mcg/kg/min (05/23/22 1105)    sodium chloride   Intravenous Once   Chlorhexidine Gluconate Cloth  6 each Topical Daily   feeding supplement (PROSource TF20)  60 mL Per Tube BID   free water  30 mL Per Tube Q4H   multivitamin  15 mL Per Tube Daily   nutrition supplement (JUVEN)  1 packet Per Tube BID BM   mouth rinse  15 mL Mouth Rinse Q2H   pantoprazole (PROTONIX) IV  40 mg Intravenous Daily   phosphorus  500 mg Per Tube Q4H   sodium bicarbonate  650 mg Oral TID   acetaminophen **OR** acetaminophen, ipratropium-albuterol, levalbuterol, ondansetron **OR** ondansetron (ZOFRAN) IV, mouth rinse, senna-docusate  Assessment/ Plan:  [name] is a 67 y.o.  male   past medical conditions including atrial fibrillation on Eliquis, pacemaker, quadraplegia with spinal cord ischemia, PAD, and hypertension, who was admitted to [facility] on 05/24/2022 for Urinary retention [R33.9] Atrial fibrillation with RVR ([place]) [I48.91] Severe sepsis ([place]) [A41.9, R65.20] Urinary tract infection with hematuria, site unspecified [N39.0, R31.9] Sepsis, due to unspecified organism, unspecified whether acute organ dysfunction present ([place]) [A41.9]   Acute kidney injury likely secondary to bladder outlet obstruction and possibly ATN due to hypotension and infection. Baseline creatinine 1.22 on  04/27/22. Foley catheter placed by ED staff.  No IV contrast exposure.  - Renal function plateaued.  Urine output slightly increased to 1250 cc. -Continue IV albumin for. -Patient has mixed respiratory and metabolic acidosis.  Iv Bicarbonate supplementation can be considered.  Currently on oral supplementation. -If no improvement in clinical condition, may need short-term hemodialysis.   2.  Hyponatremia: secondary to acute kidney injury.   3.  Anasarca/generalized edema. -Fluids has started to mobilize with low-dose IV albumin    4. Bacteremia: enterobacter growing. Empiric vancomycin and cefepime.       LOS: 5 [place] Dixon 3/24/202411:19 AM

## 2022-05-23 NOTE — Plan of Care (Signed)
DSiscussed in front of patient shift report, plan of care and medications with no evidence of learning at this time.  Patient's sister updated by MD this morning shift per report.  Problem: Education: Goal: Knowledge of General Education information will improve Description: Including pain rating scale, medication(s)/side effects and non-pharmacologic comfort measures Outcome: Not Progressing

## 2022-05-23 NOTE — Consult Note (Signed)
[place] for heparin Indication: atrial fibrillation  No Known Allergies  Patient Measurements: Height: 5\' 10"  (177.8 cm) Weight: 82.6 kg (182 lb 1.6 oz) IBW/kg (Calculated) : 73 Heparin Dosing Weight: 81.3 kg  Vital Signs: Temp: 98.2 F (36.8 C) (03/24 0315) Temp Source: Oral (03/24 0315) BP: 117/66 (03/24 0345) Pulse Rate: 116 (03/24 0330)  Labs: Recent Labs    05/21/22 0236 05/21/22 0618 05/21/22 1236 05/21/22 1528 05/21/22 1945 05/22/22 0417 05/22/22 0845 05/22/22 1939 05/23/22 0333  HGB 10.3*  --   --   --   --  9.2*  --   --  8.6*  HCT 31.8*  --   --   --   --  28.6*  --   --  26.3*  PLT 217  --   --   --   --  198  --   --  180  APTT  --   --  44*  --    < >  --  >200* 98* 107*  LABPROT  --   --  23.2*  --   --   --   --   --   --   INR  --   --  2.1*  --   --   --   --   --   --   HEPARINUNFRC  --   --  >1.10*  --   --   --  >1.10*  --  >1.10*  CREATININE 4.49* 4.37*  --  4.56*  --  4.59*  --   --  4.51*  TROPONINIHS 44* 49*  --   --   --   --   --   --   --    < > = values in this interval not displayed.     Estimated Creatinine Clearance: 17.1 mL/min (A) (by C-G formula based on SCr of 4.51 mg/dL (H)).   Medical History: Past Medical History:  Diagnosis Date   Ascending aortic dissection ([place])    a. 01/2017 s/p emergent repair @ [facility].   Cardiac pacemaker in situ    a. 03/2017 s/p MDT [place] (ser# [id number]).   Chronic HFrEF (heart failure with reduced ejection fraction) ([place])    a. 06/2016 Echo: EF 45%, diff HK, GrI DD; b. 03/2017 Echo: EF 55-60%, apical wma, Gr2 DD; c. 06/2017 Echo: EF 55-60%, apical, apical inf, and mid antsept wma; d. 07/2018 Echo: EF 45%, apical thinning & AK. Dil Asc Ao.   Essential hypertension    LV (left ventricular) mural thrombus    a. 06/2016 Echo: EF 45%, diff HK w/ thrombus (0.9 x 0.9 x.0.7cm), GrI DD, mild AI/MR.   Mixed hyperlipidemia    PAD (peripheral artery disease)  ([facility])    PAF (paroxysmal atrial fibrillation) ([facility])    a. CHA2DS2VASc = 5-->chronic eliquis.   Paraplegia ([place])    a.  Spinal cord ischemia and injury in the setting of aortic dissection in December 2018.   Stroke [facility]) 06/2016   Urinary incontinence    a. in setting of paraplegia - self catheterizes.    Medications:  PTA: Apixaban 5 mg BID (last dose on 3/21 at 2133) Inpatient: Heparin infusion 3/22 > Allergies: NKDA   Assessment: 67 yo M with PMH aortic dissection, paraplegia 2/2 spinal cord ischemia, PAD, HTN, Afib on Eliquis, pacemaker, neurogenic bladder w/ indwelling Foley presents with abdominal distention and anuria x 1 day. Was receiving apixaban but currently  held due to gross hematuria that is being managed via gentle Foley catheter irrigation per urology recs. Pharmacy asked to initiate heparin for Afib (CHADSVASc 5) which will enable [place] to quickly stop [facility] if hematuria worsens.  Date Time aPTT/HL Rate/Comment 3/22 1236 44/>1.10 Baseline labs 03/22 1945 >200 / --- Supratherapeutic / Hold x1 hr then 1200 > 900 u/hr 3/23 0850 > 200/> 1.1 Supratherapeutic / Hold x1 hr then 900 > 700 u/hr 3/23 1939 98 / ---  Therapeutic x1 / 700 u/hr 3/24 0333 107 / > 1.1 Supratherapeutic  Goal of Therapy:  Heparin level 0.3-0.7 units/ml once correlation with heparin level and aPTT aPTT 66-102 seconds Monitor platelets by anticoagulation protocol: Yes    Plan:  Decrease heparin infusion to 600 units/hr Recheck aPTT in 8 hr after rate change.  Titrate by aPTT's until lab correlation is noted, then titrate by anti-xa alone. Continue to monitor H&H and platelets daily while on heparin gtt.  Renda Rolls, PharmD, [facility] 05/23/2022 4:42 AM

## 2022-05-23 NOTE — Consult Note (Signed)
[place] for heparin Indication: atrial fibrillation  No Known Allergies  Patient Measurements: Height: 5\' 10"  (177.8 cm) Weight: 81.8 kg (180 lb 5.4 oz) (84.8-3) IBW/kg (Calculated) : 73 Heparin Dosing Weight: 81.3 kg  Vital Signs: Temp: 98.4 F (36.9 C) (03/24 1200) Temp Source: Oral (03/24 1200) BP: 107/62 (03/24 1445) Pulse Rate: 121 (03/24 1445)  Labs: Recent Labs    05/21/22 0236 05/21/22 0618 05/21/22 1236 05/21/22 1528 05/21/22 1945 05/22/22 0417 05/22/22 0845 05/22/22 1939 05/23/22 0333 05/23/22 1438  HGB 10.3*  --   --   --   --  9.2*  --   --  8.6*  --   HCT 31.8*  --   --   --   --  28.6*  --   --  26.3*  --   PLT 217  --   --   --   --  198  --   --  180  --   APTT  --   --  44*  --    < >  --  >200* 98* 107* 58*  LABPROT  --   --  23.2*  --   --   --   --   --   --   --   INR  --   --  2.1*  --   --   --   --   --   --   --   HEPARINUNFRC  --   --  >1.10*  --   --   --  >1.10*  --  >1.10*  --   CREATININE 4.49* 4.37*  --  4.56*  --  4.59*  --   --  4.51*  --   TROPONINIHS 44* 49*  --   --   --   --   --   --   --   --    < > = values in this interval not displayed.     Estimated Creatinine Clearance: 17.1 mL/min (A) (by C-G formula based on SCr of 4.51 mg/dL (H)).   Medical History: Past Medical History:  Diagnosis Date   Ascending aortic dissection ([place])    a. 01/2017 s/p emergent repair @ [facility].   Cardiac pacemaker in situ    a. 03/2017 s/p MDT [place] (ser# [id number]).   Chronic HFrEF (heart failure with reduced ejection fraction) ([place])    a. 06/2016 Echo: EF 45%, diff HK, GrI DD; b. 03/2017 Echo: EF 55-60%, apical wma, Gr2 DD; c. 06/2017 Echo: EF 55-60%, apical, apical inf, and mid antsept wma; d. 07/2018 Echo: EF 45%, apical thinning & AK. Dil Asc Ao.   Essential hypertension    LV (left ventricular) mural thrombus    a. 06/2016 Echo: EF 45%, diff HK w/ thrombus (0.9 x 0.9 x.0.7cm), GrI DD,  mild AI/MR.   Mixed hyperlipidemia    PAD (peripheral artery disease) (HCC)    PAF (paroxysmal atrial fibrillation) (HCC)    a. CHA2DS2VASc = 5-->chronic eliquis.   Paraplegia ([place])    a.  Spinal cord ischemia and injury in the setting of aortic dissection in December 2018.   Stroke [facility]) 06/2016   Urinary incontinence    a. in setting of paraplegia - self catheterizes.    Medications:  PTA: Apixaban 5 mg BID (last dose on 3/21 at 2133) Inpatient: Heparin infusion 3/22 > Allergies: NKDA   Assessment: 67 yo M with PMH aortic dissection, paraplegia 2/2  spinal cord ischemia, PAD, HTN, Afib on Eliquis, pacemaker, neurogenic bladder w/ indwelling Foley presents with abdominal distention and anuria x 1 day. Was receiving apixaban but currently held due to gross hematuria that is being managed via gentle Foley catheter irrigation per urology recs. Pharmacy asked to initiate heparin for Afib (CHADSVASc 5) which will enable [place] to quickly stop [facility] if hematuria worsens.  Date Time aPTT/HL Rate/Comment 3/22 1236 44/>1.10 Baseline labs 3/22 1945 >200 / --- Supratherapeutic / Hold x1 hr then 1200 > 900 u/hr 3/23 0850 >200/> 1.1 Supratherapeutic / Hold x1 hr then 900 > 700 u/hr 3/23 1939 98 / ---  Therapeutic x1 / 700 u/hr 3/24 0333 107 / > 1.1 Supratherapeutic 3/24 1438 58 / ---  Subtherapeutic  Goal of Therapy:  Heparin level 0.3-0.7 units/ml once correlation with heparin level and aPTT aPTT 66-102 seconds Monitor platelets by anticoagulation protocol: Yes    Plan:  Bolus heparin 1200 units x 1 and increase heparin infusion to 750 units/hr Recheck aPTT and HL in 8 hr after rate change.  Titrate by aPTT's until lab correlation is noted, then titrate by anti-xa alone. Continue to monitor H&H and platelets daily while on heparin gtt.  Will M. Ouida Sills, PharmD PGY-1 Pharmacy Resident 05/23/2022 3:09 PM

## 2022-05-23 NOTE — Progress Notes (Signed)
Spoke with Dr. Genia Harold and made MD aware that patient has had 4 type 7 bowel movements in last 10-12 hours and needs flexi seal. MD gave order for flexi seal.

## 2022-05-23 NOTE — Consult Note (Signed)
PHARMACY CONSULT NOTE - FOLLOW UP  Pharmacy Consult for Electrolyte Monitoring and Replacement   Recent Labs: Potassium (mmol/L)  Date Value  05/23/2022 3.6   Magnesium (mg/dL)  Date Value  05/23/2022 2.4   Calcium (mg/dL)  Date Value  05/23/2022 7.5 (L)   Albumin (g/dL)  Date Value  05/21/2022 1.9 (L)  10/19/2016 4.2   Phosphorus (mg/dL)  Date Value  05/23/2022 1.6 (L)   Sodium (mmol/L)  Date Value  05/23/2022 130 (L)  02/17/2017 141    Assessment: 67 yo M with PMH aortic dissection, h/o paraplegia 2/2 spinal cord ischemia, HTN, Afib on Eliquis, pacemaker presents with abdominal distention and anuria x 1 day. Patient found to be in Afib w/ RVR. Imaging notable for markedly dilated bowel loop and c/f sigmoid or cecal volvulus. Patient in AKI but UOP improving. Suspect renal function secondary to obstructive uropathy and acute tubular necrosis.  Pt is currently on a ventilator. Scr not improved.   Pt is on NaBicarb  75 ml/hr.  Pt is receiving nutrition supplement via tube BID. Tube feeds 50 ml/hr. Feeding supplement 60 ml BID,  Free water 30 ml q4H,   Goal of Therapy:  K >/= 4.0 and Mg >/= 2.0  Plan:  Hesitant to give IV Kphos due to Scr. Will give neurtral tab Kphos every 4 hours x 3 doses.  Defer Na to nephro team.  F/u with AM labs.   Oswald Hillock ,PharmD Clinical Pharmacist 05/23/2022 9:54 AM

## 2022-05-23 NOTE — Consult Note (Signed)
PHARMACY CONSULT NOTE - FOLLOW UP  Pharmacy Consult for Electrolyte Monitoring and Replacement   Recent Labs: Potassium (mmol/L)  Date Value  05/23/2022 3.3 (L)   Magnesium (mg/dL)  Date Value  05/23/2022 2.4   Calcium (mg/dL)  Date Value  05/23/2022 7.0 (L)   Albumin (g/dL)  Date Value  05/21/2022 1.9 (L)  10/19/2016 4.2   Phosphorus (mg/dL)  Date Value  05/23/2022 1.7 (L)   Sodium (mmol/L)  Date Value  05/23/2022 127 (L)  02/17/2017 141    Assessment: 67 yo M with PMH aortic dissection, h/o paraplegia 2/2 spinal cord ischemia, HTN, Afib on Eliquis, pacemaker presents with abdominal distention and anuria x 1 day. Patient found to be in Afib w/ RVR. Imaging notable for markedly dilated bowel loop and c/f sigmoid or cecal volvulus. Patient in AKI but UOP improving. Suspect renal function secondary to obstructive uropathy and acute tubular necrosis.  Pt is currently on a ventilator. Scr not improved.   Pt is on NaBicarb  75 ml/hr.  Pt is receiving nutrition supplement via tube BID. Tube feeds 50 ml/hr. Feeding supplement 60 ml BID,  Free water 30 ml q4H,   Goal of Therapy:  K >/= 4.0 and Mg >/= 2.0  Plan:  K+ 3.3. Will give Kcl Per Tube 9meq every 4 hours x 2 doses.   Defer Na to nephro team.  F/u with AM labs.    Wynelle Cleveland ,PharmD, BCPS Clinical Pharmacist 05/23/2022 6:14 PM

## 2022-05-23 NOTE — Progress Notes (Deleted)
  Electrophysiology Office Note:    Date:  05/23/2022   ID:  Mitchell Dixon, DOB [birth year], MRN [id number]  [facility] Cardiologist:  Kathlyn Sacramento, MD  [facility] Electrophysiologist:  Vickie Epley, MD   Referring MD: Donnie Coffin, MD   Chief Complaint: Pacemaker care  History of Present Illness:         Their past medical, social and family history was reveiwed.   ROS:   Please see the history of present illness.    All other systems reviewed and are negative.  EKGs/Labs/Other Studies Reviewed:    The following studies were reviewed today:  ***  EKG:  The ekg ordered today demonstrates ***   Physical Exam:    VS:  There were no vitals taken for this visit.    Wt Readings from Last 3 Encounters:  05/23/22 180 lb 5.4 oz (81.8 kg)  04/27/22 171 lb (77.6 kg)  03/20/22 200 lb (90.7 kg)     GEN: *** Well nourished, well developed in no acute distress CARDIAC: ***RRR, no murmurs, rubs, gallops RESPIRATORY:  Clear to auscultation without rales, wheezing or rhonchi       ASSESSMENT AND PLAN:    No diagnosis found.  ***         Total time spent with patient today *** minutes. This includes reviewing records, evaluating the patient and coordinating care.     Signed, Mitchell Cork. Quentin Ore, MD, [facility], [facility] 05/23/2022 4:12 PM    Electrophysiology [facility]

## 2022-05-23 NOTE — Progress Notes (Signed)
NAME:  Mitchell Dixon, MRN:  [id number], DOB:  1956-01-23, LOS: 5 ADMISSION DATE:  05/03/2022, CHIEF COMPLAINT:  respiratory failure   History of Present Illness:   67 year old male with a history of type 1 aortic dissection status post emergent repair December 2018, paraplegic secondary to spinal cord ischemia and injury at the time of aortic dissection, hypertension, hyperlipidemia, Chronic HFrEF (heart failure with reduced ejection fraction), paroxysmal atrial fibrillation on chronic apixaban, permanent pacemaker, and LV thrombus (May 2018), chronic urinary retention, who presented to the ED with chief complaints of abdominal distention and urinary retention x 1 day.   Per ED reports, patient was found to be in A-fib with RVR with heart rate in the [id number] and systolic blood pressure in the 80s.  Patient was complaining of shortness of breath and dizziness.   ED Course: Initial vital signs showed HR of 179 beats/minute, BP 117/105 mm Hg, the RR 39 breaths/minute, and the oxygen saturation 95% on RA and a temperature of 97.51F (36.9C).    Pertinent Labs/Diagnostics Findings: Na+/ K+: 130/3.0 glucose: 129 BUN/Cr.:62/5.11 WBC: 25.7 Lactic acid: 8.1 Troponin: 52 BNP: 266 UA positive for UTI Imaging: See below Medications Administered: Cefepime, diltiazem, sodium chloride 2 L bolus, sodium chloride 500 mL bolus, diltiazem GGT.    Hospital Course: Patient was admitted to PCU under hospitalist service for management of sepsis secondary to suspected pneumonia and UTI, A-fib with RVR and AKI with multiple metabolic derangement. SEE SIGNIFICANT EVENTS BELOW  Pertinent  Medical History  Ascending aortic dissection ([place]) 01/2017 s/p emergent repair @ [facility]. Cardiac pacemaker in situ              03/2017 s/p MDT [place] (ser# [id number] S). Chronic HFrEF (heart failure with reduced ejection fraction) ([place])         06/2016 Echo: EF 45%, diff HK, GrI DD; b. 03/2017 Echo: EF 55-60%, apical  wma, Gr2 DD; c. 06/2017 Echo: EF 55-60%, apical, apical inf, and mid antsept wma; d. 07/2018 Echo: EF 45%, apical thinning & AK. Dil Asc Ao. Essential hypertension        LV (left ventricular) mural thrombus           06/2016 Echo: EF 45%, diff HK w/ thrombus (0.9 x 0.9 x.0.7cm), GrI DD, mild AI/MR. Mixed hyperlipidemia          PAD (peripheral artery disease) (HCC)      PAF (paroxysmal atrial fibrillation) (HCC)              CHA2DS2VASc = 5-->chronic eliquis. Paraplegia ([place])    Spinal cord ischemia and injury in the setting of aortic dissection in December 2018. Stroke [facility])        06/2016 Urinary incontinence           in setting of paraplegia - self catheterizes.  Significant Hospital Events: Including procedures, antibiotic start and stop dates in addition to other pertinent events   3/19: Admitted to PCU with sepsis secondary to obstructive uropathy, AKI with multiple electrolyte derangements and A-fib with RVR. 3/20: Nephrology consulted recs no indication for dialysis continue to with IV fluids 3/21: Urology consulted for worsening gross hematuria recommends manual irrigation of the Foley holding Eliquis and trending CBC. ID consulted for Klebsiella aeruginosa bacteremia  3/22: Rapid response called for acute respiratory distress and complaint of lower back pain.  Patient transferred to the ICU and emergently intubated for airway protection.  Interim History / Subjective:  Patient awakens with lightened sedation, follows  commands  Objective   Blood pressure (!) 105/58, pulse (!) 124, temperature 98.2 F (36.8 C), temperature source Oral, resp. rate (!) 22, height 5\' 10"  (1.778 m), weight 81.8 kg, SpO2 100 %.    Vent Mode: PRVC FiO2 (%):  [35 %] 35 % Set Rate:  [20 bmp] 20 bmp Vt Set:  [500 mL] 500 mL PEEP:  [5 cmH20] 5 cmH20 Plateau Pressure:  [13 cmH20-14 cmH20] 14 cmH20   Intake/Output Summary (Last 24 hours) at 05/23/2022 0801 Last data filed at 05/23/2022 0730 Gross per 24  hour  Intake 2644.65 ml  Output 1245 ml  Net 1399.65 ml    Filed Weights   05/21/22 0240 05/22/22 0340 05/23/22 0500  Weight: 81.3 kg 82.6 kg 81.8 kg    Examination: Physical Exam Vitals reviewed.  Constitutional:      General: He is not in acute distress.    Appearance: He is ill-appearing.  HENT:     Head: Normocephalic.     Mouth/Throat:     Comments: ETT in place Eyes:     Pupils: Pupils are equal, round, and reactive to light.  Cardiovascular:     Rate and Rhythm: Normal rate and regular rhythm.     Pulses: Normal pulses.     Heart sounds: Normal heart sounds.  Pulmonary:     Breath sounds: Normal breath sounds.     Comments: Ventilated breath sounds Abdominal:     Palpations: Abdomen is soft.  Musculoskeletal:     Right lower leg: Edema present.     Left lower leg: Edema present.     Comments: anasarca  Neurological:     Comments: Sedated, follows commands. Able to move upper extremities    Assessment & Plan:   66 year old male with a history of multiple medical problems including paraplegia secondary to spinal cord ischemia, HFrEF, afib, and LV thrombus who presented to the hospital with urinary retention and developed sepsis due to gram negative bacteremia. Patient's mental status worsened with hypotension and a disconjugate gaze and was subsequently intubated for airway protection.   Neurology #Toxic Metabolic Encephalopathy   Sudden in onset, with associated shaking movements concerning for seizure disorder. Head CT also suggestive of possible CADASIL. We are unable to obtain an MRI due to incompatibility with his leadless pacemaker. Patient initiated on propofol for sedation for vent compliance. spot EEG (held sedation) with slowing consistent with encephalopathy. He does follow commands with sedation holidays, acid-base status and metabolic disturbances are barriers to extubation.  -goal RASS -1 -daily sedation holiday   Cardiovascular #Distributive  Shock #Ischemic cardiomyopathy #History of LV thrombus #Afib #History of Aortic dissection repair  Initially hypotensive in the setting of known bacteremia and sepsis, this however appeared to be improved with antibiotics. Currently hypotension is sedation related, and improves with the discontinuation of propofol. Most recent TTE from January of this year with recovered ejection fraction. Patient with history of Afib and will be on anti-coagulation (switched from eliquis to heparin gtt). Repeat imaging of the chest shows the size of his aorta to be similar, less concerning for a ruptured aneurysm/worsening dissection. Poor kidney function precludes [place] from being able to administer contrast. He is on low dose nor-epinephrine with good response (not required when off prop).  -goal MAP > 65 mmHg -heparin gtt for afib and history of LV thrombus   Pulmonary #Acute Hypoxic Respiratory Failure  Secondary to encephalopathy, possible seizure thou EEG only with slowing. Intubated for airway protection, currently on  minimal ventilator settings. Chest CT with atelectasis, but no infiltrates or consolidations. Notable metabolic acidosis currently a barrier to extubation, will continue to closely monitor blood gases   -Full vent support, implement lung protective strategies -Plateau pressures less than 30 cm H20 -Wean FiO2 & PEEP as tolerated to maintain O2 sats >92% -Spontaneous Breathing Trials when respiratory parameters met and mental status permits -Implement VAP Bundle -Prn Bronchodilators -VBG today   Gastrointestinal   initiated tube feeds, SUP   Renal #AKI #Metabolic acidosis #Urinary Retention   Renal consult appreciated. Patient with metabolic acidosis, no lactic acid or Beta-hydroxybutarate elevation. This is likely secondary to AKI and uremia. Bicarbonate infusion discontinued due to minimal urine output, overall positive fluid balance, and anasarca.   Initial presentation was  secondary to urinary tract infection secondary to retention. Foley catheter in place and urology following. PRN bladder scans in place, and foley catheter was flushed secondary to finding of calculi in the catheter. Patient is a poor candidate for dialysis. Trialed albumin yesterday after discussion with our nephrology colleagues. Overall fluid balance remains significantly up. I have discussed this with the patient's sister and explained to her the poor prognosis.   -repeat VBG today -closely monitor electrolytes, replete as indicated   Endocrine ICU glycemic protocol.   Hem/Onc On AC for history of LV thrombus and Afib. switched to heparin gtt given hematuria and for tighter control of AC. Hemoglobin has been slowly trending downwards.  -continue heparin gtt -goal H/H > 7   ID #Sepsis #Gram Negative Bacteremia #UTI   On broad spectrum antibiotics, blood cultures were positive and repeats are no growth to date. Continue with Cefepime. White blood cell count continues to rise, and patient had 4 episodes of watery stools, and has not received laxatives, will send a C. Diff testing. -check C. Diff -continue broad spectrum antibiotics  Best Practice (right click and "Reselect all SmartList Selections" daily)   Diet/type: tubefeeds DVT prophylaxis: systemic heparin GI prophylaxis: PPI Lines: Central line Foley:  Yes, and it is still needed Code Status:  full code Last date of multidisciplinary goals of care discussion [05/23/2022]  Labs   CBC: Recent Labs  Lab 05/19/22 0930 05/20/22 0912 05/21/22 0236 05/22/22 0417 05/23/22 0333  WBC 25.0* 19.0* 15.7* 21.6* 33.4*  NEUTROABS  --   --  9.5*  --   --   HGB 11.7* 10.8* 10.3* 9.2* 8.6*  HCT 36.5* 33.1* 31.8* 28.6* 26.3*  MCV 93.4 90.9 92.2 93.2 94.3  PLT 195 191 217 198 180     Basic Metabolic Panel: Recent Labs  Lab 05/19/22 0052 05/19/22 0930 05/20/22 0912 05/21/22 0236 05/21/22 0618 05/21/22 1528 05/22/22 0417  05/23/22 0333  NA  --  129*   < > 127* 129* 125* 125* 130*  K  --  3.2*   < > 2.9* 3.0* 3.5 3.5 3.6  CL  --  102   < > 101 98 98 100 106  CO2  --  13*   < > 15* 16* 17* 16* 16*  GLUCOSE  --  69*   < > 124* 169* 116* 136* 117*  BUN  --  63*   < > 64* 66* 64* 69* 84*  CREATININE  --  4.22*   < > 4.49* 4.37* 4.56* 4.59* 4.51*  CALCIUM  --  7.5*   < > 6.9* 7.3* 7.2* 7.2* 7.5*  MG 1.6* 1.8  --  2.5*  --   --  2.5* 2.4  PHOS  --   --   --   --  2.9  --  2.4* 1.6*   < > = values in this interval not displayed.    GFR: Estimated Creatinine Clearance: 17.1 mL/min (A) (by C-G formula based on SCr of 4.51 mg/dL (H)). Recent Labs  Lab 05/19/22 1409 05/20/22 0912 05/21/22 0236 05/21/22 0618 05/22/22 0417 05/23/22 0333  WBC  --  19.0* 15.7*  --  21.6* 33.4*  LATICACIDVEN 2.3* 1.4 2.9* 1.4  --   --      Liver Function Tests: Recent Labs  Lab 05/10/2022 1945 05/21/22 0236 05/21/22 0618  AST 64* 35  --   ALT 27 16  --   ALKPHOS 145* 98  --   BILITOT 1.8* 1.0  --   PROT 7.5 5.5*  --   ALBUMIN 2.6* 1.8* 1.9*    No results for input(s): "LIPASE", "AMYLASE" in the last 168 hours. No results for input(s): "AMMONIA" in the last 168 hours.  ABG    Component Value Date/Time   PHART 7.34 (L) 05/22/2022 0853   PCO2ART 27 (L) 05/22/2022 0853   PO2ART 134 (H) 05/22/2022 0853   HCO3 14.6 (L) 05/22/2022 0853   ACIDBASEDEF 9.6 (H) 05/22/2022 0853   O2SAT 100 05/22/2022 0853     Coagulation Profile: Recent Labs  Lab 05/19/22 0328 05/21/22 1236  INR 2.7* 2.1*     Cardiac Enzymes: No results for input(s): "CKTOTAL", "CKMB", "CKMBINDEX", "TROPONINI" in the last 168 hours.  HbA1C: Hgb A1c MFr Bld  Date/Time Value Ref Range Status  07/20/2016 06:38 PM 5.7 (H) 4.8 - 5.6 % Final    Comment:             Pre-diabetes: 5.7 - 6.4          Diabetes: >6.4          Glycemic control for adults with diabetes: <7.0     CBG: Recent Labs  Lab 05/22/22 1522 05/22/22 1946 05/22/22 2313  05/22/22 2351 05/23/22 0343  GLUCAP 102* 95 84 105* 98      Past Medical History:  He,  has a past medical history of Ascending aortic dissection ([place]), Cardiac pacemaker in situ, Chronic HFrEF (heart failure with reduced ejection fraction) ([place]), Essential hypertension, LV (left ventricular) mural thrombus, Mixed hyperlipidemia, PAD (peripheral artery disease) ([place]), PAF (paroxysmal atrial fibrillation) ([place]), Paraplegia ([place]), Stroke ([place]) (06/2016), and Urinary incontinence.   Surgical History:   Past Surgical History:  Procedure Laterality Date   DIALYSIS/PERMA CATHETER INSERTION N/A 02/25/2017   Procedure: DIALYSIS/PERMA CATHETER INSERTION;  Surgeon: Algernon Huxley, MD;  Location: [facility];  Service: Cardiovascular;  Laterality: N/A;   EMBOLECTOMY Right 02/23/2017   Procedure: EMBOLECTOMY;  Surgeon: Katha Cabal, MD;  Location: [facility] ORS;  Service: Vascular;  Laterality: Right;   FASCIECTOMY Right 02/23/2017   Procedure: FASCIECTOMY;  Surgeon: Katha Cabal, MD;  Location: [facility] ORS;  Service: Vascular;  Laterality: Right;   HERNIA REPAIR       Social History:   reports that he has been smoking cigarettes. He has a 7.50 pack-year smoking history. He has never used smokeless tobacco. He reports that he does not drink alcohol and does not use drugs.   Family History:  His family history includes Dementia in his mother; Heart failure in his mother and sister; Hypertension in his mother and sister; Pneumonia in his father.   Allergies No Known Allergies   Home Medications  Prior to Admission medications   Medication Sig Start Date End Date Taking? Authorizing Provider  atorvastatin (LIPITOR) 40 MG tablet Take 40 mg by mouth every evening.   Yes [provider]  ELIQUIS 5 MG TABS tablet Take 5 mg by mouth 2 (two) times daily.   Yes [provider]  metoprolol tartrate (LOPRESSOR) 50 MG tablet Take 50 mg by mouth 2 (two) times daily.   Yes  [provider]  budesonide (PULMICORT) 0.25 MG/2ML nebulizer solution Take 2 mLs (0.25 mg total) by nebulization every 6 (six) hours. Patient not taking: Reported on 03/21/2022 02/26/17   Tukov-Yual, Arlyss Gandy, NP  ipratropium-albuterol (DUONEB) 0.5-2.5 (3) MG/3ML SOLN Take 3 mLs by nebulization every 6 (six) hours. Patient not taking: Reported on 03/21/2022 02/26/17   Erlene Quan, NP     Critical care time: 5 minutes    Armando Reichert, MD [facility] 05/23/2022 5:19 PM

## 2022-05-24 DIAGNOSIS — N390 Urinary tract infection, site not specified: Secondary | ICD-10-CM | POA: Diagnosis not present

## 2022-05-24 DIAGNOSIS — R6520 Severe sepsis without septic shock: Secondary | ICD-10-CM | POA: Diagnosis not present

## 2022-05-24 DIAGNOSIS — A419 Sepsis, unspecified organism: Secondary | ICD-10-CM | POA: Diagnosis not present

## 2022-05-24 DIAGNOSIS — G928 Other toxic encephalopathy: Secondary | ICD-10-CM | POA: Diagnosis not present

## 2022-05-24 DIAGNOSIS — R7881 Bacteremia: Secondary | ICD-10-CM

## 2022-05-24 DIAGNOSIS — J9601 Acute respiratory failure with hypoxia: Secondary | ICD-10-CM | POA: Diagnosis not present

## 2022-05-24 DIAGNOSIS — Z66 Do not resuscitate: Secondary | ICD-10-CM

## 2022-05-24 DIAGNOSIS — Z515 Encounter for palliative care: Secondary | ICD-10-CM

## 2022-05-24 DIAGNOSIS — R339 Retention of urine, unspecified: Secondary | ICD-10-CM | POA: Diagnosis not present

## 2022-05-24 LAB — CBC
HCT: 25.2 % — ABNORMAL LOW (ref 39.0–52.0)
Hemoglobin: 8 g/dL — ABNORMAL LOW (ref 13.0–17.0)
MCH: 30.4 pg (ref 26.0–34.0)
MCHC: 31.7 g/dL (ref 30.0–36.0)
MCV: 95.8 fL (ref 80.0–100.0)
Platelets: 208 10*3/uL (ref 150–400)
RBC: 2.63 MIL/uL — ABNORMAL LOW (ref 4.22–5.81)
RDW: 16.7 % — ABNORMAL HIGH (ref 11.5–15.5)
WBC: 49.7 10*3/uL — ABNORMAL HIGH (ref 4.0–10.5)
nRBC: 0 % (ref 0.0–0.2)

## 2022-05-24 LAB — URINE CULTURE: Culture: >=100000 — AB

## 2022-05-24 LAB — BASIC METABOLIC PANEL
Anion gap: 7 (ref 5–15)
BUN: 94 mg/dL — ABNORMAL HIGH (ref 8–23)
CO2: 16 mmol/L — ABNORMAL LOW (ref 22–32)
Calcium: 7 mg/dL — ABNORMAL LOW (ref 8.9–10.3)
Chloride: 106 mmol/L (ref 98–111)
Creatinine, Ser: 4.59 mg/dL — ABNORMAL HIGH (ref 0.61–1.24)
GFR, Estimated: 14 mL/min — ABNORMAL LOW (ref >60)
Glucose, Bld: 125 mg/dL — ABNORMAL HIGH (ref 70–99)
Potassium: 4.2 mmol/L (ref 3.5–5.1)
Sodium: 129 mmol/L — ABNORMAL LOW (ref 135–145)

## 2022-05-24 LAB — PREPARE RBC (CROSSMATCH)

## 2022-05-24 LAB — TYPE AND SCREEN
ABO/RH(D): A POS
Antibody Screen: NEGATIVE
Unit division: 0
Unit division: 0

## 2022-05-24 LAB — CULTURE, BLOOD (ROUTINE X 2): Culture: NO GROWTH

## 2022-05-24 LAB — GLUCOSE, CAPILLARY
Glucose-Capillary: 100 mg/dL — ABNORMAL HIGH (ref 70–99)
Glucose-Capillary: 104 mg/dL — ABNORMAL HIGH (ref 70–99)
Glucose-Capillary: 106 mg/dL — ABNORMAL HIGH (ref 70–99)
Glucose-Capillary: 113 mg/dL — ABNORMAL HIGH (ref 70–99)
Glucose-Capillary: 136 mg/dL — ABNORMAL HIGH (ref 70–99)
Glucose-Capillary: 68 mg/dL — ABNORMAL LOW (ref 70–99)
Glucose-Capillary: 82 mg/dL (ref 70–99)

## 2022-05-24 LAB — BPAM RBC
Blood Product Expiration Date: 202404162359
Blood Product Expiration Date: 202404182359
Unit Type and Rh: 6200
Unit Type and Rh: 6200

## 2022-05-24 LAB — PHOSPHORUS: Phosphorus: 2.1 mg/dL — ABNORMAL LOW (ref 2.5–4.6)

## 2022-05-24 LAB — APTT
aPTT: 82 seconds — ABNORMAL HIGH (ref 24–36)
aPTT: 82 seconds — ABNORMAL HIGH (ref 24–36)

## 2022-05-24 LAB — HEPARIN LEVEL (UNFRACTIONATED): Heparin Unfractionated: >1.1 IU/mL — ABNORMAL HIGH (ref 0.30–0.70)

## 2022-05-24 MED ORDER — FUROSEMIDE 10 MG/ML IJ SOLN
60.0000 mg | Freq: Once | INTRAMUSCULAR | Status: AC
  Administered 2022-05-24: 60 mg via INTRAVENOUS
  Filled 2022-05-24: qty 6

## 2022-05-24 MED ORDER — MIDODRINE HCL 5 MG PO TABS
10.0000 mg | ORAL_TABLET | Freq: Three times a day (TID) | ORAL | Status: DC
  Administered 2022-05-24 – 2022-05-25 (×3): 10 mg via ORAL
  Filled 2022-05-24 (×3): qty 2

## 2022-05-24 MED ORDER — DEXTROSE 50 % IV SOLN: 12.5000 g | INTRAVENOUS | Status: AC

## 2022-05-24 MED ORDER — FENTANYL CITRATE PF 50 MCG/ML IJ SOSY
PREFILLED_SYRINGE | INTRAMUSCULAR | Status: AC
  Filled 2022-05-24: qty 2

## 2022-05-24 MED ORDER — SODIUM CHLORIDE 0.9 % IV SOLN
500.0000 mg | Freq: Two times a day (BID) | INTRAVENOUS | Status: DC
  Administered 2022-05-24 – 2022-05-25 (×2): 500 mg via INTRAVENOUS
  Filled 2022-05-24 (×2): qty 10

## 2022-05-24 MED ORDER — POTASSIUM & SODIUM PHOSPHATES 280-160-250 MG PO PACK
2.0000 | PACK | ORAL | Status: AC
  Administered 2022-05-24 (×2): 2
  Filled 2022-05-24 (×2): qty 2

## 2022-05-24 MED ORDER — ROCURONIUM BROMIDE 10 MG/ML (PF) SYRINGE
PREFILLED_SYRINGE | INTRAVENOUS | Status: AC
  Filled 2022-05-24: qty 10

## 2022-05-24 MED ORDER — SODIUM CHLORIDE 0.9 % IV SOLN
2.0000 g | INTRAVENOUS | Status: DC
  Filled 2022-05-24: qty 12.5

## 2022-05-24 MED ORDER — ETOMIDATE 2 MG/ML IV SOLN
INTRAVENOUS | Status: AC
  Filled 2022-05-24: qty 20

## 2022-05-24 MED ORDER — FUROSEMIDE 10 MG/ML IJ SOLN: 120.0000 mg | Freq: Once | INTRAVENOUS | Status: DC

## 2022-05-24 NOTE — Consult Note (Signed)
Consultation Note Date: 05/24/2022   Patient Name: Mitchell Dixon  DOB: 1956-02-15  MRN: [id number]  Age / Sex: 68 y.o., male  PCP: Donnie Coffin, MD Referring Physician: Flora Lipps, MD  Reason for Consultation: Establishing goals of care and Psychosocial/spiritual support  HPI/Patient Profile: 66 y.o. 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  ED report; patient was found to be in A-fib with RVR with heart rate in the [id number] and systolic blood pressure in the 80s.  Patient was complaining of shortness of breath and dizziness.   3/19: Admitted to PCU with sepsis secondary to obstructive uropathy, AKI with multiple electrolyte derangements and A-fib with RVR. 3/20: Nephrology consulted recs no indication for dialysis continue to with IV fluids 3/21: Urology consulted for worsening gross hematuria recommends manual irrigation of the Foley holding Eliquis and trending CBC. ID consulted for Klebsiella aeruginosa bacteremia  3/22: Rapid response called for acute respiratory distress and complaint of lower back pain.  Patient transferred to the ICU and emergently intubated for airway protection. 3/25 remains on vent-- Dr Mortimer Fries updated family on medical situation   Family face treatment option decisions, advanced directive decisions and anticipatory care needs.     Clinical Assessment and Goals of Care:  This NP Wadie Lessen reviewed medical records, received report from team, assessed the patient and then meet at the patient's bedside with large family to include sister/ Mitchell Dixon, brothers; Mitchell Dixon and niece/Mitchell Dixon  to discuss diagnosis, prognosis, GOC, EOL wishes disposition and options. Values and goals of care important to patient and family were attempted to be elicited.   Concept of Palliative Care was introduced as specialized medical care for people and their families living with serious illness.  If focuses on providing relief from the symptoms and stress of a serious illness.  The goal is to improve quality of life for both the patient and the family.  Created space and opportunity for family to explore thoughts and feelings regarding current medical situation. All family in agreement the patient has had dramatic loss of quality of life over the past 5 years.  They all have a deep understanding of knowing the patient would not want continued aggressive medical interventions in light of associated poor prognosis.  All family agree that comfort and dignity are priority and care plan.    A  discussion was had today regarding advanced directives.  Concepts specific to code status, artifical feeding and hydration, continued IV antibiotics and rehospitalization was had.     The difference between a aggressive medical intervention path  and a palliative comfort care  path for this patient at this time was had.    Family is a unit has made decision for no escalation of care.  DNR/DNI placed.  Family will catheter tomorrow mornin for a one-way extubation to a full comfort path.    Natural trajectory and expectations at EOL were discussed.    Questions and concerns addressed.  Family  encouraged to call with questions or concerns.     PMT will continue to support holistically.             NEXT OF KIN    SUMMARY OF RECOMMENDATIONS    Family wish to continue current medical  interventions with no escalation of care over the next 24 hours as family gathered to visit.  Plan is for one-way extubation tomorrow morning.  Family will notify palliative medicine of a time that works for family to be present for that extubation      Late entry- family will be at bedside at 11:00 am   Code Status/Advance Care Planning: DNR-documented today after discussion with CCM and family No escalation of care    Palliative Prophylaxis:  Frequent Pain Assessment and Oral Care   Psycho-social/Spiritual:  Desire for further Chaplaincy support:no-declined Additional Recommendations: Education on Hospice  Prognosis:  Hours - Days-after one way extubation  Discharge Planning: To Be Determined      Primary Diagnoses: Present on Admission:  Severe sepsis ([place])  Atrophic kidney  Atrial fibrillation with RVR (HCC)  AKI (acute kidney injury) ([place])  Chronic complete paraplegia (HCC)  Hypertension  Hyperlipidemia  Neurogenic bladder  Hyponatremia  Hypokalemia  Metabolic acidosis   I have reviewed the medical record, interviewed the patient and family, and examined the patient. The following aspects are pertinent.  Past Medical History:  Diagnosis Date   Ascending aortic dissection ([place])    a. 01/2017 s/p emergent repair @ [facility].   Cardiac pacemaker in situ    a. 03/2017 s/p MDT [facility] (ser# [id number]).   Chronic HFrEF (heart failure with reduced ejection fraction) ([place])    a. 06/2016 Echo: EF 45%, diff HK, GrI DD; b. 03/2017 Echo: EF 55-60%, apical wma, Gr2 DD; c. 06/2017 Echo: EF 55-60%, apical, apical inf, and mid antsept wma; d. 07/2018 Echo: EF 45%, apical thinning & AK. Dil Asc Ao.   Essential hypertension    LV (left ventricular) mural thrombus    a. 06/2016 Echo: EF 45%, diff HK w/ thrombus (0.9 x 0.9 x.0.7cm), GrI DD, mild AI/MR.   Mixed hyperlipidemia    PAD (peripheral artery disease) (HCC)    PAF (paroxysmal atrial fibrillation) (HCC)    a.  CHA2DS2VASc = 5-->chronic eliquis.   Paraplegia ([place])    a.  Spinal cord ischemia and injury in the setting of aortic dissection in December 2018.   Stroke [facility]) 06/2016   Urinary incontinence    a. in setting of paraplegia - self catheterizes.   Social History   Socioeconomic History   Marital status: Single    Spouse name: Not on file   Number of children: Not on file   Years of education: Not on file   Highest education level: Not on file  Occupational History   Not on file  Tobacco Use   Smoking status: Every Day    Packs/day: 0.25    Years: 30.00    Additional pack years: 0.00    Total pack years: 7.50    Types: Cigarettes   Smokeless tobacco: Never  Substance and Sexual  Activity   Alcohol use: No   Drug use: No   Sexual activity: Not Currently  Other Topics Concern   Not on file  Social History Narrative   Lives locally.  Mostly bedridden in setting of hypotension and presyncope/syncope when sitting upright.   Social Determinants of Health   Financial Resource Strain: Not on file  Food Insecurity: No Food Insecurity (05/19/2022)   Hunger Vital Sign    Worried About Running Out of Food in the Last Year: Never true    Ran Out of Food in the Last Year: Never true  Transportation Needs: No Transportation Needs (05/19/2022)   PRAPARE - Hydrologist (Medical): No    Lack of Transportation (Non-Medical): No  Physical Activity: Not on file  Stress: Not on file  Social Connections: Not on file   Family History  Problem Relation Age of Onset   Heart failure Mother    Dementia Mother    Hypertension Mother    Pneumonia Father    Heart failure Sister    Hypertension Sister    Scheduled Meds:  sodium chloride   Intravenous Once   Chlorhexidine Gluconate Cloth  6 each Topical Daily   feeding supplement (PROSource TF20)  60 mL Per Tube BID   free water  30 mL Per Tube Q4H   multivitamin  15 mL Per Tube Daily   nutrition supplement  (JUVEN)  1 packet Per Tube BID BM   mouth rinse  15 mL Mouth Rinse Q2H   pantoprazole (PROTONIX) IV  40 mg Intravenous Daily   potassium & sodium phosphates  2 packet Per Tube Q4H   sodium bicarbonate  650 mg Per Tube TID   Continuous Infusions:  albumin human Stopped (05/23/22 2254)   ceFEPime (MAXIPIME) IV Stopped (05/23/22 2222)   feeding supplement (VITAL 1.5 CAL) 50 mL/hr at 05/24/22 0658   heparin 750 Units/hr (05/24/22 0658)   norepinephrine (LEVOPHED) Adult infusion 3 mcg/min (05/24/22 0658)   propofol (DIPRIVAN) infusion 40 mcg/kg/min (05/24/22 0821)   PRN Meds:.ipratropium-albuterol, levalbuterol, mouth rinse Medications Prior to Admission:  Prior to Admission medications   Medication Sig Start Date End Date Taking? Authorizing Provider  atorvastatin (LIPITOR) 40 MG tablet Take 40 mg by mouth every evening.   Yes [provider]  ELIQUIS 5 MG TABS tablet Take 5 mg by mouth 2 (two) times daily.   Yes [provider]  metoprolol tartrate (LOPRESSOR) 50 MG tablet Take 50 mg by mouth 2 (two) times daily.   Yes [provider]  budesonide (PULMICORT) 0.25 MG/2ML nebulizer solution Take 2 mLs (0.25 mg total) by nebulization every 6 (six) hours. Patient not taking: Reported on 03/21/2022 02/26/17   Tukov-Yual, Arlyss Gandy, NP  ipratropium-albuterol (DUONEB) 0.5-2.5 (3) MG/3ML SOLN Take 3 mLs by nebulization every 6 (six) hours. Patient not taking: Reported on 03/21/2022 02/26/17   Erlene Quan, NP   No Known Allergies Review of Systems  Unable to perform ROS: Intubated    Physical Exam Constitutional:      Appearance: He is cachectic. He is ill-appearing.     Interventions: He is intubated.  Cardiovascular:     Rate and Rhythm: Tachycardia present.  Pulmonary:     Effort: He is intubated.  Skin:    General: Skin is warm and dry.     Vital Signs: BP (!) 89/63   Pulse (!) 120   Temp 98.6 F (37 C) (Axillary)   Resp 19  Ht 5\' 10"   (1.778 m)   Wt 82.5 kg Comment: 85.8-3  SpO2 100%   BMI 26.10 kg/m  Pain Scale: CPOT   Pain Score: Asleep (pt resting with eyes closed)   SpO2: SpO2: 100 % O2 Device:SpO2: 100 % O2 Flow Rate: .O2 Flow Rate (L/min): 2 L/min  IO: Intake/output summary:  Intake/Output Summary (Last 24 hours) at 05/24/2022 [id number] Last data filed at 05/24/2022 0800 Gross per 24 hour  Intake 2889.08 ml  Output 630 ml  Net 2259.08 ml    LBM: Last BM Date : 05/23/22 Baseline Weight: Weight: 77.6 kg Most recent weight: Weight: 82.5 kg (85.8-3)     Palliative Assessment/Data:   90 minutes  Time spent includes detailed review of medical records/labs, vital signs, medically appropriate exam, discussed with treatment team, counseling and coordination of care with patient and family and staff, documentation  Signed by: Wadie Lessen, NP   Please contact Palliative Medicine Team phone at [phone] for questions and concerns.  For individual provider: See Shea Evans

## 2022-05-24 NOTE — Progress Notes (Signed)
[facility]  ROUNDING NOTE   Subjective:   Patient is critically ill.  Intubated and sedated at present. Vent support with 35% FiO2 Sedation with propofol Heparin drip infusing Tube feeds at 50 mL/h with a 30 mL flush  Creatinine 4.59 Urine output 2.47 L in preceding 24 hours.   Objective:  Vital signs in last 24 hours:  Temp:  [98.2 F (36.8 C)-98.6 F (37 C)] 98.6 F (37 C) (03/25 0756) Pulse Rate:  [109-131] 116 (03/25 0930) Resp:  [15-29] 21 (03/25 0930) BP: (59-135)/(42-94) 106/60 (03/25 0930) SpO2:  [100 %] 100 % (03/25 0930) FiO2 (%):  [3 %-35 %] 3 % (03/25 0756) Weight:  [82.5 kg] 82.5 kg (03/25 0500)  Weight change: 0.7 kg Filed Weights   05/22/22 0340 05/23/22 0500 05/24/22 0500  Weight: 82.6 kg 81.8 kg 82.5 kg    Intake/Output: I/O last 3 completed shifts: In: 4308.4 [I.V.:1130.4; Other:40; NG/GT:2830; IV Piggyback:308] Out: 1150 [Urine:1150]   Intake/Output this shift:  Total I/O In: 153.8 [I.V.:53.8; NG/GT:100] Out: 30 [Urine:30]  Physical Exam: General: Critically ill  Head: ETT  Eyes: Anicteric  Lungs:  PRVC FiO 35%  Heart: Regular rate and rhythm  Abdomen:  Soft,  Extremities:  ++ Dependent peripheral edema.  Neurologic: Intubated, sedated  Skin: No lesions  Access: None  GU Foley with clear urine.    Basic Metabolic Panel: Recent Labs  Lab 05/19/22 0930 05/20/22 0912 05/21/22 0236 05/21/22 0618 05/21/22 1528 05/22/22 0417 05/23/22 0333 05/23/22 1639 05/24/22 0013  NA 129*   < > 127* 129* 125* 125* 130* 127* 129*  K 3.2*   < > 2.9* 3.0* 3.5 3.5 3.6 3.3* 4.2  CL 102   < > 101 98 98 100 106 104 106  CO2 13*   < > 15* 16* 17* 16* 16* 16* 16*  GLUCOSE 69*   < > 124* 169* 116* 136* 117* 117* 125*  BUN 63*   < > 64* 66* 64* 69* 84* 94* 94*  CREATININE 4.22*   < > 4.49* 4.37* 4.56* 4.59* 4.51* 4.45* 4.59*  CALCIUM 7.5*   < > 6.9* 7.3* 7.2* 7.2* 7.5* 7.0* 7.0*  MG 1.8  --  2.5*  --   --  2.5* 2.4 2.4  --   PHOS  --    --   --  2.9  --  2.4* 1.6* 1.7* 2.1*   < > = values in this interval not displayed.     Liver Function Tests: Recent Labs  Lab 05/21/2022 1945 05/21/22 0236 05/21/22 0618  AST 64* 35  --   ALT 27 16  --   ALKPHOS 145* 98  --   BILITOT 1.8* 1.0  --   PROT 7.5 5.5*  --   ALBUMIN 2.6* 1.8* 1.9*    No results for input(s): "LIPASE", "AMYLASE" in the last 168 hours. No results for input(s): "AMMONIA" in the last 168 hours.  CBC: Recent Labs  Lab 05/20/22 0912 05/21/22 0236 05/22/22 0417 05/23/22 0333 05/24/22 0734  WBC 19.0* 15.7* 21.6* 33.4* 49.7*  NEUTROABS  --  9.5*  --   --   --   HGB 10.8* 10.3* 9.2* 8.6* 8.0*  HCT 33.1* 31.8* 28.6* 26.3* 25.2*  MCV 90.9 92.2 93.2 94.3 95.8  PLT 191 217 198 180 208     Cardiac Enzymes: No results for input(s): "CKTOTAL", "CKMB", "CKMBINDEX", "TROPONINI" in the last 168 hours.  BNP: Invalid input(s): "POCBNP"  CBG: Recent Labs  Lab 05/23/22  1632 05/23/22 1915 05/24/22 0019 05/24/22 0312 05/24/22 0726  GLUCAP 105* 101* 113* 106* 104*     Microbiology: Results for orders placed or performed during the hospital encounter of 05/21/2022  Urine Culture     Status: Abnormal   Collection Time: 05/14/2022  8:19 PM   Specimen: Urine, Catheterized  Result Value Ref Range Status   Specimen Description   Final    URINE, CATHETERIZED Performed at [facility], [address] [place], [place] [address]    Special Requests   Final    NONE Performed at [facility], [address] [place], [place] [address]    Culture (A)  Final    >=100,000 COLONIES/mL PSEUDOMONAS AERUGINOSA >=100,000 COLONIES/mL ENTEROBACTER AEROGENES 80,000 COLONIES/mL ENTEROCOCCUS FAECALIS    Report Status 05/24/2022 FINAL  Final   Organism ID, Bacteria ENTEROBACTER AEROGENES (A)  Final   Organism ID, Bacteria PSEUDOMONAS AERUGINOSA (A)  Final   Organism ID, Bacteria ENTEROCOCCUS FAECALIS (A)  Final      Susceptibility   Enterobacter  aerogenes - MIC*    CEFEPIME <=0.12 SENSITIVE Sensitive     CEFTRIAXONE <=0.25 SENSITIVE Sensitive     CIPROFLOXACIN <=0.25 SENSITIVE Sensitive     GENTAMICIN <=1 SENSITIVE Sensitive     IMIPENEM 1 SENSITIVE Sensitive     NITROFURANTOIN 64 INTERMEDIATE Intermediate     TRIMETH/SULFA <=20 SENSITIVE Sensitive     PIP/TAZO <=4 SENSITIVE Sensitive     * >=100,000 COLONIES/mL ENTEROBACTER AEROGENES   Enterococcus faecalis - MIC*    AMPICILLIN <=2 SENSITIVE Sensitive     NITROFURANTOIN <=16 SENSITIVE Sensitive     VANCOMYCIN <=0.5 SENSITIVE Sensitive     * 80,000 COLONIES/mL ENTEROCOCCUS FAECALIS   Pseudomonas aeruginosa - MIC*    CEFTAZIDIME <=1 SENSITIVE Sensitive     CIPROFLOXACIN <=0.25 SENSITIVE Sensitive     GENTAMICIN <=1 SENSITIVE Sensitive     IMIPENEM 2 SENSITIVE Sensitive     PIP/TAZO <=4 SENSITIVE Sensitive     CEFEPIME 0.5 SENSITIVE Sensitive     * >=100,000 COLONIES/mL PSEUDOMONAS AERUGINOSA  Culture, blood (Routine X 2) w Reflex to ID Panel     Status: Abnormal   Collection Time: 05/14/2022 10:41 PM   Specimen: BLOOD RIGHT FOREARM  Result Value Ref Range Status   Specimen Description   Final    BLOOD RIGHT FOREARM Performed at [facility], [address] [place], [place] [address]    Special Requests   Final    BOTTLES DRAWN AEROBIC AND ANAEROBIC Blood Culture results may not be optimal due to an inadequate volume of blood received in culture bottles Performed at [facility], [address] [place], [place] [address]    Culture  Setup Time   Final    GRAM NEGATIVE RODS IN BOTH AEROBIC AND ANAEROBIC BOTTLES Organism ID to follow CRITICAL RESULT CALLED TO, READ BACK BY AND VERIFIED WITHAubery Lapping PHARMD 1152 05/19/22 HNM Performed at [facility], [address] [place],  [address]    Culture ENTEROBACTER AEROGENES (A)  Final   Report Status 05/22/2022 FINAL  Final   Organism ID, Bacteria ENTEROBACTER AEROGENES  Final       Susceptibility   Enterobacter aerogenes - MIC*    CEFEPIME <=0.12 SENSITIVE Sensitive     CEFTAZIDIME <=1 SENSITIVE Sensitive     CEFTRIAXONE <=0.25 SENSITIVE Sensitive     CIPROFLOXACIN <=0.25 SENSITIVE Sensitive     GENTAMICIN <=1 SENSITIVE Sensitive     IMIPENEM  2 SENSITIVE Sensitive     TRIMETH/SULFA <=20 SENSITIVE Sensitive     PIP/TAZO <=4 SENSITIVE Sensitive     * ENTEROBACTER AEROGENES  Blood Culture ID Panel (Reflexed)     Status: Abnormal   Collection Time: 05/26/2022 10:41 PM  Result Value Ref Range Status   Enterococcus faecalis NOT DETECTED NOT DETECTED Final   Enterococcus Faecium NOT DETECTED NOT DETECTED Final   Listeria monocytogenes NOT DETECTED NOT DETECTED Final   Staphylococcus species NOT DETECTED NOT DETECTED Final   Staphylococcus aureus (BCID) NOT DETECTED NOT DETECTED Final   Staphylococcus epidermidis NOT DETECTED NOT DETECTED Final   Staphylococcus lugdunensis NOT DETECTED NOT DETECTED Final   Streptococcus species NOT DETECTED NOT DETECTED Final   Streptococcus agalactiae NOT DETECTED NOT DETECTED Final   Streptococcus pneumoniae NOT DETECTED NOT DETECTED Final   Streptococcus pyogenes NOT DETECTED NOT DETECTED Final   A.calcoaceticus-baumannii NOT DETECTED NOT DETECTED Final   Bacteroides fragilis NOT DETECTED NOT DETECTED Final   Enterobacterales DETECTED (A) NOT DETECTED Final    Comment: Enterobacterales represent a large order of gram negative bacteria, not a single organism. CRITICAL RESULT CALLED TO, READ BACK BY AND VERIFIED WITH: JUSTIN MILLER [id number] 05/19/22 HNM    Enterobacter cloacae complex NOT DETECTED NOT DETECTED Final   Escherichia coli NOT DETECTED NOT DETECTED Final   Klebsiella aerogenes DETECTED (A) NOT DETECTED Final    Comment: CRITICAL RESULT CALLED TO, READ BACK BY AND VERIFIED WITH: JUSTIN MILLER [id number] 05/19/22 HNM    Klebsiella oxytoca NOT DETECTED NOT DETECTED Final   Klebsiella pneumoniae NOT DETECTED NOT DETECTED  Final   Proteus species NOT DETECTED NOT DETECTED Final   Salmonella species NOT DETECTED NOT DETECTED Final   Serratia marcescens NOT DETECTED NOT DETECTED Final   Haemophilus influenzae NOT DETECTED NOT DETECTED Final   Neisseria meningitidis NOT DETECTED NOT DETECTED Final   Pseudomonas aeruginosa NOT DETECTED NOT DETECTED Final   Stenotrophomonas maltophilia NOT DETECTED NOT DETECTED Final   Candida albicans NOT DETECTED NOT DETECTED Final   Candida auris NOT DETECTED NOT DETECTED Final   Candida glabrata NOT DETECTED NOT DETECTED Final   Candida krusei NOT DETECTED NOT DETECTED Final   Candida parapsilosis NOT DETECTED NOT DETECTED Final   Candida tropicalis NOT DETECTED NOT DETECTED Final   Cryptococcus neoformans/gattii NOT DETECTED NOT DETECTED Final   CTX-M ESBL NOT DETECTED NOT DETECTED Final   Carbapenem resistance IMP NOT DETECTED NOT DETECTED Final   Carbapenem resistance KPC NOT DETECTED NOT DETECTED Final   Carbapenem resistance NDM NOT DETECTED NOT DETECTED Final   Carbapenem resist OXA 48 LIKE NOT DETECTED NOT DETECTED Final   Carbapenem resistance VIM NOT DETECTED NOT DETECTED Final    Comment: Performed at [facility], [address] [place], [place] [address]  Culture, blood (Routine X 2) w Reflex to ID Panel     Status: None   Collection Time: 05/19/22  3:54 AM   Specimen: BLOOD  Result Value Ref Range Status   Specimen Description BLOOD LEFT HAND  Final   Special Requests   Final    BOTTLES DRAWN AEROBIC AND ANAEROBIC Blood Culture results may not be optimal due to an inadequate volume of blood received in culture bottles   Culture   Final    NO GROWTH 5 DAYS Performed at [facility], [address], [place], [place] [address]    Report Status 05/24/2022 FINAL  Final  MRSA Next Gen by PCR, Nasal  Status: None   Collection Time: 05/21/22  3:22 AM   Specimen: Nasal Mucosa; Nasal Swab  Result Value Ref Range Status   MRSA by PCR Next  Gen NOT DETECTED NOT DETECTED Final    Comment: (NOTE) The GeneXpert MRSA Assay (FDA approved for NASAL specimens only), is one component of a comprehensive MRSA colonization surveillance program. It is not intended to diagnose MRSA infection nor to guide or monitor treatment for MRSA infections. Test performance is not FDA approved in patients less than 1 years old. Performed at [facility], [address] [place], [place] [address]   Culture, blood (Routine X 2) w Reflex to ID Panel     Status: None (Preliminary result)   Collection Time: 05/21/22  4:00 PM   Specimen: BLOOD  Result Value Ref Range Status   Specimen Description BLOOD BLOOD LEFT HAND  Final   Special Requests   Final    BOTTLES DRAWN AEROBIC AND ANAEROBIC Blood Culture results may not be optimal due to an inadequate volume of blood received in culture bottles   Culture   Final    NO GROWTH 3 DAYS Performed at [facility], [address], [place], [place] [address]    Report Status PENDING  Incomplete  Culture, blood (Routine X 2) w Reflex to ID Panel     Status: None (Preliminary result)   Collection Time: 05/21/22  5:49 PM   Specimen: BLOOD  Result Value Ref Range Status   Specimen Description BLOOD BLOOD LEFT HAND  Final   Special Requests   Final    BOTTLES DRAWN AEROBIC AND ANAEROBIC Blood Culture results may not be optimal due to an inadequate volume of blood received in culture bottles   Culture   Final    NO GROWTH 3 DAYS Performed at [facility], [address], [place], [place] [address]    Report Status PENDING  Incomplete  Culture, Respiratory w Gram Stain     Status: None (Preliminary result)   Collection Time: 05/23/22  9:57 AM   Specimen: Tracheal Aspirate; Respiratory  Result Value Ref Range Status   Specimen Description   Final    TRACHEAL ASPIRATE Performed at [facility], [address], [place], [place] [address]    Special Requests   Final     NONE Performed at [facility], [address] [place], [place] [address]    Gram Stain   Final    RARE WBC PRESENT, PREDOMINANTLY PMN RARE SQUAMOUS EPITHELIAL CELLS PRESENT RARE GRAM NEGATIVE RODS RARE YEAST    Culture   Final    NO GROWTH < 24 HOURS Performed at [facility], [address] [place], [place] [address]    Report Status PENDING  Incomplete  C Difficile Quick Screen (NO PCR Reflex)     Status: None   Collection Time: 05/23/22  4:39 PM   Specimen: STOOL  Result Value Ref Range Status   C Diff antigen NEGATIVE NEGATIVE Final   C Diff toxin NEGATIVE NEGATIVE Final   C Diff interpretation No C. difficile detected.  Final    Comment: Performed at [facility], [address] [place], [place] [address]    Coagulation Studies: Recent Labs    05/21/22 1236  LABPROT 23.2*  INR 2.1*     Urinalysis: No results for input(s): "COLORURINE", "LABSPEC", "PHURINE", "GLUCOSEU", "HGBUR", "BILIRUBINUR", "KETONESUR", "PROTEINUR", "UROBILINOGEN", "NITRITE", "LEUKOCYTESUR" in the last 72 hours.  Invalid input(s): "APPERANCEUR"  Imaging: US Venous Img Lower Bilateral (DVT)  Result Date: 05/22/2022 CLINICAL DATA:  Bilateral swelling EXAM: BILATERAL LOWER EXTREMITY VENOUS DOPPLER ULTRASOUND TECHNIQUE: Gray-scale sonography with compression, as well as color and duplex ultrasound, were performed to evaluate the deep venous system(s) from the level of the common femoral vein through the popliteal and proximal calf veins. COMPARISON:  None Available. FINDINGS: VENOUS Normal compressibility of the common femoral, superficial femoral, and popliteal veins, as well as the visualized calf veins. Visualized portions of profunda femoral vein and great saphenous vein unremarkable. No filling defects to suggest DVT on grayscale or color Doppler imaging. Doppler waveforms show normal direction of venous flow, normal respiratory plasticity and response to  augmentation. OTHER None. Limitations: none IMPRESSION: Negative. Electronically Signed   By: Lucrezia Europe M.D.   On: 05/22/2022 14:17     Medications:    albumin human Stopped (05/23/22 2254)   ceFEPime (MAXIPIME) IV Stopped (05/23/22 2222)   feeding supplement (VITAL 1.5 CAL) 50 mL/hr at 05/24/22 0900   heparin 750 Units/hr (05/24/22 0900)   norepinephrine (LEVOPHED) Adult infusion Stopped (05/24/22 0701)   propofol (DIPRIVAN) infusion 40 mcg/kg/min (05/24/22 0900)    sodium chloride   Intravenous Once   Chlorhexidine Gluconate Cloth  6 each Topical Daily   feeding supplement (PROSource TF20)  60 mL Per Tube BID   free water  30 mL Per Tube Q4H   furosemide  60 mg Intravenous Once   And   furosemide  60 mg Intravenous Once   multivitamin  15 mL Per Tube Daily   nutrition supplement (JUVEN)  1 packet Per Tube BID BM   mouth rinse  15 mL Mouth Rinse Q2H   pantoprazole (PROTONIX) IV  40 mg Intravenous Daily   potassium & sodium phosphates  2 packet Per Tube Q4H   ipratropium-albuterol, levalbuterol, mouth rinse  Assessment/ Plan:  Mr. Horatio Royle is a 67 y.o.  male  past medical conditions including atrial fibrillation on Eliquis, pacemaker, quadraplegia with spinal cord ischemia, PAD, and hypertension, who was admitted to [facility] on 05/21/2022 for Urinary retention [R33.9] Atrial fibrillation with RVR ([place]) [I48.91] Severe sepsis ([place]) [A41.9, R65.20] Urinary tract infection with hematuria, site unspecified [N39.0, R31.9] Sepsis, due to unspecified organism, unspecified whether acute organ dysfunction present ([place]) [A41.9]   Acute kidney injury likely secondary to bladder outlet obstruction and possibly ATN due to hypotension and infection. Baseline creatinine 1.22 on  04/27/22. Foley catheter placed by ED staff.  No IV contrast exposure.  -Creatinine continues to worsen slowly, despite adequate urine output - Patient remains acidotic, will stop oral sodium bicarb - Will  order one-time dose of furosemide 120 mg to manage edema and may also correct acidosis - If no improvement tomorrow, will consider hemodialysis versus CRRT.   2.  Hyponatremia: secondary to acute kidney injury.   Sodium 129 today, slightly improved from 127 yesterday.  Will continue to monitor.  3.  Anasarca/generalized edema. -Will order one-time dose IV furosemide 120 mg - Continue ordered albumin.   4. Bacteremia: enterobacter growing. Empiric vancomycin and cefepime.       LOS: 6 Delsie Amador 3/25/202410:03 AM

## 2022-05-24 NOTE — Progress Notes (Signed)
Date of Admission:  05/17/2022     ID: Mitchell [place] is a 67 y.o. male Principal Problem:   Severe sepsis ([place]) Active Problems:   Hypertension   Hyperlipidemia   AKI (acute kidney injury) ([place])   Chronic complete paraplegia ([facility])   Hypokalemia   Neurogenic bladder   Atrophic kidney   Atrial fibrillation with RVR (H[facility])   Hyponatremia   Elevated troponin   Metabolic acidosis   Hematuria   Urinary retention   Acute hypoxic respiratory failure (H[facility])   Toxic metabolic encephalopathy    Subjective: Pt doing poorly In ICU ventilated  Medications:   sodium chloride   Intravenous Once   Chlorhexidine Gluconate Cloth  6 each Topical Daily   etomidate       feeding supplement (PROSource TF20)  60 mL Per Tube BID   fentaNYL       free water  30 mL Per Tube Q4H   midodrine  10 mg Oral TID WC   multivitamin  15 mL Per Tube Daily   nutrition supplement (JUVEN)  1 packet Per Tube BID BM   mouth rinse  15 mL Mouth Rinse Q2H   pantoprazole (PROTONIX) IV  40 mg Intravenous Daily   rocuronium bromide        Objective: Vital signs in last 24 hours: Patient Vitals for the past 24 hrs:  BP Temp Temp src Pulse Resp SpO2 Weight  05/24/22 2045 -- -- -- -- -- 100 % --  05/24/22 1958 -- 97.7 F (36.5 C) Axillary -- -- -- --  05/24/22 1830 108/68 -- -- (!) 101 (!) 23 100 % --  05/24/22 1815 113/72 -- -- (!) 104 15 100 % --  05/24/22 1800 117/74 -- -- (!) 102 (!) 22 100 % --  05/24/22 1745 (!) 117/100 -- -- (!) 106 16 100 % --  05/24/22 1730 100/73 -- -- (!) 105 15 100 % --  05/24/22 1715 (!) 90/57 -- -- (!) 102 (!) 23 100 % --  05/24/22 1700 (!) 72/48 -- -- (!) 107 14 100 % --  05/24/22 1645 (!) 78/55 -- -- (!) 110 (!) 23 100 % --  05/24/22 1640 (!) 76/58 -- -- (!) 109 (!) 22 100 % --  05/24/22 1630 (!) 78/60 -- -- (!) 109 20 100 % --  05/24/22 1627 -- (!) 97.5 F (36.4 C) Axillary (!) 109 18 100 % --  05/24/22 1615 99/63 -- -- (!) 112 (!) 21 100 % --  05/24/22 1600  100/67 -- -- (!) 115 (!) 25 100 % --  05/24/22 1545 101/64 -- -- (!) 116 18 100 % --  05/24/22 1530 (!) 123/57 -- -- (!) 121 (!) 21 97 % --  05/24/22 1515 118/85 -- -- (!) 116 12 100 % --  05/24/22 1500 109/85 -- -- (!) 118 (!) 27 (!) 70 % --  05/24/22 1445 113/80 -- -- (!) 121 17 100 % --  05/24/22 1430 121/74 -- -- (!) 123 (!) 21 100 % --  05/24/22 1415 104/78 -- -- (!) 122 20 100 % --  05/24/22 1400 103/75 -- -- (!) 122 18 100 % --  05/24/22 1345 103/73 -- -- (!) 122 20 100 % --  05/24/22 1330 122/67 -- -- (!) 121 16 100 % --  05/24/22 1315 131/71 -- -- (!) 124 (!) 22 100 % --  05/24/22 1300 92/72 -- -- (!) 122 (!) 23 100 % --  05/24/22 1245 102/69 -- -- Marland Kitchen  126 (!) 22 100 % --  05/24/22 1234 -- 98.3 F (36.8 C) Axillary (!) 126 18 100 % --  05/24/22 1230 117/60 -- -- (!) 127 20 100 % --  05/24/22 1215 115/63 -- -- (!) 125 (!) 22 100 % --  05/24/22 1210 108/71 -- -- (!) 124 20 100 % --  05/24/22 1200 (!) 122/101 -- -- (!) 112 18 100 % --  05/24/22 1145 104/61 -- -- (!) 123 (!) 21 100 % --  05/24/22 1130 107/62 -- -- (!) 124 17 100 % --  05/24/22 1115 113/71 -- -- (!) 122 20 100 % --  05/24/22 1100 100/68 -- -- (!) 123 (!) 23 100 % --  05/24/22 1045 114/67 -- -- (!) 122 (!) 21 100 % --  05/24/22 1030 102/62 -- -- (!) 117 (!) 22 100 % --  05/24/22 1015 112/69 -- -- (!) 121 (!) 22 100 % --  05/24/22 1000 114/65 -- -- (!) 120 20 100 % --  05/24/22 0945 107/71 -- -- (!) 117 (!) 24 100 % --  05/24/22 0930 106/60 -- -- (!) 116 (!) 21 100 % --  05/24/22 0915 114/67 -- -- (!) 117 20 100 % --  05/24/22 0900 103/72 -- -- (!) 119 (!) 25 100 % --  05/24/22 0845 99/70 -- -- (!) 121 (!) 21 100 % --  05/24/22 0830 121/65 -- -- (!) 121 (!) 21 100 % --  05/24/22 0815 92/61 -- -- (!) 119 16 100 % --  05/24/22 0800 94/66 -- -- (!) 116 20 100 % --  05/24/22 0756 -- 98.6 F (37 C) Axillary (!) 120 19 100 % --  05/24/22 0745 (!) 135/57 -- -- (!) 121 18 100 % --  05/24/22 0730 109/71 -- -- (!) 123  (!) 23 100 % --  05/24/22 0715 97/60 -- -- (!) 122 20 100 % --  05/24/22 0700 (!) 80/63 -- -- (!) 121 16 100 % --  05/24/22 0645 (!) 89/63 -- -- (!) 122 (!) 22 100 % --  05/24/22 0630 104/65 -- -- (!) 126 (!) 23 100 % --  05/24/22 0615 116/72 -- -- (!) 129 (!) 26 100 % --  05/24/22 0600 112/72 -- -- (!) 129 (!) 25 100 % --  05/24/22 0545 108/67 -- -- (!) 129 (!) 29 100 % --  05/24/22 0530 109/73 -- -- (!) 129 (!) 26 100 % --  05/24/22 0515 106/69 -- -- (!) 127 (!) 22 100 % --  05/24/22 0500 106/62 98.3 F (36.8 C) Oral (!) 127 (!) 25 100 % 82.5 kg  05/24/22 0446 95/68 -- -- (!) 119 (!) 23 100 % --  05/24/22 0445 95/68 -- -- (!) 127 (!) 25 100 % --  05/24/22 0430 100/66 -- -- (!) 119 18 100 % --  05/24/22 0415 106/68 -- -- (!) 127 (!) 28 100 % --  05/24/22 0400 104/67 -- -- (!) 128 (!) 23 100 % --  05/24/22 0345 109/65 -- -- (!) 125 (!) 24 100 % --  05/24/22 0330 98/64 -- -- (!) 127 (!) 26 100 % --  05/24/22 0315 107/69 -- -- (!) 124 (!) 25 100 % --  05/24/22 0300 104/70 -- -- (!) 124 (!) 26 100 % --  05/24/22 0245 102/67 -- -- (!) 124 (!) 25 100 % --  05/24/22 0230 98/67 -- -- (!) 124 (!) 23 100 % --  05/24/22 0215 93/69 -- -- Marland Kitchen 123 Marland Kitchen)  24 100 % --  05/24/22 0200 95/63 98.3 F (36.8 C) Oral (!) 120 20 100 % --  05/24/22 0145 95/63 -- -- (!) 122 (!) 21 100 % --  05/24/22 0130 98/67 -- -- (!) 124 (!) 21 100 % --  05/24/22 0115 111/63 -- -- (!) 125 (!) 23 100 % --  05/24/22 0100 (!) 86/69 -- -- (!) 122 20 100 % --  05/24/22 0045 (!) 87/63 -- -- (!) 123 (!) 21 100 % --  05/24/22 0030 99/63 -- -- (!) 109 (!) 23 100 % --  05/24/22 0015 103/69 -- -- (!) 116 (!) 23 100 % --  05/24/22 0003 -- 98.3 F (36.8 C) Oral (!) 120 (!) 26 100 % --  05/24/22 0000 116/62 -- -- (!) 121 (!) 26 100 % --  05/23/22 2345 109/70 -- -- (!) 121 (!) 23 100 % --  05/23/22 2330 99/72 -- -- (!) 122 19 100 % --  05/23/22 2315 122/77 -- -- (!) 121 18 100 % --  05/23/22 2300 116/65 -- -- (!) 111 (!) 23 100 % --      LDA Rt IJ  PHYSICAL EXAM:  General: intubated sedated, pressor Lungs:b/la ir entry Heart: irregular Abdomen: Soft, non-tender,not distended.  Foley Bowel sounds normal. No masses Extremities: edema legs Skin: No rashes or lesions. Or bruising Lymph: Cervical, supraclavicular normal. Neurologic: decerebrate posturing  Lab Results    Latest Ref Rng & Units 05/24/2022    7:34 AM 05/23/2022    3:33 AM 05/22/2022    4:17 AM  CBC  WBC 4.0 - 10.5 K/uL 49.7  33.4  21.6   Hemoglobin 13.0 - 17.0 g/dL 8.0  8.6  9.2   Hematocrit 39.0 - 52.0 % 25.2  26.3  28.6   Platelets 150 - 400 K/uL 208  180  198        Latest Ref Rng & Units 05/24/2022   12:13 AM 05/23/2022    4:39 PM 05/23/2022    3:33 AM  CMP  Glucose 70 - 99 mg/dL 125  117  117   BUN 8 - 23 mg/dL 94  94  84   Creatinine 0.61 - 1.24 mg/dL 4.59  4.45  4.51   Sodium 135 - 145 mmol/L 129  127  130   Potassium 3.5 - 5.1 mmol/L 4.2  3.3  3.6   Chloride 98 - 111 mmol/L 106  104  106   CO2 22 - 32 mmol/L 16  16  16    Calcium 8.9 - 10.3 mg/dL 7.0  7.0  7.5       Microbiology: BC- enterobacter aerogenes Studies/Results: No results found.   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

## 2022-05-24 NOTE — Consult Note (Signed)
ANTICOAGULATION CONSULT NOTE   Pharmacy Consult for heparin Indication: atrial fibrillation  No Known Allergies  Patient Measurements: Height: 5\' 10"  (177.8 cm) Weight: 82.5 kg (181 lb 14.1 oz) (85.8-3) IBW/kg (Calculated) : 73 Heparin Dosing Weight: 81.3 kg  Vital Signs: Temp: 98.6 F (37 C) (03/25 0756) Temp Source: Axillary (03/25 0756) BP: 131/71 (03/25 1315) Pulse Rate: 124 (03/25 1315)  Labs: Recent Labs    05/22/22 0417 05/22/22 0845 05/22/22 1939 05/23/22 0333 05/23/22 1438 05/23/22 1639 05/24/22 0013 05/24/22 0734 05/24/22 0911  HGB 9.2*  --   --  8.6*  --   --   --  8.0*  --   HCT 28.6*  --   --  26.3*  --   --   --  25.2*  --   PLT 198  --   --  180  --   --   --  208  --   APTT  --  >200*   < > 107* 58*  --  82*  --  82*  HEPARINUNFRC  --  >1.10*  --  >1.10*  --   --  >1.10*  --   --   CREATININE 4.59*  --   --  4.51*  --  4.45* 4.59*  --   --    < > = values in this interval not displayed.     Estimated Creatinine Clearance: 16.8 mL/min (A) (by C-G formula based on SCr of 4.59 mg/dL (H)).   Medical History: Past Medical History:  Diagnosis Date   Ascending aortic dissection ([place])    a. 01/2017 s/p emergent repair @ [facility].   Cardiac pacemaker in situ    a. 03/2017 s/p MDT [place] (ser# [id number]).   Chronic HFrEF (heart failure with reduced ejection fraction) ([place])    a. 06/2016 Echo: EF 45%, diff HK, GrI DD; b. 03/2017 Echo: EF 55-60%, apical wma, Gr2 DD; c. 06/2017 Echo: EF 55-60%, apical, apical inf, and mid antsept wma; d. 07/2018 Echo: EF 45%, apical thinning & AK. Dil Asc Ao.   Essential hypertension    LV (left ventricular) mural thrombus    a. 06/2016 Echo: EF 45%, diff HK w/ thrombus (0.9 x 0.9 x.0.7cm), GrI DD, mild AI/MR.   Mixed hyperlipidemia    PAD (peripheral artery disease) (HCC)    PAF (paroxysmal atrial fibrillation) (HCC)    a. CHA2DS2VASc = 5-->chronic eliquis.   Paraplegia ([place])    a.  Spinal cord ischemia and injury  in the setting of aortic dissection in December 2018.   Stroke [facility]) 06/2016   Urinary incontinence    a. in setting of paraplegia - self catheterizes.    Medications:  PTA: Apixaban 5 mg BID (last dose on 3/21 at 2133) Inpatient: Heparin infusion 3/22 > Allergies: NKDA   Assessment: 67 yo M with PMH aortic dissection, paraplegia 2/2 spinal cord ischemia, PAD, HTN, Afib on Eliquis, pacemaker, neurogenic bladder w/ indwelling Foley presents with abdominal distention and anuria x 1 day. Was receiving apixaban but currently held due to gross hematuria that is being managed via gentle Foley catheter irrigation per urology recs. Pharmacy asked to initiate heparin for Afib (CHADSVASc 5) which will enable [place] to quickly stop [facility] if hematuria worsens.  Date Time aPTT/HL Rate/Comment 3/22 1236 44/>1.10 Baseline labs 3/22 1945 >200 / --- Supratherapeutic / Hold x1 hr then 1200 > 900 u/hr 3/23 0850 >200/> 1.1 Supratherapeutic / Hold x1 hr then 900 > 700 u/hr 3/23 1939  98 / ---  Therapeutic x1 / 700 u/hr 3/24 0333 107 / > 1.1 Supratherapeutic 3/24 1438 58 / ---  Subtherapeutic 3/25 0013 82 / > 1.1 aPTT therapeutic, HL not correlating  3/25 0911 82 / ---  aPTT therapeutic x 2  Goal of Therapy:  Heparin level 0.3-0.7 units/ml aPTT 66-102 seconds Monitor platelets by anticoagulation protocol: Yes    Plan:  Continue heparin infusion at 750 units/hr Recheck aPTT and HL with AM labs Titrate by aPTT's until lab correlation is noted, then titrate by anti-xa alone. Continue to monitor H&H and platelets daily while on heparin gtt.  Pearla Dubonnet, PharmD Clinical Pharmacist 05/24/2022 1:31 PM

## 2022-05-24 NOTE — Progress Notes (Signed)
Patient had episode of emesis upon turning with bath and bed change. Belly distended. Patient having liquid stools. Tube feeding stopped per Meda Coffee, NP and OG to LIS.

## 2022-05-24 NOTE — Consult Note (Signed)
PHARMACY CONSULT NOTE - FOLLOW UP  Pharmacy Consult for Electrolyte Monitoring and Replacement   Recent Labs: Potassium (mmol/L)  Date Value  05/24/2022 4.2   Magnesium (mg/dL)  Date Value  05/23/2022 2.4   Calcium (mg/dL)  Date Value  05/24/2022 7.0 (L)   Albumin (g/dL)  Date Value  05/21/2022 1.9 (L)  10/19/2016 4.2   Phosphorus (mg/dL)  Date Value  05/24/2022 2.1 (L)   Sodium (mmol/L)  Date Value  05/24/2022 129 (L)  02/17/2017 141    Assessment: 67 yo M with PMH aortic dissection, h/o paraplegia 2/2 spinal cord ischemia, HTN, Afib on Eliquis, pacemaker presents with abdominal distention and anuria x 1 day. Patient found to be in Afib w/ RVR. Imaging notable for markedly dilated bowel loop and c/f sigmoid or cecal volvulus. Patient in AKI but UOP improving. Suspect renal function secondary to obstructive uropathy and acute tubular necrosis.  Pt is currently on a ventilator. Scr not improved.   Pt was previously on furosemide IV continuous infusion, now stopped Pt is receiving nutrition supplement via tube BID. Tube feeds 50 ml/hr. Feeding supplement 60 ml BID,  Free water 30 ml q4H,   Goal of Therapy:  K >/= 4.0 and Mg >/= 2.0  Plan:  No replacement currently indicated. Defer Na to nephro team.  F/u with AM labs.    Pearla Dubonnet ,PharmD Clinical Pharmacist 05/24/2022 8:00 AM

## 2022-05-24 NOTE — IPAL (Signed)
  Interdisciplinary Goals of Care Family Meeting   Date carried out: 05/24/2022  Location of the meeting: Conference room  Member's involved: Physician, Bedside Registered Nurse, Family Member or next of kin, and Palliative care team member   GOALS OF CARE DISCUSSION  The Clinical status was relayed to family in detail- ALL S[facility] AT [place]  Updated and notified of patients medical condition- Patient remains unresponsive and will not open eyes to command.   Patient is having a weak cough and struggling to remove secretions.   Patient with increased WOB and using accessory muscles to breathe Explained to family course of therapy and the modalities   Patient with Progressive multiorgan failure with a very high probablity of a very minimal chance of meaningful recovery despite all aggressive and optimal medical therapy.   PROGRESSIVE MULTIORGAN FAILURE WITH POOR QUALITY OF LIFE PATIENT WOULD NOT WANT TO LIVE ON MACHINES HE WOULD NOT WANT HEMODIALYSIS  Family understands the situation.  They have consented and agreed to DNR/DNI and would like to proceed with Comfort care measures in next 24-48 hrs, will plan end of life visitation.   Family are satisfied with Plan of action and management. All questions answered  Additional CC time 35 mins   Brelynn Wheller Patricia Pesa, M.D.  [facility]  Medical Director [place] Director [facility] Cardio-Pulmonary Department

## 2022-05-24 NOTE — Progress Notes (Signed)
NAME:  Mitchell Dixon, MRN:  [id number], DOB:  1956/02/09, LOS: 6 ADMISSION DATE:  05/04/2022, CHIEF COMPLAINT:  respiratory failure   History of Present Illness:   67 year old male with a history of type 1 aortic dissection status post emergent repair December 2018, paraplegic secondary to spinal cord ischemia and injury at the time of aortic dissection, hypertension, hyperlipidemia, Chronic HFrEF (heart failure with reduced ejection fraction), paroxysmal atrial fibrillation on chronic apixaban, permanent pacemaker, and LV thrombus (May 2018), chronic urinary retention, who presented to the ED with chief complaints of abdominal distention and urinary retention x 1 day.   Per ED reports, patient was found to be in A-fib with RVR with heart rate in the [id number] and systolic blood pressure in the 80s.  Patient was complaining of shortness of breath and dizziness.   ED Course: Initial vital signs showed HR of 179 beats/minute, BP 117/105 mm Hg, the RR 39 breaths/minute, and the oxygen saturation 95% on RA and a temperature of 97.60F (36.9C).    Pertinent Labs/Diagnostics Findings: Na+/ K+: 130/3.0 glucose: 129 BUN/Cr.:62/5.11 WBC: 25.7 Lactic acid: 8.1 Troponin: 52 BNP: 266 UA positive for UTI Imaging: See below Medications Administered: Cefepime, diltiazem, sodium chloride 2 L bolus, sodium chloride 500 mL bolus, diltiazem GGT.    Hospital Course: Patient was admitted to PCU under hospitalist service for management of sepsis secondary to suspected pneumonia and UTI, A-fib with RVR and AKI with multiple metabolic derangement. SEE SIGNIFICANT EVENTS BELOW  Pertinent  Medical History  Ascending aortic dissection ([place]) 01/2017 s/p emergent repair @ [facility]. Cardiac pacemaker in situ              03/2017 s/p MDT [place] (ser# [id number] S). Chronic HFrEF (heart failure with reduced ejection fraction) ([place])         06/2016 Echo: EF 45%, diff HK, GrI DD; b. 03/2017 Echo: EF 55-60%, apical  wma, Gr2 DD; c. 06/2017 Echo: EF 55-60%, apical, apical inf, and mid antsept wma; d. 07/2018 Echo: EF 45%, apical thinning & AK. Dil Asc Ao. Essential hypertension        LV (left ventricular) mural thrombus           06/2016 Echo: EF 45%, diff HK w/ thrombus (0.9 x 0.9 x.0.7cm), GrI DD, mild AI/MR. Mixed hyperlipidemia          PAD (peripheral artery disease) (HCC)      PAF (paroxysmal atrial fibrillation) (HCC)              CHA2DS2VASc = 5-->chronic eliquis. Paraplegia ([place])    Spinal cord ischemia and injury in the setting of aortic dissection in December 2018. Stroke [facility])        06/2016 Urinary incontinence           in setting of paraplegia - self catheterizes.  Significant Hospital Events: Including procedures, antibiotic start and stop dates in addition to other pertinent events   3/19: Admitted to PCU with sepsis secondary to obstructive uropathy, AKI with multiple electrolyte derangements and A-fib with RVR. 3/20: Nephrology consulted recs no indication for dialysis continue to with IV fluids 3/21: Urology consulted for worsening gross hematuria recommends manual irrigation of the Foley holding Eliquis and trending CBC. ID consulted for Klebsiella aeruginosa bacteremia  3/22: Rapid response called for acute respiratory distress and complaint of lower back pain.  Patient transferred to the ICU and emergently intubated for airway protection. 3/25 remains on vent   Interim History / Subjective:  Remains  on vent Remains encephalopathic Plan for SAT/SBT   Objective   Blood pressure (!) 89/63, pulse (!) 122, temperature 98.3 F (36.8 C), temperature source Oral, resp. rate (!) 22, height 5\' 10"  (1.778 m), weight 82.5 kg, SpO2 100 %.    Vent Mode: PRVC FiO2 (%):  [35 %] 35 % Set Rate:  [20 bmp-22 bmp] 22 bmp Vt Set:  [500 mL] 500 mL PEEP:  [5 cmH20] 5 cmH20 Plateau Pressure:  [8 cmH20-16 cmH20] 16 cmH20   Intake/Output Summary (Last 24 hours) at 05/24/2022 0714 Last data filed  at 05/24/2022 [id number] Gross per 24 hour  Intake 3070.39 ml  Output 600 ml  Net 2470.39 ml    Filed Weights   05/22/22 0340 05/23/22 0500 05/24/22 0500  Weight: 82.6 kg 81.8 kg 82.5 kg     REVIEW OF SYSTEMS  PATIENT IS UNABLE TO PROVIDE COMPLETE REVIEW OF SYSTEMS DUE TO SEVERE CRITICAL ILLNESS   PHYSICAL EXAMINATION:  GENERAL:critically ill appearing, +resp distress EYES: Pupils equal, round, reactive to light.  No scleral icterus.  MOUTH: Moist mucosal membrane. INTUBATED NECK: Supple.  PULMONARY: Lungs clear to auscultation, +rhonchi,  CARDIOVASCULAR: S1 and S2.  Regular rate and rhythm GASTROINTESTINAL: Soft, nontender, -distended. Positive bowel sounds.  MUSCULOSKELETAL:  edema.  NEUROLOGIC: obtunded,sedated SKIN:multiple skin lesions, abrasions, wounds POA Capillary refill delayed  Pulses present bilaterally     Assessment & Plan:   67 year old male with a history of multiple medical problems including paraplegia secondary to spinal cord ischemia, HFrEF, afib, and LV thrombus who presented to the hospital with urinary retention and developed sepsis due to gram negative bacteremia. Patient's mental status worsened with hypotension and a disconjugate gaze with severe metabolic encephalopathy and was subsequently intubated for airway protection.    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 Patient with history of Afib and will be on anti-coagulation (switched from eliquis to heparin gtt). Repeat imaging of the chest shows the size of his aorta to be similar, less concerning for a ruptured aneurysm/worsening dissection. Poor kidney function precludes [place] from being able to administer contrast. He is on low dose nor-epinephrine with good response (not required when off prop). -goal MAP > 65 mmHg -heparin gtt for afib and history of LV thrombus   ACUTE KIDNEY INJURY/Renal Failure -continue Foley Catheter-assess need -Avoid nephrotoxic agents -Follow urine output, BMP -Ensure adequate renal perfusion, optimize oxygenation -Renal dose medications   Intake/Output Summary (Last 24 hours) at 05/24/2022 0720 Last data filed at 05/24/2022 0658 Gross per 24 hour  Intake 3070.39 ml  Output 600 ml  Net 2470.39 ml   Renal consult appreciated. Patient with metabolic  acidosis, no lactic acid or Beta-hydroxybutarate elevation. This is likely secondary to AKI and uremia. Bicarbonate infusion discontinued due to minimal urine output, overall positive fluid balance, and anasarca.   Initial presentation was secondary to urinary tract infection secondary to retention. Foley catheter in place and urology following. PRN bladder scans in place, and foley catheter was flushed secondary to finding of calculi in the catheter. Patient is a poor candidate for dialysis. Trialed albumin yesterday after discussion with our nephrology colleagues. Overall fluid balance remains significantly up. I have discussed this with the patient's sister  and explained to her the poor prognosis.   INFECTIOUS DISEASE GRAM NEG BACTERMIA -continue antibiotics as prescribed -follow up cultures -follow up ID consultation    ACUTE ANEMIA- TRANSFUSE AS NEEDED CONSIDER TRANSFUSION  IF HGB<7   ENDO - ICU hypoglycemic\Hyperglycemia protocol -check FSBS per protocol   GI GI PROPHYLAXIS as indicated  NUTRITIONAL STATUS DIET-->TF's as tolerated Constipation protocol as indicated   ELECTROLYTES -follow labs as needed -replace as needed -pharmacy consultation and following   Best Practice (right click and "Reselect all SmartList Selections" daily)   Diet/type: tubefeeds DVT prophylaxis: systemic heparin GI prophylaxis: PPI Lines: Central line Foley:  Yes, and it is still needed Code Status:  full code  Labs   CBC: Recent Labs  Lab 05/19/22 0930 05/20/22 0912 05/21/22 0236 05/22/22 0417 05/23/22 0333  WBC 25.0* 19.0* 15.7* 21.6* 33.4*  NEUTROABS  --   --  9.5*  --   --   HGB 11.7* 10.8* 10.3* 9.2* 8.6*  HCT 36.5* 33.1* 31.8* 28.6* 26.3*  MCV 93.4 90.9 92.2 93.2 94.3  PLT 195 191 217 198 180     Basic Metabolic Panel: Recent Labs  Lab 05/19/22 0930 05/20/22 0912 05/21/22 0236 05/21/22 0618 05/21/22 1528 05/22/22 0417 05/23/22 0333 05/23/22 1639 05/24/22 0013  NA 129*   < > 127* 129* 125* 125* 130* 127* 129*  K 3.2*   < > 2.9* 3.0* 3.5 3.5 3.6 3.3* 4.2  CL 102   < > 101 98 98 100 106 104 106  CO2 13*   < > 15* 16* 17* 16* 16* 16* 16*  GLUCOSE 69*   < > 124* 169* 116* 136* 117* 117* 125*  BUN 63*   < > 64* 66* 64* 69* 84* 94* 94*  CREATININE 4.22*   < > 4.49* 4.37* 4.56* 4.59* 4.51* 4.45* 4.59*  CALCIUM 7.5*   < > 6.9* 7.3* 7.2* 7.2* 7.5* 7.0* 7.0*  MG 1.8  --  2.5*  --   --  2.5* 2.4 2.4  --   PHOS  --   --   --  2.9  --  2.4* 1.6* 1.7* 2.1*   < > = values in this interval not displayed.    GFR: Estimated Creatinine Clearance: 16.8 mL/min (A) (by C-G formula based on SCr of 4.59 mg/dL (H)). Recent  Labs  Lab 05/19/22 1409 05/20/22 0912 05/21/22 0236 05/21/22 0618 05/22/22 0417 05/23/22 0333  WBC  --  19.0* 15.7*  --  21.6* 33.4*  LATICACIDVEN 2.3* 1.4 2.9* 1.4  --   --      Liver Function Tests: Recent Labs  Lab 05/12/2022 1945 05/21/22 0236 05/21/22 0618  AST 64* 35  --   ALT 27 16  --   ALKPHOS 145* 98  --   BILITOT 1.8* 1.0  --   PROT 7.5 5.5*  --   ALBUMIN 2.6* 1.8* 1.9*    No results for  input(s): "LIPASE", "AMYLASE" in the last 168 hours. No results for input(s): "AMMONIA" in the last 168 hours.  ABG    Component Value Date/Time   PHART 7.34 (L) 05/22/2022 0853   PCO2ART 27 (L) 05/22/2022 0853   PO2ART 134 (H) 05/22/2022 0853   HCO3 15.3 (L) 05/23/2022 0920   ACIDBASEDEF 12.3 (H) 05/23/2022 0920   O2SAT 47.5 05/23/2022 0920     Coagulation Profile: Recent Labs  Lab 05/19/22 0328 05/21/22 1236  INR 2.7* 2.1*     Cardiac Enzymes: No results for input(s): "CKTOTAL", "CKMB", "CKMBINDEX", "TROPONINI" in the last 168 hours.  HbA1C: Hgb A1c MFr Bld  Date/Time Value Ref Range Status  07/20/2016 06:38 PM 5.7 (H) 4.8 - 5.6 % Final    Comment:             Pre-diabetes: 5.7 - 6.4          Diabetes: >6.4          Glycemic control for adults with diabetes: <7.0     CBG: Recent Labs  Lab 05/23/22 1113 05/23/22 1632 05/23/22 1915 05/24/22 0019 05/24/22 0312  GLUCAP 133* 105* 101* 113* 106*      Past Medical History:  He,  has a past medical history of Ascending aortic dissection ([place]), Cardiac pacemaker in situ, Chronic HFrEF (heart failure with reduced ejection fraction) ([place]), Essential hypertension, LV (left ventricular) mural thrombus, Mixed hyperlipidemia, PAD (peripheral artery disease) ([place]), PAF (paroxysmal atrial fibrillation) ([place]), Paraplegia ([place]), Stroke ([place]) (06/2016), and Urinary incontinence.   Surgical History:   Past Surgical History:  Procedure Laterality Date   DIALYSIS/PERMA CATHETER INSERTION N/A 02/25/2017    Procedure: DIALYSIS/PERMA CATHETER INSERTION;  Surgeon: Algernon Huxley, MD;  Location: [place] CV LAB;  Service: Cardiovascular;  Laterality: N/A;   EMBOLECTOMY Right 02/23/2017   Procedure: EMBOLECTOMY;  Surgeon: Katha Cabal, MD;  Location: [facility] ORS;  Service: Vascular;  Laterality: Right;   FASCIECTOMY Right 02/23/2017   Procedure: FASCIECTOMY;  Surgeon: Katha Cabal, MD;  Location: [facility] ORS;  Service: Vascular;  Laterality: Right;   HERNIA REPAIR       Social History:   reports that he has been smoking cigarettes. He has a 7.50 pack-year smoking history. He has never used smokeless tobacco. He reports that he does not drink alcohol and does not use drugs.   Family History:  His family history includes Dementia in his mother; Heart failure in his mother and sister; Hypertension in his mother and sister; Pneumonia in his father.   Allergies No Known Allergies   Home Medications  Prior to Admission medications   Medication Sig Start Date End Date Taking? Authorizing Provider  atorvastatin (LIPITOR) 40 MG tablet Take 40 mg by mouth every evening.   Yes [provider]  ELIQUIS 5 MG TABS tablet Take 5 mg by mouth 2 (two) times daily.   Yes [provider]  metoprolol tartrate (LOPRESSOR) 50 MG tablet Take 50 mg by mouth 2 (two) times daily.   Yes [provider]  budesonide (PULMICORT) 0.25 MG/2ML nebulizer solution Take 2 mLs (0.25 mg total) by nebulization every 6 (six) hours. Patient not taking: Reported on 03/21/2022 02/26/17   Tukov-Yual, Arlyss Gandy, NP  ipratropium-albuterol (DUONEB) 0.5-2.5 (3) MG/3ML SOLN Take 3 mLs by nebulization every 6 (six) hours. Patient not taking: Reported on 03/21/2022 02/26/17   Erlene Quan, NP      DVT/GI PRX  assessed I Assessed the need for Labs I Assessed the  need for Foley I Assessed the need for Central Venous Line Family Discussion when available I Assessed the need for Mobilization I  made an Assessment of medications to be adjusted accordingly Safety Risk assessment completed  CASE DISCUSSED IN MULTIDISCIPLINARY ROUNDS WITH ICU TEAM   Critical Care Time devoted to patient care services described in this note is 55 minutes.  Critical care was necessary to treat /prevent imminent and life-threatening deterioration. Overall, patient is critically ill, prognosis is guarded.  Patient with Multiorgan failure and at high risk for cardiac arrest and death.    Corrin Parker, M.D.  [facility]  Medical Director [place] Director [facility] Cardio-Pulmonary Department

## 2022-05-24 NOTE — Consult Note (Signed)
ANTICOAGULATION CONSULT NOTE   Pharmacy Consult for heparin Indication: atrial fibrillation  No Known Allergies  Patient Measurements: Height: 5\' 10"  (177.8 cm) Weight: 81.8 kg (180 lb 5.4 oz) (84.8-3) IBW/kg (Calculated) : 73 Heparin Dosing Weight: 81.3 kg  Vital Signs: Temp: 98.2 F (36.8 C) (03/24 1930) Temp Source: Oral (03/25 0003) BP: 116/62 (03/25 0000) Pulse Rate: 121 (03/25 0000)  Labs: Recent Labs     0000 05/21/22 0236 05/21/22 0618 05/21/22 1236 05/21/22 1528 05/22/22 0417 05/22/22 0845 05/22/22 1939 05/23/22 0333 05/23/22 1438 05/23/22 1639 05/24/22 0013  HGB  --  10.3*  --   --   --  9.2*  --   --  8.6*  --   --   --   HCT  --  31.8*  --   --   --  28.6*  --   --  26.3*  --   --   --   PLT  --  217  --   --   --  198  --   --  180  --   --   --   APTT  --   --   --  44*   < >  --  >200*   < > 107* 58*  --  82*  LABPROT  --   --   --  23.2*  --   --   --   --   --   --   --   --   INR  --   --   --  2.1*  --   --   --   --   --   --   --   --   HEPARINUNFRC   < >  --   --  >1.10*  --   --  >1.10*  --  >1.10*  --   --  >1.10*  CREATININE  --  4.49* 4.37*  --    < > 4.59*  --   --  4.51*  --  4.45* 4.59*  TROPONINIHS  --  44* 49*  --   --   --   --   --   --   --   --   --    < > = values in this interval not displayed.     Estimated Creatinine Clearance: 16.8 mL/min (A) (by C-G formula based on SCr of 4.59 mg/dL (H)).   Medical History: Past Medical History:  Diagnosis Date   Ascending aortic dissection ([place])    a. 01/2017 s/p emergent repair @ [facility].   Cardiac pacemaker in situ    a. 03/2017 s/p MDT [place] (ser# [id number]).   Chronic HFrEF (heart failure with reduced ejection fraction) ([facility] and E)    a. 06/2016 Echo: EF 45%, diff HK, GrI DD; b. 03/2017 Echo: EF 55-60%, apical wma, Gr2 DD; c. 06/2017 Echo: EF 55-60%, apical, apical inf, and mid antsept wma; d. 07/2018 Echo: EF 45%, apical thinning & AK. Dil Asc Ao.   Essential hypertension     LV (left ventricular) mural thrombus    a. 06/2016 Echo: EF 45%, diff HK w/ thrombus (0.9 x 0.9 x.0.7cm), GrI DD, mild AI/MR.   Mixed hyperlipidemia    PAD (peripheral artery disease) (HCC)    PAF (paroxysmal atrial fibrillation) (HCC)    a. CHA2DS2VASc = 5-->chronic eliquis.   Paraplegia ([place])    a.  Spinal cord ischemia and injury in the setting of aortic dissection in  December 2018.   Stroke [facility]) 06/2016   Urinary incontinence    a. in setting of paraplegia - self catheterizes.    Medications:  PTA: Apixaban 5 mg BID (last dose on 3/21 at 2133) Inpatient: Heparin infusion 3/22 > Allergies: NKDA   Assessment: 67 yo M with PMH aortic dissection, paraplegia 2/2 spinal cord ischemia, PAD, HTN, Afib on Eliquis, pacemaker, neurogenic bladder w/ indwelling Foley presents with abdominal distention and anuria x 1 day. Was receiving apixaban but currently held due to gross hematuria that is being managed via gentle Foley catheter irrigation per urology recs. Pharmacy asked to initiate heparin for Afib (CHADSVASc 5) which will enable [place] to quickly stop [facility] if hematuria worsens.  Date Time aPTT/HL Rate/Comment 3/22 1236 44/>1.10 Baseline labs 3/22 1945 >200 / --- Supratherapeutic / Hold x1 hr then 1200 > 900 u/hr 3/23 0850 >200/> 1.1 Supratherapeutic / Hold x1 hr then 900 > 700 u/hr 3/23 1939 98 / ---  Therapeutic x1 / 700 u/hr 3/24 0333 107 / > 1.1 Supratherapeutic 3/24 1438 58 / ---  Subtherapeutic 3/25 0013 82 / > 1.1 aPTT therapeutic, HL not correlating   Goal of Therapy:  Heparin level 0.3-0.7 units/ml aPTT 66-102 seconds Monitor platelets by anticoagulation protocol: Yes    Plan:  Continue heparin infusion at 750 units/hr Recheck aPTT in 8 hr to confirm Titrate by aPTT's until lab correlation is noted, then titrate by anti-xa alone. Continue to monitor H&H and platelets daily while on heparin gtt.  Renda Rolls, PharmD, [facility] 05/24/2022 12:42 AM

## 2022-05-25 DIAGNOSIS — R6520 Severe sepsis without septic shock: Secondary | ICD-10-CM | POA: Diagnosis not present

## 2022-05-25 DIAGNOSIS — Z515 Encounter for palliative care: Secondary | ICD-10-CM | POA: Diagnosis not present

## 2022-05-25 DIAGNOSIS — A419 Sepsis, unspecified organism: Secondary | ICD-10-CM | POA: Diagnosis not present

## 2022-05-25 DIAGNOSIS — R0609 Other forms of dyspnea: Secondary | ICD-10-CM | POA: Diagnosis not present

## 2022-05-25 LAB — APTT: aPTT: 73 seconds — ABNORMAL HIGH (ref 24–36)

## 2022-05-25 LAB — TRIGLYCERIDES: Triglycerides: 140 mg/dL (ref <150)

## 2022-05-25 LAB — GLUCOSE, CAPILLARY: Glucose-Capillary: 75 mg/dL (ref 70–99)

## 2022-05-25 LAB — HEPARIN LEVEL (UNFRACTIONATED): Heparin Unfractionated: >1.1 IU/mL — ABNORMAL HIGH (ref 0.30–0.70)

## 2022-05-25 MED ORDER — GLYCOPYRROLATE 0.2 MG/ML IJ SOLN: 0.2000 mg | INTRAMUSCULAR | Status: DC | PRN

## 2022-05-25 MED ORDER — MORPHINE 100MG IN NS 100ML (1MG/ML) PREMIX INFUSION
1.0000 mg/h | INTRAVENOUS | Status: DC
  Administered 2022-05-25 – 2022-05-26 (×3): 5 mg/h via INTRAVENOUS
  Filled 2022-05-25 (×3): qty 100

## 2022-05-25 MED ORDER — ACETAMINOPHEN 325 MG PO TABS: 650.0000 mg | ORAL_TABLET | Freq: Four times a day (QID) | ORAL | Status: DC | PRN

## 2022-05-25 MED ORDER — GLYCOPYRROLATE 1 MG PO TABS: 1.0000 mg | ORAL_TABLET | ORAL | Status: DC | PRN

## 2022-05-25 MED ORDER — ACETAMINOPHEN 650 MG RE SUPP: 650.0000 mg | Freq: Four times a day (QID) | RECTAL | Status: DC | PRN

## 2022-05-25 MED ORDER — LORAZEPAM 2 MG/ML IJ SOLN: 1.0000 mg | INTRAMUSCULAR | Status: DC | PRN

## 2022-05-25 MED ORDER — DEXTROSE 50 % IV SOLN
INTRAVENOUS | Status: AC
  Administered 2022-05-25: 12.5 g via INTRAVENOUS
  Filled 2022-05-25: qty 50

## 2022-05-25 MED ORDER — LORAZEPAM 2 MG/ML IJ SOLN
1.0000 mg | INTRAMUSCULAR | Status: DC | PRN
  Administered 2022-05-25: 1 mg via INTRAVENOUS
  Filled 2022-05-25: qty 1

## 2022-05-25 MED ORDER — GLYCOPYRROLATE 0.2 MG/ML IJ SOLN
0.4000 mg | Freq: Four times a day (QID) | INTRAMUSCULAR | Status: DC
  Administered 2022-05-25 – 2022-05-26 (×2): 0.4 mg via INTRAVENOUS
  Filled 2022-05-25 (×3): qty 2

## 2022-05-25 MED ORDER — SODIUM CHLORIDE 0.9 % IV SOLN: INTRAVENOUS | Status: DC

## 2022-05-25 MED ORDER — MORPHINE BOLUS VIA INFUSION
5.0000 mg | INTRAVENOUS | Status: DC | PRN
  Administered 2022-05-25 (×4): 5 mg via INTRAVENOUS

## 2022-05-25 MED ORDER — POLYVINYL ALCOHOL 1.4 % OP SOLN: 1.0000 [drp] | Freq: Four times a day (QID) | OPHTHALMIC | Status: DC | PRN

## 2022-05-25 MED ORDER — MORPHINE BOLUS VIA INFUSION: 1.0000 mg | INTRAVENOUS | Status: DC | PRN

## 2022-05-25 NOTE — Consult Note (Signed)
ANTICOAGULATION CONSULT NOTE   Pharmacy Consult for heparin Indication: atrial fibrillation  No Known Allergies  Patient Measurements: Height: 5\' 10"  (177.8 cm) Weight: 83.3 kg (183 lb 10.3 oz) IBW/kg (Calculated) : 73 Heparin Dosing Weight: 81.3 kg  Vital Signs: Temp: 97.4 F (36.3 C) (03/26 0000) Temp Source: Axillary (03/26 0000) BP: 94/59 (03/26 0400) Pulse Rate: 103 (03/26 0400)  Labs: Recent Labs    05/23/22 0333 05/23/22 1438 05/23/22 1639 05/24/22 0013 05/24/22 0734 05/24/22 0911 05/25/22 0447  HGB 8.6*  --   --   --  8.0*  --   --   HCT 26.3*  --   --   --  25.2*  --   --   PLT 180  --   --   --  208  --   --   APTT 107*   < >  --  82*  --  82* 73*  HEPARINUNFRC >1.10*  --   --  >1.10*  --   --  >1.10*  CREATININE 4.51*  --  4.45* 4.59*  --   --   --    < > = values in this interval not displayed.     Estimated Creatinine Clearance: 16.8 mL/min (A) (by C-G formula based on SCr of 4.59 mg/dL (H)).   Medical History: Past Medical History:  Diagnosis Date   Ascending aortic dissection ([place])    a. 01/2017 s/p emergent repair @ [facility].   Cardiac pacemaker in situ    a. 03/2017 s/p MDT [place] (ser# [id number] S).   Chronic HFrEF (heart failure with reduced ejection fraction) ([place])    a. 06/2016 Echo: EF 45%, diff HK, GrI DD; b. 03/2017 Echo: EF 55-60%, apical wma, Gr2 DD; c. 06/2017 Echo: EF 55-60%, apical, apical inf, and mid antsept wma; d. 07/2018 Echo: EF 45%, apical thinning & AK. Dil Asc Ao.   Essential hypertension    LV (left ventricular) mural thrombus    a. 06/2016 Echo: EF 45%, diff HK w/ thrombus (0.9 x 0.9 x.0.7cm), GrI DD, mild AI/MR.   Mixed hyperlipidemia    PAD (peripheral artery disease) (HCC)    PAF (paroxysmal atrial fibrillation) (HCC)    a. CHA2DS2VASc = 5-->chronic eliquis.   Paraplegia ([place])    a.  Spinal cord ischemia and injury in the setting of aortic dissection in December 2018.   Stroke [facility]) 06/2016   Urinary  incontinence    a. in setting of paraplegia - self catheterizes.    Medications:  PTA: Apixaban 5 mg BID (last dose on 3/21 at 2133) Inpatient: Heparin infusion 3/22 > Allergies: NKDA   Assessment: 67 yo M with PMH aortic dissection, paraplegia 2/2 spinal cord ischemia, PAD, HTN, Afib on Eliquis, pacemaker, neurogenic bladder w/ indwelling Foley presents with abdominal distention and anuria x 1 day. Was receiving apixaban but currently held due to gross hematuria that is being managed via gentle Foley catheter irrigation per urology recs. Pharmacy asked to initiate heparin for Afib (CHADSVASc 5) which will enable [place] to quickly stop [facility] if hematuria worsens.  Date Time aPTT/HL Rate/Comment 3/22 1236 44/>1.10 Baseline labs 3/22 1945 >200 / --- Supratherapeutic / Hold x1 hr then 1200 > 900 u/hr 3/23 0850 >200/> 1.1 Supratherapeutic / Hold x1 hr then 900 > 700 u/hr 3/23 1939 98 / ---  Therapeutic x1 / 700 u/hr 3/24 0333 107 / > 1.1 Supratherapeutic 3/24 1438 58 / ---  Subtherapeutic 3/25 0013 82 / > 1.1 aPTT therapeutic,  HL not correlating  3/25 0911 82 / ---  aPTT therapeutic x 2 3/26     0447   73 / ? 1.1         aPTT therapeutic X 3   Goal of Therapy:  Heparin level 0.3-0.7 units/ml aPTT 66-102 seconds Monitor platelets by anticoagulation protocol: Yes    Plan:  Continue heparin infusion at 750 units/hr Recheck aPTT and HL with AM labs Titrate by aPTT's until lab correlation is noted, then titrate by anti-xa alone. Continue to monitor H&H and platelets daily while on heparin gtt.  Orene Desanctis, PharmD Clinical Pharmacist 05/25/2022 5:55 AM

## 2022-05-25 NOTE — Progress Notes (Signed)
Patient ID: Mitchell Dixon, male   DOB: Jul 09, 1955, 67 y.o.   MRN: [id number]    Progress Note from the Palliative Medicine Team at [facility]   Patient Name: Mitchell Dixon        Date: 05/25/2022 DOB: 07/10/1955  Age: 67 y.o. MRN#: [id number] Attending Physician: Flora Lipps, MD Primary Care Physician: Donnie Coffin, MD Admit Date: 05/04/2022   Medical records reviewed   67 y.o. male   admitted on 05/11/2022 with past medical  history of type 1 aortic dissection status post emergent repair December 2018, paraplegic secondary to spinal cord ischemia and injury at the time of aortic dissection, hypertension, hyperlipidemia, Chronic HFrEF (heart failure with reduced ejection fraction), paroxysmal atrial fibrillation on chronic apixaban, permanent pacemaker, and LV thrombus (May 2018), chronic urinary retention, who presented to the ED with chief complaints of abdominal distention and urinary retention x 1 day.  ED report; patient was found to be in A-fib with RVR with heart rate in the A999333 and systolic blood pressure in the 80s.  Patient was complaining of shortness of breath and dizziness.     3/19: Admitted to PCU with sepsis secondary to obstructive uropathy, AKI with multiple electrolyte derangements and A-fib with RVR. 3/20: Nephrology consulted recs no indication for dialysis continue to with IV fluids 3/21: Urology consulted for worsening gross hematuria recommends manual irrigation of the Foley holding Eliquis and trending CBC. ID consulted for Klebsiella aeruginosa bacteremia  3/22: Rapid response called for acute respiratory distress and complaint of lower back pain.  Patient transferred to the ICU and emergently intubated for airway protection. 3/25 remains on vent-- Dr Mortimer Fries updated family on medical situation  05-25-22 decision for one way extubation and focus is comfort and dignity allowing for a natural death    This NP met with family as discussed yesterday  for their presence for patient's one-way extubation and shift to a full comfort path allowing for natural death.  Education offered on the steps to extubation, utilization of medications to manage symptoms.  Education offered on the natural trajectory and expectations at end-of-life. Questions and concerns addressed.  Declined spiritual care support.  Focus of care is comfort, quality, dignity allowing for natural death  Plan of care -DNR/DNI -No artificial feeding or hydration now or in the future -No further diagnostics or life-prolonging measures -Symptom management      -Pain/ dyspnea: Initiate morphine drip with bolus option      -Agitation: Ativan      -Terminal secretions: Robinul -Prognosis is likely hours to days   Questions and concerns addressed   Discussed with Dr Mortimer Fries    Time: 75 minutes  Detailed review of medical records, medically appropriate exam,   discussed with treatment team, counseling and education to patient, family, staff, documenting clinical information, medication management, coordination of care    Wadie Lessen NP  Palliative Medicine Team Team Phone # [phone] Pager [phone]

## 2022-05-25 NOTE — Progress Notes (Signed)
NAME:  Mitchell Dixon, MRN:  [id number], DOB:  20-May-1955, LOS: 7 ADMISSION DATE:  05/17/2022, CHIEF COMPLAINT:  respiratory failure   History of Present Illness:   67 year old male with a history of type 1 aortic dissection status post emergent repair December 2018, paraplegic secondary to spinal cord ischemia and injury at the time of aortic dissection, hypertension, hyperlipidemia, Chronic HFrEF (heart failure with reduced ejection fraction), paroxysmal atrial fibrillation on chronic apixaban, permanent pacemaker, and LV thrombus (May 2018), chronic urinary retention, who presented to the ED with chief complaints of abdominal distention and urinary retention x 1 day.   Per ED reports, patient was found to be in A-fib with RVR with heart rate in the A999333 and systolic blood pressure in the 80s.  Patient was complaining of shortness of breath and dizziness.   ED Course: Initial vital signs showed HR of 179 beats/minute, BP 117/105 mm Hg, the RR 39 breaths/minute, and the oxygen saturation 95% on RA and a temperature of 97.70F (36.9C).    Pertinent Labs/Diagnostics Findings: Na+/ K+: 130/3.0 glucose: 129 BUN/Cr.:62/5.11 WBC: 25.7 Lactic acid: 8.1 Troponin: 52 BNP: 266 UA positive for UTI Imaging: See below Medications Administered: Cefepime, diltiazem, sodium chloride 2 L bolus, sodium chloride 500 mL bolus, diltiazem GGT.    Hospital Course: Patient was admitted to PCU under hospitalist service for management of sepsis secondary to suspected pneumonia and UTI, A-fib with RVR and AKI with multiple metabolic derangement. SEE SIGNIFICANT EVENTS BELOW  Pertinent  Medical History  Ascending aortic dissection ([place]) 01/2017 s/p emergent repair @ [facility]. Cardiac pacemaker in situ              03/2017 s/p MDT [place] (ser# [id number] S). Chronic HFrEF (heart failure with reduced ejection fraction) ([place])         06/2016 Echo: EF 45%, diff HK, GrI DD; b. 03/2017 Echo: EF 55-60%, apical  wma, Gr2 DD; c. 06/2017 Echo: EF 55-60%, apical, apical inf, and mid antsept wma; d. 07/2018 Echo: EF 45%, apical thinning & AK. Dil Asc Ao. Essential hypertension        LV (left ventricular) mural thrombus           06/2016 Echo: EF 45%, diff HK w/ thrombus (0.9 x 0.9 x.0.7cm), GrI DD, mild AI/MR. Mixed hyperlipidemia          PAD (peripheral artery disease) (HCC)      PAF (paroxysmal atrial fibrillation) (HCC)              CHA2DS2VASc = 5-->chronic eliquis. Paraplegia ([place])    Spinal cord ischemia and injury in the setting of aortic dissection in December 2018. Stroke [facility])        06/2016 Urinary incontinence           in setting of paraplegia - self catheterizes.  Significant Hospital Events: Including procedures, antibiotic start and stop dates in addition to other pertinent events   3/19: Admitted to PCU with sepsis secondary to obstructive uropathy, AKI with multiple electrolyte derangements and A-fib with RVR. 3/20: Nephrology consulted recs no indication for dialysis continue to with IV fluids 3/21: Urology consulted for worsening gross hematuria recommends manual irrigation of the Foley holding Eliquis and trending CBC. ID consulted for Klebsiella aeruginosa bacteremia  3/22: Rapid response called for acute respiratory distress and complaint of lower back pain.  Patient transferred to the ICU and emergently intubated for airway protection. 3/25 remains on vent   Interim History / Subjective:  Remains  on vent Remains Encephalopathic  Patient failing to wean from vent Patient with progressive renal failure Patient now DNR status awaiting family for comfort care measures   Objective   Blood pressure 112/67, pulse (!) 103, temperature (!) 97.4 F (36.3 C), temperature source Axillary, resp. rate 20, height 5\' 10"  (1.778 m), weight 83.3 kg, SpO2 100 %.    Vent Mode: PRVC FiO2 (%):  [35 %] 35 % Set Rate:  [22 bmp] 22 bmp Vt Set:  [500 mL] 500 mL PEEP:  [5 cmH20] 5  cmH20 Plateau Pressure:  [14 cmH20-18 cmH20] 18 cmH20   Intake/Output Summary (Last 24 hours) at 05/25/2022 0716 Last data filed at 05/25/2022 0600 Gross per 24 hour  Intake 1975.7 ml  Output 1075 ml  Net 900.7 ml    Filed Weights   05/23/22 0500 05/24/22 0500 05/25/22 0451  Weight: 81.8 kg 82.5 kg 83.3 kg       REVIEW OF SYSTEMS  PATIENT IS UNABLE TO PROVIDE COMPLETE REVIEW OF SYSTEMS DUE TO SEVERE CRITICAL ILLNESS   PHYSICAL EXAMINATION:  GENERAL:critically ill appearing, +resp distress EYES: Pupils equal, round, reactive to light.  No scleral icterus.  MOUTH: Moist mucosal membrane. INTUBATED NECK: Supple.  PULMONARY: Lungs clear to auscultation, +rhonchi, +wheezing CARDIOVASCULAR: S1 and S2.  Regular rate and rhythm GASTROINTESTINAL: Soft, nontender, -distended. Positive bowel sounds.  MUSCULOSKELETAL: No swelling, clubbing, or edema.  NEUROLOGIC: obtunded,sedated SKIN:multiple skin lesions, abrasions, wounds POA Capillary refill delayed  Pulses present bilaterally    Assessment & Plan:   67 year old male with a history of multiple medical problems including paraplegia secondary to spinal cord ischemia, HFrEF, afib, and LV thrombus who presented to the hospital with urinary retention and developed sepsis due to gram negative bacteremia. Patient's mental status worsened with hypotension and a disconjugate gaze with severe metabolic encephalopathy and was subsequently intubated for airway protection.  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 Patient with history of Afib and will be on anti-coagulation (switched from eliquis to heparin gtt). Repeat imaging of the chest shows the size of his aorta to be similar, less concerning for a ruptured aneurysm/worsening dissection. Poor kidney function precludes [place] from being able to administer contrast. He is on low dose nor-epinephrine with good response (not required when off prop). -goal MAP > 65 mmHg -heparin gtt for afib and history of LV thrombus   ACUTE KIDNEY INJURY/Renal Failure  Intake/Output Summary (Last 24 hours) at 05/25/2022 0716 Last data filed at 05/25/2022 0600 Gross per 24 hour  Intake 1975.7 ml  Output 1075 ml  Net 900.7 ml     INFECTIOUS DISEASE GRAM NEG BACTERMIA -continue antibiotics as prescribed -follow up cultures   ENDO - ICU hypoglycemic\Hyperglycemia protocol -check FSBS per protocol   GI GI PROPHYLAXIS as indicated  NUTRITIONAL STATUS DIET-->ON HOLD DID NOT TOLERATE TF's Constipation protocol as indicated   ELECTROLYTES -follow labs  as needed -replace as needed -pharmacy consultation and following    Best Practice (right click and "Reselect all SmartList Selections" daily)   Diet/type: tubefeeds DVT prophylaxis: systemic heparin GI prophylaxis: PPI Lines: Central line Foley:  Yes, and it is still needed Code Status:  DNR/DNI  Labs   CBC: Recent Labs  Lab 05/20/22 0912 05/21/22 0236 05/22/22 0417 05/23/22 0333 05/24/22 0734  WBC 19.0* 15.7* 21.6* 33.4* 49.7*  NEUTROABS  --  9.5*  --   --   --   HGB 10.8* 10.3* 9.2* 8.6* 8.0*  HCT 33.1* 31.8* 28.6* 26.3* 25.2*  MCV 90.9 92.2 93.2 94.3 95.8  PLT 191 217 198 180 208      Basic Metabolic Panel: Recent Labs  Lab 05/19/22 0930 05/20/22 0912 05/21/22 0236 05/21/22 0618 05/21/22 1528 05/22/22 0417 05/23/22 0333 05/23/22 1639 05/24/22 0013  NA 129*   < > 127* 129* 125* 125* 130* 127* 129*  K 3.2*   < > 2.9* 3.0* 3.5 3.5 3.6 3.3* 4.2  CL 102   < > 101 98 98 100 106 104 106  CO2 13*   < > 15* 16* 17* 16* 16* 16* 16*  GLUCOSE 69*   < > 124* 169* 116* 136* 117* 117* 125*  BUN 63*   < > 64* 66* 64* 69* 84* 94* 94*  CREATININE 4.22*   < > 4.49* 4.37* 4.56* 4.59* 4.51* 4.45* 4.59*  CALCIUM 7.5*   < > 6.9* 7.3* 7.2* 7.2* 7.5* 7.0* 7.0*  MG 1.8  --  2.5*  --   --  2.5* 2.4 2.4  --   PHOS  --   --   --  2.9  --  2.4* 1.6* 1.7* 2.1*   < > = values in this interval not displayed.    GFR: Estimated Creatinine Clearance: 16.8 mL/min (A) (by C-G formula based on SCr of 4.59 mg/dL (H)). Recent Labs  Lab 05/19/22 1409 05/20/22 0912 05/21/22 0236 05/21/22 0618 05/22/22 0417 05/23/22 0333 05/24/22 0734  WBC  --  19.0* 15.7*  --  21.6* 33.4* 49.7*  LATICACIDVEN 2.3* 1.4 2.9* 1.4  --   --   --      Liver Function Tests: Recent Labs  Lab 05/12/2022 1945 05/21/22 0236 05/21/22 0618  AST 64* 35  --   ALT 27 16  --   ALKPHOS 145* 98  --   BILITOT 1.8* 1.0  --   PROT 7.5 5.5*  --   ALBUMIN 2.6* 1.8* 1.9*    No results for input(s): "LIPASE", "AMYLASE" in the last 168 hours. No results for input(s): "AMMONIA" in the last 168 hours.  ABG    Component Value Date/Time   PHART 7.34 (L) 05/22/2022 0853   PCO2ART 27 (L) 05/22/2022 0853   PO2ART 134 (H) 05/22/2022 0853   HCO3 15.3 (L) 05/23/2022 0920   ACIDBASEDEF 12.3 (H) 05/23/2022 0920   O2SAT 47.5 05/23/2022 0920     Coagulation Profile: Recent Labs  Lab 05/19/22 0328 05/21/22 1236  INR 2.7* 2.1*     Cardiac Enzymes: No results for input(s): "CKTOTAL", "CKMB", "CKMBINDEX", "TROPONINI" in the last 168 hours.  HbA1C: Hgb A1c MFr Bld  Date/Time Value Ref Range Status  07/20/2016  06:38 PM 5.7 (H) 4.8 - 5.6 % Final    Comment:             Pre-diabetes: 5.7 - 6.4          Diabetes: >6.4  Glycemic control for adults with diabetes: <7.0     CBG: Recent Labs  Lab 05/24/22 0726 05/24/22 1131 05/24/22 1647 05/24/22 1922 05/24/22 2352  GLUCAP 104* 136* 100* 82 68*       DVT/GI PRX  assessed I Assessed the need for Labs I Assessed the need for Foley I Assessed the need for Central Venous Line Family Discussion when available I Assessed the need for Mobilization I made an Assessment of medications to be adjusted accordingly Safety Risk assessment completed  CASE DISCUSSED IN MULTIDISCIPLINARY ROUNDS WITH ICU TEAM     Critical Care Time devoted to patient care services described in this note is 65 minutes.  Critical care was necessary to treat /prevent imminent and life-threatening deterioration. Overall, patient is critically ill, prognosis is guarded.  Patient with Multiorgan failure and at high risk for cardiac arrest and death.    Corrin Parker, M.D.  [facility]  Medical Director [place] Director [facility] Cardio-Pulmonary Department

## 2022-05-25 NOTE — Progress Notes (Signed)
  Chaplain On-Call received a call from the patient's Nurse Noel Gerold, who reported that she would like for the Chaplain to be present at 38 when the patient's family will be here for terminal extubation.  Chaplain arrived on the Unit at 1105, and was told by [place] NP Stanton Kidney that the family had told the Staff  yesterday that they did not want the Chaplain to be present.  Chaplain exited the Unit after being told this by [facility].  Chaplain Pollyann Samples M.Div., [facility]

## 2022-05-25 NOTE — Progress Notes (Signed)
Patient extubated per MD orders for comfort care measures.

## 2022-05-26 ENCOUNTER — Ambulatory Visit: Payer: Medicaid Other | Admitting: Cardiology

## 2022-05-26 DIAGNOSIS — A419 Sepsis, unspecified organism: Secondary | ICD-10-CM | POA: Diagnosis not present

## 2022-05-26 DIAGNOSIS — R0609 Other forms of dyspnea: Secondary | ICD-10-CM | POA: Diagnosis not present

## 2022-05-26 DIAGNOSIS — Z515 Encounter for palliative care: Secondary | ICD-10-CM | POA: Diagnosis not present

## 2022-05-26 DIAGNOSIS — R6520 Severe sepsis without septic shock: Secondary | ICD-10-CM | POA: Diagnosis not present

## 2022-05-26 LAB — GLUCOSE, CAPILLARY: Glucose-Capillary: 76 mg/dL (ref 70–99)

## 2022-05-26 LAB — CULTURE, BLOOD (ROUTINE X 2)
Culture: NO GROWTH
Culture: NO GROWTH

## 2022-05-26 LAB — CULTURE, RESPIRATORY W GRAM STAIN: Culture: NORMAL

## 2022-05-26 NOTE — Progress Notes (Signed)
Patient ID: [name], male   DOB: May 05, 1955, 67 y.o.   MRN: [id number]    Progress Note from the Palliative Medicine Team at [facility]   Patient Name: [place]        Date: 05/26/2022 DOB: November 04, 1955  Age: 67 y.o. MRN#: [id number] Attending Physician: Gwynne Edinger, MD Primary Care Physician: Donnie Coffin, MD Admit Date: 05/21/2022   Medical records reviewed   67 y.o. 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  ED report; patient was found to be in A-fib with RVR with heart rate in the [id number] and systolic blood pressure in the 80s.  Patient was complaining of shortness of breath and dizziness.     3/19: Admitted to PCU with sepsis secondary to obstructive uropathy, AKI with multiple electrolyte derangements and A-fib with RVR. 3/20: Nephrology consulted recs no indication for dialysis continue to with IV fluids 3/21: Urology consulted for worsening gross hematuria recommends manual irrigation of the Foley holding Eliquis and trending CBC. ID consulted for Klebsiella aeruginosa bacteremia  3/22: Rapid response called for acute respiratory distress and complaint of lower back pain.  Patient transferred to the ICU and emergently intubated for airway protection. 3/25 remains on vent-- Dr Mortimer Fries updated family on medical situation  05-25-22 decision for one way extubation and focus is comfort and dignity allowing for a natural death   Decision made yesterday for  one-way  extubation and shift to a full comfort path allowing for natural death.  Focus of care is comfort, quality, dignity allowing for natural death  Assessed patient at bedside,  [name] appears comfortable, extremities are cool and he is having periods of apnea.  Plan of care -DNR/DNI -No artificial feeding or hydration now or in the future -No further diagnostics or life-prolonging measures -Symptom management      -Pain/ dyspnea: Initiate morphine drip with bolus option      -Agitation: Ativan      -Terminal secretions: Robinul -Prognosis is likely hours to days   Attempted to call patient's sister [name] to give medical update, I was unable to reach  and mailbox was full   Time: 25 minutes  PMT will continue to support holistically  Detailed review of medical records, medically appropriate exam,   discussed with treatment team, counseling and education to patient, family, staff, documenting clinical information, medication management, coordination of care    Wadie Lessen NP  Palliative Medicine Team Team Phone # [phone] Pager [phone]

## 2022-05-26 NOTE — Progress Notes (Signed)
Pt transferred to Room 122 at this time with RN x 1 and Tech x 1. CCMD called and notified that patient is transferring at this time.

## 2022-05-26 NOTE — Progress Notes (Signed)
PROGRESS NOTE    [place]  [id number] DOB: [birth year] DOA: 05/20/2022 PCP: Donnie Coffin, MD     Assessment & Plan:   Principal Problem:   Severe sepsis ([place]) Active Problems:   AKI (acute kidney injury) ([place])   Hypokalemia   Neurogenic bladder   Hematuria   Chronic complete paraplegia ([facility])   Metabolic acidosis   Hyponatremia   Elevated troponin   Atrial fibrillation with RVR ([place])   Hypertension   Hyperlipidemia   Atrophic kidney   Urinary retention   Acute hypoxic respiratory failure ([facility])   Toxic metabolic encephalopathy   Bacteremia   Hx aortic dissection s/p emergent repair with resulting spinal cord ischemia and paraplegia, also htn, hfref, a-fib on apixaban, pacemaker status, LV thrombus, chronic urinary retention, who presents with urinary retention. Found to be in a fib with rvr, sepsis from obstructive uropathy, AKI, multiple electrolyte derangements. Hospital course complicated by gross hematuria treated with holding apixaban and manual bladder irrigation. Klebsiella bacteremia diagnosed. Nephrology, urology, ID all followed. Rapid response on 3/22 for respiratory distress. Intubated for airway protection. Worsening encephalopathy and unable to wean from vent. After discussion with family, one-way extubation on 3/26 and transitioned to full comfort care. Palliative following, anticipate in-hospital death.   Code Status: DNR Family Communication: sister updated @ bedside  Level of care: Med-Surg Status is: Inpatient Remains inpatient appropriate because: severity of illness    Consultants:  palliative  Procedures: intubation  Antimicrobials:  S/p abx    Subjective: asleep  Objective: Vitals:   05/26/22 0300 05/26/22 0400 05/26/22 0500 05/26/22 0806  BP:    (!) 68/44  Pulse: 91 89 87 80  Resp: 16 16 16 16   Temp:      TempSrc:      SpO2: 96% 96% 96% (!) 86%  Weight:      Height:        Intake/Output Summary (Last 24  hours) at 05/26/2022 1512 Last data filed at 05/26/2022 0700 Gross per 24 hour  Intake 84.94 ml  Output 1005 ml  Net -920.06 ml   Filed Weights   05/23/22 0500 05/24/22 0500 05/25/22 0451  Weight: 81.8 kg 82.5 kg 83.3 kg    Examination:  General exam: Appears calm and comfortable, chronically ill appearing Respiratory system: rr normal Central nervous system: asleep    Data Reviewed: I have personally reviewed following labs and imaging studies  CBC: Recent Labs  Lab 05/20/22 0912 05/21/22 0236 05/22/22 0417 05/23/22 0333 05/24/22 0734  WBC 19.0* 15.7* 21.6* 33.4* 49.7*  NEUTROABS  --  9.5*  --   --   --   HGB 10.8* 10.3* 9.2* 8.6* 8.0*  HCT 33.1* 31.8* 28.6* 26.3* 25.2*  MCV 90.9 92.2 93.2 94.3 95.8  PLT 191 217 198 180 [id number]   Basic Metabolic Panel: Recent Labs  Lab 05/21/22 0236 05/21/22 0618 05/21/22 1528 05/22/22 0417 05/23/22 0333 05/23/22 1639 05/24/22 0013  NA 127* 129* 125* 125* 130* 127* 129*  K 2.9* 3.0* 3.5 3.5 3.6 3.3* 4.2  CL 101 98 98 100 106 104 106  CO2 15* 16* 17* 16* 16* 16* 16*  GLUCOSE 124* 169* 116* 136* 117* 117* 125*  BUN 64* 66* 64* 69* 84* 94* 94*  CREATININE 4.49* 4.37* 4.56* 4.59* 4.51* 4.45* 4.59*  CALCIUM 6.9* 7.3* 7.2* 7.2* 7.5* 7.0* 7.0*  MG 2.5*  --   --  2.5* 2.4 2.4  --   PHOS  --  2.9  --  2.4*  1.6* 1.7* 2.1*   GFR: Estimated Creatinine Clearance: 16.8 mL/min (A) (by C-G formula based on SCr of 4.59 mg/dL (H)). Liver Function Tests: Recent Labs  Lab 05/21/22 0236 05/21/22 0618  AST 35  --   ALT 16  --   ALKPHOS 98  --   BILITOT 1.0  --   PROT 5.5*  --   ALBUMIN 1.8* 1.9*   No results for input(s): "LIPASE", "AMYLASE" in the last 168 hours. No results for input(s): "AMMONIA" in the last 168 hours. Coagulation Profile: Recent Labs  Lab 05/21/22 1236  INR 2.1*   Cardiac Enzymes: No results for input(s): "CKTOTAL", "CKMB", "CKMBINDEX", "TROPONINI" in the last 168 hours. BNP (last 3 results) No results for  input(s): "PROBNP" in the last 8760 hours. HbA1C: No results for input(s): "HGBA1C" in the last 72 hours. CBG: Recent Labs  Lab 05/24/22 1647 05/24/22 1922 05/24/22 2352 05/25/22 0436 05/25/22 0743  GLUCAP 100* 82 68* 76 75   Lipid Profile: Recent Labs    05/25/22 0447  TRIG 140   Thyroid Function Tests: No results for input(s): "TSH", "T4TOTAL", "FREET4", "T3FREE", "THYROIDAB" in the last 72 hours. Anemia Panel: No results for input(s): "VITAMINB12", "FOLATE", "FERRITIN", "TIBC", "IRON", "RETICCTPCT" in the last 72 hours. Urine analysis:    Component Value Date/Time   COLORURINE YELLOW (A) 05/22/2022 2019   APPEARANCEUR TURBID (A) 05/17/2022 2019   LABSPEC 1.008 05/13/2022 2019   PHURINE 7.0 05/28/2022 2019   GLUCOSEU NEGATIVE 05/23/2022 2019   HGBUR LARGE (A) 05/19/2022 2019   BILIRUBINUR NEGATIVE 05/19/2022 2019   KETONESUR NEGATIVE 05/17/2022 2019   PROTEINUR 100 (A) 05/30/2022 2019   NITRITE NEGATIVE 05/13/2022 2019   LEUKOCYTESUR LARGE (A) 05/28/2022 2019   Sepsis Labs: @LABRCNTIP (procalcitonin:4,lacticidven:4)  ) Recent Results (from the past 240 hour(s))  Urine Culture     Status: Abnormal   Collection Time: 05/08/2022  8:19 PM   Specimen: Urine, Catheterized  Result Value Ref Range Status   Specimen Description   Final    URINE, CATHETERIZED Performed at [facility], [address] [place], [place] [address]    Special Requests   Final    NONE Performed at [facility], [address] [place], [place] [address]    Culture (A)  Final    >=100,000 COLONIES/mL PSEUDOMONAS AERUGINOSA >=100,000 COLONIES/mL ENTEROBACTER AEROGENES 80,000 COLONIES/mL ENTEROCOCCUS FAECALIS    Report Status 05/24/2022 FINAL  Final   Organism ID, Bacteria ENTEROBACTER AEROGENES (A)  Final   Organism ID, Bacteria PSEUDOMONAS AERUGINOSA (A)  Final   Organism ID, Bacteria ENTEROCOCCUS FAECALIS (A)  Final      Susceptibility   Enterobacter aerogenes -  MIC*    CEFEPIME <=0.12 SENSITIVE Sensitive     CEFTRIAXONE <=0.25 SENSITIVE Sensitive     CIPROFLOXACIN <=0.25 SENSITIVE Sensitive     GENTAMICIN <=1 SENSITIVE Sensitive     IMIPENEM 1 SENSITIVE Sensitive     NITROFURANTOIN 64 INTERMEDIATE Intermediate     TRIMETH/SULFA <=20 SENSITIVE Sensitive     PIP/TAZO <=4 SENSITIVE Sensitive     * >=100,000 COLONIES/mL ENTEROBACTER AEROGENES   Enterococcus faecalis - MIC*    AMPICILLIN <=2 SENSITIVE Sensitive     NITROFURANTOIN <=16 SENSITIVE Sensitive     VANCOMYCIN <=0.5 SENSITIVE Sensitive     * 80,000 COLONIES/mL ENTEROCOCCUS FAECALIS   Pseudomonas aeruginosa - MIC*    CEFTAZIDIME <=1 SENSITIVE Sensitive     CIPROFLOXACIN <=0.25 SENSITIVE Sensitive     GENTAMICIN <=1 SENSITIVE Sensitive  IMIPENEM 2 SENSITIVE Sensitive     PIP/TAZO <=4 SENSITIVE Sensitive     CEFEPIME 0.5 SENSITIVE Sensitive     * >=100,000 COLONIES/mL PSEUDOMONAS AERUGINOSA  Culture, blood (Routine X 2) w Reflex to ID Panel     Status: Abnormal   Collection Time: 05/21/2022 10:41 PM   Specimen: BLOOD RIGHT FOREARM  Result Value Ref Range Status   Specimen Description   Final    BLOOD RIGHT FOREARM Performed at [facility], [place] [address] [place], [place] [address]    Special Requests   Final    BOTTLES DRAWN AEROBIC AND ANAEROBIC Blood Culture results may not be optimal due to an inadequate volume of blood received in culture bottles Performed at [facility], [address] [place], [place] [address]    Culture  Setup Time   Final    GRAM NEGATIVE RODS IN BOTH AEROBIC AND ANAEROBIC BOTTLES Organism ID to follow CRITICAL RESULT CALLED TO, READ BACK BY AND VERIFIED WITHAubery Lapping PHARMD 1152 05/19/22 HNM Performed at [facility], [address] [place], [place] [address]    Culture ENTEROBACTER AEROGENES (A)  Final   Report Status 05/22/2022 FINAL  Final   Organism ID, Bacteria ENTEROBACTER AEROGENES  Final      Susceptibility    Enterobacter aerogenes - MIC*    CEFEPIME <=0.12 SENSITIVE Sensitive     CEFTAZIDIME <=1 SENSITIVE Sensitive     CEFTRIAXONE <=0.25 SENSITIVE Sensitive     CIPROFLOXACIN <=0.25 SENSITIVE Sensitive     GENTAMICIN <=1 SENSITIVE Sensitive     IMIPENEM 2 SENSITIVE Sensitive     TRIMETH/SULFA <=20 SENSITIVE Sensitive     PIP/TAZO <=4 SENSITIVE Sensitive     * ENTEROBACTER AEROGENES  Blood Culture ID Panel (Reflexed)     Status: Abnormal   Collection Time: 05/06/2022 10:41 PM  Result Value Ref Range Status   Enterococcus faecalis NOT DETECTED NOT DETECTED Final   Enterococcus Faecium NOT DETECTED NOT DETECTED Final   Listeria monocytogenes NOT DETECTED NOT DETECTED Final   Staphylococcus species NOT DETECTED NOT DETECTED Final   Staphylococcus aureus (BCID) NOT DETECTED NOT DETECTED Final   Staphylococcus epidermidis NOT DETECTED NOT DETECTED Final   Staphylococcus lugdunensis NOT DETECTED NOT DETECTED Final   Streptococcus species NOT DETECTED NOT DETECTED Final   Streptococcus agalactiae NOT DETECTED NOT DETECTED Final   Streptococcus pneumoniae NOT DETECTED NOT DETECTED Final   Streptococcus pyogenes NOT DETECTED NOT DETECTED Final   A.calcoaceticus-baumannii NOT DETECTED NOT DETECTED Final   Bacteroides fragilis NOT DETECTED NOT DETECTED Final   Enterobacterales DETECTED (A) NOT DETECTED Final    Comment: Enterobacterales represent a large order of gram negative bacteria, not a single organism. CRITICAL RESULT CALLED TO, READ BACK BY AND VERIFIED WITH: JUSTIN MILLER [id number] 05/19/22 HNM    Enterobacter cloacae complex NOT DETECTED NOT DETECTED Final   Escherichia coli NOT DETECTED NOT DETECTED Final   Klebsiella aerogenes DETECTED (A) NOT DETECTED Final    Comment: CRITICAL RESULT CALLED TO, READ BACK BY AND VERIFIED WITH: JUSTIN MILLER [id number] 05/19/22 HNM    Klebsiella oxytoca NOT DETECTED NOT DETECTED Final   Klebsiella pneumoniae NOT DETECTED NOT DETECTED Final    Proteus species NOT DETECTED NOT DETECTED Final   Salmonella species NOT DETECTED NOT DETECTED Final   Serratia marcescens NOT DETECTED NOT DETECTED Final   Haemophilus influenzae NOT DETECTED NOT DETECTED Final   Neisseria meningitidis NOT DETECTED NOT DETECTED Final   Pseudomonas  aeruginosa NOT DETECTED NOT DETECTED Final   Stenotrophomonas maltophilia NOT DETECTED NOT DETECTED Final   Candida albicans NOT DETECTED NOT DETECTED Final   Candida auris NOT DETECTED NOT DETECTED Final   Candida glabrata NOT DETECTED NOT DETECTED Final   Candida krusei NOT DETECTED NOT DETECTED Final   Candida parapsilosis NOT DETECTED NOT DETECTED Final   Candida tropicalis NOT DETECTED NOT DETECTED Final   Cryptococcus neoformans/gattii NOT DETECTED NOT DETECTED Final   CTX-M ESBL NOT DETECTED NOT DETECTED Final   Carbapenem resistance IMP NOT DETECTED NOT DETECTED Final   Carbapenem resistance KPC NOT DETECTED NOT DETECTED Final   Carbapenem resistance NDM NOT DETECTED NOT DETECTED Final   Carbapenem resist OXA 48 LIKE NOT DETECTED NOT DETECTED Final   Carbapenem resistance VIM NOT DETECTED NOT DETECTED Final    Comment: Performed at [facility], [address] [place], [place] [address]  Culture, blood (Routine X 2) w Reflex to ID Panel     Status: None   Collection Time: 05/19/22  3:54 AM   Specimen: BLOOD  Result Value Ref Range Status   Specimen Description BLOOD LEFT HAND  Final   Special Requests   Final    BOTTLES DRAWN AEROBIC AND ANAEROBIC Blood Culture results may not be optimal due to an inadequate volume of blood received in culture bottles   Culture   Final    NO GROWTH 5 DAYS Performed at [facility], [address], [place], [place] [address]    Report Status 05/24/2022 FINAL  Final  MRSA Next Gen by PCR, Nasal     Status: None   Collection Time: 05/21/22  3:22 AM   Specimen: Nasal Mucosa; Nasal Swab  Result Value Ref Range Status   MRSA by PCR Next Gen NOT  DETECTED NOT DETECTED Final    Comment: (NOTE) The GeneXpert MRSA Assay (FDA approved for NASAL specimens only), is one component of a comprehensive MRSA colonization surveillance program. It is not intended to diagnose MRSA infection nor to guide or monitor treatment for MRSA infections. Test performance is not FDA approved in patients less than 47 years old. Performed at [facility], [address] [place], [place] [address]   Culture, blood (Routine X 2) w Reflex to ID Panel     Status: None   Collection Time: 05/21/22  4:00 PM   Specimen: BLOOD  Result Value Ref Range Status   Specimen Description BLOOD BLOOD LEFT HAND  Final   Special Requests   Final    BOTTLES DRAWN AEROBIC AND ANAEROBIC Blood Culture results may not be optimal due to an inadequate volume of blood received in culture bottles   Culture   Final    NO GROWTH 5 DAYS Performed at [facility], [address] [place], [place] [address]    Report Status 05/26/2022 FINAL  Final  Culture, blood (Routine X 2) w Reflex to ID Panel     Status: None   Collection Time: 05/21/22  5:49 PM   Specimen: BLOOD  Result Value Ref Range Status   Specimen Description BLOOD BLOOD LEFT HAND  Final   Special Requests   Final    BOTTLES DRAWN AEROBIC AND ANAEROBIC Blood Culture results may not be optimal due to an inadequate volume of blood received in culture bottles   Culture   Final    NO GROWTH 5 DAYS Performed at [facility], [address], [place], [place] [address]    Report  Status 05/26/2022 FINAL  Final  Culture, Respiratory w Gram Stain     Status: None   Collection Time: 05/23/22  9:57 AM   Specimen: Tracheal Aspirate; Respiratory  Result Value Ref Range Status   Specimen Description   Final    TRACHEAL ASPIRATE Performed at [facility], [address] [place], [place] [address]    Special Requests   Final    NONE Performed at [facility], [address] [place], [place] [address]    Gram Stain   Final    RARE WBC PRESENT, PREDOMINANTLY PMN RARE SQUAMOUS EPITHELIAL CELLS PRESENT RARE GRAM NEGATIVE RODS RARE YEAST    Culture   Final    RARE Normal respiratory flora-no Staph aureus or Pseudomonas seen Performed at [facility] [address] [place], [place] [address]    Report Status 05/26/2022 FINAL  Final  C Difficile Quick Screen (NO PCR Reflex)     Status: None   Collection Time: 05/23/22  4:39 PM   Specimen: STOOL  Result Value Ref Range Status   C Diff antigen NEGATIVE NEGATIVE Final   C Diff toxin NEGATIVE NEGATIVE Final   C Diff interpretation No C. difficile detected.  Final    Comment: Performed at [facility], [address] [place], [place] [address]         Radiology Studies: No results found.      Scheduled Meds:  sodium chloride   Intravenous Once   glycopyrrolate  0.4 mg Intravenous QID   Continuous Infusions:  sodium chloride     morphine 5 mg/hr (05/26/22 0545)     LOS: 8 days     Desma Maxim, MD [facility]   If 7PM-7AM, please contact night-coverage [email or web address] Password [email or web address] 05/26/2022, 3:12 PM

## 2022-05-26 NOTE — Progress Notes (Signed)
Notified emergency contact, sister, Reita May of patient's transfer to 1C, room 122. Provided update of patient status. No questions from sister.

## 2022-05-27 DIAGNOSIS — R6520 Severe sepsis without septic shock: Secondary | ICD-10-CM | POA: Diagnosis not present

## 2022-05-27 DIAGNOSIS — A419 Sepsis, unspecified organism: Secondary | ICD-10-CM | POA: Diagnosis not present

## 2022-05-31 NOTE — Death Summary Note (Signed)
DEATH SUMMARY   Patient Details  Name: Mitchell Dixon MRN: [id number] DOB: [birth year] [id number], Mitchell Lynch, MD Admission/Discharge Information   Admit Date:  Jun 05, 2022  Date of Death: Date of Death: 14-Jun-2022  Time of Death: Time of Death: 0835  Length of Stay: 9   Principle Cause of death: septic shock  Hospital Diagnoses: Principal Problem:   Severe sepsis ([place]) Active Problems:   AKI (acute kidney injury) ([facility])   Hypokalemia   Neurogenic bladder   Hematuria   Chronic complete paraplegia ([facility])   Metabolic acidosis   Hyponatremia   Elevated troponin   Atrial fibrillation with RVR ([place])   Hypertension   Hyperlipidemia   Atrophic kidney   Urinary retention   Acute hypoxic respiratory failure ([place])   Toxic metabolic encephalopathy   Bacteremia   Hospital Course: Hx aortic dissection s/p emergent repair with resulting spinal cord ischemia and paraplegia, also htn, hfref, a-fib on apixaban, pacemaker status, LV thrombus, chronic urinary retention, who presents with urinary retention. Found to be in a fib with rvr, sepsis from obstructive uropathy, AKI, multiple electrolyte derangements. Hospital course complicated by gross hematuria treated with holding apixaban and manual bladder irrigation. Klebsiella bacteremia diagnosed. Nephrology, urology, ID all involved in care. Rapid response on 3/22 for respiratory distress. Intubated for airway protection. Followed by worsening encephalopathy and inability to wean from vent. After discussion with family, one-way extubation on 3/26 and transitioned to full comfort care. Expired the morning of 2022/06/14.        Procedures: intubation, central venous catheter insertion   The results of significant diagnostics from this hospitalization (including imaging, microbiology, ancillary and laboratory) are listed below for reference.   Significant Diagnostic Studies: US Venous Img Lower Bilateral (DVT)  Result Date:  05/22/2022 CLINICAL DATA:  Bilateral swelling EXAM: BILATERAL LOWER EXTREMITY VENOUS DOPPLER ULTRASOUND TECHNIQUE: Gray-scale sonography with compression, as well as color and duplex ultrasound, were performed to evaluate the deep venous system(s) from the level of the common femoral vein through the popliteal and proximal calf veins. COMPARISON:  None Available. FINDINGS: VENOUS Normal compressibility of the common femoral, superficial femoral, and popliteal veins, as well as the visualized calf veins. Visualized portions of profunda femoral vein and great saphenous vein unremarkable. No filling defects to suggest DVT on grayscale or color Doppler imaging. Doppler waveforms show normal direction of venous flow, normal respiratory plasticity and response to augmentation. OTHER None. Limitations: none IMPRESSION: Negative. Electronically Signed   By: Lucrezia Europe M.D.   On: 05/22/2022 14:17   DG Chest Port 1 View  Result Date: 05/22/2022 CLINICAL DATA:  67 year old male with aortic dissection status post graft repair of ascending aorta. Respiratory failure. EXAM: PORTABLE CHEST 1 VIEW COMPARISON:  Portable chest 05/21/2022 and earlier. FINDINGS: Portable AP semi upright view at 0857 hours. Stable lines and tubes. Slightly lower lung volumes. Stable cardiac size and mediastinal contours. Small left pleural effusion. No superimposed pneumothorax, pulmonary edema, right pleural effusion or new pulmonary opacity. Prior sternotomy. Stable visualized osseous structures. IMPRESSION: 1. Stable lines and tubes. 2. Ongoing small left pleural effusion. No new cardiopulmonary abnormality. Electronically Signed   By: Genevie Ann M.D.   On: 05/22/2022 09:04   EEG adult  Result Date: 05/21/2022 Lora Havens, MD     05/21/2022  2:07 PM Patient Name: Mitchell Dixon MRN: [id number] Epilepsy Attending: Lora Havens Referring Physician/Provider: Bradly Bienenstock, NP Date: 05/21/2022 Duration: 23.40 mins Patient  history: 67yo M getting EEG to evaluate  for seizure. Level of alertness:  comatose AEDs during EEG study: Propofol Technical aspects: This EEG study was done with scalp electrodes positioned according to the 10-20 International system of electrode placement. Electrical activity was reviewed with band pass filter of 1-70Hz , sensitivity of 7 uV/mm, display speed of 62mm/sec with a 60Hz  notched filter applied as appropriate. EEG data were recorded continuously and digitally stored.  Video monitoring was available and reviewed as appropriate. Description: EEG showed continuous generalized 3 to 6 Hz theta-delta slowing with overriding 12-15hz  beta activity distributed symmetrically and diffusely.  Hyperventilation and photic stimulation were not performed.   ABNORMALITY - Continuous slow, generalized IMPRESSION: This study is suggestive of moderate to severe diffuse encephalopathy, nonspecific etiology but likely related to sedation. No seizures or epileptiform discharges were seen throughout the recording. Lora Havens   DG Chest Port 1 View  Result Date: 05/21/2022 CLINICAL DATA:  History of endotracheal tube. EXAM: PORTABLE CHEST 1 VIEW COMPARISON:  04/20/2022 at 0225 hours. FINDINGS: 0917 hours. Endotracheal tube tip projects over the midthoracic trachea. Enteric tube is coiled within the proximal stomach. Interval placement of a right IJ approach central venous catheter tip projects over the mid SVC. Defibrillator pads overlie the patient. No focal airspace opacity. Normal heart size. Stable mediastinal contours with prominence of the aortic knob. No pleural effusion or pneumothorax. IMPRESSION: 1. Interval placement of a right IJ approach central venous catheter with tip projecting over the mid SVC. Otherwise stable support apparatus. 2.  No evidence of acute cardiopulmonary disease. Electronically Signed   By: Emmit Alexanders M.D.   On: 05/21/2022 09:26   CT CHEST ABDOMEN PELVIS WO CONTRAST  Result  Date: 05/21/2022 CLINICAL DATA:  67 year old male with history of thoracic aortic dissection. Follow-up study. EXAM: CT CHEST, ABDOMEN AND PELVIS WITHOUT CONTRAST TECHNIQUE: Multidetector CT imaging of the chest, abdomen and pelvis was performed following the standard protocol without IV contrast. RADIATION DOSE REDUCTION: This exam was performed according to the departmental dose-optimization program which includes automated exposure control, adjustment of the mA and/or kV according to patient size and/or use of iterative reconstruction technique. COMPARISON:  CT of the chest, abdomen and pelvis 03/20/2022. FINDINGS: CT CHEST FINDINGS Cardiovascular: Heart size is normal. There is no significant pericardial fluid, thickening or pericardial calcification. There is aortic atherosclerosis, as well as atherosclerosis of the great vessels of the mediastinum and the coronary arteries, including calcified atherosclerotic plaque in the left anterior descending, left circumflex and right coronary arteries. Status post median sternotomy for graft repair of the ascending thoracic aorta. Chronic thoracic aortic aneurysm/dissection again noted, with the mid arch measuring up to 5.2 cm in diameter, and the descending thoracic aorta measuring up to 4.6 cm in diameter, similar to the prior study. Right internal jugular central venous catheter with tip terminating in the distal superior vena cava. Electronic device in the right ventricular apex, presumably an implantable cardiac pacemaker. Mediastinum/Nodes: No high attenuation fluid collection in the mediastinum to suggest mediastinal hematoma. No pathologically enlarged mediastinal or hilar lymph nodes. Nasogastric tube extending into the stomach. Esophagus is otherwise unremarkable in appearance. No axillary lymphadenopathy. Lungs/Pleura: Small left pleural effusion (low-attenuation) lying dependently. No right pleural effusion. Dependent subsegmental atelectasis in the left  lower lobe. No consolidative airspace disease. Diffuse bronchial wall thickening with moderate centrilobular and paraseptal emphysema. No definite suspicious appearing pulmonary nodules or masses are noted. Low lying endotracheal tube with tip in the proximal right mainstem bronchus. Musculoskeletal: Median sternotomy wires. There are no aggressive  appearing lytic or blastic lesions noted in the visualized portions of the skeleton. CT ABDOMEN PELVIS FINDINGS Hepatobiliary: No definite suspicious cystic or solid hepatic lesions are confidently identified on today's noncontrast CT examination. Unenhanced appearance of the gallbladder is unremarkable. Pancreas: No definite pancreatic mass or peripancreatic fluid collections or inflammatory changes are noted on today's noncontrast CT examination. Spleen: Unremarkable. Adrenals/Urinary Tract: Severe atrophy of the right kidney. Nonobstructive calculi are noted in the left renal collecting system, largest of which measures up to 1.3 cm in the upper pole. No definite calculi are noted along the course of either ureter. No hydroureteronephrosis. Bilateral adrenal glands are normal in appearance. There are numerous large calculi noted within the lumen of the urinary bladder which is completely decompressed, with Foley balloon catheter in place. Small amount of gas non dependently in the lumen of the urinary bladder, presumably iatrogenic. There appears to be at least 1 calculus within the lumen of the Foley balloon catheter best appreciated on sagittal image 75 of series 6. Additionally, in the penile urethra (sagittal image 79 of series 6) there are small calculi which appear to be adjacent to the Foley catheter. Stomach/Bowel: Tip of nasogastric tube is in the antral pre-pyloric region of the stomach. Stomach is otherwise unremarkable in appearance. No pathologic dilatation of small bowel or colon. Normal appendix. Vascular/Lymphatic: Aortic atherosclerosis with chronic  dissection of the abdominal aorta poorly demonstrated on today's noncontrast CT examination. 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 Vascular stents are noted in the common iliac arteries bilaterally. No definite lymphadenopathy noted in the abdomen or pelvis. Reproductive: Prostate gland and seminal vesicles are unremarkable in appearance. Other: Trace volume of ascites. No pneumoperitoneum. Body wall edema, most evident in the region of the pelvis. Musculoskeletal: There are no aggressive appearing lytic or blastic lesions noted in the visualized portions of the skeleton. IMPRESSION: 1. Grossly unchanged chronic aortic aneurysm/dissection status post graft repair of the ascending thoracic aorta, as detailed above. 2. Low-lying endotracheal tube with tip in the right mainstem bronchus. Retraction of the tube approximately 5 cm for more optimal placement is recommended. 3. Multiple bladder calculi again noted. Bladder is decompressed secondary to indwelling Foley balloon catheter. There appears to be at least 1 calculus in the lumen of the Foley catheter, along with a cluster of several small calculi in the region of the penile urethra adjacent to the catheter, as above. Urologic consultation is recommended. 4. Nonobstructive calculi in the collecting system of the left kidney measuring up to 1.3 cm in the upper pole. No ureteral stones or hydroureteronephrosis. 5. New small left pleural effusion is simple in appearance lying dependently. 6. Severe atrophy of the right kidney again noted. 7. Additional incidental findings, as above. These results will be called to the ordering clinician or representative by the Radiologist Assistant, and communication documented in the PACS or [employer]. Electronically Signed   By: Vinnie Langton M.D.   On: 05/21/2022 08:28   CT HEAD WO CONTRAST (5MM)  Result Date: 05/21/2022 CLINICAL DATA:  67 year old male with  encephalopathy. On chronic anticoagulation. EXAM: CT HEAD WITHOUT CONTRAST TECHNIQUE: Contiguous axial images were obtained from the base of the skull through the vertex without intravenous contrast. RADIATION DOSE REDUCTION: This exam was performed according to the departmental dose-optimization program which includes automated exposure control, adjustment of the mA and/or kV according to patient size and/or use of iterative reconstruction technique. COMPARISON:  Brain MRI and Head  CT 07/03/2016. 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 Other bilateral patchy right frontal horn and bilateral external capsule region white matter hypodensity is stable, along with conspicuous bilateral anterior temporal lobe subcortical white matter hypodensity. Underlying cerebral volume remains normal. No midline shift, ventriculomegaly, mass effect, evidence of mass lesion, intracranial hemorrhage or evidence of cortically based acute infarction. Vascular: Mild Calcified atherosclerosis at the skull base. No suspicious intracranial vascular hyperdensity. Skull: No acute osseous abnormality identified. Sinuses/Orbits: Visualized paranasal sinuses and mastoids are stable and well aerated. Other: Visualized orbits and scalp soft tissues are within normal limits. Retained secretions in the posterior nasal cavity and visible nasopharynx. Patient is intubated on the scout view. IMPRESSION: 1. No acute intracranial abnormality. 2. 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 Electronically Signed   By: Genevie Ann M.D.   On: 05/21/2022 05:33   DG Abd 1 View  Result Date: 05/21/2022 CLINICAL DATA:  Nasogastric tube placement EXAM: ABDOMEN - 1 VIEW COMPARISON:   CT 03/20/2022 FINDINGS: Nasogastric tube tip is within the gastric fundus. Markedly dilated loop of bowel has developed within the mid abdomen, incompletely included as the pelvis is excluded from view. This may represent changes related to sigmoid or cecal volvulus. No free intraperitoneal gas. IMPRESSION: 1. Nasogastric tube tip within the gastric fundus. 2. Markedly dilated loop of bowel within the mid abdomen, incompletely included as the pelvis is excluded from view, raising the question of sigmoid or cecal volvulus. Correlation with clinical examination and possible CT imaging is recommended. Electronically Signed   By: Fidela Salisbury M.D.   On: 05/21/2022 02:49   DG Chest 1 View  Result Date: 05/21/2022 CLINICAL DATA:  Intubation, NG tube placement EXAM: CHEST  1 VIEW COMPARISON:  05/24/2022 FINDINGS: Endotracheal tube 2.7 cm above the carina. NG tube not visualized. Heart is normal size. Aneurysmal dilatation of the aorta as seen on prior CT. Lungs clear. No effusions or pneumothorax. No acute bony abnormality. IMPRESSION: Endotracheal tube 2.7 cm above the carina.  NG tube not visualized. Aneurysmal dilatation of the thoracic aorta as seen on prior CT. No acute cardiopulmonary disease. Electronically Signed   By: Rolm Baptise M.D.   On: 05/21/2022 02:48   US RENAL  Result Date: 05/19/2022 CLINICAL DATA:  Acute kidney insufficiency EXAM: RENAL / URINARY TRACT ULTRASOUND COMPLETE COMPARISON:  CT 03/20/2022 FINDINGS: Right Kidney: Renal measurements: 2.6 x 1.8 x 1.8 = volume: 4 mL. Extremely echogenic and atrophic. No collecting system dilatation. This was seen on the prior CT. Left Kidney: Renal measurements: 12.2 x 7.0 x 5.8 = volume: 260 mL. No collecting system dilatation. No perinephric fluid. Echogenic shadowing area identified consistent with known stone in the upper pole measuring 11 mm by ultrasound. Larger by CT at 13 mm. Bladder: Underdistended. Other: None. IMPRESSION: Severely atrophic  right kidney. Upper pole left-sided renal stone.  No collecting system dilatation. Electronically Signed   By: Jill Side M.D.   On: 05/19/2022 12:05   DG Chest 1 View  Result Date: 05/17/2022 CLINICAL DATA:  Atrial fibrillation. EXAM: CHEST  1 VIEW COMPARISON:  03/20/2022 FINDINGS: Prior median sternotomy. The heart is normal in size. 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 Lead less pacemaker in place. No  pulmonary edema, pleural effusion, or pneumothorax. On limited assessment, no acute osseous finding. IMPRESSION: No acute findings. Electronically Signed   By: Keith Rake M.D.   On: 05/12/2022 20:55    Microbiology: Recent Results (from the past 240 hour(s))  Urine Culture     Status: Abnormal   Collection Time: 05/16/2022  8:19 PM   Specimen: Urine, Catheterized  Result Value Ref Range Status   Specimen Description   Final    URINE, CATHETERIZED Performed at [facility], [address] [place], [place] [address]    Special Requests   Final    NONE Performed at [facility], [place], [place] [address]    Culture (A)  Final    >=100,000 COLONIES/mL PSEUDOMONAS AERUGINOSA >=100,000 COLONIES/mL ENTEROBACTER AEROGENES 80,000 COLONIES/mL ENTEROCOCCUS FAECALIS    Report Status 05/24/2022 FINAL  Final   Organism ID, Bacteria ENTEROBACTER AEROGENES (A)  Final   Organism ID, Bacteria PSEUDOMONAS AERUGINOSA (A)  Final   Organism ID, Bacteria ENTEROCOCCUS FAECALIS (A)  Final      Susceptibility   Enterobacter aerogenes - MIC*    CEFEPIME <=0.12 SENSITIVE Sensitive     CEFTRIAXONE <=0.25 SENSITIVE Sensitive     CIPROFLOXACIN <=0.25 SENSITIVE Sensitive     GENTAMICIN <=1 SENSITIVE Sensitive     IMIPENEM 1 SENSITIVE Sensitive     NITROFURANTOIN 64 INTERMEDIATE Intermediate     TRIMETH/SULFA <=20 SENSITIVE Sensitive     PIP/TAZO <=4 SENSITIVE Sensitive     * >=100,000  COLONIES/mL ENTEROBACTER AEROGENES   Enterococcus faecalis - MIC*    AMPICILLIN <=2 SENSITIVE Sensitive     NITROFURANTOIN <=16 SENSITIVE Sensitive     VANCOMYCIN <=0.5 SENSITIVE Sensitive     * 80,000 COLONIES/mL ENTEROCOCCUS FAECALIS   Pseudomonas aeruginosa - MIC*    CEFTAZIDIME <=1 SENSITIVE Sensitive     CIPROFLOXACIN <=0.25 SENSITIVE Sensitive     GENTAMICIN <=1 SENSITIVE Sensitive     IMIPENEM 2 SENSITIVE Sensitive     PIP/TAZO <=4 SENSITIVE Sensitive     CEFEPIME 0.5 SENSITIVE Sensitive     * >=100,000 COLONIES/mL PSEUDOMONAS AERUGINOSA  Culture, blood (Routine X 2) w Reflex to ID Panel     Status: Abnormal   Collection Time: 05/17/2022 10:41 PM   Specimen: BLOOD RIGHT FOREARM  Result Value Ref Range Status   Specimen Description   Final    BLOOD RIGHT FOREARM Performed at [facility], [address], [place], [place] [address]    Special Requests   Final    BOTTLES DRAWN AEROBIC AND ANAEROBIC Blood Culture results may not be optimal due to an inadequate volume of blood received in culture bottles Performed at [facility], [address] [place], [place] [address]    Culture  Setup Time   Final    GRAM NEGATIVE RODS IN BOTH AEROBIC AND ANAEROBIC BOTTLES Organism ID to follow CRITICAL RESULT CALLED TO, READ BACK BY AND VERIFIED WITHAubery Lapping PHARMD [id number] 05/19/22 HNM Performed at [facility], [address] [place],  [address]    Culture ENTEROBACTER AEROGENES (A)  Final   Report Status 05/22/2022 FINAL  Final   Organism ID, Bacteria ENTEROBACTER AEROGENES  Final      Susceptibility   Enterobacter aerogenes - MIC*    CEFEPIME <=0.12 SENSITIVE Sensitive     CEFTAZIDIME <=1 SENSITIVE Sensitive     CEFTRIAXONE <=0.25 SENSITIVE Sensitive     CIPROFLOXACIN <=0.25 SENSITIVE Sensitive     GENTAMICIN <=  1 SENSITIVE Sensitive     IMIPENEM 2 SENSITIVE Sensitive     TRIMETH/SULFA <=20 SENSITIVE Sensitive     PIP/TAZO <=4 SENSITIVE  Sensitive     * ENTEROBACTER AEROGENES  Blood Culture ID Panel (Reflexed)     Status: Abnormal   Collection Time: 05/26/2022 10:41 PM  Result Value Ref Range Status   Enterococcus faecalis NOT DETECTED NOT DETECTED Final   Enterococcus Faecium NOT DETECTED NOT DETECTED Final   Listeria monocytogenes NOT DETECTED NOT DETECTED Final   Staphylococcus species NOT DETECTED NOT DETECTED Final   Staphylococcus aureus (BCID) NOT DETECTED NOT DETECTED Final   Staphylococcus epidermidis NOT DETECTED NOT DETECTED Final   Staphylococcus lugdunensis NOT DETECTED NOT DETECTED Final   Streptococcus species NOT DETECTED NOT DETECTED Final   Streptococcus agalactiae NOT DETECTED NOT DETECTED Final   Streptococcus pneumoniae NOT DETECTED NOT DETECTED Final   Streptococcus pyogenes NOT DETECTED NOT DETECTED Final   A.calcoaceticus-baumannii NOT DETECTED NOT DETECTED Final   Bacteroides fragilis NOT DETECTED NOT DETECTED Final   Enterobacterales DETECTED (A) NOT DETECTED Final    Comment: Enterobacterales represent a large order of gram negative bacteria, not a single organism. CRITICAL RESULT CALLED TO, READ BACK BY AND VERIFIED WITH: JUSTIN MILLER [id number] 05/19/22 HNM    Enterobacter cloacae complex NOT DETECTED NOT DETECTED Final   Escherichia coli NOT DETECTED NOT DETECTED Final   Klebsiella aerogenes DETECTED (A) NOT DETECTED Final    Comment: CRITICAL RESULT CALLED TO, READ BACK BY AND VERIFIED WITH: JUSTIN MILLER [id number] 05/19/22 HNM    Klebsiella oxytoca NOT DETECTED NOT DETECTED Final   Klebsiella pneumoniae NOT DETECTED NOT DETECTED Final   Proteus species NOT DETECTED NOT DETECTED Final   Salmonella species NOT DETECTED NOT DETECTED Final   Serratia marcescens NOT DETECTED NOT DETECTED Final   Haemophilus influenzae NOT DETECTED NOT DETECTED Final   Neisseria meningitidis NOT DETECTED NOT DETECTED Final   Pseudomonas aeruginosa NOT DETECTED NOT DETECTED Final   Stenotrophomonas  maltophilia NOT DETECTED NOT DETECTED Final   Candida albicans NOT DETECTED NOT DETECTED Final   Candida auris NOT DETECTED NOT DETECTED Final   Candida glabrata NOT DETECTED NOT DETECTED Final   Candida krusei NOT DETECTED NOT DETECTED Final   Candida parapsilosis NOT DETECTED NOT DETECTED Final   Candida tropicalis NOT DETECTED NOT DETECTED Final   Cryptococcus neoformans/gattii NOT DETECTED NOT DETECTED Final   CTX-M ESBL NOT DETECTED NOT DETECTED Final   Carbapenem resistance IMP NOT DETECTED NOT DETECTED Final   Carbapenem resistance KPC NOT DETECTED NOT DETECTED Final   Carbapenem resistance NDM NOT DETECTED NOT DETECTED Final   Carbapenem resist OXA 48 LIKE NOT DETECTED NOT DETECTED Final   Carbapenem resistance VIM NOT DETECTED NOT DETECTED Final    Comment: Performed at [facility], [address] [place], [place] [address]  Culture, blood (Routine X 2) w Reflex to ID Panel     Status: None   Collection Time: 05/19/22  3:54 AM   Specimen: BLOOD  Result Value Ref Range Status   Specimen Description BLOOD LEFT HAND  Final   Special Requests   Final    BOTTLES DRAWN AEROBIC AND ANAEROBIC Blood Culture results may not be optimal due to an inadequate volume of blood received in culture bottles   Culture   Final    NO GROWTH 5 DAYS Performed at [facility], [address] [place], [place] [address]    Report Status 05/24/2022 FINAL  Final  MRSA Next Gen by PCR, Nasal     Status: None   Collection Time: 05/21/22  3:22 AM   Specimen: Nasal Mucosa; Nasal Swab  Result Value Ref Range Status   MRSA by PCR Next Gen NOT DETECTED NOT DETECTED Final    Comment: (NOTE) The GeneXpert MRSA Assay (FDA approved for NASAL specimens only), is one component of a comprehensive MRSA colonization surveillance program. It is not intended to diagnose MRSA infection nor to guide or monitor treatment for MRSA infections. Test performance is not FDA approved in patients less  than 33 years old. Performed at [facility], [address] [place], [place] [address]   Culture, blood (Routine X 2) w Reflex to ID Panel     Status: None   Collection Time: 05/21/22  4:00 PM   Specimen: BLOOD  Result Value Ref Range Status   Specimen Description BLOOD BLOOD LEFT HAND  Final   Special Requests   Final    BOTTLES DRAWN AEROBIC AND ANAEROBIC Blood Culture results may not be optimal due to an inadequate volume of blood received in culture bottles   Culture   Final    NO GROWTH 5 DAYS Performed at [facility], [address] [place], [place] [address]    Report Status 05/26/2022 FINAL  Final  Culture, blood (Routine X 2) w Reflex to ID Panel     Status: None   Collection Time: 05/21/22  5:49 PM   Specimen: BLOOD  Result Value Ref Range Status   Specimen Description BLOOD BLOOD LEFT HAND  Final   Special Requests   Final    BOTTLES DRAWN AEROBIC AND ANAEROBIC Blood Culture results may not be optimal due to an inadequate volume of blood received in culture bottles   Culture   Final    NO GROWTH 5 DAYS Performed at [facility], [address] [place], [place] [address]    Report Status 05/26/2022 FINAL  Final  Culture, Respiratory w Gram Stain     Status: None   Collection Time: 05/23/22  9:57 AM   Specimen: Tracheal Aspirate; Respiratory  Result Value Ref Range Status   Specimen Description   Final    TRACHEAL ASPIRATE Performed at [facility], [address], [place], [place] [address]    Special Requests   Final    NONE Performed at [facility], [address] [place], [place] [address]    Gram Stain   Final    RARE WBC PRESENT, PREDOMINANTLY PMN RARE SQUAMOUS EPITHELIAL CELLS PRESENT RARE GRAM NEGATIVE RODS RARE YEAST    Culture   Final    RARE Normal respiratory flora-no Staph aureus or Pseudomonas seen Performed at [facility] [address] [place], [place] [address]    Report  Status 05/26/2022 FINAL  Final  C Difficile Quick Screen (NO PCR Reflex)     Status: None   Collection Time: 05/23/22  4:39 PM   Specimen: STOOL  Result Value Ref Range Status   C Diff antigen NEGATIVE NEGATIVE Final   C Diff toxin NEGATIVE NEGATIVE Final   C Diff interpretation No C. difficile detected.  Final    Comment: Performed at [facility], [address], [place], [place] [address]    Time spent: 25 minutes  Signed: Desma Maxim, MD 05/21/2022

## 2022-05-31 DEATH — deceased

## 2022-06-10 ENCOUNTER — Ambulatory Visit: Payer: Medicaid Other | Admitting: Urology

## 2022-07-27 ENCOUNTER — Ambulatory Visit: Payer: Medicaid Other | Admitting: Cardiology
